# Patient Record
Sex: Female | Born: 1950 | Race: White | Hispanic: No | Smoking: Never smoker
Health system: Southern US, Community
[De-identification: ages and names within clinical notes are randomized; demographics above are authoritative.]

## PROBLEM LIST (undated history)

## (undated) DIAGNOSIS — F419 Anxiety disorder, unspecified: Secondary | ICD-10-CM

## (undated) DIAGNOSIS — D649 Anemia, unspecified: Secondary | ICD-10-CM

## (undated) DIAGNOSIS — I340 Nonrheumatic mitral (valve) insufficiency: Secondary | ICD-10-CM

## (undated) DIAGNOSIS — E119 Type 2 diabetes mellitus without complications: Secondary | ICD-10-CM

## (undated) DIAGNOSIS — K219 Gastro-esophageal reflux disease without esophagitis: Secondary | ICD-10-CM

## (undated) DIAGNOSIS — N183 Chronic kidney disease, stage 3 unspecified: Secondary | ICD-10-CM

## (undated) DIAGNOSIS — F32A Depression, unspecified: Secondary | ICD-10-CM

## (undated) DIAGNOSIS — IMO0001 Reserved for inherently not codable concepts without codable children: Secondary | ICD-10-CM

## (undated) DIAGNOSIS — I10 Essential (primary) hypertension: Secondary | ICD-10-CM

## (undated) DIAGNOSIS — F329 Major depressive disorder, single episode, unspecified: Secondary | ICD-10-CM

## (undated) DIAGNOSIS — E785 Hyperlipidemia, unspecified: Secondary | ICD-10-CM

## (undated) DIAGNOSIS — R319 Hematuria, unspecified: Secondary | ICD-10-CM

## (undated) DIAGNOSIS — Z0389 Encounter for observation for other suspected diseases and conditions ruled out: Secondary | ICD-10-CM

## (undated) HISTORY — DX: Gastro-esophageal reflux disease without esophagitis: K21.9

## (undated) HISTORY — DX: Anemia, unspecified: D64.9

## (undated) HISTORY — DX: Type 2 diabetes mellitus without complications: E11.9

## (undated) HISTORY — PX: HAMMER TOE SURGERY: SHX385

## (undated) HISTORY — DX: Essential (primary) hypertension: I10

## (undated) HISTORY — PX: FRACTURE SURGERY: SHX138

## (undated) HISTORY — DX: Depression, unspecified: F32.A

## (undated) HISTORY — DX: Encounter for observation for other suspected diseases and conditions ruled out: Z03.89

## (undated) HISTORY — DX: Nonrheumatic mitral (valve) insufficiency: I34.0

## (undated) HISTORY — PX: OVARIAN CYST REMOVAL: SHX89

## (undated) HISTORY — DX: Chronic kidney disease, stage 3 (moderate): N18.3

## (undated) HISTORY — PX: COLONOSCOPY: SHX174

## (undated) HISTORY — DX: Major depressive disorder, single episode, unspecified: F32.9

## (undated) HISTORY — DX: Anxiety disorder, unspecified: F41.9

## (undated) HISTORY — DX: Hyperlipidemia, unspecified: E78.5

## (undated) HISTORY — DX: Reserved for inherently not codable concepts without codable children: IMO0001

## (undated) HISTORY — DX: Chronic kidney disease, stage 3 unspecified: N18.30

---

## 1997-12-25 ENCOUNTER — Ambulatory Visit (HOSPITAL_COMMUNITY): Admission: RE | Admit: 1997-12-25 | Discharge: 1997-12-25 | Payer: Self-pay | Admitting: *Deleted

## 1998-10-06 ENCOUNTER — Ambulatory Visit (HOSPITAL_COMMUNITY): Admission: RE | Admit: 1998-10-06 | Discharge: 1998-10-06 | Payer: Self-pay | Admitting: *Deleted

## 2000-03-16 ENCOUNTER — Other Ambulatory Visit: Admission: RE | Admit: 2000-03-16 | Discharge: 2000-03-16 | Payer: Self-pay | Admitting: *Deleted

## 2000-04-11 ENCOUNTER — Encounter: Admission: RE | Admit: 2000-04-11 | Discharge: 2000-04-11 | Payer: Self-pay | Admitting: *Deleted

## 2000-04-11 ENCOUNTER — Encounter: Payer: Self-pay | Admitting: *Deleted

## 2001-04-16 ENCOUNTER — Encounter: Payer: Self-pay | Admitting: *Deleted

## 2001-04-16 ENCOUNTER — Encounter: Admission: RE | Admit: 2001-04-16 | Discharge: 2001-04-16 | Payer: Self-pay | Admitting: *Deleted

## 2001-04-25 ENCOUNTER — Observation Stay (HOSPITAL_COMMUNITY): Admission: AD | Admit: 2001-04-25 | Discharge: 2001-04-26 | Payer: Self-pay | Admitting: Cardiology

## 2001-04-25 ENCOUNTER — Encounter: Payer: Self-pay | Admitting: Cardiology

## 2001-04-26 ENCOUNTER — Encounter: Payer: Self-pay | Admitting: Cardiology

## 2001-11-27 ENCOUNTER — Encounter: Payer: Self-pay | Admitting: Internal Medicine

## 2001-11-27 ENCOUNTER — Encounter: Admission: RE | Admit: 2001-11-27 | Discharge: 2001-11-27 | Payer: Self-pay | Admitting: Internal Medicine

## 2002-06-12 ENCOUNTER — Other Ambulatory Visit: Admission: RE | Admit: 2002-06-12 | Discharge: 2002-06-12 | Payer: Self-pay | Admitting: Obstetrics and Gynecology

## 2003-03-27 ENCOUNTER — Encounter: Payer: Self-pay | Admitting: Obstetrics and Gynecology

## 2003-03-27 ENCOUNTER — Encounter: Admission: RE | Admit: 2003-03-27 | Discharge: 2003-03-27 | Payer: Self-pay | Admitting: Obstetrics and Gynecology

## 2003-07-01 ENCOUNTER — Encounter: Admission: RE | Admit: 2003-07-01 | Discharge: 2003-07-01 | Payer: Self-pay | Admitting: General Practice

## 2003-07-01 ENCOUNTER — Encounter: Payer: Self-pay | Admitting: General Practice

## 2003-07-10 ENCOUNTER — Encounter: Payer: Self-pay | Admitting: Gastroenterology

## 2003-07-10 ENCOUNTER — Encounter: Admission: RE | Admit: 2003-07-10 | Discharge: 2003-07-10 | Payer: Self-pay | Admitting: Gastroenterology

## 2003-07-15 ENCOUNTER — Encounter: Admission: RE | Admit: 2003-07-15 | Discharge: 2003-10-13 | Payer: Self-pay | Admitting: Internal Medicine

## 2003-08-05 ENCOUNTER — Other Ambulatory Visit: Admission: RE | Admit: 2003-08-05 | Discharge: 2003-08-05 | Payer: Self-pay | Admitting: Obstetrics and Gynecology

## 2003-08-11 ENCOUNTER — Ambulatory Visit (HOSPITAL_COMMUNITY): Admission: RE | Admit: 2003-08-11 | Discharge: 2003-08-11 | Payer: Self-pay | Admitting: Gastroenterology

## 2003-08-11 ENCOUNTER — Encounter (INDEPENDENT_AMBULATORY_CARE_PROVIDER_SITE_OTHER): Payer: Self-pay | Admitting: *Deleted

## 2004-04-21 ENCOUNTER — Encounter: Admission: RE | Admit: 2004-04-21 | Discharge: 2004-04-21 | Payer: Self-pay | Admitting: Internal Medicine

## 2004-08-02 ENCOUNTER — Encounter (INDEPENDENT_AMBULATORY_CARE_PROVIDER_SITE_OTHER): Payer: Self-pay | Admitting: *Deleted

## 2004-08-02 ENCOUNTER — Ambulatory Visit (HOSPITAL_COMMUNITY): Admission: RE | Admit: 2004-08-02 | Discharge: 2004-08-02 | Payer: Self-pay | Admitting: Gastroenterology

## 2004-08-10 ENCOUNTER — Observation Stay (HOSPITAL_COMMUNITY): Admission: EM | Admit: 2004-08-10 | Discharge: 2004-08-11 | Payer: Self-pay | Admitting: Emergency Medicine

## 2005-02-03 ENCOUNTER — Other Ambulatory Visit: Admission: RE | Admit: 2005-02-03 | Discharge: 2005-02-03 | Payer: Self-pay | Admitting: Obstetrics and Gynecology

## 2006-05-22 ENCOUNTER — Encounter: Admission: RE | Admit: 2006-05-22 | Discharge: 2006-05-22 | Payer: Self-pay | Admitting: Obstetrics and Gynecology

## 2007-07-04 ENCOUNTER — Encounter: Admission: RE | Admit: 2007-07-04 | Discharge: 2007-07-04 | Payer: Self-pay | Admitting: Obstetrics and Gynecology

## 2007-12-05 ENCOUNTER — Ambulatory Visit (HOSPITAL_COMMUNITY): Admission: RE | Admit: 2007-12-05 | Discharge: 2007-12-05 | Payer: Self-pay | Admitting: Internal Medicine

## 2007-12-18 ENCOUNTER — Encounter: Admission: RE | Admit: 2007-12-18 | Discharge: 2007-12-18 | Payer: Self-pay | Admitting: Gastroenterology

## 2007-12-28 ENCOUNTER — Encounter: Admission: RE | Admit: 2007-12-28 | Discharge: 2007-12-28 | Payer: Self-pay | Admitting: Gastroenterology

## 2008-09-15 ENCOUNTER — Encounter: Admission: RE | Admit: 2008-09-15 | Discharge: 2008-09-15 | Payer: Self-pay | Admitting: Obstetrics and Gynecology

## 2008-12-11 ENCOUNTER — Encounter: Admission: RE | Admit: 2008-12-11 | Discharge: 2008-12-11 | Payer: Self-pay | Admitting: Gastroenterology

## 2009-11-04 ENCOUNTER — Encounter: Admission: RE | Admit: 2009-11-04 | Discharge: 2009-11-04 | Payer: Self-pay | Admitting: Obstetrics and Gynecology

## 2010-05-19 ENCOUNTER — Encounter: Admission: RE | Admit: 2010-05-19 | Discharge: 2010-05-19 | Payer: Self-pay | Admitting: Family Medicine

## 2010-05-21 ENCOUNTER — Encounter: Admission: RE | Admit: 2010-05-21 | Discharge: 2010-05-21 | Payer: Self-pay | Admitting: Family Medicine

## 2010-07-02 ENCOUNTER — Ambulatory Visit (HOSPITAL_COMMUNITY): Admission: RE | Admit: 2010-07-02 | Discharge: 2010-07-02 | Payer: Self-pay | Admitting: Obstetrics and Gynecology

## 2010-11-28 ENCOUNTER — Encounter: Payer: Self-pay | Admitting: Gastroenterology

## 2011-01-21 LAB — GLUCOSE, CAPILLARY: Glucose-Capillary: 162 mg/dL — ABNORMAL HIGH (ref 70–99)

## 2011-01-21 LAB — COMPREHENSIVE METABOLIC PANEL
ALT: 21 U/L (ref 0–35)
AST: 21 U/L (ref 0–37)
Albumin: 3.8 g/dL (ref 3.5–5.2)
Alkaline Phosphatase: 81 U/L (ref 39–117)
BUN: 14 mg/dL (ref 6–23)
CO2: 25 mEq/L (ref 19–32)
Calcium: 9.2 mg/dL (ref 8.4–10.5)
Chloride: 99 mEq/L (ref 96–112)
Creatinine, Ser: 1.21 mg/dL — ABNORMAL HIGH (ref 0.4–1.2)
GFR calc Af Amer: 55 mL/min — ABNORMAL LOW (ref >60)
GFR calc non Af Amer: 45 mL/min — ABNORMAL LOW (ref >60)
Glucose, Bld: 191 mg/dL — ABNORMAL HIGH (ref 70–99)
Potassium: 4.1 mEq/L (ref 3.5–5.1)
Sodium: 134 mEq/L — ABNORMAL LOW (ref 135–145)
Total Bilirubin: 0.2 mg/dL — ABNORMAL LOW (ref 0.3–1.2)
Total Protein: 6.7 g/dL (ref 6.0–8.3)

## 2011-01-21 LAB — CBC
HCT: 38.2 % (ref 36.0–46.0)
Hemoglobin: 12.8 g/dL (ref 12.0–15.0)
MCH: 28 pg (ref 26.0–34.0)
MCHC: 33.6 g/dL (ref 30.0–36.0)
MCV: 83.3 fL (ref 78.0–100.0)
Platelets: 211 10*3/uL (ref 150–400)
RBC: 4.58 MIL/uL (ref 3.87–5.11)
RDW: 13.2 % (ref 11.5–15.5)
WBC: 7.8 10*3/uL (ref 4.0–10.5)

## 2011-03-09 ENCOUNTER — Other Ambulatory Visit: Payer: Self-pay | Admitting: Obstetrics and Gynecology

## 2011-03-09 DIAGNOSIS — Z1231 Encounter for screening mammogram for malignant neoplasm of breast: Secondary | ICD-10-CM

## 2011-03-15 ENCOUNTER — Ambulatory Visit
Admission: RE | Admit: 2011-03-15 | Discharge: 2011-03-15 | Disposition: A | Discharge status: Home / Self Care | Payer: Federal, State, Local not specified - PPO | Source: Ambulatory Visit | Attending: Obstetrics and Gynecology | Admitting: Obstetrics and Gynecology

## 2011-03-15 DIAGNOSIS — Z1231 Encounter for screening mammogram for malignant neoplasm of breast: Secondary | ICD-10-CM

## 2011-03-16 ENCOUNTER — Ambulatory Visit: Payer: Self-pay

## 2011-03-25 NOTE — Op Note (Signed)
NAMEHERBERTA, Abigail [name]                       ACCOUNT NO.:  [id number]   MEDICAL RECORD NO.:  [id number]                   PATIENT TYPE:  AMB   LOCATION:  ENDO                                 FACILITY:  [facility]   PHYSICIAN:  Petra Kuba, M.D.                 DATE OF BIRTH:  04/29/1951   DATE OF PROCEDURE:  08/11/2003  DATE OF DISCHARGE:                                 OPERATIVE REPORT   PROCEDURE PERFORMED:  Colonoscopy.   ENDOSCOPIST:  Petra Kuba, M.D.   INDICATIONS FOR PROCEDURE:  Patient with abdominal pain, nondiagnostic work-  up to date.  Due for colonic screening.  Consent was signed after the risks,  benefits, methods and options were thoroughly discussed in the office.   MEDICINES USED:  Demerol 60 mg, Versed 6 mg.   DESCRIPTION OF PROCEDURE:  Rectal inspection was pertinent for external  hemorrhoids, small.  Digital exam was negative.  A video pediatric  adjustable colonoscope was inserted and with abdominal pressure, able to be  advanced to the cecum.  No position change was needed.  The cecum was  identified by the appendiceal orifice and the ileocecal valve.  In fact, the  scope was inserted a short ways in the terminal ileum which was normal.  Photodocumentation  was obtained.  The scope was slowly withdrawn.  The prep  was adequate.  There was some liquid stool that required washing and  suctioning in the cecal pole.  A small polyp was seen, carefully snared and  on a setting of 15/15 electrocautery was applied and the polyp was removed,  suctioned through the scope and collected in the trap.  In the more proximal  ascending, a moderate-sized polyp was seen, snared, electrocautery applied  and again on a lower setting, polyp was removed, suctioned through the  scope, collected in the trap.  There seemed to be some residual polypoid  tissue at the edges and a few other snares were done, sometimes we cut  through the polyp and these pieces were suctioned through  the scope and  collected in the trap.  Sometimes, we did use a little bit of  electrocautery.  Two small ascending hot and cecal snare polyps.   __________  1. Medium ascending polyps with few snares and hot biopsy of the edge.  2. Otherwise within normal limits to the terminal ileum.   Scope was straightened, air was suctioned, scope removed.  The patient  tolerated the procedure well.  There was no immediate obvious complication.   PLAN:  Await pathology to determine future colonic screening.  Probably  proceed a little sooner than usual based on the ascending polyp, possibly  not being completely removed.  In the meantime, see her back in a month or  two to make sure no further work-up and plans needs to be done for right-  sided pain, possibly even an empiric cholecystectomy, possibly other tests  like CCK __________.                                               Petra Kuba, M.D.    MEM/MEDQ  D:  08/11/2003  T:  08/11/2003  Job:  [id number]

## 2011-03-25 NOTE — H&P (Signed)
Abigail, [name]           ACCOUNT NO.:  [id number]   MEDICAL RECORD NO.:  [id number]          PATIENT TYPE:  OBV   LOCATION:  0382                         FACILITY:  [facility]   PHYSICIAN:  Petra Kuba, M.D.    DATE OF BIRTH:  [birth year]   DATE OF ADMISSION:  08/10/2004  DATE OF DISCHARGE:                                HISTORY & PHYSICAL   HISTORY:  The patient well known to me from a colonoscopy last week where  some right sided polyps were removed with the hot biopsy forceps. She had  done well for a week but yesterday had 8 bloody bowel movements. Today has  only had two. It seemed to be worse with increased either eating, drinking,  or activity. Yesterday, she had some abdominal pain and back pain but  actually does not have that today. She had not been on her usual aspirin a  day or any other nonsteroidals and really has not eaten anything at risk.  She has no history of anemia. Called in with the above complaints, and we  asked her to be evaluated in the emergency room, and after our discussion,  we elected to admit her for observation. She has no history of anemia, and  her hemoglobin here was 10. She was slightly tachycardic. Her blood pressure  was fine.   PAST MEDICAL HISTORY:  Pertinent for increased cholesterol, some reflux,  high blood pressure, all followed by Dr. Elisabeth [name].   FAMILY HISTORY:  Negative except for mother with ulcers and diverticula.   SOCIAL HISTORY:  Pertinent for drug use or alcohol or tobacco and no over-  the-counter medicines currently.   ALLERGIES:  SULFA and KEFLEX.   CURRENT MEDICATIONS:  1.  Lipitor.  2.  Protonix.  3.  Trazodone.  4.  Prozac.  5.  Maxzide.   REVIEW OF SYSTEMS:  Pertinent for no significant weakness, dizziness,  shortness of breath, or other complaints.   PHYSICAL EXAMINATION:  GENERAL:  No acute distress.  VITAL SIGNS:  See chart. Slightly tachycardic.  LUNGS:  Clear.  HEART:  Regular rate and rhythm.  ABDOMEN:  Soft, nontender, good bowel sounds. She had some ____________ red  blood. She has not really seen any clots, initially it was darker.   LABORATORY DATA:  Pertinent for a slight decreased potassium, slight  increased BUN, hemoglobin 10. Normal white count, platelet count, and coags.   ASSESSMENT:  1.  Normal GI bleeding, probably post polypectomy.  2.  High blood pressure.  3.  Diabetes.  4.  Reflux.   PLAN:  Clear liquids only. Follow H and H and her stools. Would transfuse  p.r.n. We rediscussed repeating endoscopy and the transfusion, and she is in  favor of either if needed, with further workup and plans pending clinical  course.      MEM/MEDQ  D:  08/10/2004  T:  08/10/2004  Job:  [id number]   cc:   Lovenia Kim, D.O.  [address]  [place]  [place] [address]  Fax: [phone]

## 2011-03-25 NOTE — Op Note (Signed)
Abigail [name], Abigail [name]           ACCOUNT NO.:  [id number]   MEDICAL RECORD NO.:  [id number]          PATIENT TYPE:  AMB   LOCATION:  ENDO                         FACILITY:  [facility]   PHYSICIAN:  Petra Kuba, M.D.    DATE OF BIRTH:  [birth year]   DATE OF PROCEDURE:  08/02/2004  DATE OF DISCHARGE:                                 OPERATIVE REPORT   PROCEDURE:  Colonoscopy with polypectomy.   INDICATIONS:  Patient with history of multiple ascending and cecal polyps,  due for colonic screening.   Consent was signed after risks, benefits, methods, options thoroughly  discussed multiple times in the past.   MEDICINES USED:  Demerol 50, Versed 5.   PROCEDURE:  Rectal inspection is pertinent for external hemorrhoids.  Digital exam was negative.  The video pediatric adjustable colonoscope was  inserted and with abdominal pressure, easily able to be advanced around the  colon to the cecum.  No abnormalities were seen on insertion.  The cecum was  identified by the appendiceal orifice and the ileocecal valve.  The prep was  adequate.  There was some liquid stool that required washing and suction.  The scope was slowly withdrawn.  In the cecum, two tiny polyps were seen  which were hot biopsied x1, and in the ascending colon, another tiny-to-  small polyp was seen and was hot biopsied x2.  No other abnormalities were  seen.  The scope was then slowly withdrawn throughout the rest of the colon,  and no abnormalities were seen.  On inspecting the rectum, anorectal pull-  through and retroflexion confirmed some small hemorrhoids.  The scope was  straightened and readvanced a short ways up the left side of the colon.  Air  was suctioned, and the scope removed.  The patient tolerated the procedure  well.  There were no obvious immediate complication.   ENDOSCOPIC DIAGNOSES:  1.  Internal and external small hemorrhoids.  2.  Two tiny cecal polyps, hot-biopsied, one tiny small ascending colon   polyp, hot biopsied.  3.  Otherwise within normal limits to the cecum.   PLAN:  Await pathology to determine future colonic screening.  Return to the  care of Dr. Lovenia Kim for the customary health-care maintenance to  include yearly rectals and guaiacs.  Happy to see back sooner or p.r.n.     MEM/MEDQ  D:  08/02/2004  T:  08/02/2004  Job:  [id number]   cc:   Lovenia Kim, D.O.  [address]  [place]  [place] [address]  Fax: [phone]

## 2011-08-29 ENCOUNTER — Emergency Department (HOSPITAL_COMMUNITY)
Admission: EM | Admit: 2011-08-29 | Discharge: 2011-08-30 | Disposition: A | Discharge status: Home / Self Care | Payer: Federal, State, Local not specified - PPO | Attending: Emergency Medicine | Admitting: Emergency Medicine

## 2011-08-29 ENCOUNTER — Emergency Department (HOSPITAL_COMMUNITY): Payer: Federal, State, Local not specified - PPO

## 2011-08-29 DIAGNOSIS — E119 Type 2 diabetes mellitus without complications: Secondary | ICD-10-CM | POA: Insufficient documentation

## 2011-08-29 DIAGNOSIS — R42 Dizziness and giddiness: Secondary | ICD-10-CM | POA: Insufficient documentation

## 2011-08-29 DIAGNOSIS — Z79899 Other long term (current) drug therapy: Secondary | ICD-10-CM | POA: Insufficient documentation

## 2011-08-29 DIAGNOSIS — R0989 Other specified symptoms and signs involving the circulatory and respiratory systems: Secondary | ICD-10-CM | POA: Insufficient documentation

## 2011-08-29 DIAGNOSIS — R059 Cough, unspecified: Secondary | ICD-10-CM | POA: Insufficient documentation

## 2011-08-29 DIAGNOSIS — R0609 Other forms of dyspnea: Secondary | ICD-10-CM | POA: Insufficient documentation

## 2011-08-29 DIAGNOSIS — F341 Dysthymic disorder: Secondary | ICD-10-CM | POA: Insufficient documentation

## 2011-08-29 DIAGNOSIS — I10 Essential (primary) hypertension: Secondary | ICD-10-CM | POA: Insufficient documentation

## 2011-08-29 DIAGNOSIS — R05 Cough: Secondary | ICD-10-CM | POA: Insufficient documentation

## 2011-08-29 DIAGNOSIS — R079 Chest pain, unspecified: Secondary | ICD-10-CM | POA: Insufficient documentation

## 2011-08-29 LAB — CBC
HCT: 33 % — ABNORMAL LOW (ref 36.0–46.0)
Hemoglobin: 10.9 g/dL — ABNORMAL LOW (ref 12.0–15.0)
MCH: 26.5 pg (ref 26.0–34.0)
MCHC: 33 g/dL (ref 30.0–36.0)
MCV: 80.1 fL (ref 78.0–100.0)
Platelets: 195 10*3/uL (ref 150–400)
RBC: 4.12 MIL/uL (ref 3.87–5.11)
RDW: 13 % (ref 11.5–15.5)
WBC: 8.4 10*3/uL (ref 4.0–10.5)

## 2011-08-29 LAB — POCT I-STAT, CHEM 8
BUN: 16 mg/dL (ref 6–23)
Calcium, Ion: 1.13 mmol/L (ref 1.12–1.32)
Chloride: 99 mEq/L (ref 96–112)
Creatinine, Ser: 1.1 mg/dL (ref 0.50–1.10)
Glucose, Bld: 130 mg/dL — ABNORMAL HIGH (ref 70–99)
HCT: 34 % — ABNORMAL LOW (ref 36.0–46.0)
Hemoglobin: 11.6 g/dL — ABNORMAL LOW (ref 12.0–15.0)
Potassium: 3.7 mEq/L (ref 3.5–5.1)
Sodium: 137 mEq/L (ref 135–145)
TCO2: 25 mmol/L (ref 0–100)

## 2011-08-29 LAB — DIFFERENTIAL
Basophils Absolute: 0 10*3/uL (ref 0.0–0.1)
Basophils Relative: 0 % (ref 0–1)
Eosinophils Absolute: 0.2 10*3/uL (ref 0.0–0.7)
Eosinophils Relative: 2 % (ref 0–5)
Lymphocytes Relative: 30 % (ref 12–46)
Lymphs Abs: 2.5 10*3/uL (ref 0.7–4.0)
Monocytes Absolute: 0.7 10*3/uL (ref 0.1–1.0)
Monocytes Relative: 8 % (ref 3–12)
Neutro Abs: 5 10*3/uL (ref 1.7–7.7)
Neutrophils Relative %: 60 % (ref 43–77)

## 2011-08-29 LAB — POCT I-STAT TROPONIN I: Troponin i, poc: 0 ng/mL (ref 0.00–0.08)

## 2011-08-30 LAB — POCT I-STAT TROPONIN I: Troponin i, poc: 0 ng/mL (ref 0.00–0.08)

## 2011-12-26 ENCOUNTER — Other Ambulatory Visit: Payer: Self-pay | Admitting: Family Medicine

## 2011-12-26 MED ORDER — OMEPRAZOLE 20 MG PO CPDR: 20.0000 mg | DELAYED_RELEASE_CAPSULE | Freq: Every day | ORAL | Status: DC

## 2011-12-29 ENCOUNTER — Ambulatory Visit (INDEPENDENT_AMBULATORY_CARE_PROVIDER_SITE_OTHER): Payer: Federal, State, Local not specified - PPO | Admitting: Physician Assistant

## 2011-12-29 DIAGNOSIS — J029 Acute pharyngitis, unspecified: Secondary | ICD-10-CM

## 2011-12-29 DIAGNOSIS — R05 Cough: Secondary | ICD-10-CM

## 2011-12-29 DIAGNOSIS — B009 Herpesviral infection, unspecified: Secondary | ICD-10-CM

## 2011-12-29 MED ORDER — HYDROCOD POLST-CHLORPHEN POLST 10-8 MG/5ML PO LQCR: 5.0000 mL | Freq: Two times a day (BID) | ORAL | Status: DC

## 2011-12-29 MED ORDER — VALACYCLOVIR HCL 1 G PO TABS: 1000.0000 mg | ORAL_TABLET | Freq: Three times a day (TID) | ORAL | Status: DC

## 2011-12-29 NOTE — Progress Notes (Signed)
  Subjective:    Patient ID: Abigail [name], female    DOB: [birth year], 61 y.o.   MRN: [id number]  HPI 2 days ago, sinus congestion, ears, throat, achey.  No f/c. Lesion came up on skin underneath L side of mouth when she started suddenly feeling bad.  Clear runny nose, deep cough. Granddaughter with illness over the weekend with similar symptoms.  Review of Systems  All other systems reviewed and are negative.       Objective:   Physical Exam  Constitutional: She appears well-developed and well-nourished.  HENT:  Head: Normocephalic and atraumatic.  Right Ear: External ear normal.  Left Ear: External ear normal.  Mouth/Throat: Oropharynx is clear and moist. No oropharyngeal exudate (PND, mild erythema).       Nose congested, some clear rhinorrhea  Neck: Normal range of motion. Neck supple. No thyromegaly present.  Cardiovascular: Normal rate, regular rhythm and normal heart sounds.  Exam reveals no gallop and no friction rub.   No murmur heard. Pulmonary/Chest: Effort normal and breath sounds normal. She has no wheezes.  Lymphadenopathy:    She has no cervical adenopathy.  Skin: Skin is warm and dry.       Herpetic type lesion between lower left lip and chin.          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  Advised OTC mucinex D.  Salt water gargles, fluids, rest. Patient requested Zpack.  Do not think appropriate at this time but if not improving in 48-72 hrs, it is ok to call one in.

## 2012-01-01 ENCOUNTER — Telehealth: Payer: Self-pay

## 2012-01-01 MED ORDER — AZITHROMYCIN 250 MG PO TABS: ORAL_TABLET | ORAL | Status: AC

## 2012-01-01 NOTE — Telephone Encounter (Signed)
LMOM that Rx was sent to pharm and to RTC if not any better in a few days

## 2012-01-01 NOTE — Telephone Encounter (Signed)
Zpack has been prescribed to [facility].  RTC if not improving in a few days.

## 2012-01-01 NOTE — Telephone Encounter (Signed)
Pt not feeling any better. Per Abigail [name]'s note ok to call in Zpak if not any better 48-72. Can we do this?

## 2012-01-01 NOTE — Telephone Encounter (Signed)
Pt was seen last Thursday for illness.  She was given one prescription and PA said she would leave script for z pack in patients chart.  Told her if she is not better to call back and we will call in z pack.  Pt states she is not better.  Please call in z pack to [facility] on [address] today.

## 2012-01-21 ENCOUNTER — Other Ambulatory Visit: Payer: Self-pay | Admitting: Family Medicine

## 2012-01-21 MED ORDER — LOSARTAN POTASSIUM 25 MG PO TABS: 25.0000 mg | ORAL_TABLET | Freq: Every day | ORAL | Status: DC

## 2012-02-10 ENCOUNTER — Telehealth: Payer: Self-pay

## 2012-02-10 NOTE — Telephone Encounter (Signed)
.  [facility] The patient called to state that the pharmacy had sent two requests for refill of her Trazadone Rx.  Please call in Rx and call patient at [phone] when Rx called in.

## 2012-02-13 MED ORDER — TRAZODONE HCL 150 MG PO TABS: 150.0000 mg | ORAL_TABLET | Freq: Every day | ORAL | Status: DC

## 2012-02-13 NOTE — Telephone Encounter (Signed)
Patient notified

## 2012-02-13 NOTE — Telephone Encounter (Signed)
None received.  Done.  Abigail [name]

## 2012-02-13 NOTE — Telephone Encounter (Signed)
Chart pulled to PA 

## 2012-03-05 ENCOUNTER — Telehealth: Payer: Self-pay | Admitting: *Deleted

## 2012-03-05 MED ORDER — FLUOXETINE HCL 20 MG PO CAPS: 20.0000 mg | ORAL_CAPSULE | Freq: Every day | ORAL | Status: DC

## 2012-03-05 NOTE — Telephone Encounter (Signed)
Pt pharmacy called pt needs a refill on fluoxetine 20mg .

## 2012-03-05 NOTE — Telephone Encounter (Signed)
Done

## 2012-03-06 ENCOUNTER — Telehealth: Payer: Self-pay

## 2012-03-06 MED ORDER — LOSARTAN POTASSIUM 25 MG PO TABS: 25.0000 mg | ORAL_TABLET | Freq: Every day | ORAL | Status: DC

## 2012-03-06 NOTE — Telephone Encounter (Signed)
Abigail [name] drug calling for refill for losartin 25 mg, cannot get surescripts  To function   Best phone [phone] xnt [phone]

## 2012-03-06 NOTE — Telephone Encounter (Signed)
Done

## 2012-03-11 ENCOUNTER — Ambulatory Visit (INDEPENDENT_AMBULATORY_CARE_PROVIDER_SITE_OTHER): Payer: Federal, State, Local not specified - PPO | Admitting: Emergency Medicine

## 2012-03-11 DIAGNOSIS — I10 Essential (primary) hypertension: Secondary | ICD-10-CM

## 2012-03-11 DIAGNOSIS — E119 Type 2 diabetes mellitus without complications: Secondary | ICD-10-CM

## 2012-03-11 DIAGNOSIS — E7800 Pure hypercholesterolemia, unspecified: Secondary | ICD-10-CM

## 2012-03-11 LAB — POCT CBC
HCT, POC: 40.5 % (ref 37.7–47.9)
Hemoglobin: 13 g/dL (ref 12.2–16.2)
Lymph, poc: 2.9 (ref 0.6–3.4)
MCHC: 32.1 g/dL (ref 31.8–35.4)
MPV: 7.2 fL (ref 0–99.8)
POC Granulocyte: 5.7 (ref 2–6.9)
POC MID %: 4.6 %M (ref 0–12)
RBC: 5.02 M/uL (ref 4.04–5.48)
WBC: 9 10*3/uL (ref 4.6–10.2)

## 2012-03-11 LAB — GLUCOSE, POCT (MANUAL RESULT ENTRY): POC Glucose: 122

## 2012-03-11 MED ORDER — METFORMIN HCL 500 MG PO TABS: ORAL_TABLET | ORAL | Status: DC

## 2012-03-11 MED ORDER — FLUOXETINE HCL 20 MG PO CAPS: 20.0000 mg | ORAL_CAPSULE | Freq: Every day | ORAL | Status: DC

## 2012-03-11 MED ORDER — LOSARTAN POTASSIUM 25 MG PO TABS: 25.0000 mg | ORAL_TABLET | Freq: Every day | ORAL | Status: DC

## 2012-03-11 MED ORDER — TRIAMTERENE-HCTZ 75-50 MG PO TABS: 1.0000 | ORAL_TABLET | ORAL | Status: DC | PRN

## 2012-03-11 MED ORDER — OMEPRAZOLE 20 MG PO CPDR: 20.0000 mg | DELAYED_RELEASE_CAPSULE | Freq: Every day | ORAL | Status: DC

## 2012-03-11 MED ORDER — TRAZODONE HCL 150 MG PO TABS: 150.0000 mg | ORAL_TABLET | Freq: Every day | ORAL | Status: DC

## 2012-03-11 NOTE — Progress Notes (Signed)
  Subjective:    Patient ID: Abigail [name], female    DOB: [birth year], 61 y.o.   MRN: [id number]  HPI patient had a followup of her diabetes. She does not check her sugars regularly home she is under a lot of stress at home to her husband's chronic back pain and requiring large doses of pain medications. She is at times skip weeks of taking her medications and has not look after herself. Sugars in the 300s. She is scheduled to see the eye doctor next week    Review of Systems he denies chest pain shortness of breath or bowel problems.     Objective:   Physical Exam  Constitutional: She appears well-developed and well-nourished.  HENT:  Head: Normocephalic.  Right Ear: External ear normal.  Left Ear: External ear normal.  Eyes: Pupils are equal, round, and reactive to light.  Neck: No JVD present. No tracheal deviation present. No thyromegaly present.  Cardiovascular: Normal rate, regular rhythm and normal heart sounds.  Exam reveals no gallop and no friction rub.   No murmur heard. Pulmonary/Chest: No respiratory distress. She has no wheezes. She has no rales. She exhibits no tenderness.  Abdominal: She exhibits no distension. There is no tenderness. There is no rebound and no guarding.  Lymphadenopathy:    She has no cervical adenopathy.       Assessment & Plan:  Patient in for routine check of her diabetes hypertension and high cholesterol.

## 2012-03-12 LAB — LIPID PANEL
LDL Cholesterol: 74 mg/dL (ref 0–99)
VLDL: 39 mg/dL (ref 0–40)

## 2012-03-12 LAB — COMPREHENSIVE METABOLIC PANEL
ALT: 17 U/L (ref 0–35)
AST: 17 U/L (ref 0–37)
Albumin: 4.5 g/dL (ref 3.5–5.2)
Alkaline Phosphatase: 91 U/L (ref 39–117)
BUN: 20 mg/dL (ref 6–23)
Calcium: 9.3 mg/dL (ref 8.4–10.5)
Chloride: 98 mEq/L (ref 96–112)
Potassium: 4.1 mEq/L (ref 3.5–5.3)
Sodium: 136 mEq/L (ref 135–145)
Total Protein: 7 g/dL (ref 6.0–8.3)

## 2012-03-13 LAB — MICROALBUMIN, URINE: Microalb, Ur: 0.85 mg/dL (ref 0.00–1.89)

## 2012-03-16 NOTE — Telephone Encounter (Signed)
Rx called in for glucometer and testing strips/lancets for 1 year, and mailed pt another copy of her labs. LMOM that this was done.

## 2012-03-16 NOTE — Telephone Encounter (Signed)
Pt would like to have a prescription for a one touch mini sent over to the [facility] on [address]. Pt also states that she was supposed to receive a mailed copy of her labs but she has not received them yet.

## 2012-05-01 ENCOUNTER — Other Ambulatory Visit: Payer: Self-pay | Admitting: Family Medicine

## 2012-05-01 MED ORDER — ATORVASTATIN CALCIUM 10 MG PO TABS: 10.0000 mg | ORAL_TABLET | Freq: Every day | ORAL | Status: DC

## 2012-07-25 ENCOUNTER — Other Ambulatory Visit: Payer: Self-pay | Admitting: Obstetrics and Gynecology

## 2012-10-08 ENCOUNTER — Ambulatory Visit: Payer: Federal, State, Local not specified - PPO

## 2012-10-08 ENCOUNTER — Ambulatory Visit (INDEPENDENT_AMBULATORY_CARE_PROVIDER_SITE_OTHER): Payer: Federal, State, Local not specified - PPO | Admitting: Family Medicine

## 2012-10-08 VITALS — BP 140/90 | HR 88 | Temp 98.1°F | Resp 18 | Wt 167.0 lb

## 2012-10-08 DIAGNOSIS — M542 Cervicalgia: Secondary | ICD-10-CM

## 2012-10-08 DIAGNOSIS — Z23 Encounter for immunization: Secondary | ICD-10-CM

## 2012-10-08 DIAGNOSIS — R5383 Other fatigue: Secondary | ICD-10-CM

## 2012-10-08 DIAGNOSIS — F411 Generalized anxiety disorder: Secondary | ICD-10-CM

## 2012-10-08 DIAGNOSIS — F419 Anxiety disorder, unspecified: Secondary | ICD-10-CM | POA: Insufficient documentation

## 2012-10-08 DIAGNOSIS — E119 Type 2 diabetes mellitus without complications: Secondary | ICD-10-CM | POA: Insufficient documentation

## 2012-10-08 DIAGNOSIS — E785 Hyperlipidemia, unspecified: Secondary | ICD-10-CM

## 2012-10-08 DIAGNOSIS — R079 Chest pain, unspecified: Secondary | ICD-10-CM

## 2012-10-08 LAB — COMPREHENSIVE METABOLIC PANEL
ALT: 16 U/L (ref 0–35)
AST: 15 U/L (ref 0–37)
Albumin: 4.3 g/dL (ref 3.5–5.2)
CO2: 29 mEq/L (ref 19–32)
Calcium: 9 mg/dL (ref 8.4–10.5)
Chloride: 100 mEq/L (ref 96–112)
Creat: 1.12 mg/dL — ABNORMAL HIGH (ref 0.50–1.10)
Potassium: 4.4 mEq/L (ref 3.5–5.3)

## 2012-10-08 LAB — POCT CBC
HCT, POC: 41.5 % (ref 37.7–47.9)
Hemoglobin: 13.2 g/dL (ref 12.2–16.2)
Lymph, poc: 2.1 (ref 0.6–3.4)
MCH, POC: 26.2 pg — AB (ref 27–31.2)
MCHC: 31.8 g/dL (ref 31.8–35.4)
MPV: 6.9 fL (ref 0–99.8)
RBC: 5.04 M/uL (ref 4.04–5.48)
WBC: 7.1 10*3/uL (ref 4.6–10.2)

## 2012-10-08 LAB — POCT GLYCOSYLATED HEMOGLOBIN (HGB A1C): Hemoglobin A1C: 7.4

## 2012-10-08 LAB — TSH: TSH: 2.033 u[IU]/mL (ref 0.350–4.500)

## 2012-10-08 LAB — LIPID PANEL
Cholesterol: 181 mg/dL (ref 0–200)
Total CHOL/HDL Ratio: 3.2 Ratio

## 2012-10-08 MED ORDER — ATORVASTATIN CALCIUM 10 MG PO TABS: 10.0000 mg | ORAL_TABLET | Freq: Every day | ORAL | Status: DC

## 2012-10-08 NOTE — Progress Notes (Signed)
[facility] [address] [place] [address] [phone]  Date:  10/08/2012   Name:  Abigail [name]   DOB:  02/03/1951   MRN:  [id number]  PCP:  No primary provider on file.    Chief Complaint: labwork, Dizziness, Fatigue and Neck Pain   History of Present Illness:  Abigail [name] is a 61 y.o. very pleasant female patient who presents with the following:  Here today for a follow- up visit; she suffers from DM and needs her A1c and lipids checked today. Last A1c done in May-- was 9%.  We also checked her CHL then, which looked ok except her triglycerides were 196.  For the lasts couple of months she has felt "tired and sluggish," and has noted that her neck has hurt.  She feels tired "no mater how much rest I get."  She also can feel dizzy if she moves her neck from extension to flexion.  This dizziness is nor severe (no syncope or falls) but she does notice it.    She feels tired, but not somnolent during the day. No problems with nodding off during the day.    Her sugars "have not been good." Tend to be over 200 nearly always.    She also saw her OB a couple of months ago and was told that her blood count was a little bit low. She is due for her mammogram.  She is post- menopausal and does not have any bleeding.   [place] thinks that her colonoscopy is UTD but she is not sure of the exact date.    She also has noted some CP from her sub-sternal area through to her back intermittently for about 6 weeks.  It does not occur with exercise, but can occur with stress.  She is not sure if it could be heartburn.  It can last from a few minutes to 30 minutes.    There is no problem list on file for this patient.   Past Medical History  Diagnosis Date  . Diabetes mellitus   . Hypertension   . Hyperlipidemia   . Anxiety     Past Surgical History  Procedure Date  . Ovarian cyst removal   . Hammer toe surgery     History  Substance Use Topics  .  Smoking status: Never Smoker   . Smokeless tobacco: Not on file  . Alcohol Use: 0.6 oz/week    1 Glasses of wine per week    Family History  Problem Relation Age of Onset  . Diabetes Mother   . Diabetes Father   . Heart disease Father   . Heart disease Brother     Allergies  Allergen Reactions  . Cephalexin Hives  . Sulfa Antibiotics Other (See Comments)    Unknown by pt    Medication list has been reviewed and updated.  Current Outpatient Prescriptions on File Prior to Visit  Medication Sig Dispense Refill  . aspirin 81 MG tablet Take 81 mg by mouth as needed.      [name] atorvastatin (LIPITOR) 10 MG tablet Take 1 tablet (10 mg total) by mouth daily.  90 tablet  1  . FLUoxetine (PROZAC) 20 MG capsule Take 1 capsule (20 mg total) by mouth daily.  90 capsule  3  . losartan (COZAAR) 25 MG tablet Take 1 tablet (25 mg total) by mouth daily.  90 tablet  3  . metFORMIN (GLUCOPHAGE) 500 MG tablet Take 2 tablets  twice a day  360 tablet  3  . OMEGA-3 KRILL OIL PO Take 1 capsule by mouth 2 (two) times daily.      [name] omeprazole (PRILOSEC) 20 MG capsule Take 1 capsule (20 mg total) by mouth daily.  90 capsule  3  . traZODone (DESYREL) 150 MG tablet Take 1 tablet (150 mg total) by mouth at bedtime.  90 tablet  3  . triamterene-hydrochlorothiazide (MAXZIDE) 75-50 MG per tablet Take 1 tablet by mouth as needed.  90 tablet  3  . vitamin B-12 (CYANOCOBALAMIN) 100 MCG tablet Take 100 mcg by mouth daily.        Review of Systems:  As per HPI- otherwise negative.   Physical Examination: There were no vitals filed for this visit. Filed Vitals:   10/08/12 0808  Weight: 167 lb (75.751 kg)   There is no height on file to calculate BMI. Ideal Body Weight:    GEN: WDWN, NAD, Non-toxic, A & O x 3 HEENT: Atraumatic, Normocephalic. Neck supple. No masses, No LAD. Bilateral TM wnl, oropharynx normal.  PEERL,EOMI.   She is tender and tight through her bilateral trapezius muscles.  Otherwise her neck  exam is normal, normal ROM, no carotic bruit Ears and Nose: No external deformity. CV: RRR, No M/G/R. No JVD. No thrill. No extra heart sounds. PULM: CTA B, no wheezes, crackles, rhonchi. No retractions. No resp. distress. No accessory muscle use. ABD: S, NT, ND, +BS. No rebound. No HSM. EXTR: No c/c/e NEURO Normal gait.  PSYCH: Normally interactive. Conversant. Not depressed or anxious appearing.  Calm demeanor.   [facility] reading (PRIMARY) by  Dr. Patsy Lager.  C spine:  Mild to moderate degenerative change especially C4- C5  CERVICAL SPINE - 2-3 VIEW  Comparison: None.  Findings: Cervical spine as well visualized to the cervicothoracic  junction. No evidence of acute fracture, or malalignment.  Vertebral body heights and intervertebral disc spaces are  maintained. There is no prevertebral soft tissue swelling. There  is focal spondylosis at C5-C6 with uncovertebral joint hypertrophy  and posterior osteophyte formation.  IMPRESSION:  Focal cervical spondylosis at C5-C6 with left greater than right  uncovertebral joint hypertrophy and posterior osteophyte formation.    Results for orders placed in visit on 10/08/12  POCT CBC      Component Value Range   WBC 7.1  4.6 - 10.2 K/uL   Lymph, poc 2.1  0.6 - 3.4   POC LYMPH PERCENT 30.2  10 - 50 %L   MID (cbc) 0.5  0 - 0.9   POC MID % 6.4  0 - 12 %M   POC Granulocyte 4.5  2 - 6.9   Granulocyte percent 63.4  37 - 80 %G   RBC 5.04  4.04 - 5.48 M/uL   Hemoglobin 13.2  12.2 - 16.2 g/dL   HCT, POC 96.0  45.4 - 47.9 %   MCV 82.3  80 - 97 fL   MCH, POC 26.2 (*) 27 - 31.2 pg   MCHC 31.8  31.8 - 35.4 g/dL   RDW, POC 09.8     Platelet Count, POC 291  142 - 424 K/uL   MPV 6.9  0 - 99.8 fL  POCT GLYCOSYLATED HEMOGLOBIN (HGB A1C)      Component Value Range   Hemoglobin A1C 7.4     A1c in May was 9%- improvement  EKG:  NSR, compared to old EKG and no change  Assessment and Plan: 1. Diabetes  POCT glycosylated hemoglobin (  Hb A1C), EKG 12-Lead    2. Anxiety    3. Chest pain  EKG 12-Lead, Ambulatory referral to Cardiology  4. Fatigue  POCT CBC, Comprehensive metabolic panel, TSH, EKG 12-Lead  5. Hyperlipidemia  Lipid panel, atorvastatin (LIPITOR) 10 MG tablet  6. Neck pain  DG Cervical Spine 2-3 Views  7. Need for prophylactic vaccination and inoculation against influenza  Flu vaccine greater than or equal to 3yo preservative free IM   DM control is good today.  Await FLP.  Gave flu shot.  Refilled lipitor.   Vrinda is having atypical CP and some dizziness with neck flexion.  Will refer her to cardiology as she does have risk factors and may need a stress test.  A lot of her neck pain seems muscular.  She would like to try chiropractic care which is a reasonable idea. If this is not helpful she will let me know.  Await her TSH to further evaluate her fatigue.  This may be another reason to consider a stress test.  Continue daily aspirin  Will plan further follow- up pending labs.   Abbe Amsterdam, MD

## 2012-10-08 NOTE — Patient Instructions (Addendum)
We will set you up to see cardiology and also will follow- up with your labs

## 2012-10-09 ENCOUNTER — Encounter: Payer: Self-pay | Admitting: Family Medicine

## 2012-10-09 NOTE — Addendum Note (Signed)
Addended by: Abbe Amsterdam C on: 10/09/2012 04:08 AM   Modules accepted: Orders

## 2012-10-11 ENCOUNTER — Ambulatory Visit: Payer: Federal, State, Local not specified - PPO | Admitting: Cardiovascular Disease

## 2012-10-23 ENCOUNTER — Ambulatory Visit (INDEPENDENT_AMBULATORY_CARE_PROVIDER_SITE_OTHER): Payer: Federal, State, Local not specified - PPO | Admitting: Cardiovascular Disease

## 2012-10-23 ENCOUNTER — Encounter: Payer: Self-pay | Admitting: Cardiovascular Disease

## 2012-10-23 VITALS — BP 134/90 | HR 100 | Ht 65.0 in | Wt 168.0 lb

## 2012-10-23 DIAGNOSIS — M549 Dorsalgia, unspecified: Secondary | ICD-10-CM

## 2012-10-23 DIAGNOSIS — R079 Chest pain, unspecified: Secondary | ICD-10-CM | POA: Insufficient documentation

## 2012-10-23 NOTE — Progress Notes (Signed)
Patient ID: Abigail [name], female   DOB: 06/19/1951, 61 y.o.   MRN: [id number] 61 yo diabetic referred by Dr Dallas Schimke for chest pain from her sub-sternal area through to her back intermittently for about 6 weeks. It does not occur with exercise, but can occur with stress. She is not sure if it could be heartburn. It can last from a few minutes to 30 minutes. Pain not related to exercise.  Walks 20 minutes a day with no issue.  Sometimes pain starts in epigastric area and goes to back Not related to food.  Has not had a CXR in over a year.  A1c better but has been as high as 9.  Pain in back can be positional and she sits at a desk a lot  ROS: Denies fever, malais, weight loss, blurry vision, decreased visual acuity, cough, sputum, SOB, hemoptysis, pleuritic pain, palpitaitons, heartburn, abdominal pain, melena, lower extremity edema, claudication, or rash.  All other systems reviewed and negative   General: Affect appropriate Healthy:  appears stated age HEENT: normal Neck supple with no adenopathy JVP normal no bruits no thyromegaly Lungs clear with no wheezing and good diaphragmatic motion Heart:  S1/S2 no murmur,rub, gallop or click PMI normal Abdomen: benighn, BS positve, no tenderness, no AAA no bruit.  No HSM or HJR Distal pulses intact with no bruits No edema Neuro non-focal Skin warm and dry No muscular weakness  Medications Current Outpatient Prescriptions  Medication Sig Dispense Refill  . aspirin 81 MG tablet Take 81 mg by mouth as needed.      [name] atorvastatin (LIPITOR) 10 MG tablet Take 1 tablet (10 mg total) by mouth daily.  90 tablet  3  . FLUoxetine (PROZAC) 20 MG capsule Take 1 capsule (20 mg total) by mouth daily.  90 capsule  3  . losartan (COZAAR) 25 MG tablet Take 1 tablet (25 mg total) by mouth daily.  90 tablet  3  . metFORMIN (GLUCOPHAGE) 500 MG tablet Take 2 tablets twice a day  360 tablet  3  . OMEGA-3 KRILL OIL PO Take 1 capsule by mouth 2 (two) times  daily.      [name] omeprazole (PRILOSEC) 20 MG capsule Take 1 capsule (20 mg total) by mouth daily.  90 capsule  3  . traZODone (DESYREL) 150 MG tablet Take 1 tablet (150 mg total) by mouth at bedtime.  90 tablet  3  . triamterene-hydrochlorothiazide (MAXZIDE) 75-50 MG per tablet Take 1 tablet by mouth as needed.  90 tablet  3  . vitamin B-12 (CYANOCOBALAMIN) 100 MCG tablet Take 100 mcg by mouth daily.        Allergies Cephalexin and Sulfa antibiotics  Family History: Family History  Problem Relation Age of Onset  . Diabetes Mother   . Diabetes Father   . Heart disease Father   . Heart disease Brother     Social History: History   Social History  . Marital Status: Married    Spouse Name: N/A    Number of Children: N/A  . Years of Education: N/A   Occupational History  . Not on file.   Social History Main Topics  . Smoking status: Never Smoker   . Smokeless tobacco: Not on file  . Alcohol Use: 0.6 oz/week    1 Glasses of wine per week  . Drug Use: No  . Sexually Active: Yes    Birth Control/ Protection: None   Other Topics Concern  . Not on file  Social History Narrative  . No narrative on file    Electrocardiogram:  NSR rate 86 12/2 normal ECG  Assessment and Plan

## 2012-10-23 NOTE — Patient Instructions (Addendum)
Your physician recommends that you schedule a follow-up appointment in: AS NEEDED  Your physician recommends that you continue on your current medications as directed. Please refer to the Current Medication list given to you today. A chest x-ray takes a picture of the organs and structures inside the chest, including the heart, lungs, and blood vessels. This test can show several things, including, whether the heart is enlarges; whether fluid is building up in the lungs; and whether pacemaker / defibrillator leads are still in place. DX  CHEST AND BACK PAIN  Your physician has requested that you have an exercise tolerance test. For further information please visit [email or web address] Please also follow instruction sheet, as given. DX CHEST PAIN

## 2012-10-23 NOTE — Assessment & Plan Note (Signed)
Discussed low carb diet.  Target hemoglobin A1c is 6.5 or less.  Continue current medications.  

## 2012-10-23 NOTE — Assessment & Plan Note (Signed)
Atypical but has DM.  Normal ECG  F/U exercise treadmill  Also check CXR to make sure mediastinum and aorta not enlarged

## 2012-10-30 ENCOUNTER — Telehealth: Payer: Self-pay

## 2012-10-30 MED ORDER — TRAZODONE HCL 150 MG PO TABS: 150.0000 mg | ORAL_TABLET | Freq: Every day | ORAL | Status: DC

## 2012-10-30 NOTE — Telephone Encounter (Signed)
Sent med to pharmacy  

## 2012-10-30 NOTE — Telephone Encounter (Signed)
Patient would like to get a refill on her trazodone    Pharmacy: [facility] on [address]   Best#: [phone]

## 2012-10-30 NOTE — Telephone Encounter (Signed)
Pt notified that rx has been sent in. 

## 2012-11-08 ENCOUNTER — Ambulatory Visit (INDEPENDENT_AMBULATORY_CARE_PROVIDER_SITE_OTHER): Payer: Federal, State, Local not specified - PPO | Admitting: Cardiovascular Disease

## 2012-11-08 ENCOUNTER — Encounter: Payer: Self-pay | Admitting: Cardiovascular Disease

## 2012-11-08 VITALS — BP 122/82 | HR 109 | Ht 65.0 in | Wt 168.2 lb

## 2012-11-08 DIAGNOSIS — R079 Chest pain, unspecified: Secondary | ICD-10-CM

## 2012-11-08 NOTE — Procedures (Signed)
    Treadmill Stress test  Indication: Atypical chest pain.  Baseline Data:  Resting EKG shows NSR with rate of 106 bpm, no significant ST changes. Resting blood pressure of 122/82 mm Hg Stand bruce protocal was used.  Exercise Data:  Patient exercised for 6 min 0 sec,  Peak heart rate of 154 bpm.  This was 99 % of the maximum predicted heart rate. No symptoms of chest pain or lightheadedness were reported at peak stress or in recovery.  Peak Blood pressure recorded was 160/92 Maximal work level: 7 METs.  BP response: Normal HR response: Normal  EKG with Exercise: Sinus tachycardia with no significant ST changes.  FINAL IMPRESSION: Normal exercise stress test. No significant EKG changes concerning for ischemia. Average exercise tolerance.

## 2012-11-08 NOTE — Patient Instructions (Addendum)
Your stress test is normal.

## 2012-11-16 ENCOUNTER — Ambulatory Visit (INDEPENDENT_AMBULATORY_CARE_PROVIDER_SITE_OTHER)
Admission: RE | Admit: 2012-11-16 | Discharge: 2012-11-16 | Disposition: A | Discharge status: Home / Self Care | Payer: Federal, State, Local not specified - PPO | Source: Ambulatory Visit | Attending: Cardiovascular Disease | Admitting: Cardiovascular Disease

## 2012-11-16 DIAGNOSIS — M549 Dorsalgia, unspecified: Secondary | ICD-10-CM

## 2012-11-16 DIAGNOSIS — R079 Chest pain, unspecified: Secondary | ICD-10-CM

## 2013-01-26 ENCOUNTER — Telehealth: Payer: Self-pay

## 2013-01-26 NOTE — Telephone Encounter (Signed)
Pt would like a refill on trazedone, pt states the pharmacy informed her that they have sent over a request twice. Please advise pt is currently at pharmacy.  [facility] [phone] on [address] [facility] to [facility]

## 2013-01-27 ENCOUNTER — Telehealth: Payer: Self-pay

## 2013-01-27 MED ORDER — TRAZODONE HCL 150 MG PO TABS: 150.0000 mg | ORAL_TABLET | Freq: Every day | ORAL | Status: DC

## 2013-01-27 NOTE — Telephone Encounter (Signed)
Sent rx to pharmacy.  Pt due for follow up on DM, HTN.  Last seen Dec 2013

## 2013-01-28 ENCOUNTER — Other Ambulatory Visit: Payer: Self-pay | Admitting: Gastroenterology

## 2013-01-28 NOTE — Telephone Encounter (Signed)
ERROR

## 2013-01-28 NOTE — Telephone Encounter (Signed)
Patient advised.

## 2013-02-25 ENCOUNTER — Ambulatory Visit (INDEPENDENT_AMBULATORY_CARE_PROVIDER_SITE_OTHER): Payer: Federal, State, Local not specified - PPO | Admitting: Family Medicine

## 2013-02-25 ENCOUNTER — Encounter: Payer: Self-pay | Admitting: Family Medicine

## 2013-02-25 VITALS — BP 130/70 | HR 96 | Temp 98.0°F | Resp 16 | Ht 65.25 in | Wt 168.0 lb

## 2013-02-25 DIAGNOSIS — E7800 Pure hypercholesterolemia, unspecified: Secondary | ICD-10-CM

## 2013-02-25 DIAGNOSIS — E119 Type 2 diabetes mellitus without complications: Secondary | ICD-10-CM

## 2013-02-25 DIAGNOSIS — J3489 Other specified disorders of nose and nasal sinuses: Secondary | ICD-10-CM

## 2013-02-25 DIAGNOSIS — K219 Gastro-esophageal reflux disease without esophagitis: Secondary | ICD-10-CM

## 2013-02-25 DIAGNOSIS — F329 Major depressive disorder, single episode, unspecified: Secondary | ICD-10-CM

## 2013-02-25 DIAGNOSIS — I10 Essential (primary) hypertension: Secondary | ICD-10-CM

## 2013-02-25 DIAGNOSIS — R0981 Nasal congestion: Secondary | ICD-10-CM

## 2013-02-25 DIAGNOSIS — E785 Hyperlipidemia, unspecified: Secondary | ICD-10-CM

## 2013-02-25 LAB — POCT CBC
Granulocyte percent: 64.5 %G (ref 37–80)
Hemoglobin: 12.3 g/dL (ref 12.2–16.2)
MCH, POC: 25.7 pg — AB (ref 27–31.2)
MCV: 82 fL (ref 80–97)
MID (cbc): 0.5 (ref 0–0.9)
Platelet Count, POC: 269 10*3/uL (ref 142–424)
RBC: 4.79 M/uL (ref 4.04–5.48)
WBC: 7 10*3/uL (ref 4.6–10.2)

## 2013-02-25 LAB — LIPID PANEL
HDL: 55 mg/dL (ref >39)
LDL Cholesterol: 100 mg/dL — ABNORMAL HIGH (ref 0–99)
Total CHOL/HDL Ratio: 3.5 Ratio
Triglycerides: 185 mg/dL — ABNORMAL HIGH (ref <150)
VLDL: 37 mg/dL (ref 0–40)

## 2013-02-25 MED ORDER — FLUTICASONE PROPIONATE 50 MCG/ACT NA SUSP: 2.0000 | Freq: Every day | NASAL | Status: DC

## 2013-02-25 MED ORDER — LOSARTAN POTASSIUM 25 MG PO TABS: 25.0000 mg | ORAL_TABLET | Freq: Every day | ORAL | Status: DC

## 2013-02-25 MED ORDER — OMEPRAZOLE 20 MG PO CPDR: 20.0000 mg | DELAYED_RELEASE_CAPSULE | Freq: Every day | ORAL | Status: DC

## 2013-02-25 MED ORDER — FLUOXETINE HCL 20 MG PO CAPS: 20.0000 mg | ORAL_CAPSULE | Freq: Every day | ORAL | Status: DC

## 2013-02-25 MED ORDER — DOXYCYCLINE HYCLATE 100 MG PO TABS: 100.0000 mg | ORAL_TABLET | Freq: Two times a day (BID) | ORAL | Status: DC

## 2013-02-25 MED ORDER — TRAZODONE HCL 150 MG PO TABS: 150.0000 mg | ORAL_TABLET | Freq: Every day | ORAL | Status: DC

## 2013-02-25 NOTE — Progress Notes (Addendum)
[facility] [address] [place] [address] [phone]  Date:  02/25/2013   Name:  Abigail [name]   DOB:  15-Mar-1951   MRN:  [id number]  PCP:  No primary provider on file.    Chief Complaint: Nasal Congestion and Medication Refill   History of Present Illness:  Abigail [name] is a 62 y.o. very pleasant female patient who presents with the following:  Last A1c in December 2013 was 7.4%.  Admits she has dropped off her walking program over the last 2 months due to bad weather. She continues to try and lose weight, and does not think she has gained any weight.  Her home glucose is always around 170 -180 recently.    She is here today with possible illness.   She started on zyrtec with the onset of pollen this season.  Then about 11 days ago she developed a scratchy throat, stuffy ears, cough and some pain in her back from coughing, runny nose.  The cough is productive of yellow mucus. She has not measured a fever.  She seemed to be getting better, but them noted a worsening of her symptoms yesterday and felt tired.  She does not have any sinus pain but does have a lot of congestion  She continues to get some nausea with metformin, although she has been taking it for a long time.  She has also been on lipitor for a long time and wonders if she should switch to crestor- "maybe my body is getting used to it."   Patient Active Problem List  Diagnosis  . Diabetes  . Anxiety  . Chest pain    Past Medical History  Diagnosis Date  . Diabetes mellitus   . Hypertension   . Hyperlipidemia   . Anxiety     Past Surgical History  Procedure Laterality Date  . Ovarian cyst removal    . Hammer toe surgery      History  Substance Use Topics  . Smoking status: Never Smoker   . Smokeless tobacco: Not on file  . Alcohol Use: 0.6 oz/week    1 Glasses of wine per week    Family History  Problem Relation Age of Onset  . Diabetes Mother   . Diabetes  Father   . Heart disease Father   . Heart disease Brother     Allergies  Allergen Reactions  . Cephalexin Hives  . Sulfa Antibiotics Other (See Comments)    Unknown by pt    Medication list has been reviewed and updated.  Current Outpatient Prescriptions on File Prior to Visit  Medication Sig Dispense Refill  . aspirin 81 MG tablet Take 81 mg by mouth as needed.      [name] atorvastatin (LIPITOR) 10 MG tablet Take 1 tablet (10 mg total) by mouth daily.  90 tablet  3  . FLUoxetine (PROZAC) 20 MG capsule Take 1 capsule (20 mg total) by mouth daily.  90 capsule  3  . losartan (COZAAR) 25 MG tablet Take 1 tablet (25 mg total) by mouth daily.  90 tablet  3  . metFORMIN (GLUCOPHAGE) 500 MG tablet Take 2 tablets twice a day  360 tablet  3  . OMEGA-3 KRILL OIL PO Take 1 capsule by mouth 2 (two) times daily.      [name] omeprazole (PRILOSEC) 20 MG capsule Take 1 capsule (20 mg total) by mouth daily.  90 capsule  3  . traZODone (DESYREL) 150 MG  tablet Take 1 tablet (150 mg total) by mouth at bedtime.  30 tablet  0  . triamterene-hydrochlorothiazide (MAXZIDE) 75-50 MG per tablet Take 1 tablet by mouth as needed.  90 tablet  3  . vitamin B-12 (CYANOCOBALAMIN) 100 MCG tablet Take 100 mcg by mouth daily.       No current facility-administered medications on file prior to visit.    Review of Systems:  As per HPI- otherwise negative.   Physical Examination: Filed Vitals:   02/25/13 0942  BP: 130/70  Pulse: 110  Temp: 98 F (36.7 C)  Resp: 16   Filed Vitals:   02/25/13 0942  Height: 5' 5.25" (1.657 m)  Weight: 168 lb (76.204 kg)   Body mass index is 27.75 kg/(m^2). Ideal Body Weight: Weight in (lb) to have BMI = 25: 151.1  GEN: WDWN, NAD, Non-toxic, A & O x 3 HEENT: Atraumatic, Normocephalic. Neck supple. No masses, No LAD.  Bilateral TM wnl, oropharynx normal.  PEERL,EOMI.   Nasal congestion Ears and Nose: No external deformity. CV: RRR, No M/G/R. No JVD. No thrill. No extra heart  sounds. PULM: CTA B, no wheezes, crackles, rhonchi. No retractions. No resp. distress. No accessory muscle use. EXTR: No c/c/e NEURO Normal gait.  PSYCH: Normally interactive. Conversant. Not depressed or anxious appearing.  Calm demeanor.   Results for orders placed in visit on 02/25/13  POCT GLYCOSYLATED HEMOGLOBIN (HGB A1C)      Result Value Range   Hemoglobin A1C 8.8    POCT CBC      Result Value Range   WBC 7.0  4.6 - 10.2 K/uL   Lymph, poc 2.0  0.6 - 3.4   POC LYMPH PERCENT 28.1  10 - 50 %L   MID (cbc) 0.5  0 - 0.9   POC MID % 7.4  0 - 12 %M   POC Granulocyte 4.5  2 - 6.9   Granulocyte percent 64.5  37 - 80 %G   RBC 4.79  4.04 - 5.48 M/uL   Hemoglobin 12.3  12.2 - 16.2 g/dL   HCT, POC 16.1  09.6 - 47.9 %   MCV 82.0  80 - 97 fL   MCH, POC 25.7 (*) 27 - 31.2 pg   MCHC 31.3 (*) 31.8 - 35.4 g/dL   RDW, POC 04.5     Platelet Count, POC 269  142 - 424 K/uL   MPV 6.7  0 - 99.8 fL     Assessment and Plan: Type II or unspecified type diabetes mellitus without mention of complication, not stated as uncontrolled - Plan: POCT glycosylated hemoglobin (Hb A1C), Lipid panel, POCT glycosylated hemoglobin (Hb A1C), Comprehensive metabolic panel  Unspecified essential hypertension - Plan: losartan (COZAAR) 25 MG tablet  Depression - Plan: FLUoxetine (PROZAC) 20 MG capsule, traZODone (DESYREL) 150 MG tablet  GERD (gastroesophageal reflux disease) - Plan: omeprazole (PRILOSEC) 20 MG capsule  Nasal congestion - Plan: POCT CBC, fluticasone (FLONASE) 50 MCG/ACT nasal spray, doxycycline (VIBRA-TABS) 100 MG tablet  DM control is not as good as it has been- likely due to decreased exercise.  She plans to get back on her program and recheck in 2 or 3 months  Add flonase to her regimen, continue zyrtec. She may have a sinus infection- 10 days of illness with a "double sickening." will treat with doxycycline.  She is to let me know if not better in the next few days.  No SOB and her pulse is  typically slightly elevated.  Refilled her medications as requested  Signed Abbe Amsterdam, MD   4/22  Called her and went over her cholesterol numbers.  Her cholesterol is certainly not bad, but for diabetics we do want to push her LDL lower.  She will try doubling up on her lipitor for a total of 20 mg a day, and we will plan to recheck her FLP when we check her A1c in about 2 months

## 2013-02-25 NOTE — Patient Instructions (Addendum)
Use the flonase daily for your allergies.  The doxycycline is for any sinus infection and/ or bronchitis.  Let me know if you are not feeling better in the next few days.    Please work on getting back to your walking/ exercise program.  I will put an order on your chart for a recheck A1c- please come and have this done in about 2 months.

## 2013-02-26 ENCOUNTER — Telehealth: Payer: Self-pay

## 2013-02-26 MED ORDER — ATORVASTATIN CALCIUM 20 MG PO TABS: 20.0000 mg | ORAL_TABLET | Freq: Every day | ORAL | Status: DC

## 2013-02-26 NOTE — Telephone Encounter (Signed)
faxed

## 2013-02-26 NOTE — Addendum Note (Signed)
Addended by: Abbe Amsterdam C on: 02/26/2013 08:50 PM   Modules accepted: Orders

## 2013-02-26 NOTE — Telephone Encounter (Signed)
Pt states she needs oow note faxed to her office at [phone] Monday only)   Best [phone]

## 2013-04-04 ENCOUNTER — Encounter: Payer: Self-pay | Admitting: Family Medicine

## 2013-07-15 ENCOUNTER — Other Ambulatory Visit: Payer: Self-pay | Admitting: Family Medicine

## 2013-07-17 NOTE — Telephone Encounter (Signed)
Needs ov, labs 

## 2013-07-24 ENCOUNTER — Ambulatory Visit (INDEPENDENT_AMBULATORY_CARE_PROVIDER_SITE_OTHER): Payer: Federal, State, Local not specified - PPO | Admitting: Endocrinology

## 2013-07-24 ENCOUNTER — Encounter: Payer: Self-pay | Admitting: Endocrinology

## 2013-07-24 ENCOUNTER — Other Ambulatory Visit: Payer: Self-pay | Admitting: *Deleted

## 2013-07-24 VITALS — BP 116/68 | HR 90 | Temp 98.5°F | Resp 12 | Ht 65.0 in | Wt 173.5 lb

## 2013-07-24 DIAGNOSIS — E785 Hyperlipidemia, unspecified: Secondary | ICD-10-CM

## 2013-07-24 DIAGNOSIS — IMO0001 Reserved for inherently not codable concepts without codable children: Secondary | ICD-10-CM

## 2013-07-24 DIAGNOSIS — E1169 Type 2 diabetes mellitus with other specified complication: Secondary | ICD-10-CM | POA: Insufficient documentation

## 2013-07-24 LAB — COMPREHENSIVE METABOLIC PANEL
ALT: 22 U/L (ref 0–35)
AST: 22 U/L (ref 0–37)
Albumin: 3.9 g/dL (ref 3.5–5.2)
Alkaline Phosphatase: 76 U/L (ref 39–117)
BUN: 14 mg/dL (ref 6–23)
Chloride: 98 mEq/L (ref 96–112)
Creatinine, Ser: 1 mg/dL (ref 0.4–1.2)
Potassium: 4.2 mEq/L (ref 3.5–5.1)

## 2013-07-24 LAB — MICROALBUMIN / CREATININE URINE RATIO
Creatinine,U: 102.9 mg/dL
Microalb Creat Ratio: 2.9 mg/g (ref 0.0–30.0)
Microalb, Ur: 3 mg/dL — ABNORMAL HIGH (ref 0.0–1.9)

## 2013-07-24 MED ORDER — LIRAGLUTIDE 18 MG/3ML ~~LOC~~ SOPN: 1.2000 mg | PEN_INJECTOR | Freq: Every day | SUBCUTANEOUS | Status: DC

## 2013-07-24 MED ORDER — INSULIN PEN NEEDLE 32G X 5 MM MISC: 1.0000 | Freq: Every day | Status: DC

## 2013-07-24 MED ORDER — GLIMEPIRIDE 2 MG PO TABS: 2.0000 mg | ORAL_TABLET | Freq: Every day | ORAL | Status: DC

## 2013-07-24 NOTE — Progress Notes (Signed)
Patient ID: Abigail [name], female   DOB: [birth year], 62 y.o.   MRN: [id number]    Reason for Appointment : Consultation for Type 2 Diabetes  History of Present Illness          Diagnosis: Type 2 diabetes mellitus, date of diagnosis: 2004 ?        Past history: She initially had nonspecific symptoms of shakiness at diagnosis and had only mild hyperglycemia. Subsequently was started on metformin low doses for treatment The dose of metformin was gradually increased and she thinks she has been taking 1000 mg twice a day for at least a year. She did have nausea with increasing the dose; however this is better now although not resolved. She probably tried Byetta several years ago but she believes it did not help her control  Recent history: She has been managed with metformin alone for the last year and a half but blood sugars have been progressively higher. She does not have any excessive thirst or urination. She is now referred by her PCP for  further management.    Glucose monitoring:  done one time a day         Glucometer: off brand  Blood Glucose readings from recall: 170-225, highest 315       up for 1 1/2 yrs  Hypoglycemia frequency: Never.           Self-care: The diet that the patient has been following NW:GNFAO to limit fat intake, portions and sweets. Will have fruit for snacks. Eating out 2-3 times a week, rarely fast food     Meals: 3 meals per day.          She has had a weight range of 168-185 in the last few years Physical activity: exercise: Walking in the morning 30-45 minutes  4-5/7 days for several months.          Dietician visit: Most recent: Years ago.          Retinal exam: Most recent: 06/2012.    Lab Results  Component Value Date   HGBA1C 8.8 02/25/2013   Lab Results  Component Value Date   MICROALBUR 0.85 03/11/2012    Wt Readings from Last 3 Encounters:  07/24/13 173 lb 8 oz (78.699 kg)  02/25/13 168 lb (76.204 kg)  11/08/12 168 lb 4 oz (76.318 kg)       Medication List       This list is accurate as of: 07/24/13  9:57 AM.  Always use your most recent med list.               aspirin 81 MG tablet  Take 81 mg by mouth as needed.     atorvastatin 20 MG tablet  Commonly known as:  LIPITOR  Take 1 tablet (20 mg total) by mouth daily.     doxycycline 100 MG tablet  Commonly known as:  VIBRA-TABS  Take 1 tablet (100 mg total) by mouth 2 (two) times daily.     FLUoxetine 20 MG capsule  Commonly known as:  PROZAC  Take 1 capsule (20 mg total) by mouth daily.     fluticasone 50 MCG/ACT nasal spray  Commonly known as:  FLONASE  Place 2 sprays into the nose daily.     losartan 25 MG tablet  Commonly known as:  COZAAR  Take 1 tablet (25 mg total) by mouth daily.     metFORMIN 500 MG tablet  Commonly known as:  GLUCOPHAGE  Take  2 tablets (1,000 mg total) by mouth 2 (two) times daily with a meal. NEED VISIT, LABS!!     OMEGA-3 KRILL OIL PO  Take 1 capsule by mouth 2 (two) times daily.     omeprazole 20 MG capsule  Commonly known as:  PRILOSEC  Take 1 capsule (20 mg total) by mouth daily.     traZODone 150 MG tablet  Commonly known as:  DESYREL  Take 1 tablet (150 mg total) by mouth at bedtime.     triamterene-hydrochlorothiazide 75-50 MG per tablet  Commonly known as:  MAXZIDE  Take 1 tablet by mouth as needed.     vitamin B-12 100 MCG tablet  Commonly known as:  CYANOCOBALAMIN  Take 100 mcg by mouth daily.        Allergies:  Allergies  Allergen Reactions  . Cephalexin Hives  . Sulfa Antibiotics Other (See Comments)    Unknown by pt    Past Medical History  Diagnosis Date  . Diabetes mellitus   . Hypertension   . Hyperlipidemia   . Anxiety     Past Surgical History  Procedure Laterality Date  . Ovarian cyst removal    . Hammer toe surgery      Family History  Problem Relation Age of Onset  . Diabetes Mother   . Diabetes Father   . Heart disease Father   . Heart disease Brother      Social History:  reports that she has never smoked. She does not have any smokeless tobacco history on file. She reports that she drinks about 0.6 ounces of alcohol per week. She reports that she does not use illicit drugs.    Review of Systems       Lipids:  on treatment with Lipitor for years, last LDL 100            Skin: No rash or infections     Thyroid:  No  unusual fatigue.     The blood pressure has been normal, taking 25 mg Cozaar for "kidney protection"         has had swelling of feet.  will take a diuretic about 2 days a week      No shortness of breath on exertion.     Bowel habits: Normal.       No frequency of urination or nocturia       No joint  pains.         No history of Numbness, tingling or burning in her feet. May have a little numb feeling after sitting for long   LABS:  No visits with results within 1 Week(s) from this visit. Latest known visit with results is:  Office Visit on 02/25/2013  Component Date Value Range Status  . Hemoglobin A1C 02/25/2013 8.8   Final  . Cholesterol 02/25/2013 192  0 - 200 mg/dL Final   Comment: ATP III Classification:                                < 200        mg/dL        Desirable                               200 - 239     mg/dL        Borderline High                               >=  240        mg/dL        High                             . Triglycerides 02/25/2013 185* <150 mg/dL Final  . HDL 30/86/5784 55  >39 mg/dL Final  . Total CHOL/HDL Ratio 02/25/2013 3.5   Final  . VLDL 02/25/2013 37  0 - 40 mg/dL Final  . LDL Cholesterol 02/25/2013 100* 0 - 99 mg/dL Final   Comment:                            Total Cholesterol/HDL Ratio:CHD Risk                                                 Coronary Heart Disease Risk Table                                                                 Men       Women                                   1/2 Average Risk              3.4        3.3                                        Average Risk              5.0        4.4                                    2X Average Risk              9.6        7.1                                    3X Average Risk             23.4       11.0                          Use the calculated Patient Ratio above and the CHD Risk table                           to determine the patient's CHD Risk.                          ATP III Classification (LDL):                                <  100        mg/dL         Optimal                               100 - 129     mg/dL         Near or Above Optimal                               130 - 159     mg/dL         Borderline High                               160 - 189     mg/dL         High                                > 190        mg/dL         Very High                             . WBC 02/25/2013 7.0  4.6 - 10.2 K/uL Final  . Lymph, poc 02/25/2013 2.0  0.6 - 3.4 Final  . POC LYMPH PERCENT 02/25/2013 28.1  10 - 50 %L Final  . MID (cbc) 02/25/2013 0.5  0 - 0.9 Final  . POC MID % 02/25/2013 7.4  0 - 12 %M Final  . POC Granulocyte 02/25/2013 4.5  2 - 6.9 Final  . Granulocyte percent 02/25/2013 64.5  37 - 80 %G Final  . RBC 02/25/2013 4.79  4.04 - 5.48 M/uL Final  . Hemoglobin 02/25/2013 12.3  12.2 - 16.2 g/dL Final  . HCT, POC 16/08/9603 39.3  37.7 - 47.9 % Final  . MCV 02/25/2013 82.0  80 - 97 fL Final  . MCH, POC 02/25/2013 25.7* 27 - 31.2 pg Final  . MCHC 02/25/2013 31.3* 31.8 - 35.4 g/dL Final  . RDW, POC 54/07/8118 13.3   Final  . Platelet Count, POC 02/25/2013 269  142 - 424 K/uL Final  . MPV 02/25/2013 6.7  0 - 99.8 fL Final    Physical Examination:  BP 116/68  Pulse 90  Temp(Src) 98.5 F (36.9 C)  Resp 12  Ht 5\' 5"  (1.651 m)  Wt 173 lb 8 oz (78.699 kg)  BMI 28.87 kg/m2  SpO2 97%  GENERAL:         Patient has generalized obesity.   HEENT:         Eye exam shows normal external appearance. Fundus exam shows no retinopathy. Oral exam shows normal mucosa .  NECK:          General:  Neck exam shows no lymphadenopathy. Carotids are normal to palpation and no bruit heard. Thyroid is not enlarged and no nodules felt.   LUNGS:         Chest is symmetrical. Lungs are clear to auscultation.[name]   HEART:         Heart sounds:  S1 and S2 are normal. No murmurs or clicks heard., no S3 or S4.   ABDOMEN:         General:  There is no distention present. Liver and spleen are not palpable. No other mass or tenderness present.  EXTREMITIES:     There is no edema. No skin lesions present.[name]  NEUROLOGICAL:        Vibration sense is mildly reduced in toes. Ankle jerks are normal bilaterally.          Diabetic foot exam:  as in the foot exam section MUSCULOSKELETAL:       There is no enlargement or deformity of the joints. Spine is normal to inspection.[name]   PEDAL pulses:Normal SKIN:       No rash or lesions of concern.        ASSESSMENT:  Diabetes type 2, uncontrolled - 250.02    Her blood sugars are markedly increased especially for the last 1-1/2 years with failure of metformin that she has been taking for several years She does not have symptoms of hyperglycemia currently Overall she does have fairly good lifestyle with low fat and low calorie diet as well as a regular walking program Most likely has progression of her diabetes with relative insulin deficiency. Discussed possibly using insulin in the future  Complications: None, need to assess microalbumin today, no significant signs of neuropathy  Hyperlipidemia: Currently being treated with moderate dose Lipitor and has fairly good control of lipids  No history of hypertension   PLAN:   Start Victoza for multiple benefits and to get at least a 1-1.5% reduction and A1c. Discussed mechanism of action, effects on glucose and insulin as well as benefits with increased satiety and weight loss. Discussed possible side effects, safety, titration, use of the flex pen. Demonstrated how to use the Victoza pen in the office  Today. She will start with  usual dose of 0.6 mg and increased it to 1.2 mg in 1 week if tolerated   She will also start Amaryl 2 mg at suppertime to help with glucose toxicity and this may be able to be tapered off subsequently  Since she has some nausea with  2000 mg metformin can reduce this to one tablet in the morning and continue 1000 mg in the evening  Lenya Sterne 07/24/2013, 9:57 AM

## 2013-07-24 NOTE — Patient Instructions (Addendum)
Start VICTOZA injection with the sample pen once daily at the same time of the day.  Dial the dose to 0.6 mg for the first week.  You may  experience nausea in the first few days which usually gets better the After 1 week increase the dose to 1.2mg  daily if no nausea.  You may inject in the stomach, thigh or arm.   You will feel fullness of the stomach with starting the medication and should try to keep portions of food small.    Reduce am Metformin to one, call when needing refill  Amaryl 2mg  at supper daily  Please check blood sugars at least half the time about 2 hours after any meal and as directed on waking up. Please bring blood sugar monitor to each visit

## 2013-07-30 ENCOUNTER — Telehealth: Payer: Self-pay | Admitting: *Deleted

## 2013-07-30 NOTE — Telephone Encounter (Signed)
Patient wanted to know if it is okay to take Amaryl in the morning.  Okay per Dr Lucianne Muss.  Unable to leave message on cell "voicemail box not set up yet.  Left message on machine for patient at work number.

## 2013-07-31 ENCOUNTER — Telehealth: Payer: Self-pay | Admitting: Endocrinology

## 2013-07-31 MED ORDER — GLUCOSE BLOOD VI STRP: ORAL_STRIP | Status: DC

## 2013-07-31 NOTE — Telephone Encounter (Signed)
rx sent

## 2013-08-08 ENCOUNTER — Other Ambulatory Visit: Payer: Self-pay | Admitting: Obstetrics and Gynecology

## 2013-08-20 ENCOUNTER — Encounter: Payer: Self-pay | Admitting: Endocrinology

## 2013-08-20 ENCOUNTER — Other Ambulatory Visit: Payer: Self-pay | Admitting: *Deleted

## 2013-08-20 ENCOUNTER — Ambulatory Visit (INDEPENDENT_AMBULATORY_CARE_PROVIDER_SITE_OTHER): Payer: Federal, State, Local not specified - PPO | Admitting: Endocrinology

## 2013-08-20 VITALS — BP 116/66 | HR 98 | Temp 98.5°F | Resp 12 | Ht 65.0 in | Wt 168.6 lb

## 2013-08-20 DIAGNOSIS — E785 Hyperlipidemia, unspecified: Secondary | ICD-10-CM

## 2013-08-20 NOTE — Progress Notes (Signed)
Patient ID: Abigail [name], female   DOB: May 31, 1951, 62 y.o.   MRN: [id number]    Reason for Appointment : Consultation for Type 2 Diabetes  History of Present Illness          Diagnosis: Type 2 diabetes mellitus, date of diagnosis: 2004 ?        Past history: She initially had nonspecific symptoms of shakiness at diagnosis and had only mild hyperglycemia. Subsequently was started on metformin low doses for treatment The dose of metformin was gradually increased and she thinks she has been taking 1000 mg twice a day for at least a year. She did have nausea with increasing the dose; however this is better now although not resolved. She probably tried Byetta several years ago but she believes it did not help her control. She has been managed with metformin alone for the last year and a half but blood sugars have been progressively higher.  Blood sugars prior to her initial consultation were mostly over 200, up to 315 and her A1c was 10.3%  Recent history: Her blood sugars are significantly better with adding Victoza in 9/14. She has titrated the dose to 1.2 mg and has had no difficulty doing her injection.  She has tolerated this now except for initial nausea and also has had weight loss probably from better portion control She could not tolerate glimepiride because of nausea or right after taking this and has not used it She continues to exercise and is motivated to get better control Her post prandial readings at night are fairly normal Her  fasting readings are not consistently good and recent range 121-165    Glucose monitoring:  done one time a day         Glucometer:  One Touch Verio Blood Glucose readings from download: Fasting reading 163 with recent readings 130-160 range. Nonfasting recently 123-152 after supper. Lowest reading 103 at 10 AM and highest reading 232 about a month ago at night Hypoglycemia frequency: Never.           Self-care: The diet that the patient has been  following AO:ZHYQM to limit fat intake, portions and sweets. Will have fruit for snacks.  Eating out 2-3 times a week, rarely fast food     Meals: 3 meals per day.          She has had a weight range of 168-185 in the last few years Physical activity: exercise: Walking in the morning 30-45 minutes  4-5/7 days for several months.          Dietician visit: Most recent: Years ago.          Retinal exam: Most recent: 06/2012. Due to see on 10/15 Previously A1c: 8.8% in 4/14  Lab Results  Component Value Date   HGBA1C 10.3* 07/24/2013   Lab Results  Component Value Date   MICROALBUR 3.0* 07/24/2013    Wt Readings from Last 3 Encounters:  08/20/13 168 lb 9.6 oz (76.476 kg)  07/24/13 173 lb 8 oz (78.699 kg)  02/25/13 168 lb (76.204 kg)      Medication List       This list is accurate as of: 08/20/13  8:20 AM.  Always use your most recent med list.               aspirin 81 MG tablet  Take 81 mg by mouth as needed.     atorvastatin 20 MG tablet  Commonly known as:  LIPITOR  Take  1 tablet (20 mg total) by mouth daily.     FLUoxetine 20 MG capsule  Commonly known as:  PROZAC  Take 1 capsule (20 mg total) by mouth daily.     fluticasone 50 MCG/ACT nasal spray  Commonly known as:  FLONASE  Place 2 sprays into the nose daily.     glimepiride 2 MG tablet  Commonly known as:  AMARYL  Take 1 tablet (2 mg total) by mouth daily before breakfast.     glucose blood test strip  Commonly known as:  ONETOUCH VERIO  Use as instructed to check blood sugars two times a day     Insulin Pen Needle 32G X 5 MM Misc  Commonly known as:  NOVOTWIST  Inject 1 each into the skin daily.     Liraglutide 18 MG/3ML Sopn  Commonly known as:  VICTOZA  Inject 1.2 mg into the skin daily.     losartan 25 MG tablet  Commonly known as:  COZAAR  Take 1 tablet (25 mg total) by mouth daily.     metFORMIN 500 MG tablet  Commonly known as:  GLUCOPHAGE  Take 2 tablets (1,000 mg total) by mouth 2  (two) times daily with a meal. NEED VISIT, LABS!!     OMEGA-3 KRILL OIL PO  Take 1 capsule by mouth 2 (two) times daily.     omeprazole 20 MG capsule  Commonly known as:  PRILOSEC  Take 1 capsule (20 mg total) by mouth daily.     traZODone 150 MG tablet  Commonly known as:  DESYREL  Take 1 tablet (150 mg total) by mouth at bedtime.     triamterene-hydrochlorothiazide 75-50 MG per tablet  Commonly known as:  MAXZIDE  Take 1 tablet by mouth as needed.     vitamin B-12 100 MCG tablet  Commonly known as:  CYANOCOBALAMIN  Take 100 mcg by mouth daily.        Allergies:  Allergies  Allergen Reactions  . Cephalexin Hives  . Sulfa Antibiotics Other (See Comments)    Unknown by pt    Past Medical History  Diagnosis Date  . Diabetes mellitus   . Hypertension   . Hyperlipidemia   . Anxiety     Past Surgical History  Procedure Laterality Date  . Ovarian cyst removal    . Hammer toe surgery      Family History  Problem Relation Age of Onset  . Diabetes Mother   . Diabetes Father   . Heart disease Father   . Heart disease Brother     Social History:  reports that she has never smoked. She does not have any smokeless tobacco history on file. She reports that she drinks about 0.6 ounces of alcohol per week. She reports that she does not use illicit drugs.    Review of Systems       Lipids:  on treatment with Lipitor for years, last LDL 100            Skin: No rash or infections     Thyroid:  No  unusual fatigue.     The blood pressure has been normal, taking 25 mg Cozaar for "kidney protection"         has had swelling of feet.  will take a diuretic about 2 days a week      No shortness of breath on exertion.     Bowel habits: Normal.       No frequency of urination or  nocturia       No joint  pains.         No history of Numbness, tingling or burning in her feet. May have a little numb feeling after sitting for long   LABS:  No visits with results  within 1 Week(s) from this visit. Latest known visit with results is:  Office Visit on 07/24/2013  Component Date Value Range Status  . Hemoglobin A1C 07/24/2013 10.3* 4.6 - 6.5 % Final   Glycemic Control Guidelines for People with Diabetes:Non Diabetic:  <6%Goal of Therapy: <7%Additional Action Suggested:  >8%   . Microalb, Ur 07/24/2013 3.0* 0.0 - 1.9 mg/dL Final  . Creatinine,U 21/30/8657 102.9   Final  . Microalb Creat Ratio 07/24/2013 2.9  0.0 - 30.0 mg/g Final  . Sodium 07/24/2013 134* 135 - 145 mEq/L Final  . Potassium 07/24/2013 4.2  3.5 - 5.1 mEq/L Final  . Chloride 07/24/2013 98  96 - 112 mEq/L Final  . CO2 07/24/2013 27  19 - 32 mEq/L Final  . Glucose, Bld 07/24/2013 195* 70 - 99 mg/dL Final  . BUN 84/69/6295 14  6 - 23 mg/dL Final  . Creatinine, Ser 07/24/2013 1.0  0.4 - 1.2 mg/dL Final  . Total Bilirubin 07/24/2013 0.3  0.3 - 1.2 mg/dL Final  . Alkaline Phosphatase 07/24/2013 76  39 - 117 U/L Final  . AST 07/24/2013 22  0 - 37 U/L Final  . ALT 07/24/2013 22  0 - 35 U/L Final  . Total Protein 07/24/2013 7.0  6.0 - 8.3 g/dL Final  . Albumin 28/41/3244 3.9  3.5 - 5.2 g/dL Final  . Calcium 11/09/7251 8.9  8.4 - 10.5 mg/dL Final  . GFR 66/44/0347 60.04  >60.00 mL/min Final    Physical Examination:  BP 116/66  Pulse 98  Temp(Src) 98.5 F (36.9 C)  Resp 12  Ht 5\' 5"  (1.651 m)  Wt 168 lb 9.6 oz (76.476 kg)  BMI 28.06 kg/m2  SpO2 97%  GENERAL:         Patient has generalized obesity.   HEENT:         Eye exam shows normal external appearance. Fundus exam shows no retinopathy. Oral exam shows normal mucosa .  NECK:         General:  Neck exam shows no lymphadenopathy. Carotids are normal to palpation and no bruit heard. Thyroid is not enlarged and no nodules felt.   LUNGS:         Chest is symmetrical. Lungs are clear to auscultation.[name]   HEART:         Heart sounds:  S1 and S2 are normal. No murmurs or clicks heard., no S3 or S4.   ABDOMEN:         General:  There is no  distention present. Liver and spleen are not palpable. No other mass or tenderness present.  EXTREMITIES:     There is no edema. No skin lesions present.[name]  NEUROLOGICAL:        Vibration sense is mildly reduced in toes. Ankle jerks are normal bilaterally.          Diabetic foot exam:  as in the foot exam section MUSCULOSKELETAL:       There is no enlargement or deformity of the joints. Spine is normal to inspection.[name]   PEDAL pulses:Normal SKIN:       No rash or lesions of concern.        ASSESSMENT:  Diabetes type 2, uncontrolled -  250.02   Her blood sugars are significantly better with adding Victoza although her morning readings are still relatively high. She has tolerated this now except for initial nausea and also has had weight loss probably from better portion control She continues to exercise and is motivated to get better control Her post prandial readings at night are fairly normal Although her fasting readings are not consistently good there may improve further over time and will not add another drug as yet  She also needs more comprehensive diabetes education; offered a nutritionist consultation but she would first like to see nurse educator  markedly increased especially for the last 1-1/2 years with failure of metformin that she has been taking for several years She does not have symptoms of hyperglycemia currently Overall she does have fairly good lifestyle with low fat and low calorie diet as well as a regular walking program Most likely has progression of her diabetes with relative insulin deficiency. Discussed possibly using insulin in the future  Hyperlipidemia: Currently being treated with moderate dose Lipitor and  will need followup levels on her next visit  PLAN:   Continue Victoza 1.2 mg More readings after breakfast or lunch Consider adding a third drug if fasting readings are consistently around 150 and also his A1c is still over 7% on the next  visit   Imaya Duffy 08/20/2013, 8:20 AM

## 2013-08-20 NOTE — Patient Instructions (Addendum)
Please check blood sugars at least half the time about 2 hours after any meal and as directed on waking up.   Please bring blood sugar monitor to each visit  

## 2013-09-04 ENCOUNTER — Encounter: Payer: Federal, State, Local not specified - PPO | Admitting: Nutrition

## 2013-09-10 ENCOUNTER — Encounter: Payer: Federal, State, Local not specified - PPO | Attending: Endocrinology | Admitting: Nutrition

## 2013-09-10 VITALS — Wt 168.9 lb

## 2013-09-10 DIAGNOSIS — Z713 Dietary counseling and surveillance: Secondary | ICD-10-CM | POA: Insufficient documentation

## 2013-09-10 DIAGNOSIS — IMO0001 Reserved for inherently not codable concepts without codable children: Secondary | ICD-10-CM | POA: Insufficient documentation

## 2013-09-11 NOTE — Progress Notes (Signed)
SBGM: FBSs: 136-186             2hr. PcL: 130-165             2hr. PcS: 130-180  Meals are balanced most of the time, and not high in carbs. Her protein at each meal appears to be adequate.  Her fat content varies at lunch and supper.  She was reminded that these are the calories that will prevent weight loss, and was given tips for reducing the fat at each meal.  She reported good understanding of this, and had not dietary questions for me.  She reports that the Victoza is continuing to decrease her appetite.  She is taking 1.2mg .once daily.   She is going to the gym and doing aerobics for 40 min., 3-4 days/wk.  Suggested that if her weight is not continuing to drop, she might try to increase the intensity of her work out.  She agreed to do this.    Because her FBSs are still above normal range, encouraged her to try to do 10 min.  Of walking after supper, to see if this will help to bring down her FBSs.  She agreed to try this.

## 2013-09-12 ENCOUNTER — Other Ambulatory Visit: Payer: Self-pay

## 2013-09-26 ENCOUNTER — Other Ambulatory Visit (INDEPENDENT_AMBULATORY_CARE_PROVIDER_SITE_OTHER): Payer: Federal, State, Local not specified - PPO

## 2013-09-26 LAB — LIPID PANEL
HDL: 46.3 mg/dL (ref >39.00)
LDL Cholesterol: 61 mg/dL (ref 0–99)
Total CHOL/HDL Ratio: 3
Triglycerides: 66 mg/dL (ref 0.0–149.0)

## 2013-09-26 LAB — COMPREHENSIVE METABOLIC PANEL
ALT: 20 U/L (ref 0–35)
AST: 12 U/L (ref 0–37)
Creatinine, Ser: 1.1 mg/dL (ref 0.4–1.2)
Sodium: 135 mEq/L (ref 135–145)
Total Bilirubin: 0.5 mg/dL (ref 0.3–1.2)
Total Protein: 6.8 g/dL (ref 6.0–8.3)

## 2013-10-01 ENCOUNTER — Encounter: Payer: Self-pay | Admitting: Endocrinology

## 2013-10-01 ENCOUNTER — Ambulatory Visit (INDEPENDENT_AMBULATORY_CARE_PROVIDER_SITE_OTHER): Payer: Federal, State, Local not specified - PPO | Admitting: Endocrinology

## 2013-10-01 ENCOUNTER — Other Ambulatory Visit: Payer: Self-pay | Admitting: *Deleted

## 2013-10-01 ENCOUNTER — Other Ambulatory Visit: Payer: Federal, State, Local not specified - PPO

## 2013-10-01 VITALS — BP 118/78 | HR 100 | Temp 97.9°F | Resp 10 | Ht 65.0 in | Wt 164.7 lb

## 2013-10-01 DIAGNOSIS — E785 Hyperlipidemia, unspecified: Secondary | ICD-10-CM

## 2013-10-01 MED ORDER — METFORMIN HCL 1000 MG PO TABS: 1000.0000 mg | ORAL_TABLET | Freq: Two times a day (BID) | ORAL | Status: DC

## 2013-10-01 MED ORDER — LIRAGLUTIDE 18 MG/3ML ~~LOC~~ SOPN: 1.2000 mg | PEN_INJECTOR | Freq: Every day | SUBCUTANEOUS | Status: DC

## 2013-10-01 NOTE — Patient Instructions (Addendum)
Metformin 1g am and bedtime  Please check blood sugars  about 2 hours after any meal and as directed on waking up.  Please bring blood sugar monitor to each visit

## 2013-10-01 NOTE — Progress Notes (Signed)
Patient ID: Abigail [name], female   DOB: 11/23/1950, 62 y.o.   MRN: [id number]   Reason for Appointment : F/u for Type 2 Diabetes  History of Present Illness          Diagnosis: Type 2 diabetes mellitus, date of diagnosis: 2004 ?        Past history: She initially had nonspecific symptoms of shakiness at diagnosis and had only mild hyperglycemia. Subsequently was started on metformin low doses for treatment The dose of metformin was gradually increased and she thinks she has been taking 1000 mg twice a day for at least a year. She did have nausea with increasing the dose; however this is better now although not resolved. She probably tried Byetta several years ago but she believes it did not help her control. She has been managed with metformin alone for the last year and a half but blood sugars have been progressively higher.  Blood sugars prior to her initial consultation were mostly over 200, up to 315 and her A1c was 10.3%  Recent history:  She ran out of metformin about 2 weeks ago and did not call for a refill As a result her fasting blood sugars are significantly higher although still overall not well controlled Her blood sugars have been overall better with adding Victoza in 9/14.  Still using  1.2 mg and has had no nausea   She did have nausea with Amaryl  She had lost another 4 pounds recently She continues to exercise  Her post prandial readings at night are fairly normal but is checking them infrequently.    Glucose monitoring:  done one time a day         Glucometer:  One Touch Verio Blood Glucose readings from download: Fasting range 140-232 with median 171 and nonfasting 105-157  Hypoglycemia frequency:  none .           Self-care: The diet that the patient has been following FA:OZHYQ to limit fat intake, portions and sweets. Will have fruit for snacks.  Eating out 2-3 times a week, rarely fast food     Meals: 3 meals per day.          She has had a weight range of  168-185 in the last few years Physical activity: exercise: Walking in the morning 30-45 minutes  4-5/7 days for several months.          Dietician visit: Most recent: Years ago. Has seen nurse educator recently .          Retinal exam: Most recent: 06/2012. Due to see on 10/15 Previously A1c: 8.8% in 4/14  Lab Results  Component Value Date   HGBA1C 7.6* 09/26/2013   Lab Results  Component Value Date   MICROALBUR 3.0* 07/24/2013    Wt Readings from Last 3 Encounters:  10/01/13 164 lb 11.2 oz (74.707 kg)  09/11/13 168 lb 14.4 oz (76.613 kg)  08/20/13 168 lb 9.6 oz (76.476 kg)      Medication List       This list is accurate as of: 10/01/13  8:11 AM.  Always use your most recent med list.               aspirin 81 MG tablet  Take 81 mg by mouth as needed.     atorvastatin 20 MG tablet  Commonly known as:  LIPITOR  Take 1 tablet (20 mg total) by mouth daily.     FLUoxetine 20 MG capsule  Commonly known as:  PROZAC  Take 1 capsule (20 mg total) by mouth daily.     fluticasone 50 MCG/ACT nasal spray  Commonly known as:  FLONASE  Place 2 sprays into the nose daily.     glimepiride 2 MG tablet  Commonly known as:  AMARYL  Take 1 tablet (2 mg total) by mouth daily before breakfast.     glucose blood test strip  Commonly known as:  ONETOUCH VERIO  Use as instructed to check blood sugars two times a day     Insulin Pen Needle 32G X 5 MM Misc  Commonly known as:  NOVOTWIST  Inject 1 each into the skin daily.     Liraglutide 18 MG/3ML Sopn  Commonly known as:  VICTOZA  Inject 1.2 mg into the skin daily.     losartan 25 MG tablet  Commonly known as:  COZAAR  Take 1 tablet (25 mg total) by mouth daily.     metFORMIN 500 MG tablet  Commonly known as:  GLUCOPHAGE  Take 2 tablets (1,000 mg total) by mouth 2 (two) times daily with a meal. NEED VISIT, LABS!!     OMEGA-3 KRILL OIL PO  Take 1 capsule by mouth 2 (two) times daily.     omeprazole 20 MG capsule   Commonly known as:  PRILOSEC  Take 1 capsule (20 mg total) by mouth daily.     traZODone 150 MG tablet  Commonly known as:  DESYREL  Take 1 tablet (150 mg total) by mouth at bedtime.     triamterene-hydrochlorothiazide 75-50 MG per tablet  Commonly known as:  MAXZIDE  Take 1 tablet by mouth as needed.     vitamin B-12 100 MCG tablet  Commonly known as:  CYANOCOBALAMIN  Take 100 mcg by mouth daily.        Allergies:  Allergies  Allergen Reactions  . Amaryl [Glimepiride] Nausea Only  . Cephalexin Hives  . Sulfa Antibiotics Other (See Comments)    Unknown by pt    Past Medical History  Diagnosis Date  . Diabetes mellitus   . Hypertension   . Hyperlipidemia   . Anxiety     Past Surgical History  Procedure Laterality Date  . Ovarian cyst removal    . Hammer toe surgery      Family History  Problem Relation Age of Onset  . Diabetes Mother   . Diabetes Father   . Heart disease Father   . Heart disease Brother     Social History:  reports that she has never smoked. She does not have any smokeless tobacco history on file. She reports that she drinks about 0.6 ounces of alcohol per week. She reports that she does not use illicit drugs.    Review of Systems       Lipids:  on treatment with Lipitor for years, LDL is  excellent       The blood pressure has been normal, taking 25 mg Cozaar for "kidney protection"         No history of Numbness, tingling or burning in her feet. May have a little numb feeling after sitting for long   LABS:  Appointment on 09/26/2013  Component Date Value Range Status  . Cholesterol 09/26/2013 120  0 - 200 mg/dL Final   ATP III Classification       Desirable:  < 200 mg/dL               Borderline High:  200 -  239 mg/dL          High:  > = 010 mg/dL  . Triglycerides 09/26/2013 66.0  0.0 - 149.0 mg/dL Final   Normal:  <272 mg/dLBorderline High:  150 - 199 mg/dL  . HDL 09/26/2013 46.30  >39.00 mg/dL Final  . VLDL 53/66/4403 13.2   0.0 - 40.0 mg/dL Final  . LDL Cholesterol 09/26/2013 61  0 - 99 mg/dL Final  . Total CHOL/HDL Ratio 09/26/2013 3   Final                  Men          Women1/2 Average Risk     3.4          3.3Average Risk          5.0          4.42X Average Risk          9.6          7.13X Average Risk          15.0          11.0                      . Sodium 09/26/2013 135  135 - 145 mEq/L Final  . Potassium 09/26/2013 3.7  3.5 - 5.1 mEq/L Final  . Chloride 09/26/2013 101  96 - 112 mEq/L Final  . CO2 09/26/2013 29  19 - 32 mEq/L Final  . Glucose, Bld 09/26/2013 153* 70 - 99 mg/dL Final  . BUN 47/42/5956 15  6 - 23 mg/dL Final  . Creatinine, Ser 09/26/2013 1.1  0.4 - 1.2 mg/dL Final  . Total Bilirubin 09/26/2013 0.5  0.3 - 1.2 mg/dL Final  . Alkaline Phosphatase 09/26/2013 69  39 - 117 U/L Final  . AST 09/26/2013 12  0 - 37 U/L Final  . ALT 09/26/2013 20  0 - 35 U/L Final  . Total Protein 09/26/2013 6.8  6.0 - 8.3 g/dL Final  . Albumin 38/75/6433 3.9  3.5 - 5.2 g/dL Final  . Calcium 29/51/8841 8.9  8.4 - 10.5 mg/dL Final  . GFR 66/04/3015 56.07* >60.00 mL/min Final  . Hemoglobin A1C 09/26/2013 7.6* 4.6 - 6.5 % Final   Glycemic Control Guidelines for People with Diabetes:Non Diabetic:  <6%Goal of Therapy: <7%Additional Action Suggested:  >8%     Physical Examination:  Wt 164 lb 11.2 oz (74.707 kg)          ASSESSMENT/PLAN:  Diabetes type 2, uncontrolled - 250.02   Her blood sugars are significantly better with  regimen of  Victoza, metformin and she has lost weight However she did not refill her metformin since she was expecting a different formulation and blood sugars have gone up to as high as 230 2 in the morning Since she has mostly high readings overnight will try her on 1000 mg of immediate release metformin at bedtime and continue 1000 mg with a single tablet in the morning, new prescription sent She will call if blood sugars are not improved May also consider low-dose glipizide at bedtime  if the fasting readings continue to be high  Hyperlipidemia: Currently being treated with moderate dose Lipitor and LDL is excellent    Abigail [name] 10/01/2013, 8:11 AM

## 2013-11-29 ENCOUNTER — Other Ambulatory Visit (INDEPENDENT_AMBULATORY_CARE_PROVIDER_SITE_OTHER): Payer: Federal, State, Local not specified - PPO

## 2013-11-29 DIAGNOSIS — IMO0001 Reserved for inherently not codable concepts without codable children: Secondary | ICD-10-CM

## 2013-11-29 DIAGNOSIS — E1165 Type 2 diabetes mellitus with hyperglycemia: Principal | ICD-10-CM

## 2013-11-29 LAB — BASIC METABOLIC PANEL
BUN: 13 mg/dL (ref 6–23)
CALCIUM: 8.8 mg/dL (ref 8.4–10.5)
CO2: 29 mEq/L (ref 19–32)
CREATININE: 1.1 mg/dL (ref 0.4–1.2)
Chloride: 102 mEq/L (ref 96–112)
GFR: 51.49 mL/min — AB (ref >60.00)
Glucose, Bld: 132 mg/dL — ABNORMAL HIGH (ref 70–99)
Potassium: 4.1 mEq/L (ref 3.5–5.1)
SODIUM: 138 meq/L (ref 135–145)

## 2013-11-30 LAB — FRUCTOSAMINE: Fructosamine: 223 umol/L (ref <285)

## 2013-12-03 ENCOUNTER — Encounter: Payer: Self-pay | Admitting: Endocrinology

## 2013-12-03 ENCOUNTER — Ambulatory Visit (INDEPENDENT_AMBULATORY_CARE_PROVIDER_SITE_OTHER): Payer: Federal, State, Local not specified - PPO | Admitting: Endocrinology

## 2013-12-03 VITALS — BP 126/70 | HR 110 | Temp 97.9°F | Resp 14 | Ht 65.0 in | Wt 159.7 lb

## 2013-12-03 DIAGNOSIS — IMO0001 Reserved for inherently not codable concepts without codable children: Secondary | ICD-10-CM

## 2013-12-03 DIAGNOSIS — E1165 Type 2 diabetes mellitus with hyperglycemia: Principal | ICD-10-CM

## 2013-12-03 MED ORDER — GLIPIZIDE 5 MG PO TABS: 5.0000 mg | ORAL_TABLET | Freq: Every day | ORAL | Status: DC

## 2013-12-03 NOTE — Patient Instructions (Signed)
Glipizide 5mg , take 1/2 at bedtime  More sugars at bedtime

## 2013-12-03 NOTE — Progress Notes (Signed)
Patient ID: Abigail [name], female   DOB: Dec 13, 1950, 63 y.o.   MRN: [id number]   Reason for Appointment : F/u for Type 2 Diabetes  History of Present Illness          Diagnosis: Type 2 diabetes mellitus, date of diagnosis: 2004 ?        Past history: She initially had nonspecific symptoms of shakiness at diagnosis and had only mild hyperglycemia. Subsequently was started on metformin low doses for treatment The dose of metformin was gradually increased and she thinks she has been taking 1000 mg twice a day for at least a year. She did have nausea with increasing the dose; however this is better now although not resolved. She probably tried Byetta several years ago but she believes it did not help her control. She has been managed with metformin alone for the last year and a half but blood sugars have been progressively higher.  Blood sugars prior to her initial consultation were mostly over 200, up to 315 and her A1c was 10.3%  Recent history:   Her blood sugars have been overall better with adding Victoza in 9/14.  Currently using 1.2 mg and has had no GI side effects.  She had lost another 5 pounds recently However her fasting readings continue to be significantly high. On her last visit she had been out of her metformin. She is back on this on 1000 mg in the morning and 1000 mg at bedtime She continues to exercise regularly Her post prandial readings are fairly good but is checking them infrequently especially after supper when she forgets to monitor.  Fructosamine does indicate overall fairly good control but she is still quite concerned about her high morning readings  Side effects from medications: She did have nausea with Amaryl  Glucose monitoring:  done one time a day         Glucometer:  One Touch Verio Blood Glucose readings from download:   PREMEAL Breakfast Lunch Dinner Bedtime Overall  Glucose range:  152-222   114-177   109     Mean/median:      157    POST-MEAL  PC Breakfast PC Lunch PC Dinner  Glucose range:  106-231   122-180   157   Mean/median:      Fasting range 140-232 with median 171 and nonfasting 105-157  Hypoglycemia frequency:  none .           Self-care: The diet that the patient has been following GU:[id number] to limit fat intake, portions and sweets. Will have fruit for snacks.  Eating out 2-3 times a week, rarely fast food     Meals: 3 meals per day.          She has had a weight range of 168-185 in the last few years Exercise: Walking in the morning 30 minutes  4-5/7 days for several months.          Dietician visit: Most recent: Years ago. Has seen nurse educator.          Retinal exam: Most recent: 06/2012. Due to see on 10/15  Labs: Previously A1c: 8.8% in 4/14  Lab Results  Component Value Date   HGBA1C 7.6* 09/26/2013   Lab Results  Component Value Date   MICROALBUR 3.0* 07/24/2013    Wt Readings from Last 3 Encounters:  12/03/13 159 lb 11.2 oz (72.439 kg)  10/01/13 164 lb 11.2 oz (74.707 kg)  09/11/13 168 lb 14.4 oz (76.613 kg)  Medication List       This list is accurate as of: 12/03/13  8:23 AM.  Always use your most recent med list.               aspirin 81 MG tablet  Take 81 mg by mouth as needed.     atorvastatin 20 MG tablet  Commonly known as:  LIPITOR  Take 1 tablet (20 mg total) by mouth daily.     FLUoxetine 20 MG capsule  Commonly known as:  PROZAC  Take 1 capsule (20 mg total) by mouth daily.     glucose blood test strip  Commonly known as:  ONETOUCH VERIO  Use as instructed to check blood sugars two times a day     Insulin Pen Needle 32G X 5 MM Misc  Commonly known as:  NOVOTWIST  Inject 1 each into the skin daily.     Liraglutide 18 MG/3ML Sopn  Commonly known as:  VICTOZA  Inject 1.2 mg into the skin daily.     losartan 25 MG tablet  Commonly known as:  COZAAR  Take 1 tablet (25 mg total) by mouth daily.     metFORMIN 1000 MG tablet  Commonly known as:  GLUCOPHAGE  Take  1 tablet (1,000 mg total) by mouth 2 (two) times daily with a meal.     OMEGA-3 KRILL OIL PO  Take 1 capsule by mouth 2 (two) times daily.     omeprazole 20 MG capsule  Commonly known as:  PRILOSEC  Take 1 capsule (20 mg total) by mouth daily.     traZODone 150 MG tablet  Commonly known as:  DESYREL  Take 1 tablet (150 mg total) by mouth at bedtime.     triamterene-hydrochlorothiazide 75-50 MG per tablet  Commonly known as:  MAXZIDE  Take 1 tablet by mouth as needed.     vitamin B-12 100 MCG tablet  Commonly known as:  CYANOCOBALAMIN  Take 100 mcg by mouth daily.        Allergies:  Allergies  Allergen Reactions  . Amaryl [Glimepiride] Nausea Only  . Cephalexin Hives  . Sulfa Antibiotics Other (See Comments)    Unknown by pt    Past Medical History  Diagnosis Date  . Diabetes mellitus   . Hypertension   . Hyperlipidemia   . Anxiety     Past Surgical History  Procedure Laterality Date  . Ovarian cyst removal    . Hammer toe surgery      Family History  Problem Relation Age of Onset  . Diabetes Mother   . Diabetes Father   . Heart disease Father   . Heart disease Brother     Social History:  reports that she has never smoked. She does not have any smokeless tobacco history on file. She reports that she drinks about 0.6 ounces of alcohol per week. She reports that she does not use illicit drugs.    Review of Systems       Lipids:  on treatment with Lipitor for years, LDL is  excellent       The blood pressure has been normal, taking 25 mg Cozaar for "kidney protection"         No history of Numbness, tingling or burning in her feet. May have a little numb feeling after sitting for long   LABS:  Appointment on 11/29/2013  Component Date Value Range Status  . Sodium 11/29/2013 138  135 - 145 mEq/L Final  .  Potassium 11/29/2013 4.1  3.5 - 5.1 mEq/L Final  . Chloride 11/29/2013 102  96 - 112 mEq/L Final  . CO2 11/29/2013 29  19 - 32 mEq/L Final  .  Glucose, Bld 11/29/2013 132* 70 - 99 mg/dL Final  . BUN 11/29/2013 13  6 - 23 mg/dL Final  . Creatinine, Ser 11/29/2013 1.1  0.4 - 1.2 mg/dL Final  . Calcium 11/29/2013 8.8  8.4 - 10.5 mg/dL Final  . GFR 11/29/2013 51.49* >60.00 mL/min Final  . Fructosamine 11/29/2013 223  <285 umol/L Final   Comment:                            Variations in levels of serum proteins (albumin and immunoglobulins)                          may affect fructosamine results.                               Physical Examination:  BP 126/70  Pulse 110  Temp(Src) 97.9 F (36.6 C)  Resp 14  Ht 5\' 5"  (1.651 m)  Wt 159 lb 11.2 oz (72.439 kg)  BMI 26.58 kg/m2  SpO2 95%          ASSESSMENT/PLAN:  Diabetes type 2, uncontrolled   Her blood sugars are overall fairly good with  regimen of  Victoza and maximum dose metformin and she continues to lose weight She does have fairly high readings in the mornings but her other readings appear to be fairly good Also fructosamine is excellent at 223 Currently she is very compliant with diet and exercise  Will add low-dose glipizide at bedtime to control her fasting hyperglycemia. She will start with 2.5 mg and if needed go up to 5 mg Also discussed with her that if her A1c is close to 6.5 we may not need to aggressively target her fasting readings   Cris Talavera 12/03/2013, 8:23 AM

## 2014-01-14 ENCOUNTER — Other Ambulatory Visit: Payer: Federal, State, Local not specified - PPO

## 2014-01-14 ENCOUNTER — Other Ambulatory Visit (INDEPENDENT_AMBULATORY_CARE_PROVIDER_SITE_OTHER): Payer: Federal, State, Local not specified - PPO

## 2014-01-14 DIAGNOSIS — IMO0001 Reserved for inherently not codable concepts without codable children: Secondary | ICD-10-CM

## 2014-01-14 DIAGNOSIS — E1165 Type 2 diabetes mellitus with hyperglycemia: Principal | ICD-10-CM

## 2014-01-14 LAB — COMPREHENSIVE METABOLIC PANEL
ALBUMIN: 4.1 g/dL (ref 3.5–5.2)
ALK PHOS: 67 U/L (ref 39–117)
ALT: 16 U/L (ref 0–35)
AST: 16 U/L (ref 0–37)
BILIRUBIN TOTAL: 0.3 mg/dL (ref 0.3–1.2)
BUN: 16 mg/dL (ref 6–23)
CO2: 29 mEq/L (ref 19–32)
Calcium: 9.2 mg/dL (ref 8.4–10.5)
Chloride: 101 mEq/L (ref 96–112)
Creatinine, Ser: 1.1 mg/dL (ref 0.4–1.2)
GFR: 56.02 mL/min — ABNORMAL LOW (ref >60.00)
Glucose, Bld: 91 mg/dL (ref 70–99)
POTASSIUM: 3.9 meq/L (ref 3.5–5.1)
SODIUM: 138 meq/L (ref 135–145)
TOTAL PROTEIN: 7.3 g/dL (ref 6.0–8.3)

## 2014-01-14 LAB — HEMOGLOBIN A1C: Hgb A1c MFr Bld: 6.6 % — ABNORMAL HIGH (ref 4.6–6.5)

## 2014-01-15 ENCOUNTER — Other Ambulatory Visit: Payer: Federal, State, Local not specified - PPO

## 2014-01-21 ENCOUNTER — Encounter: Payer: Self-pay | Admitting: Endocrinology

## 2014-01-21 ENCOUNTER — Ambulatory Visit (INDEPENDENT_AMBULATORY_CARE_PROVIDER_SITE_OTHER): Payer: Federal, State, Local not specified - PPO | Admitting: Endocrinology

## 2014-01-21 VITALS — BP 124/76 | HR 126 | Temp 97.8°F | Resp 16 | Ht 65.0 in | Wt 159.8 lb

## 2014-01-21 DIAGNOSIS — R5381 Other malaise: Secondary | ICD-10-CM

## 2014-01-21 DIAGNOSIS — E1165 Type 2 diabetes mellitus with hyperglycemia: Principal | ICD-10-CM

## 2014-01-21 DIAGNOSIS — E785 Hyperlipidemia, unspecified: Secondary | ICD-10-CM

## 2014-01-21 DIAGNOSIS — IMO0001 Reserved for inherently not codable concepts without codable children: Secondary | ICD-10-CM

## 2014-01-21 DIAGNOSIS — R5383 Other fatigue: Secondary | ICD-10-CM

## 2014-01-21 NOTE — Patient Instructions (Addendum)
1/2 Glipizide at night  Please check blood sugars at least half the time about 2 hours after any meal and as directed on waking up. Please bring blood sugar monitor to each visit

## 2014-01-21 NOTE — Progress Notes (Signed)
Patient ID: Abigail [name], female   DOB: 11-29-1950, 63 y.o.   MRN: [id number]   Reason for Appointment : Followup for Type 2 Diabetes  History of Present Illness          Diagnosis: Type 2 diabetes mellitus, date of diagnosis: 2004 ?        Past history: She initially had nonspecific symptoms of shakiness at diagnosis and had only mild hyperglycemia. Subsequently was started on metformin low doses for treatment The dose of metformin was gradually increased and she thinks she has been taking 1000 mg twice a day for at least a year. She did have nausea with increasing the dose; however this is better now although not resolved. She probably tried Byetta several years ago but she believes it did not help her control. She has been managed with metformin alone for the last year and a half but blood sugars have been progressively higher.  Blood sugars prior to her initial consultation were mostly over 200, up to 315 and her A1c was 10.3%  Recent history:   Her blood sugars had improved with adding Victoza in 9/14.  Currently using 1.2 mg and has no GI side effects. Since her fasting readings continued to be significantly high she was given a trial of glipizide to take at bedtime. She was asked to start with 2.5 mg and go up to 5 mg if morning sugars are not better but she increased the dose on her own and is continuing to take 5 mg at night With this her blood sugars are much better in the morning, was previously averaging about 175 and now about 110 A1c is excellent now Side effects from medications: She did have nausea with Amaryl  Glucose monitoring:  1.7 times a day        Glucometer:  One Touch Verio Blood Glucose readings from download:   PREMEAL Breakfast Lunch Dinner Bedtime Overall  Glucose range:  69-152       Mean/median:  110      130    POST-MEAL PC Breakfast PC Lunch PC Dinner  Glucose range:  218    118-195   Mean/median:    157    Hypoglycemia: None, did feel shaky  this morning before breakfast but glucose normal. No symptoms with glucose of 69          Self-care: The diet that the patient has been following EL:[id number] to limit fat intake, portions and sweets. Will have fruit for snacks.  Eating out 2-3 times a week, rarely fast food    Exercise: Walking in the morning 30 minutes  4-5/7 days for several months.          Dietician visit: Most recent: Years ago. Has seen nurse educator.          Retinal exam:  annual She has had a weight range of 168-185 in the last few years  Wt Readings from Last 3 Encounters:  01/21/14 159 lb 12.8 oz (72.485 kg)  12/03/13 159 lb 11.2 oz (72.439 kg)  10/01/13 164 lb 11.2 oz (74.707 kg)  . LABS:  Lab Results  Component Value Date   HGBA1C 6.6* 01/14/2014   HGBA1C 7.6* 09/26/2013   HGBA1C 10.3* 07/24/2013   Lab Results  Component Value Date   MICROALBUR 3.0* 07/24/2013   LDLCALC 61 09/26/2013   CREATININE 1.1 01/14/2014       Medication List       This list is accurate as of: 01/21/14  8:40 AM.  Always use your most recent med list.               aspirin 81 MG tablet  Take 81 mg by mouth as needed.     atorvastatin 20 MG tablet  Commonly known as:  LIPITOR  Take 1 tablet (20 mg total) by mouth daily.     FLUoxetine 20 MG capsule  Commonly known as:  PROZAC  Take 1 capsule (20 mg total) by mouth daily.     glipiZIDE 5 MG tablet  Commonly known as:  GLUCOTROL  Take 1 tablet (5 mg total) by mouth at bedtime.     glucose blood test strip  Commonly known as:  ONETOUCH VERIO  Use as instructed to check blood sugars two times a day     Insulin Pen Needle 32G X 5 MM Misc  Commonly known as:  NOVOTWIST  Inject 1 each into the skin daily.     Liraglutide 18 MG/3ML Sopn  Commonly known as:  VICTOZA  Inject 1.2 mg into the skin daily.     losartan 25 MG tablet  Commonly known as:  COZAAR  Take 1 tablet (25 mg total) by mouth daily.     metFORMIN 1000 MG tablet  Commonly known as:  GLUCOPHAGE   Take 1 tablet (1,000 mg total) by mouth 2 (two) times daily with a meal.     omeprazole 20 MG capsule  Commonly known as:  PRILOSEC  Take 1 capsule (20 mg total) by mouth daily.     traZODone 150 MG tablet  Commonly known as:  DESYREL  Take 1 tablet (150 mg total) by mouth at bedtime.        Allergies:  Allergies  Allergen Reactions  . Amaryl [Glimepiride] Nausea Only  . Cephalexin Hives  . Sulfa Antibiotics Other (See Comments)    Unknown by pt    Past Medical History  Diagnosis Date  . Diabetes mellitus   . Hypertension   . Hyperlipidemia   . Anxiety     Past Surgical History  Procedure Laterality Date  . Ovarian cyst removal    . Hammer toe surgery      Family History  Problem Relation Age of Onset  . Diabetes Mother   . Diabetes Father   . Heart disease Father   . Heart disease Brother     Social History:  reports that she has never smoked. She does not have any smokeless tobacco history on file. She reports that she drinks about 0.6 ounces of alcohol per week. She reports that she does not use illicit drugs.    Review of Systems       Lipids:  on treatment with Lipitor for years, LDL is  excellent    Lab Results  Component Value Date   CHOL 120 09/26/2013   HDL 46.30 09/26/2013   LDLCALC 61 09/26/2013   TRIG 66.0 09/26/2013   CHOLHDL 3 09/26/2013       The blood pressure has been normal, taking 25 mg Cozaar for "kidney protection"         No history of Numbness, tingling or burning in her feet. May have a little numb feeling after sitting for long   LABS:  No visits with results within 1 Week(s) from this visit. Latest known visit with results is:  Appointment on 01/14/2014  Component Date Value Ref Range Status  . Sodium 01/14/2014 138  135 - 145 mEq/L Final  . Potassium  01/14/2014 3.9  3.5 - 5.1 mEq/L Final  . Chloride 01/14/2014 101  96 - 112 mEq/L Final  . CO2 01/14/2014 29  19 - 32 mEq/L Final  . Glucose, Bld 01/14/2014 91  70 -  99 mg/dL Final  . BUN 01/14/2014 16  6 - 23 mg/dL Final  . Creatinine, Ser 01/14/2014 1.1  0.4 - 1.2 mg/dL Final  . Total Bilirubin 01/14/2014 0.3  0.3 - 1.2 mg/dL Final  . Alkaline Phosphatase 01/14/2014 67  39 - 117 U/L Final  . AST 01/14/2014 16  0 - 37 U/L Final  . ALT 01/14/2014 16  0 - 35 U/L Final  . Total Protein 01/14/2014 7.3  6.0 - 8.3 g/dL Final  . Albumin 01/14/2014 4.1  3.5 - 5.2 g/dL Final  . Calcium 01/14/2014 9.2  8.4 - 10.5 mg/dL Final  . GFR 01/14/2014 56.02* >60.00 mL/min Final  . Hemoglobin A1C 01/14/2014 6.6* 4.6 - 6.5 % Final   Glycemic Control Guidelines for People with Diabetes:Non Diabetic:  <6%Goal of Therapy: <7%Additional Action Suggested:  >8%     Physical Examination:  BP 124/76  Pulse 126  Temp(Src) 97.8 F (36.6 C)  Resp 16  Ht 5\' 5"  (1.651 m)  Wt 159 lb 12.8 oz (72.485 kg)  BMI 26.59 kg/m2  SpO2 96%    Repeat pulse 108, regular   Thyroid not palpable. Biceps reflexes normal, no tremor  ASSESSMENT/PLAN:  Diabetes type 2, uncontrolled   Her blood sugars are significantly better now with  regimen of  low-dose glipizide at night, Victoza and maximum dose metformin  Fasting blood sugars have improved with glipizide Since readings are low normal in the morning will reduce the dose to 2.5 mg Overall has fairly good readings after supper although has some fluctuation based on her diet Discussed needing to check some readings after breakfast and lunch also A1c is excellent at 6.6 Currently she is very compliant with diet and exercise  Fatigue: She is asking about B12 injections but not clear if she has been anemic She appears to have relatively fast pulse rate without any typical symptoms of hyperthyroidism, will check TSH on the next visit  We'll have her followup in 3 months  Elnita Surprenant 01/21/2014, 8:40 AM

## 2014-01-31 ENCOUNTER — Other Ambulatory Visit: Payer: Self-pay | Admitting: Endocrinology

## 2014-02-06 ENCOUNTER — Ambulatory Visit (INDEPENDENT_AMBULATORY_CARE_PROVIDER_SITE_OTHER): Payer: Federal, State, Local not specified - PPO | Admitting: Family Medicine

## 2014-02-06 VITALS — BP 120/78 | HR 87 | Temp 97.8°F | Resp 16 | Ht 65.0 in | Wt 160.4 lb

## 2014-02-06 DIAGNOSIS — F329 Major depressive disorder, single episode, unspecified: Secondary | ICD-10-CM

## 2014-02-06 DIAGNOSIS — E119 Type 2 diabetes mellitus without complications: Secondary | ICD-10-CM

## 2014-02-06 DIAGNOSIS — G4700 Insomnia, unspecified: Secondary | ICD-10-CM

## 2014-02-06 DIAGNOSIS — F419 Anxiety disorder, unspecified: Secondary | ICD-10-CM

## 2014-02-06 DIAGNOSIS — F411 Generalized anxiety disorder: Secondary | ICD-10-CM

## 2014-02-06 DIAGNOSIS — E785 Hyperlipidemia, unspecified: Secondary | ICD-10-CM

## 2014-02-06 DIAGNOSIS — K219 Gastro-esophageal reflux disease without esophagitis: Secondary | ICD-10-CM

## 2014-02-06 DIAGNOSIS — I10 Essential (primary) hypertension: Secondary | ICD-10-CM

## 2014-02-06 DIAGNOSIS — F32A Depression, unspecified: Secondary | ICD-10-CM

## 2014-02-06 MED ORDER — ATORVASTATIN CALCIUM 20 MG PO TABS: 20.0000 mg | ORAL_TABLET | Freq: Every day | ORAL | Status: DC

## 2014-02-06 MED ORDER — FLUOXETINE HCL 20 MG PO CAPS: 20.0000 mg | ORAL_CAPSULE | Freq: Every day | ORAL | Status: DC

## 2014-02-06 MED ORDER — OMEPRAZOLE 20 MG PO CPDR: 20.0000 mg | DELAYED_RELEASE_CAPSULE | Freq: Every day | ORAL | Status: DC

## 2014-02-06 MED ORDER — TRAZODONE HCL 150 MG PO TABS: 150.0000 mg | ORAL_TABLET | Freq: Every day | ORAL | Status: DC

## 2014-02-06 MED ORDER — LOSARTAN POTASSIUM 25 MG PO TABS: 25.0000 mg | ORAL_TABLET | Freq: Every day | ORAL | Status: DC

## 2014-02-06 NOTE — Progress Notes (Signed)
Patient ID: TATIYANNA LASHLEY, female   DOB: Apr 29, 1951, 63 y.o.   MRN: [id number]   Subjective:  This chart was scribed for Wardell Honour, MD by Marcha Dutton, ED Scribe. This patient was seen in room Room/bed info not found and the patient's care was started at 8:00 PM.   Patient ID: [name], female    DOB: September 27, 1951, 63 y.o.   MRN: [id number]  02/06/2014  Medication Refill   HPI HPI Comments: AMNA WELKER is a 63 y.o. female who presents to the [facility] for a follow up to get her prescriptions refilled. Pt states she lives in [place] but works in [place] at [employer]. Pt state she takes trazodone for her insomnia and believes it's working very well. She sleeps soundly at night and has no difficulties falling asleep. She states her depression is much improved and her Prozac at it's current dosage is perfect. and she has urged her husband to see someone for his depression. She reports he recently saw Dr. Everlene Farrier.   Dr Lorelei Pont started her on losartan to protect her kidneys from her DM. She states she's taking Lipitor for her cholesterol and it will be checked in June. She states her Prilosec is working well for her as well. She reports she got her flu shot and hasn't had the flu.   She states her Mother is 68 and had a minor TIA. She reports that her Father passed at 31 and that he had DM with associated vision loss and comas and 2 heart attacks. She states he was 58 at his first MI. Pt reports he had his second in his 36s and found out he had DM. Pt reports sister died at 75 in a MVC coming [place] from the beach. She reports her brother has a defibrillator implanted and a history of CHF.   She reports she has been married for 45 years and has 2 children and 7 grandbabies. She states she is a nonsmoker and occasionally has a glass of wine. She reports she walks about three days a week for 30 minutes in the morning before she  goes to work.   She states she goes to [facility] for her pap smear. She reports losing 14 lbs since September by watching her food portions and credits also her victoza. Pt denies CP, numbness, or tingling in her legs. She denies h/o high blood pressure. She reports mild right ear pain perhaps related to her seasonal allergies.  Review of Systems  Constitutional: Negative for fever, chills, diaphoresis and fatigue.  HENT: Positive for congestion and ear pain.   Eyes: Negative for visual disturbance.  Respiratory: Negative for cough and shortness of breath.   Cardiovascular: Negative for chest pain, palpitations and leg swelling.  Gastrointestinal: Negative for nausea, vomiting, abdominal pain, diarrhea and constipation.  Endocrine: Negative for cold intolerance, heat intolerance, polydipsia, polyphagia and polyuria.  Skin: Negative for color change, pallor, rash and wound.  Neurological: Negative for dizziness, tremors, seizures, syncope, facial asymmetry, speech difficulty, weakness, light-headedness, numbness and headaches.  Psychiatric/Behavioral: Negative for sleep disturbance and dysphoric mood. The patient is not nervous/anxious.     Past Medical History  Diagnosis Date  . Diabetes mellitus   . Hypertension   . Hyperlipidemia   . Anxiety   . GERD (gastroesophageal reflux disease)    Allergies  Allergen Reactions  . Amaryl [Glimepiride] Nausea Only  . Cephalexin Hives  . Sulfa Antibiotics Other (See  Comments)    Unknown by pt   Current Outpatient Prescriptions  Medication Sig Dispense Refill  . aspirin 81 MG tablet Take 81 mg by mouth as needed.    [name] atorvastatin (LIPITOR) 20 MG tablet Take 1 tablet (20 mg total) by mouth daily. 90 tablet 3  . FLUoxetine (PROZAC) 20 MG capsule Take 1 capsule (20 mg total) by mouth daily. 90 capsule 3  . losartan (COZAAR) 25 MG tablet Take 1 tablet (25 mg total) by mouth daily. 90 tablet 3  . omeprazole (PRILOSEC) 20 MG capsule  Take 1 capsule (20 mg total) by mouth daily. 90 capsule 3  . traZODone (DESYREL) 150 MG tablet Take 1 tablet (150 mg total) by mouth at bedtime. 90 tablet 3  . glipiZIDE (GLUCOTROL) 5 MG tablet TAKE 1 TABLET BY MOUTH EVERY NIGHT AT BEDTIME 30 tablet 3  . metFORMIN (GLUCOPHAGE) 1000 MG tablet TAKE 1 TABLET BY MOUTH TWICE DAILY WITH A MEAL 60 tablet 3  . ONETOUCH VERIO test strip TEST BLOOD SUGARS TWICE DAILY 100 each 3  . VICTOZA 18 MG/3ML SOPN INJECT 1.2 MG INTO THE SKIN DAILY 6 mL 3   No current facility-administered medications for this visit.       Objective:    Triage Vitals: BP 120/78  Pulse 87  Temp(Src) 97.8 F (36.6 C) (Oral)  Resp 16  Ht 5\' 5"  (1.651 m)  Wt 160 lb 6.4 oz (72.757 kg)  BMI 26.69 kg/m2  SpO2 96%  Physical Exam  Constitutional: She is oriented to person, place, and time. She appears well-developed and well-nourished. No distress.  HENT:  Head: Normocephalic and atraumatic.  Right Ear: External ear normal.  Left Ear: External ear normal.  Nose: Nose normal.  Mouth/Throat: Oropharynx is clear and moist.  Eyes: Conjunctivae and EOM are normal. Pupils are equal, round, and reactive to light.  Neck: Normal range of motion. Neck supple. Carotid bruit is not present. No tracheal deviation present. No thyromegaly present.  Cardiovascular: Normal rate, regular rhythm, normal heart sounds and intact distal pulses.  Exam reveals no gallop and no friction rub.   No murmur heard. Pulmonary/Chest: Effort normal and breath sounds normal. No respiratory distress. She has no wheezes. She has no rales.  Abdominal: Soft. Bowel sounds are normal. She exhibits no distension and no mass. There is no tenderness. There is no rebound and no guarding.  Musculoskeletal: Normal range of motion. She exhibits no edema or tenderness.  Lymphadenopathy:    She has no cervical adenopathy.  Neurological: She is alert and oriented to person, place, and time. She has normal reflexes. No  cranial nerve deficit. Coordination normal.  Skin: Skin is warm and dry. No rash noted. She is not diaphoretic. No erythema. No pallor.  Psychiatric: She has a normal mood and affect. Her behavior is normal.  Nursing note and vitals reviewed.  Results for orders placed or performed in visit on 01/14/14  Comprehensive metabolic panel  Result Value Ref Range   Sodium 138 135 - 145 mEq/L   Potassium 3.9 3.5 - 5.1 mEq/L   Chloride 101 96 - 112 mEq/L   CO2 29 19 - 32 mEq/L   Glucose, Bld 91 70 - 99 mg/dL   BUN 16 6 - 23 mg/dL   Creatinine, Ser 1.1 0.4 - 1.2 mg/dL   Total Bilirubin 0.3 0.3 - 1.2 mg/dL   Alkaline Phosphatase 67 39 - 117 U/L   AST 16 0 - 37 U/L   ALT 16  0 - 35 U/L   Total Protein 7.3 6.0 - 8.3 g/dL   Albumin 4.1 3.5 - 5.2 g/dL   Calcium 9.2 8.4 - 10.5 mg/dL   GFR 56.02 (L) >60.00 mL/min  Hemoglobin A1c  Result Value Ref Range   Hgb A1c MFr Bld 6.6 (H) 4.6 - 6.5 %       Assessment & Plan:  Anxiety  Insomnia  Other and unspecified hyperlipidemia  Hyperlipidemia - Plan: atorvastatin (LIPITOR) 20 MG tablet  Depression - Plan: FLUoxetine (PROZAC) 20 MG capsule, traZODone (DESYREL) 150 MG tablet  Unspecified essential hypertension - Plan: losartan (COZAAR) 25 MG tablet  GERD (gastroesophageal reflux disease) - Plan: omeprazole (PRILOSEC) 20 MG capsule  Type 2 diabetes mellitus without complication   1. Anxiety: controlled; refill of Prozac provided. 2. Insomnia: controlled; refill of Trazodone provided. 3. Hyperlipidemia: controlled; RTC in June for fasting labs; refill provided. 4.  HTN: controlled; refill of Losartan provided. 5. GERD: controlled.  Refill provided. 6. DMII: controlled; continue current medications.  Managed by Dwyane Dee.   Meds ordered this encounter  Medications  . atorvastatin (LIPITOR) 20 MG tablet    Sig: Take 1 tablet (20 mg total) by mouth daily.    Dispense:  90 tablet    Refill:  3  . FLUoxetine (PROZAC) 20 MG capsule    Sig:  Take 1 capsule (20 mg total) by mouth daily.    Dispense:  90 capsule    Refill:  3  . losartan (COZAAR) 25 MG tablet    Sig: Take 1 tablet (25 mg total) by mouth daily.    Dispense:  90 tablet    Refill:  3  . traZODone (DESYREL) 150 MG tablet    Sig: Take 1 tablet (150 mg total) by mouth at bedtime.    Dispense:  90 tablet    Refill:  3    Order Specific Question:  Supervising Provider    Answer:  DOOLITTLE, ROBERT P [[id number]]  . omeprazole (PRILOSEC) 20 MG capsule    Sig: Take 1 capsule (20 mg total) by mouth daily.    Dispense:  90 capsule    Refill:  3    Return in about 6 months (around 08/08/2014) for complete physical examiniation.   I personally performed the services described in this documentation, which was scribed in my presence.  The recorded information has been reviewed and is accurate.  Reginia Forts, M.D.  [facility] [address] [place], [place]  [address] [phone] phone [phone] fax

## 2014-02-13 NOTE — Progress Notes (Signed)
Left a message for patient to return call to schedule appointment in six months for cpe

## 2014-03-15 ENCOUNTER — Other Ambulatory Visit: Payer: Self-pay | Admitting: Family Medicine

## 2014-03-26 ENCOUNTER — Ambulatory Visit (INDEPENDENT_AMBULATORY_CARE_PROVIDER_SITE_OTHER): Payer: Federal, State, Local not specified - PPO | Admitting: Family Medicine

## 2014-03-26 ENCOUNTER — Ambulatory Visit
Admission: RE | Admit: 2014-03-26 | Discharge: 2014-03-26 | Disposition: A | Discharge status: Home / Self Care | Payer: Federal, State, Local not specified - PPO | Source: Ambulatory Visit | Attending: Family Medicine | Admitting: Family Medicine

## 2014-03-26 ENCOUNTER — Ambulatory Visit: Payer: Federal, State, Local not specified - PPO

## 2014-03-26 ENCOUNTER — Encounter: Payer: Self-pay | Admitting: Family Medicine

## 2014-03-26 VITALS — BP 132/80 | HR 89 | Temp 98.0°F | Resp 17 | Ht 65.0 in | Wt 157.0 lb

## 2014-03-26 DIAGNOSIS — R109 Unspecified abdominal pain: Secondary | ICD-10-CM

## 2014-03-26 LAB — POCT UA - MICROSCOPIC ONLY
Bacteria, U Microscopic: NEGATIVE
Casts, Ur, LPF, POC: NEGATIVE
Crystals, Ur, HPF, POC: NEGATIVE
Mucus, UA: NEGATIVE
Yeast, UA: NEGATIVE

## 2014-03-26 LAB — POCT CBC
Granulocyte percent: 66.5 %G (ref 37–80)
HCT, POC: 41.5 % (ref 37.7–47.9)
Hemoglobin: 13.4 g/dL (ref 12.2–16.2)
Lymph, poc: 2.3 (ref 0.6–3.4)
MCH, POC: 27.4 pg (ref 27–31.2)
MCHC: 32.3 g/dL (ref 31.8–35.4)
MCV: 84.9 fL (ref 80–97)
MID (cbc): 0.4 (ref 0–0.9)
MPV: 6.9 fL (ref 0–99.8)
POC Granulocyte: 5.3 (ref 2–6.9)
POC LYMPH PERCENT: 28.7 %L (ref 10–50)
POC MID %: 4.8 %M (ref 0–12)
Platelet Count, POC: 284 10*3/uL (ref 142–424)
RBC: 4.89 M/uL (ref 4.04–5.48)
RDW, POC: 14 %
WBC: 7.9 10*3/uL (ref 4.6–10.2)

## 2014-03-26 LAB — POCT URINALYSIS DIPSTICK
Bilirubin, UA: NEGATIVE
Glucose, UA: NEGATIVE
Leukocytes, UA: NEGATIVE
Nitrite, UA: NEGATIVE
Spec Grav, UA: >=1.03
Urobilinogen, UA: 0.2
pH, UA: 5.5

## 2014-03-26 NOTE — Progress Notes (Signed)
Subjective:    Patient ID: Abigail [name], female    DOB: 1951/05/14, 63 y.o.   MRN: [id number]  HPI 63 year old pleasant female presents for evaluation of abdominal pain, nausea, and diarrhea x 10 days. States it has been intermittent in nature having "good days" with 1-2 episodes of loose stool/diarrhea and "bad day" having 4-5 episodes of diarrhea.  Pain was constant last night but not worse. Has pain in RLQ and RUQ.  She has not had a normal, formed BM since onset.    Denies hematochezia or melena.  No dysuria, urinary frequency, flank pian, vomiting, dizziness, or constipation.   Hx of similar illness "years" ago - worked up for gallbladder disease that came back negative. Sx's resolved spontaneously.   No new meds or recent travel  No abdominal surgeries.  Wt Readings from Last 3 Encounters:  03/26/14 157 lb (71.215 kg)  02/06/14 160 lb 6.4 oz (72.757 kg)  01/21/14 159 lb 12.8 oz (72.485 kg)   She did eat toast at 9:30.  NPO since then except for water  Review of Systems  Constitutional: Positive for chills. Negative for fever.  Gastrointestinal: Positive for nausea, abdominal pain and diarrhea. Negative for vomiting, constipation and blood in stool.  Genitourinary: Negative for dysuria, frequency, hematuria and flank pain.       Objective:   Physical Exam  Constitutional: She is oriented to person, place, and time. She appears well-developed and well-nourished.  HENT:  Head: Normocephalic and atraumatic.  Right Ear: External ear normal.  Left Ear: External ear normal.  Eyes: Conjunctivae are normal.  Neck: Normal range of motion.  Cardiovascular: Normal rate, regular rhythm and normal heart sounds.   Pulmonary/Chest: Effort normal and breath sounds normal.  Abdominal: Soft. Normal appearance and bowel sounds are normal. There is tenderness in the right upper quadrant and right lower quadrant. There is no rigidity, no rebound, no guarding, no CVA tenderness and  negative Murphy's sign.  Neurological: She is alert and oriented to person, place, and time.  Psychiatric: She has a normal mood and affect. Her behavior is normal. Judgment and thought content normal.   Re- examined abdomen later during visit.  RUQ tenderness only, no RLQ tenderness  [facility] reading (PRIMARY) by  Dr. Lorelei [name]. abd series: negative  ABDOMEN - 1 VIEW  COMPARISON: CT 04/18/2011.  FINDINGS:  Soft tissue structures are unremarkable. Gas pattern nonspecific.  Tiny punctate calcification left pelvis, most likely phlebolith.  Degenerative changes lumbar spine and both hips.  IMPRESSION:  No significant abnormality.    Results for orders placed in visit on 03/26/14  POCT CBC      Result Value Ref Range   WBC 7.9  4.6 - 10.2 K/uL   Lymph, poc 2.3  0.6 - 3.4   POC LYMPH PERCENT 28.7  10 - 50 %L   MID (cbc) 0.4  0 - 0.9   POC MID % 4.8  0 - 12 %M   POC Granulocyte 5.3  2 - 6.9   Granulocyte percent 66.5  37 - 80 %G   RBC 4.89  4.04 - 5.48 M/uL   Hemoglobin 13.4  12.2 - 16.2 g/dL   HCT, POC 41.5  37.7 - 47.9 %   MCV 84.9  80 - 97 fL   MCH, POC 27.4  27 - 31.2 pg   MCHC 32.3  31.8 - 35.4 g/dL   RDW, POC 14.0     Platelet Count, POC 284  142 -  424 K/uL   MPV 6.9  0 - 99.8 fL  POCT UA - MICROSCOPIC ONLY      Result Value Ref Range   WBC, Ur, HPF, POC 1-2     RBC, urine, microscopic 1-2     Bacteria, U Microscopic neg     Mucus, UA neg     Epithelial cells, urine per micros 1-3     Crystals, Ur, HPF, POC neg     Casts, Ur, LPF, POC neg     Yeast, UA neg    POCT URINALYSIS DIPSTICK      Result Value Ref Range   Color, UA yellow     Clarity, UA cloudy     Glucose, UA neg     Bilirubin, UA neg     Ketones, UA trace     Spec Grav, UA >=1.030     Blood, UA mod     pH, UA 5.5     Protein, UA trace     Urobilinogen, UA 0.2     Nitrite, UA neg     Leukocytes, UA Negative      Assessment & Plan:  Abdominal pain, unspecified site - Plan: POCT CBC, POCT UA -  Microscopic Only, POCT urinalysis dipstick, Comprehensive metabolic panel, DG Abd 1 View, US Abdomen Limited RUQ  Abigail [name] is here today with about 10 days of non- specific abdominal discomfort and bowel habit changes.  Colonoscopy is UTD.  Discussed further imaging vs observation.  We can do a CT or ultrasound.  She is most concerned about her gallbladder and would prefer to start with [place], which is reasonable.  Doubt acute appendicitis as her wbc count is normal, she is non- toxic and still eating normally  Scheduled ultrasound at 4:15 at [facility] [address]; called and LMOM for her with these details.    Ultrasound looks fine.  Called to let her know LMOM, will send to her mychart and look out for her CMP.  If any worsening let me know!     The gallbladder is visualized and no gallstones are noted. There is no pain over the gallbladder with compression.  Common bile duct:  Diameter: The common bile duct is normal measuring 4.4 mm in diameter distally.  Liver:  The liver is slightly inhomogeneous. A cyst is present in the right lobe of 1.2 cm.  IMPRESSION: 1. No gallstones. No ductal dilatation. 2. Slightly inhomogeneous echogenicity of the liver.

## 2014-03-26 NOTE — Patient Instructions (Signed)
We will try to get your ultrasound done today, nothing to eat or drink until we talk!  I will also be in touch with your other labs asap.

## 2014-03-27 LAB — COMPREHENSIVE METABOLIC PANEL
ALT: 13 U/L (ref 0–35)
AST: 13 U/L (ref 0–37)
Albumin: 4.1 g/dL (ref 3.5–5.2)
Alkaline Phosphatase: 68 U/L (ref 39–117)
BUN: 16 mg/dL (ref 6–23)
CO2: 27 mEq/L (ref 19–32)
Calcium: 9.4 mg/dL (ref 8.4–10.5)
Chloride: 102 mEq/L (ref 96–112)
Creat: 0.97 mg/dL (ref 0.50–1.10)
Glucose, Bld: 93 mg/dL (ref 70–99)
Potassium: 4.6 mEq/L (ref 3.5–5.3)
Sodium: 139 mEq/L (ref 135–145)
Total Bilirubin: 0.3 mg/dL (ref 0.2–1.2)
Total Protein: 6.9 g/dL (ref 6.0–8.3)

## 2014-04-01 ENCOUNTER — Other Ambulatory Visit: Payer: Self-pay | Admitting: Endocrinology

## 2014-04-02 ENCOUNTER — Other Ambulatory Visit: Payer: Self-pay | Admitting: Family Medicine

## 2014-04-11 ENCOUNTER — Other Ambulatory Visit: Payer: Self-pay | Admitting: Endocrinology

## 2014-04-15 ENCOUNTER — Other Ambulatory Visit (INDEPENDENT_AMBULATORY_CARE_PROVIDER_SITE_OTHER): Payer: Federal, State, Local not specified - PPO

## 2014-04-15 DIAGNOSIS — E785 Hyperlipidemia, unspecified: Secondary | ICD-10-CM

## 2014-04-15 DIAGNOSIS — R5381 Other malaise: Secondary | ICD-10-CM

## 2014-04-15 DIAGNOSIS — E1165 Type 2 diabetes mellitus with hyperglycemia: Principal | ICD-10-CM

## 2014-04-15 DIAGNOSIS — R5383 Other fatigue: Secondary | ICD-10-CM

## 2014-04-15 DIAGNOSIS — IMO0001 Reserved for inherently not codable concepts without codable children: Secondary | ICD-10-CM

## 2014-04-15 LAB — COMPREHENSIVE METABOLIC PANEL
ALT: 20 U/L (ref 0–35)
AST: 21 U/L (ref 0–37)
Albumin: 3.7 g/dL (ref 3.5–5.2)
Alkaline Phosphatase: 63 U/L (ref 39–117)
BUN: 18 mg/dL (ref 6–23)
CO2: 27 meq/L (ref 19–32)
CREATININE: 1 mg/dL (ref 0.4–1.2)
Calcium: 8.9 mg/dL (ref 8.4–10.5)
Chloride: 103 mEq/L (ref 96–112)
GFR: 62.07 mL/min (ref >60.00)
Glucose, Bld: 109 mg/dL — ABNORMAL HIGH (ref 70–99)
Potassium: 4.3 mEq/L (ref 3.5–5.1)
Sodium: 140 mEq/L (ref 135–145)
Total Bilirubin: 0.5 mg/dL (ref 0.2–1.2)
Total Protein: 6.2 g/dL (ref 6.0–8.3)

## 2014-04-15 LAB — CBC
HCT: 36.9 % (ref 36.0–46.0)
Hemoglobin: 12 g/dL (ref 12.0–15.0)
MCHC: 32.5 g/dL (ref 30.0–36.0)
MCV: 82.8 fl (ref 78.0–100.0)
Platelets: 233 10*3/uL (ref 150.0–400.0)
RBC: 4.46 Mil/uL (ref 3.87–5.11)
RDW: 13.8 % (ref 11.5–15.5)
WBC: 6.1 10*3/uL (ref 4.0–10.5)

## 2014-04-15 LAB — TSH: TSH: 1.5 u[IU]/mL (ref 0.35–4.50)

## 2014-04-15 LAB — LIPID PANEL
CHOL/HDL RATIO: 3
Cholesterol: 140 mg/dL (ref 0–200)
HDL: 50.5 mg/dL (ref >39.00)
LDL CALC: 76 mg/dL (ref 0–99)
NonHDL: 89.5
Triglycerides: 70 mg/dL (ref 0.0–149.0)
VLDL: 14 mg/dL (ref 0.0–40.0)

## 2014-04-15 LAB — HEMOGLOBIN A1C: HEMOGLOBIN A1C: 6.2 % (ref 4.6–6.5)

## 2014-04-15 LAB — MICROALBUMIN / CREATININE URINE RATIO
CREATININE, U: 267.8 mg/dL
MICROALB UR: 3.4 mg/dL — AB (ref 0.0–1.9)
Microalb Creat Ratio: 1.3 mg/g (ref 0.0–30.0)

## 2014-04-22 ENCOUNTER — Ambulatory Visit (INDEPENDENT_AMBULATORY_CARE_PROVIDER_SITE_OTHER): Payer: Federal, State, Local not specified - PPO | Admitting: Endocrinology

## 2014-04-22 ENCOUNTER — Encounter: Payer: Self-pay | Admitting: Endocrinology

## 2014-04-22 VITALS — BP 132/78 | HR 97 | Temp 98.1°F | Resp 14 | Ht 65.0 in | Wt 157.4 lb

## 2014-04-22 DIAGNOSIS — IMO0001 Reserved for inherently not codable concepts without codable children: Secondary | ICD-10-CM

## 2014-04-22 DIAGNOSIS — E785 Hyperlipidemia, unspecified: Secondary | ICD-10-CM

## 2014-04-22 DIAGNOSIS — E1165 Type 2 diabetes mellitus with hyperglycemia: Principal | ICD-10-CM

## 2014-04-22 NOTE — Patient Instructions (Signed)
Please check blood sugars at least half the time about 2 hours after any meal and times per week on waking up. Please bring blood sugar monitor to each visit  Increase walking

## 2014-04-22 NOTE — Progress Notes (Signed)
Patient ID: Abigail [name], female   DOB: 1951-01-31, 63 y.o.   MRN: [id number]   Reason for Appointment : Followup for Type 2 Diabetes  History of Present Illness          Diagnosis: Type 2 diabetes mellitus, date of diagnosis: 2004 ?        Past history: She initially had nonspecific symptoms of shakiness at diagnosis and had only mild hyperglycemia. Subsequently was started on metformin low doses for treatment The dose of metformin was gradually increased and she thinks she has been taking 1000 mg twice a day for at least a year. She did have nausea with increasing the dose; however this is better now although not resolved. She probably tried Byetta several years ago but she believes it did not help her control. She has been managed with metformin alone for the last year and a half but blood sugars have been progressively higher.  Blood sugars prior to her initial consultation were mostly over 200, up to 315 and her A1c was 10.3%  Recent history:   Her blood sugars had improved with adding Victoza in 9/14.  Currently using 1.2 mg and has no GI side effects. Since her fasting readings continued to be significantly high she was given a trial of glipizide to take at bedtime. She had started with 5 mg and with blood sugars being low normal on her last visit this was reduced to 2.5 mg She has fairly stable blood sugars although somewhat higher in the last 10 days in the mornings Overall she thinks that her sugars tend to cope with stress and this has interfered with her ability to exercise regularly also Has only a few readings after meals and these appear to be fairly good Her A1c is the best in quite some time  Side effects from medications: She did have nausea with Amaryl  Glucose monitoring:  1.7 times a day        Glucometer:  One Touch Verio Blood Glucose readings from download:   PREMEAL Breakfast Lunch Dinner Bedtime Overall  Glucose range:  88-160     128-160    Mean/median:   124     148   126    Hypoglycemia: None      Self-care: The diet that the patient has been following [id number] to limit fat intake, portions and sweets. Will have fruit for snacks.  Eating out 2-3 times a week, rarely fast food    Exercise: Walking in the morning 30 minutes  3-4/7 days for several months.          Dietician visit: Most recent: Years ago. Has seen nurse educator.          Retinal exam:  annual She has had a weight range of 168-185 in the last few years  Wt Readings from Last 3 Encounters:  04/22/14 157 lb 6.4 oz (71.396 kg)  03/26/14 157 lb (71.215 kg)  02/06/14 160 lb 6.4 oz (72.757 kg)  . LABS:  Lab Results  Component Value Date   HGBA1C 6.2 04/15/2014   HGBA1C 6.6* 01/14/2014   HGBA1C 7.6* 09/26/2013   Lab Results  Component Value Date   MICROALBUR 3.4* 04/15/2014   LDLCALC 76 04/15/2014   CREATININE 1.0 04/15/2014       Medication List       This list is accurate as of: 04/22/14  8:38 AM.  Always use your most recent med list.  aspirin 81 MG tablet  Take 81 mg by mouth as needed.     atorvastatin 20 MG tablet  Commonly known as:  LIPITOR  Take 1 tablet (20 mg total) by mouth daily.     FLUoxetine 20 MG capsule  Commonly known as:  PROZAC  Take 1 capsule (20 mg total) by mouth daily.     glipiZIDE 5 MG tablet  Commonly known as:  GLUCOTROL  TAKE 1 TABLET BY MOUTH EVERY NIGHT AT BEDTIME     glucose blood test strip  Commonly known as:  ONETOUCH VERIO  Use as instructed to check blood sugars two times a day     losartan 25 MG tablet  Commonly known as:  COZAAR  Take 1 tablet (25 mg total) by mouth daily.     metFORMIN 1000 MG tablet  Commonly known as:  GLUCOPHAGE  TAKE 1 TABLET BY MOUTH TWO TIMES DAILY WITH A MEAL     omeprazole 20 MG capsule  Commonly known as:  PRILOSEC  Take 1 capsule (20 mg total) by mouth daily.     traZODone 150 MG tablet  Commonly known as:  DESYREL  Take 1 tablet (150 mg total) by mouth at  bedtime.     VICTOZA 18 MG/3ML Sopn  Generic drug:  Liraglutide  INJECT 1.2 MG INTO THE SKIN DAILY        Allergies:  Allergies  Allergen Reactions  . Amaryl [Glimepiride] Nausea Only  . Cephalexin Hives  . Sulfa Antibiotics Other (See Comments)    Unknown by pt    Past Medical History  Diagnosis Date  . Diabetes mellitus   . Hypertension   . Hyperlipidemia   . Anxiety   . GERD (gastroesophageal reflux disease)     Past Surgical History  Procedure Laterality Date  . Ovarian cyst removal    . Hammer toe surgery      Family History  Problem Relation Age of Onset  . Diabetes Mother   . Diabetes Father   . Heart disease Father 46    AMI x 2; first AMI age 48  . Heart disease Brother     defibrillator; CHF    Social History:  reports that she has never smoked. She does not have any smokeless tobacco history on file. She reports that she drinks about .6 ounces of alcohol per week. She reports that she does not use illicit drugs.    Review of Systems       Lipids:  on treatment with Lipitor for years, LDL is  excellent    Lab Results  Component Value Date   CHOL 140 04/15/2014   HDL 50.50 04/15/2014   LDLCALC 76 04/15/2014   TRIG 70.0 04/15/2014   CHOLHDL 3 04/15/2014       The blood pressure has been normal, taking 25 mg Cozaar for "kidney protection"         No history of Numbness, tingling or burning in her feet. May have a little numb feeling after sitting for long   LABS:  No visits with results within 1 Week(s) from this visit. Latest known visit with results is:  Appointment on 04/15/2014  Component Date Value Ref Range Status  . Cholesterol 04/15/2014 140  0 - 200 mg/dL Final   ATP III Classification       Desirable:  < 200 mg/dL               Borderline High:  200 - 239 mg/dL  High:  > = 240 mg/dL  . Triglycerides 04/15/2014 70.0  0.0 - 149.0 mg/dL Final   Normal:  <150 mg/dLBorderline High:  150 - 199 mg/dL  . HDL 04/15/2014 50.50  >39.00  mg/dL Final  . VLDL 04/15/2014 14.0  0.0 - 40.0 mg/dL Final  . LDL Cholesterol 04/15/2014 76  0 - 99 mg/dL Final  . Total CHOL/HDL Ratio 04/15/2014 3   Final                  Men          Women1/2 Average Risk     3.4          3.3Average Risk          5.0          4.42X Average Risk          9.6          7.13X Average Risk          15.0          11.0                      . NonHDL 04/15/2014 89.50   Final  . Microalb, Ur 04/15/2014 3.4* 0.0 - 1.9 mg/dL Final  . Creatinine,U 04/15/2014 267.8   Final  . Microalb Creat Ratio 04/15/2014 1.3  0.0 - 30.0 mg/g Final  . WBC 04/15/2014 6.1  4.0 - 10.5 K/uL Final  . RBC 04/15/2014 4.46  3.87 - 5.11 Mil/uL Final  . Platelets 04/15/2014 233.0  150.0 - 400.0 K/uL Final  . Hemoglobin 04/15/2014 12.0  12.0 - 15.0 g/dL Final  . HCT 04/15/2014 36.9  36.0 - 46.0 % Final  . MCV 04/15/2014 82.8  78.0 - 100.0 fl Final  . MCHC 04/15/2014 32.5  30.0 - 36.0 g/dL Final  . RDW 04/15/2014 13.8  11.5 - 15.5 % Final  . Hemoglobin A1C 04/15/2014 6.2  4.6 - 6.5 % Final   Glycemic Control Guidelines for People with Diabetes:Non Diabetic:  <6%Goal of Therapy: <7%Additional Action Suggested:  >8%   . TSH 04/15/2014 1.50  0.35 - 4.50 uIU/mL Final  . Sodium 04/15/2014 140  135 - 145 mEq/L Final  . Potassium 04/15/2014 4.3  3.5 - 5.1 mEq/L Final  . Chloride 04/15/2014 103  96 - 112 mEq/L Final  . CO2 04/15/2014 27  19 - 32 mEq/L Final  . Glucose, Bld 04/15/2014 109* 70 - 99 mg/dL Final  . BUN 04/15/2014 18  6 - 23 mg/dL Final  . Creatinine, Ser 04/15/2014 1.0  0.4 - 1.2 mg/dL Final  . Total Bilirubin 04/15/2014 0.5  0.2 - 1.2 mg/dL Final  . Alkaline Phosphatase 04/15/2014 63  39 - 117 U/L Final  . AST 04/15/2014 21  0 - 37 U/L Final  . ALT 04/15/2014 20  0 - 35 U/L Final  . Total Protein 04/15/2014 6.2  6.0 - 8.3 g/dL Final  . Albumin 04/15/2014 3.7  3.5 - 5.2 g/dL Final  . Calcium 04/15/2014 8.9  8.4 - 10.5 mg/dL Final  . GFR 04/15/2014 62.07  >60.00 mL/min Final     Physical Examination:  BP 148/82  Pulse 97  Temp(Src) 98.1 F (36.7 C)  Resp 14  Ht 5\' 5"  (1.651 m)  Wt 157 lb 6.4 oz (71.396 kg)  BMI 26.19 kg/m2  SpO2 96%    Repeat blood pressure is normal  ASSESSMENT/PLAN:  Diabetes type 2, uncontrolled   Her  blood sugars are continuing to be well controlled with her regimen of metformin, Victoza and low dose glipizide She is only requiring 2.5 mg glipizide at night Although her fasting blood sugars are trending higher essentially they are mostly in the 120- 130 range Since her A1c is back to normal and she is not having any postprandial hyperglycemia will not change her regimen as yet She does need to start increasing her exercise level and walk more regularly She does feel that her sugars may also improve with reduction in her stress level  History of fatigue: She had requested a thyroid check and this is normal  ? Hypertension: She is only taking 25 mg losartan and blood pressure is fairly good Consider increasing the dose if blood pressure tends to be higher   Dejion Grillo 04/22/2014, 8:38 AM

## 2014-05-15 ENCOUNTER — Telehealth: Payer: Self-pay | Admitting: Endocrinology

## 2014-05-15 NOTE — Telephone Encounter (Signed)
New meter up front and ready for patient to pick up, she is aware

## 2014-05-15 NOTE — Telephone Encounter (Signed)
Patient stated that she lost her diabetic meter and needs another one. Contact her on her work phone # [phone]

## 2014-05-30 ENCOUNTER — Ambulatory Visit (INDEPENDENT_AMBULATORY_CARE_PROVIDER_SITE_OTHER): Payer: Federal, State, Local not specified - PPO | Admitting: Family Medicine

## 2014-05-30 ENCOUNTER — Ambulatory Visit (HOSPITAL_COMMUNITY)
Admission: RE | Admit: 2014-05-30 | Discharge: 2014-05-30 | Disposition: A | Discharge status: Home / Self Care | Payer: Federal, State, Local not specified - PPO | Source: Ambulatory Visit | Attending: Family Medicine | Admitting: Family Medicine

## 2014-05-30 VITALS — BP 122/74 | HR 84 | Temp 98.0°F | Resp 18 | Ht 65.0 in | Wt 156.4 lb

## 2014-05-30 DIAGNOSIS — R1030 Lower abdominal pain, unspecified: Secondary | ICD-10-CM

## 2014-05-30 DIAGNOSIS — N949 Unspecified condition associated with female genital organs and menstrual cycle: Secondary | ICD-10-CM | POA: Insufficient documentation

## 2014-05-30 DIAGNOSIS — R109 Unspecified abdominal pain: Secondary | ICD-10-CM | POA: Insufficient documentation

## 2014-05-30 LAB — POCT CBC
GRANULOCYTE PERCENT: 64 % (ref 37–80)
HCT, POC: 40.1 % (ref 37.7–47.9)
Hemoglobin: 13 g/dL (ref 12.2–16.2)
Lymph, poc: 1.8 (ref 0.6–3.4)
MCH: 26.8 pg — AB (ref 27–31.2)
MCHC: 32.5 g/dL (ref 31.8–35.4)
MCV: 82.5 fL (ref 80–97)
MID (CBC): 0.5 (ref 0–0.9)
MPV: 5.8 fL (ref 0–99.8)
PLATELET COUNT, POC: 248 10*3/uL (ref 142–424)
POC Granulocyte: 4 (ref 2–6.9)
POC LYMPH %: 28.5 % (ref 10–50)
POC MID %: 7.5 %M (ref 0–12)
RBC: 4.85 M/uL (ref 4.04–5.48)
RDW, POC: 14.1 %
WBC: 6.3 10*3/uL (ref 4.6–10.2)

## 2014-05-30 LAB — POCT WET PREP WITH KOH
Bacteria Wet Prep HPF POC: NEGATIVE
Clue Cells Wet Prep HPF POC: NEGATIVE
KOH PREP POC: NEGATIVE
Trichomonas, UA: NEGATIVE
YEAST WET PREP PER HPF POC: NEGATIVE

## 2014-05-30 LAB — POCT UA - MICROSCOPIC ONLY
Bacteria, U Microscopic: NEGATIVE
CASTS, UR, LPF, POC: NEGATIVE
Crystals, Ur, HPF, POC: NEGATIVE
Mucus, UA: NEGATIVE
YEAST UA: NEGATIVE

## 2014-05-30 LAB — POCT URINALYSIS DIPSTICK
BILIRUBIN UA: NEGATIVE
Blood, UA: NEGATIVE
GLUCOSE UA: NEGATIVE
Ketones, UA: NEGATIVE
LEUKOCYTES UA: NEGATIVE
NITRITE UA: NEGATIVE
Protein, UA: NEGATIVE
Spec Grav, UA: 1.015
UROBILINOGEN UA: 0.2
pH, UA: 7

## 2014-05-30 NOTE — Progress Notes (Addendum)
[facility] [address], [place] [address] [phone]  Date:  05/30/2014   Name:  Abigail [name]   DOB:  1951/07/20   MRN:  [id number]  PCP:  Lamar Blinks, MD    Chief Complaint: Abdominal Pain   History of Present Illness:  Abigail [name] is a 63 y.o. very pleasant female patient who presents with the following:  History of high cholesterol, DM, anxiety, obesity.  Here today with lower abdominal pain for a couple of weeks.  It was worse the last few days, and especially since last night No hematuria, she has not noted any particular dysuria or frequency.  She has had some back ache.   She went through menopause in her 4s and has not had any vaginal bleeding.   No nausea or vomiting. No bowel habit changes or constipation  She has felt "sluggish."   She is eating ok.   She has not been aware of any fever.    Patient Active Problem List   Diagnosis Date Noted  . Other and unspecified hyperlipidemia 07/24/2013  . Chest pain 10/23/2012  . Type II or unspecified type diabetes mellitus without mention of complication, uncontrolled 10/08/2012  . Anxiety 10/08/2012    Past Medical History  Diagnosis Date  . Diabetes mellitus   . Hypertension   . Hyperlipidemia   . Anxiety   . GERD (gastroesophageal reflux disease)     Past Surgical History  Procedure Laterality Date  . Ovarian cyst removal    . Hammer toe surgery      History  Substance Use Topics  . Smoking status: Never Smoker   . Smokeless tobacco: Not on file  . Alcohol Use: 0.6 oz/week    1 Glasses of wine per week    Family History  Problem Relation Age of Onset  . Diabetes Mother   . Diabetes Father   . Heart disease Father 98    AMI x 2; first AMI age 96  . Heart disease Brother     defibrillator; CHF    Allergies  Allergen Reactions  . Amaryl [Glimepiride] Nausea Only  . Cephalexin Hives  . Sulfa Antibiotics Other (See Comments)    Unknown by pt     Medication list has been reviewed and updated.  Current Outpatient Prescriptions on File Prior to Visit  Medication Sig Dispense Refill  . aspirin 81 MG tablet Take 81 mg by mouth as needed.      [name] atorvastatin (LIPITOR) 20 MG tablet Take 1 tablet (20 mg total) by mouth daily.  90 tablet  3  . FLUoxetine (PROZAC) 20 MG capsule Take 1 capsule (20 mg total) by mouth daily.  90 capsule  3  . glipiZIDE (GLUCOTROL) 5 MG tablet TAKE 1 TABLET BY MOUTH EVERY NIGHT AT BEDTIME  30 tablet  3  . glucose blood (ONETOUCH VERIO) test strip Use as instructed to check blood sugars two times a day  100 each  5  . losartan (COZAAR) 25 MG tablet Take 1 tablet (25 mg total) by mouth daily.  90 tablet  3  . metFORMIN (GLUCOPHAGE) 1000 MG tablet TAKE 1 TABLET BY MOUTH TWO TIMES DAILY WITH A MEAL  60 tablet  3  . omeprazole (PRILOSEC) 20 MG capsule Take 1 capsule (20 mg total) by mouth daily.  90 capsule  3  . traZODone (DESYREL) 150 MG tablet Take 1 tablet (150 mg total) by mouth at bedtime.  90 tablet  3  . VICTOZA 18 MG/3ML SOPN INJECT 1.2 MG INTO THE SKIN DAILY  6 mL  3   No current facility-administered medications on file prior to visit.    Review of Systems:  As per HPI- otherwise negative.   Physical Examination: Filed Vitals:   05/30/14 0959  BP: 122/74  Pulse: 114  Temp: 98 F (36.7 C)  Resp: 18   Filed Vitals:   05/30/14 0959  Height: 5\' 5"  (1.651 m)  Weight: 156 lb 6.4 oz (70.943 kg)   Body mass index is 26.03 kg/(m^2). Ideal Body Weight: Weight in (lb) to have BMI = 25: 149.9  GEN: WDWN, NAD, Non-toxic, A & O x 3, looks well HEENT: Atraumatic, Normocephalic. Neck supple. No masses, No LAD. Ears and Nose: No external deformity. CV: RRR, No M/G/R. No JVD. No thrill. No extra heart sounds. PULM: CTA B, no wheezes, crackles, rhonchi. No retractions. No resp. distress. No accessory muscle use. ABD: S, NT, ND, +BS. No rebound. No HSM. She has minimal tenderness over the bilateral  lower abdomen.   EXTR: No c/c/e NEURO Normal gait.  PSYCH: Normally interactive. Conversant. Not depressed or anxious appearing.  Calm demeanor.  Pelvic: normal, no vaginal lesions or discharge. Uterus normal, no CMT, no adnexal tendereness or masses  Results for orders placed in visit on 05/30/14  POCT CBC      Result Value Ref Range   WBC 6.3  4.6 - 10.2 K/uL   Lymph, poc 1.8  0.6 - 3.4   POC LYMPH PERCENT 28.5  10 - 50 %L   MID (cbc) 0.5  0 - 0.9   POC MID % 7.5  0 - 12 %M   POC Granulocyte 4.0  2 - 6.9   Granulocyte percent 64.0  37 - 80 %G   RBC 4.85  4.04 - 5.48 M/uL   Hemoglobin 13.0  12.2 - 16.2 g/dL   HCT, POC 40.1  37.7 - 47.9 %   MCV 82.5  80 - 97 fL   MCH, POC 26.8 (*) 27 - 31.2 pg   MCHC 32.5  31.8 - 35.4 g/dL   RDW, POC 14.1     Platelet Count, POC 248  142 - 424 K/uL   MPV 5.8  0 - 99.8 fL  POCT UA - MICROSCOPIC ONLY      Result Value Ref Range   WBC, Ur, HPF, POC 0-1     RBC, urine, microscopic 0-3     Bacteria, U Microscopic neg     Mucus, UA neg     Epithelial cells, urine per micros 0-2     Crystals, Ur, HPF, POC neg     Casts, Ur, LPF, POC neg     Yeast, UA neg    POCT URINALYSIS DIPSTICK      Result Value Ref Range   Color, UA yellow     Clarity, UA cloudy     Glucose, UA neg     Bilirubin, UA neg     Ketones, UA neg     Spec Grav, UA 1.015     Blood, UA neg     pH, UA 7.0     Protein, UA neg     Urobilinogen, UA 0.2     Nitrite, UA neg     Leukocytes, UA Negative    POCT WET PREP WITH KOH      Result Value Ref Range   Trichomonas, UA Negative     Clue Cells Wet  Prep HPF POC neg     Epithelial Wet Prep HPF POC 0-4     Yeast Wet Prep HPF POC neg     Bacteria Wet Prep HPF POC neg     RBC Wet Prep HPF POC 0-4     WBC Wet Prep HPF POC 0-2     KOH Prep POC Negative      Assessment and Plan: Lower abdominal pain - Plan: POCT CBC, POCT UA - Microscopic Only, POCT urinalysis dipstick, Comprehensive metabolic panel, POCT Wet Prep with KOH,  Urine culture, US Pelvis Complete, US Transvaginal Non-OB, CANCELED: US OB Transvaginal  Discussed in detail with pt.   Her sx do not seem highly suspicious for appendicitis, especially with her normal wbc count.  Would consider an ultrasound vs CT for further evaluation.  She would prefer to have an ultrasound, which is a reasonable place to start.   Signed Lamar Blinks, MD  Called with ultrasound results:   TRANSABDOMINAL AND TRANSVAGINAL ULTRASOUND OF PELVIS  TECHNIQUE: Both transabdominal and transvaginal ultrasound examinations of the pelvis were performed. Transabdominal technique was performed for global imaging of the pelvis including uterus, ovaries, adnexal regions, and pelvic cul-de-sac. It was necessary to proceed with endovaginal exam following the transabdominal exam to visualize the uterus, ovaries, and adnexa.  COMPARISON: 05/21/2010 CT and 05/19/2010 pelvic ultrasound.  FINDINGS: Uterus: 6.7 x 2.5 x 4.0 cm. Normal in morphology.  Endometrium: Normal, 3 mm.  Right Ovary: 2.8 x 0.9 x 0.8 cm. Normal in morphology.  Left Ovary: Not visualized. No adnexal mass.  Other Findings: No significant free fluid.  IMPRESSION: 1. Lack of visualization of the left ovary. 2. Otherwise, normal pelvic ultrasound for age.  Called and discussed results with her.  Normal [place].  Offered to do a CT; she would prefer to see how she does over the next few days.   Will plan further follow- up pending labs. She will seek care right away if her sx worsen.   8/4: called and discussed with her.  She did not get her mychart messages as she does not actually have this set up/ cannot get into her account.  In any case, her labs look god except for 0-3 rbcs on her urine micro.  Explained that more than 2 cells per field is considered to be abnormal and requires evaluation.  However we do not know what her average number of cells was.  Also dip is negative for blood. Explained that evaluation  of unexplained microhematuria is recommended due to risk of cancer.  At this time she feels fine and does not wish to pursue further evaluation.  She is a never smoker.  Plan to repeat her ua and micro in October at her next visit.

## 2014-05-30 NOTE — Patient Instructions (Signed)
You have an appt for ultrasound at [facility] at 1:30; arrive at 1pm.  I will call with results

## 2014-05-31 LAB — COMPREHENSIVE METABOLIC PANEL
ALBUMIN: 4.3 g/dL (ref 3.5–5.2)
ALK PHOS: 73 U/L (ref 39–117)
ALT: 13 U/L (ref 0–35)
AST: 13 U/L (ref 0–37)
BUN: 16 mg/dL (ref 6–23)
CALCIUM: 9.1 mg/dL (ref 8.4–10.5)
CO2: 25 mEq/L (ref 19–32)
Chloride: 100 mEq/L (ref 96–112)
Creat: 1.03 mg/dL (ref 0.50–1.10)
Glucose, Bld: 111 mg/dL — ABNORMAL HIGH (ref 70–99)
POTASSIUM: 4.5 meq/L (ref 3.5–5.3)
Sodium: 140 mEq/L (ref 135–145)
Total Bilirubin: 0.3 mg/dL (ref 0.2–1.2)
Total Protein: 6.7 g/dL (ref 6.0–8.3)

## 2014-05-31 LAB — URINE CULTURE
Colony Count: NO GROWTH
Organism ID, Bacteria: NO GROWTH

## 2014-06-06 ENCOUNTER — Telehealth: Payer: Self-pay

## 2014-06-06 NOTE — Telephone Encounter (Signed)
Pt would like the lab results read to here from her visit on the 24th please! Pt is at work today: [phone]

## 2014-06-09 NOTE — Telephone Encounter (Signed)
Spoke to Dr. Lorelei Pont who would like to call pt back.

## 2014-06-09 NOTE — Telephone Encounter (Signed)
Called her but had to [facility].  Sorry she has not gotten her labs! I send them to her on mychart, but did want to discuss her mild microhematuria anyway.  Will try her again later on

## 2014-06-10 ENCOUNTER — Encounter: Payer: Self-pay | Admitting: Family Medicine

## 2014-06-23 ENCOUNTER — Other Ambulatory Visit: Payer: Self-pay | Admitting: Family Medicine

## 2014-07-21 ENCOUNTER — Other Ambulatory Visit (INDEPENDENT_AMBULATORY_CARE_PROVIDER_SITE_OTHER): Payer: Federal, State, Local not specified - PPO

## 2014-07-21 DIAGNOSIS — E1165 Type 2 diabetes mellitus with hyperglycemia: Principal | ICD-10-CM

## 2014-07-21 DIAGNOSIS — IMO0001 Reserved for inherently not codable concepts without codable children: Secondary | ICD-10-CM

## 2014-07-21 LAB — BASIC METABOLIC PANEL
BUN: 13 mg/dL (ref 6–23)
CO2: 30 meq/L (ref 19–32)
Calcium: 8.7 mg/dL (ref 8.4–10.5)
Chloride: 102 mEq/L (ref 96–112)
Creatinine, Ser: 1.1 mg/dL (ref 0.4–1.2)
GFR: 55.93 mL/min — AB (ref >60.00)
GLUCOSE: 121 mg/dL — AB (ref 70–99)
POTASSIUM: 3.8 meq/L (ref 3.5–5.1)
Sodium: 140 mEq/L (ref 135–145)

## 2014-07-21 LAB — HEMOGLOBIN A1C: Hgb A1c MFr Bld: 6.2 % (ref 4.6–6.5)

## 2014-07-24 ENCOUNTER — Encounter: Payer: Self-pay | Admitting: Endocrinology

## 2014-07-24 ENCOUNTER — Ambulatory Visit (INDEPENDENT_AMBULATORY_CARE_PROVIDER_SITE_OTHER): Payer: Federal, State, Local not specified - PPO | Admitting: Endocrinology

## 2014-07-24 VITALS — BP 138/80 | HR 91 | Temp 98.0°F | Resp 16 | Ht 65.0 in | Wt 156.8 lb

## 2014-07-24 DIAGNOSIS — E119 Type 2 diabetes mellitus without complications: Secondary | ICD-10-CM

## 2014-07-24 DIAGNOSIS — E785 Hyperlipidemia, unspecified: Secondary | ICD-10-CM

## 2014-07-24 NOTE — Progress Notes (Signed)
Patient ID: Abigail [name], female   DOB: October 15, 1951, 63 y.o.   MRN: [id number]   Reason for Appointment : Followup for Type 2 Diabetes  History of Present Illness          Diagnosis: Type 2 diabetes mellitus, date of diagnosis: 2004 ?        Past history: She initially had nonspecific symptoms of shakiness at diagnosis and had only mild hyperglycemia. Subsequently was started on metformin low doses for treatment The dose of metformin was gradually increased and she thinks she has been taking 1000 mg twice a day for at least a year. She did have nausea with increasing the dose; however this is better now although not resolved. She probably tried Byetta several years ago but she believes it did not help her control. She has been managed with metformin alone for the last year and a half but blood sugars have been progressively higher.  Blood sugars prior to her initial consultation were mostly over 200, up to 315 and her A1c was 10.3%  Recent history:   Her blood sugars had improved with adding Victoza in 9/14.  She is using 1.2 mg and has no GI side effects. However has not lost any more weight recently Her fasting readings did improve with adding glipizide at night and is only taking 2.5 mg now She has fairly stable blood sugars although somewhat higher at times after her evening meal She did have a couple of high readings last month when she forgot her medications on her trip Her A1c continues to be upper normal  Side effects from medications: She did have nausea with Amaryl  Glucose monitoring:  1.7 times a day        Glucometer:  One Touch Verio Blood Glucose readings from download recently:   PREMEAL Breakfast Lunch Dinner Bedtime Overall  Glucose range:  84-156   125    104-187    Mean/median: 114   151 128   Hypoglycemia: None      Self-care: The diet that the patient has been following GU:[id number] to limit fat intake, portions and sweets. Will have fruit for snacks.  Eating  out 2-3 times a week, rarely fast food    Exercise: Walking in the morning 30 minutes  3-4/7 days for several months.          Dietician visit: Most recent: Years ago. Has seen nurse educator.          Retinal exam:  annual She has had a weight range of 168-185 in the last few years  Wt Readings from Last 3 Encounters:  07/24/14 156 lb 12.8 oz (71.124 kg)  05/30/14 156 lb 6.4 oz (70.943 kg)  04/22/14 157 lb 6.4 oz (71.396 kg)  . LABS:  Lab Results  Component Value Date   HGBA1C 6.2 07/21/2014   HGBA1C 6.2 04/15/2014   HGBA1C 6.6* 01/14/2014   Lab Results  Component Value Date   MICROALBUR 3.4* 04/15/2014   LDLCALC 76 04/15/2014   CREATININE 1.1 07/21/2014       Medication List       This list is accurate as of: 07/24/14  8:04 AM.  Always use your most recent med list.               aspirin 81 MG tablet  Take 81 mg by mouth as needed.     atorvastatin 20 MG tablet  Commonly known as:  LIPITOR  Take 1 tablet (20 mg total) by  mouth daily.     FLUoxetine 20 MG capsule  Commonly known as:  PROZAC  Take 1 capsule (20 mg total) by mouth daily.     glipiZIDE 5 MG tablet  Commonly known as:  GLUCOTROL  TAKE 1 TABLET BY MOUTH EVERY NIGHT AT BEDTIME     glucose blood test strip  Commonly known as:  ONETOUCH VERIO  Use as instructed to check blood sugars two times a day     losartan 25 MG tablet  Commonly known as:  COZAAR  Take 1 tablet (25 mg total) by mouth daily.     metFORMIN 1000 MG tablet  Commonly known as:  GLUCOPHAGE  TAKE 1 TABLET BY MOUTH TWO TIMES DAILY WITH A MEAL     omeprazole 20 MG capsule  Commonly known as:  PRILOSEC  Take 1 capsule (20 mg total) by mouth daily.     traZODone 150 MG tablet  Commonly known as:  DESYREL  Take 1 tablet (150 mg total) by mouth at bedtime.     VICTOZA 18 MG/3ML Sopn  Generic drug:  Liraglutide  INJECT 1.2 MG INTO THE SKIN DAILY        Allergies:  Allergies  Allergen Reactions  . Amaryl [Glimepiride] Nausea  Only  . Cephalexin Hives  . Sulfa Antibiotics Other (See Comments)    Unknown by pt    Past Medical History  Diagnosis Date  . Diabetes mellitus   . Hypertension   . Hyperlipidemia   . Anxiety   . GERD (gastroesophageal reflux disease)     Past Surgical History  Procedure Laterality Date  . Ovarian cyst removal    . Hammer toe surgery      Family History  Problem Relation Age of Onset  . Diabetes Mother   . Diabetes Father   . Heart disease Father 36    AMI x 2; first AMI age 48  . Heart disease Brother     defibrillator; CHF    Social History:  reports that she has never smoked. She does not have any smokeless tobacco history on file. She reports that she drinks about .6 ounces of alcohol per week. She reports that she does not use illicit drugs.    Review of Systems       Lipids:  on treatment with Lipitor for years, LDL is  excellent    Lab Results  Component Value Date   CHOL 140 04/15/2014   HDL 50.50 04/15/2014   LDLCALC 76 04/15/2014   TRIG 70.0 04/15/2014   CHOLHDL 3 04/15/2014       The blood pressure has been treated with 25 mg Cozaar for "kidney protection". She appears to have significant white coat hypertension although blood pressure may be still relatively high         No history of Numbness, tingling or burning in her feet. May have a little numb feeling after sitting for long   LABS:  Appointment on 07/21/2014  Component Date Value Ref Range Status  . Hemoglobin A1C 07/21/2014 6.2  4.6 - 6.5 % Final   Glycemic Control Guidelines for People with Diabetes:Non Diabetic:  <6%Goal of Therapy: <7%Additional Action Suggested:  >8%   . Sodium 07/21/2014 140  135 - 145 mEq/L Final  . Potassium 07/21/2014 3.8  3.5 - 5.1 mEq/L Final  . Chloride 07/21/2014 102  96 - 112 mEq/L Final  . CO2 07/21/2014 30  19 - 32 mEq/L Final  . Glucose, Bld 07/21/2014 121* 70 -  99 mg/dL Final  . BUN 07/21/2014 13  6 - 23 mg/dL Final  . Creatinine, Ser 07/21/2014 1.1  0.4 - 1.2  mg/dL Final  . Calcium 07/21/2014 8.7  8.4 - 10.5 mg/dL Final  . GFR 07/21/2014 55.93* >60.00 mL/min Final    Physical Examination:  BP 164/93  Pulse 91  Temp(Src) 98 F (36.7 C)  Resp 16  Ht 5\' 5"  (1.651 m)  Wt 156 lb 12.8 oz (71.124 kg)  BMI 26.09 kg/m2  SpO2 94%    Repeat blood pressure 138/80  ASSESSMENT/PLAN:  Diabetes type 2, uncontrolled   Her blood sugars are continuing to be well controlled with her regimen of metformin, Victoza and 2.5 mg glipizide at bedtime Overall has been fairly compliant with diet and exercise also Has only sporadic high readings after supper based on her meals However A1c is continuing to be upper normal and she can continue the same regimen unchanged  ? Hypertension: She is only taking 25 mg losartan and blood pressure is relatively high and would consider using 50 mg This was deferred to PCP  [facility] 07/24/2014, 8:04 AM

## 2014-08-13 ENCOUNTER — Other Ambulatory Visit: Payer: Self-pay | Admitting: Endocrinology

## 2014-08-13 ENCOUNTER — Other Ambulatory Visit: Payer: Self-pay | Admitting: *Deleted

## 2014-08-25 ENCOUNTER — Encounter: Payer: Self-pay | Admitting: Family Medicine

## 2014-09-06 ENCOUNTER — Other Ambulatory Visit: Payer: Self-pay | Admitting: Endocrinology

## 2014-09-08 DIAGNOSIS — Z23 Encounter for immunization: Secondary | ICD-10-CM | POA: Diagnosis not present

## 2014-09-16 DIAGNOSIS — Z1231 Encounter for screening mammogram for malignant neoplasm of breast: Secondary | ICD-10-CM | POA: Diagnosis not present

## 2014-09-16 DIAGNOSIS — Z124 Encounter for screening for malignant neoplasm of cervix: Secondary | ICD-10-CM | POA: Diagnosis not present

## 2014-09-16 LAB — HM MAMMOGRAPHY

## 2014-10-03 ENCOUNTER — Other Ambulatory Visit: Payer: Self-pay | Admitting: Endocrinology

## 2014-10-06 ENCOUNTER — Encounter: Payer: Federal, State, Local not specified - PPO | Admitting: Family Medicine

## 2014-10-26 ENCOUNTER — Encounter: Payer: Self-pay | Admitting: Family Medicine

## 2014-10-27 ENCOUNTER — Encounter: Payer: Federal, State, Local not specified - PPO | Admitting: Family Medicine

## 2014-11-03 ENCOUNTER — Ambulatory Visit (INDEPENDENT_AMBULATORY_CARE_PROVIDER_SITE_OTHER): Payer: Medicare Other | Admitting: Family Medicine

## 2014-11-03 ENCOUNTER — Encounter: Payer: Self-pay | Admitting: Family Medicine

## 2014-11-03 VITALS — BP 143/81 | HR 93 | Temp 98.0°F | Resp 16 | Ht 65.5 in | Wt 152.0 lb

## 2014-11-03 DIAGNOSIS — F329 Major depressive disorder, single episode, unspecified: Secondary | ICD-10-CM

## 2014-11-03 DIAGNOSIS — Z23 Encounter for immunization: Secondary | ICD-10-CM | POA: Diagnosis not present

## 2014-11-03 DIAGNOSIS — F32A Depression, unspecified: Secondary | ICD-10-CM

## 2014-11-03 DIAGNOSIS — I10 Essential (primary) hypertension: Secondary | ICD-10-CM

## 2014-11-03 DIAGNOSIS — E785 Hyperlipidemia, unspecified: Secondary | ICD-10-CM | POA: Diagnosis not present

## 2014-11-03 DIAGNOSIS — E119 Type 2 diabetes mellitus without complications: Secondary | ICD-10-CM | POA: Diagnosis not present

## 2014-11-03 DIAGNOSIS — Z130 Encounter for screening for diseases of the blood and blood-forming organs and certain disorders involving the immune mechanism: Secondary | ICD-10-CM | POA: Diagnosis not present

## 2014-11-03 DIAGNOSIS — Z0000 Encounter for general adult medical examination without abnormal findings: Secondary | ICD-10-CM

## 2014-11-03 LAB — COMPREHENSIVE METABOLIC PANEL
ALT: 19 U/L (ref 0–35)
AST: 18 U/L (ref 0–37)
Albumin: 4.2 g/dL (ref 3.5–5.2)
Alkaline Phosphatase: 65 U/L (ref 39–117)
BUN: 16 mg/dL (ref 6–23)
CO2: 29 mEq/L (ref 19–32)
Calcium: 9.2 mg/dL (ref 8.4–10.5)
Chloride: 101 mEq/L (ref 96–112)
Creat: 1.03 mg/dL (ref 0.50–1.10)
Glucose, Bld: 85 mg/dL (ref 70–99)
Potassium: 4.7 mEq/L (ref 3.5–5.3)
Sodium: 138 mEq/L (ref 135–145)
Total Bilirubin: 0.3 mg/dL (ref 0.2–1.2)
Total Protein: 6.5 g/dL (ref 6.0–8.3)

## 2014-11-03 LAB — LIPID PANEL
Cholesterol: 157 mg/dL (ref 0–200)
HDL: 52 mg/dL (ref >39)
LDL Cholesterol: 72 mg/dL (ref 0–99)
Total CHOL/HDL Ratio: 3 Ratio
Triglycerides: 165 mg/dL — ABNORMAL HIGH (ref <150)
VLDL: 33 mg/dL (ref 0–40)

## 2014-11-03 MED ORDER — METFORMIN HCL 1000 MG PO TABS: ORAL_TABLET | ORAL | Status: DC

## 2014-11-03 MED ORDER — LIRAGLUTIDE 18 MG/3ML ~~LOC~~ SOPN: PEN_INJECTOR | SUBCUTANEOUS | Status: DC

## 2014-11-03 NOTE — Progress Notes (Signed)
[facility] [address] [place] [address] [phone]  Date:  11/03/2014   Name:  Abigail [name]   DOB:  1951/04/30   MRN:  [id number]  PCP:  Lamar Blinks, MD    Chief Complaint: Annual Exam   History of Present Illness:  Abigail [name] is a 63 y.o. very pleasant female patient who presents with the following:  Here today for a CPE.  She is fully retired- she would like to get a part time job but is enjoying retirement for the time being.  She enjoyed christmas with her 7 grandchildren.   Her pap and mammogram are UTD per Dr. Helane Rima.   DM looked good at our last visit.   She is on victoza and metformin, glipizide.  She is pleased with her weight loss on victoza.    She is fasting today for labs.   She is exercising some but plans to do more.    BP Readings from Last 3 Encounters:  11/03/14 143/81  07/24/14 138/80  05/30/14 122/74    Wt Readings from Last 3 Encounters:  11/03/14 152 lb (68.947 kg)  07/24/14 156 lb 12.8 oz (71.124 kg)  05/30/14 156 lb 6.4 oz (70.943 kg)    Appointment on 07/21/2014  Component Date Value Ref Range Status  . Hgb A1c MFr Bld 07/21/2014 6.2  4.6 - 6.5 % Final   Glycemic Control Guidelines for People with Diabetes:Non Diabetic:  <6%Goal of Therapy: <7%Additional Action Suggested:  >8%   . Sodium 07/21/2014 140  135 - 145 mEq/L Final  . Potassium 07/21/2014 3.8  3.5 - 5.1 mEq/L Final  . Chloride 07/21/2014 102  96 - 112 mEq/L Final  . CO2 07/21/2014 30  19 - 32 mEq/L Final  . Glucose, Bld 07/21/2014 121* 70 - 99 mg/dL Final  . BUN 07/21/2014 13  6 - 23 mg/dL Final  . Creatinine, Ser 07/21/2014 1.1  0.4 - 1.2 mg/dL Final  . Calcium 07/21/2014 8.7  8.4 - 10.5 mg/dL Final  . GFR 07/21/2014 55.93* >60.00 mL/min Final     Patient Active Problem List   Diagnosis Date Noted  . Other and unspecified hyperlipidemia 07/24/2013  . Chest pain 10/23/2012  . Type II or unspecified type diabetes mellitus  without mention of complication, uncontrolled 10/08/2012  . Anxiety 10/08/2012    Past Medical History  Diagnosis Date  . Diabetes mellitus   . Hypertension   . Hyperlipidemia   . Anxiety   . GERD (gastroesophageal reflux disease)     Past Surgical History  Procedure Laterality Date  . Ovarian cyst removal    . Hammer toe surgery      History  Substance Use Topics  . Smoking status: Never Smoker   . Smokeless tobacco: Not on file  . Alcohol Use: 0.6 oz/week    1 Glasses of wine per week    Family History  Problem Relation Age of Onset  . Diabetes Mother   . Diabetes Father   . Heart disease Father 73    AMI x 2; first AMI age 6  . Heart disease Brother     defibrillator; CHF    Allergies  Allergen Reactions  . Amaryl [Glimepiride] Nausea Only  . Cephalexin Hives  . Sulfa Antibiotics Other (See Comments)    Unknown by pt    Medication list has been reviewed and updated.  Current Outpatient Prescriptions on File Prior to Visit  Medication Sig Dispense Refill  .  aspirin 81 MG tablet Take 81 mg by mouth as needed.    [name] atorvastatin (LIPITOR) 20 MG tablet Take 1 tablet (20 mg total) by mouth daily. 90 tablet 3  . FLUoxetine (PROZAC) 20 MG capsule Take 1 capsule (20 mg total) by mouth daily. 90 capsule 3  . glipiZIDE (GLUCOTROL) 5 MG tablet TAKE 1 TABLET BY MOUTH EVERY NIGHT AT BEDTIME 30 tablet 3  . losartan (COZAAR) 25 MG tablet Take 1 tablet (25 mg total) by mouth daily. 90 tablet 3  . metFORMIN (GLUCOPHAGE) 1000 MG tablet TAKE 1 TABLET BY MOUTH TWICE DAILY WITH A MEAL 60 tablet 3  . omeprazole (PRILOSEC) 20 MG capsule Take 1 capsule (20 mg total) by mouth daily. 90 capsule 3  . ONETOUCH VERIO test strip TEST BLOOD SUGARS TWICE DAILY 100 each 3  . traZODone (DESYREL) 150 MG tablet Take 1 tablet (150 mg total) by mouth at bedtime. 90 tablet 3  . VICTOZA 18 MG/3ML SOPN INJECT 1.2 MG INTO THE SKIN DAILY 6 mL 3   No current facility-administered medications on  file prior to visit.    Review of Systems:  As per HPI- otherwise negative.   Physical Examination: Filed Vitals:   11/03/14 1028  BP: 143/81  Pulse: 93  Temp: 98 F (36.7 C)  Resp: 16   Filed Vitals:   11/03/14 1028  Height: 5' 5.5" (1.664 m)  Weight: 152 lb (68.947 kg)   Body mass index is 24.9 kg/(m^2). Ideal Body Weight: Weight in (lb) to have BMI = 25: 152.2  GEN: WDWN, NAD, Non-toxic, A & O x 3, looks well, normal weight HEENT: Atraumatic, Normocephalic. Neck supple. No masses, No LAD.  Bilateral TM wnl, oropharynx normal.  PEERL,EOMI.   Ears and Nose: No external deformity. CV: RRR, No M/G/R. No JVD. No thrill. No extra heart sounds. PULM: CTA B, no wheezes, crackles, rhonchi. No retractions. No resp. distress. No accessory muscle use. ABD: S, NT, ND. No rebound. No HSM. EXTR: No c/c/e NEURO Normal gait.  PSYCH: Normally interactive. Conversant. Not depressed or anxious appearing.  Calm demeanor.   She has noted some discomfort in her left mid- lower back for about one month off an on.  declines x-rays today but will let me know if it continues to bother her   Assessment and Plan: Physical exam  Depression  Hyperlipidemia - Plan: Comprehensive metabolic panel, Lipid panel  Diabetes mellitus type 2 in nonobese - Plan: Comprehensive metabolic panel, Hemoglobin A1c, Pneumococcal conjugate vaccine 13-valent IM, metFORMIN (GLUCOPHAGE) 1000 MG tablet, Liraglutide (VICTOZA) 18 MG/3ML SOPN  Essential hypertension  Screening for deficiency anemia  Labs pending as above.  She is doing well with her weight and BP, depression is well controlled.  Prevnar today, flu and tdap are UTD Will plan further follow- up pending labs.  She will let me know if her back continues to bother her- in that case will do x-rays See patient instructions for more details.     Signed Lamar Blinks, MD

## 2014-11-03 NOTE — Patient Instructions (Signed)
Good to see you today!  Let me know if your back pain is not better.   I refilled the metfomin and victoza- you have another 90 day fill on the other meds,  just have your pharmacy send me a request the next time you fill them  I will be in touch with your labs Assuming your A1c continues to look good let's plan to recheck in 4-6 months.  Keep exercising!  Your weight looks good and your BMI is NORMAL!!  As a diabetic, there are several things you can do to monitor your condition and maintain your health.  1. Check your feet daily for any skin breakdown 2. Exercise and keep track of your diet 3. Let us know before you run out of your medications 4. Get your annual flu shot, and ask if you need a pneumonia shot 5. Ask if you are up to date on your labs; you should have an A1c every 6 months, a urine protein test annually, and a cholesterol test annually.  Your doctor may decide to do labs more often if indicated 6. Take off your shoes and socks at each visit.  Be sure your doctor examines your feet.   7. Ask about your blood pressure.  Your goal is 130/ 80 or less 8. Get an annual eye exam.  Please ask your ophthalmologist to send [place] your report 9. Keep up with your dental cleanings and exams.

## 2014-11-04 LAB — HEMOGLOBIN A1C
Hgb A1c MFr Bld: 6.1 % — ABNORMAL HIGH (ref <5.7)
Mean Plasma Glucose: 128 mg/dL — ABNORMAL HIGH (ref <117)

## 2014-11-10 ENCOUNTER — Emergency Department (HOSPITAL_COMMUNITY): Payer: Medicare Other

## 2014-11-10 ENCOUNTER — Encounter: Payer: Self-pay | Admitting: Family Medicine

## 2014-11-10 ENCOUNTER — Encounter (HOSPITAL_COMMUNITY): Payer: Self-pay | Admitting: Emergency Medicine

## 2014-11-10 ENCOUNTER — Emergency Department (HOSPITAL_COMMUNITY)
Admission: EM | Admit: 2014-11-10 | Discharge: 2014-11-10 | Disposition: A | Discharge status: Home / Self Care | Payer: Medicare Other | Attending: Emergency Medicine | Admitting: Emergency Medicine

## 2014-11-10 DIAGNOSIS — T148 Other injury of unspecified body region: Secondary | ICD-10-CM | POA: Diagnosis not present

## 2014-11-10 DIAGNOSIS — Y9289 Other specified places as the place of occurrence of the external cause: Secondary | ICD-10-CM | POA: Diagnosis not present

## 2014-11-10 DIAGNOSIS — S82842A Displaced bimalleolar fracture of left lower leg, initial encounter for closed fracture: Secondary | ICD-10-CM | POA: Insufficient documentation

## 2014-11-10 DIAGNOSIS — Y9301 Activity, walking, marching and hiking: Secondary | ICD-10-CM | POA: Diagnosis not present

## 2014-11-10 DIAGNOSIS — E785 Hyperlipidemia, unspecified: Secondary | ICD-10-CM | POA: Insufficient documentation

## 2014-11-10 DIAGNOSIS — W108XXA Fall (on) (from) other stairs and steps, initial encounter: Secondary | ICD-10-CM | POA: Insufficient documentation

## 2014-11-10 DIAGNOSIS — I10 Essential (primary) hypertension: Secondary | ICD-10-CM | POA: Diagnosis not present

## 2014-11-10 DIAGNOSIS — Z7982 Long term (current) use of aspirin: Secondary | ICD-10-CM | POA: Insufficient documentation

## 2014-11-10 DIAGNOSIS — Z79899 Other long term (current) drug therapy: Secondary | ICD-10-CM | POA: Diagnosis not present

## 2014-11-10 DIAGNOSIS — M25579 Pain in unspecified ankle and joints of unspecified foot: Secondary | ICD-10-CM | POA: Diagnosis not present

## 2014-11-10 DIAGNOSIS — S82302A Unspecified fracture of lower end of left tibia, initial encounter for closed fracture: Secondary | ICD-10-CM | POA: Diagnosis not present

## 2014-11-10 DIAGNOSIS — E119 Type 2 diabetes mellitus without complications: Secondary | ICD-10-CM | POA: Insufficient documentation

## 2014-11-10 DIAGNOSIS — K219 Gastro-esophageal reflux disease without esophagitis: Secondary | ICD-10-CM | POA: Insufficient documentation

## 2014-11-10 DIAGNOSIS — S99912A Unspecified injury of left ankle, initial encounter: Secondary | ICD-10-CM | POA: Diagnosis present

## 2014-11-10 DIAGNOSIS — Y998 Other external cause status: Secondary | ICD-10-CM | POA: Diagnosis not present

## 2014-11-10 DIAGNOSIS — S82892A Other fracture of left lower leg, initial encounter for closed fracture: Secondary | ICD-10-CM | POA: Diagnosis not present

## 2014-11-10 DIAGNOSIS — S82832A Other fracture of upper and lower end of left fibula, initial encounter for closed fracture: Secondary | ICD-10-CM | POA: Diagnosis not present

## 2014-11-10 DIAGNOSIS — T1490XA Injury, unspecified, initial encounter: Secondary | ICD-10-CM

## 2014-11-10 DIAGNOSIS — F419 Anxiety disorder, unspecified: Secondary | ICD-10-CM | POA: Diagnosis not present

## 2014-11-10 DIAGNOSIS — W19XXXA Unspecified fall, initial encounter: Secondary | ICD-10-CM

## 2014-11-10 MED ORDER — PROPOFOL 10 MG/ML IV BOLUS
0.5000 mg/kg | Freq: Once | INTRAVENOUS | Status: AC
  Administered 2014-11-10: 30 mg via INTRAVENOUS
  Filled 2014-11-10: qty 1

## 2014-11-10 MED ORDER — FENTANYL CITRATE 0.05 MG/ML IJ SOLN
100.0000 ug | Freq: Once | INTRAMUSCULAR | Status: AC
Start: 2014-11-10 — End: 2014-11-10
  Administered 2014-11-10: 100 ug via INTRAVENOUS
  Filled 2014-11-10: qty 2

## 2014-11-10 MED ORDER — KETAMINE HCL 10 MG/ML IJ SOLN
0.5000 mg/kg | Freq: Once | INTRAMUSCULAR | Status: AC
  Administered 2014-11-10: 30 mg via INTRAVENOUS
  Filled 2014-11-10: qty 3.4

## 2014-11-10 MED ORDER — OXYCODONE-ACETAMINOPHEN 5-325 MG PO TABS: 2.0000 | ORAL_TABLET | ORAL | Status: DC | PRN

## 2014-11-10 MED ORDER — HYDROMORPHONE HCL 1 MG/ML IJ SOLN
0.5000 mg | Freq: Once | INTRAMUSCULAR | Status: AC
  Administered 2014-11-10: 0.5 mg via INTRAVENOUS
  Filled 2014-11-10: qty 1

## 2014-11-10 MED ORDER — ONDANSETRON HCL 4 MG/2ML IJ SOLN
4.0000 mg | Freq: Once | INTRAMUSCULAR | Status: AC
  Administered 2014-11-10: 4 mg via INTRAVENOUS
  Filled 2014-11-10: qty 2

## 2014-11-10 NOTE — ED Provider Notes (Signed)
CSN: [id number]     Arrival date & time 11/10/14  0800 History   First MD Initiated Contact with Patient 11/10/14 0801     Chief Complaint  Patient presents with  . Fall  . Ankle Deformity    HPI  Patient is a 64 year old female with past medical history diabetes, hypertension, hyperlipidemia who presents emergency room for evaluation of left ankle injury. Patient states that she was walking down steps when she missed a step and fell. She states that she did not hit her head and did not lose consciousness. She was unable to bear weight on her left leg after falling. She called EMS who noted an obvious deformity. Patient was given 200 g of fentanyl in route. Patient states that her pain has subsided after pain medication. Patient denies any prior injuries to this ankle.  Past Medical History  Diagnosis Date  . Diabetes mellitus   . Hypertension   . Hyperlipidemia   . Anxiety   . GERD (gastroesophageal reflux disease)    Past Surgical History  Procedure Laterality Date  . Ovarian cyst removal    . Hammer toe surgery     Family History  Problem Relation Age of Onset  . Diabetes Mother   . Diabetes Father   . Heart disease Father 55    AMI x 2; first AMI age 35  . Heart disease Brother     defibrillator; CHF   History  Substance Use Topics  . Smoking status: Never Smoker   . Smokeless tobacco: Not on file  . Alcohol Use: 0.6 oz/week    1 Glasses of wine per week   OB History    No data available     Review of Systems  Constitutional: Negative for fever, chills and fatigue.  Eyes: Negative for visual disturbance.  Respiratory: Negative for chest tightness and shortness of breath.   Cardiovascular: Negative for chest pain and palpitations.  Gastrointestinal: Negative for nausea and vomiting.  Musculoskeletal: Positive for joint swelling, arthralgias and gait problem.  Skin: Negative for color change and rash.  Neurological: Negative for dizziness, numbness and headaches.       Allergies  Amaryl; Cephalexin; and Sulfa antibiotics  Home Medications   Prior to Admission medications   Medication Sig Start Date End Date Taking? Authorizing Provider  aspirin 81 MG tablet Take 81 mg by mouth as needed (when patient remembers to take.).    Yes Historical Provider, MD  atorvastatin (LIPITOR) 20 MG tablet Take 1 tablet (20 mg total) by mouth daily. 02/06/14 02/06/15 Yes Wardell Honour, MD  FLUoxetine (PROZAC) 20 MG capsule Take 1 capsule (20 mg total) by mouth daily. 02/06/14  Yes Wardell Honour, MD  glipiZIDE (GLUCOTROL) 5 MG tablet TAKE 1 TABLET BY MOUTH EVERY NIGHT AT BEDTIME 10/06/14  Yes Elayne Snare, MD  Liraglutide (VICTOZA) 18 MG/3ML SOPN INJECT 1.2 MG INTO THE SKIN. Convert to 3 month supply with 4RF if allowed by insurance 11/03/14  Yes Gay Filler Copland, MD  losartan (COZAAR) 25 MG tablet Take 1 tablet (25 mg total) by mouth daily. 02/06/14  Yes Wardell Honour, MD  metFORMIN (GLUCOPHAGE) 1000 MG tablet TAKE 1 TABLET BY MOUTH TWICE DAILY WITH A MEAL 11/03/14  Yes Gay Filler Copland, MD  omeprazole (PRILOSEC) 20 MG capsule Take 1 capsule (20 mg total) by mouth daily. 02/06/14 02/06/15 Yes Wardell Honour, MD  ONETOUCH VERIO test strip TEST BLOOD SUGARS TWICE DAILY 09/08/14  Yes Elayne Snare, MD  traZODone (DESYREL) 150 MG tablet Take 1 tablet (150 mg total) by mouth at bedtime. 02/06/14  Yes Wardell Honour, MD  oxyCODONE-acetaminophen (PERCOCET) 5-325 MG per tablet Take 2 tablets by mouth every 4 (four) hours as needed. 11/10/14   Tarrence Enck A Forcucci, PA-C   BP 130/60 mmHg  Pulse 99  Temp(Src) 98.2 F (36.8 C) (Oral)  Resp 16  Ht 5\' 5"  (1.651 m)  Wt 152 lb (68.947 kg)  BMI 25.29 kg/m2  SpO2 96% Physical Exam  Constitutional: She is oriented to person, place, and time. She appears well-developed and well-nourished. No distress.  HENT:  Head: Normocephalic and atraumatic.  Mouth/Throat: Oropharynx is clear and moist. No oropharyngeal exudate.  Eyes: Conjunctivae and EOM  are normal. Pupils are equal, round, and reactive to light. No scleral icterus.  Neck: Normal range of motion. Neck supple. No JVD present. No thyromegaly present.  Cardiovascular: Normal rate, regular rhythm, normal heart sounds and intact distal pulses.  Exam reveals no gallop and no friction rub.   No murmur heard. Pulses:      Dorsalis pedis pulses are 2+ on the left side.       Posterior tibial pulses are 2+ on the left side.  Pulmonary/Chest: Effort normal and breath sounds normal. No respiratory distress. She has no wheezes. She has no rales. She exhibits no tenderness.  Musculoskeletal:       Left ankle: She exhibits decreased range of motion, swelling and deformity. She exhibits no ecchymosis, no laceration and normal pulse. Tenderness. Lateral malleolus, medial malleolus and AITFL tenderness found. No CF ligament, no posterior TFL, no head of 5th metatarsal and no proximal fibula tenderness found.  Normal sensation to light touch over the left foot and ankle.  Lymphadenopathy:    She has no cervical adenopathy.  Neurological: She is alert and oriented to person, place, and time. She has normal strength. No cranial nerve deficit or sensory deficit.  Skin: Skin is warm and dry. She is not diaphoretic.  Psychiatric: She has a normal mood and affect. Her behavior is normal. Judgment and thought content normal.  Nursing note and vitals reviewed.   ED Course  Reduction of dislocation Date/Time: 11/10/2014 11:21 AM Performed by: Starlyn Skeans A Authorized by: Starlyn Skeans A Consent: Verbal consent obtained. Written consent obtained. Risks and benefits: risks, benefits and alternatives were discussed Consent given by: patient Patient understanding: patient states understanding of the procedure being performed Patient consent: the patient's understanding of the procedure matches consent given Procedure consent: procedure consent matches procedure scheduled Relevant documents:  relevant documents present and verified Test results: test results available and properly labeled Site marked: the operative site was marked Imaging studies: imaging studies available Patient identity confirmed: verbally with patient Time out: Immediately prior to procedure a "time out" was called to verify the correct patient, procedure, equipment, support staff and site/side marked as required. Patient sedated: yes Sedation type: moderate (conscious) sedation Sedatives: propofol and ketamine Analgesia: fentanyl Sedation start date/time: 11/10/2014 9:42 AM Sedation end date/time: 11/10/2014 10:05 AM Patient tolerance: Patient tolerated the procedure well with no immediate complications Comments: Ankle reduced. Postreduction films ordered.   (including critical care time) Labs Review Labs Reviewed - No data to display  Imaging Review Dg Tibia/fibula Left  11/10/2014   CLINICAL DATA:  Left lower extremity deformity after fall down steps today.  EXAM: LEFT TIBIA AND FIBULA - 2 VIEW  COMPARISON:  None.  FINDINGS: Severely angulated and comminuted fracture is seen involving the  distal fibula. This appears to be closed and posttraumatic. There is noted posterior dislocation of the talus relative to the distal tibia. No soft tissue abnormality or radiopaque foreign body is noted.  IMPRESSION: Severely angulated and comminuted distal fibular fracture. This is the initial encounter.   Electronically Signed   By: Sabino Dick M.D.   On: 11/10/2014 08:37   Dg Ankle Complete Left  11/10/2014   CLINICAL DATA:  Ankle fractures.  Postreduction images.  EXAM:  LEFT ANKLE COMPLETE - 3+ VIEW  COMPARISON:  Plain films of the left ankle earlier this same day.  FINDINGS: The patient has now in a fiberglass splint. The tibiotalar joint has been reduced. Distal fibular and posterior malleolar fractures are again seen and demonstrate improved position and alignment.  IMPRESSION: Reduction of the tibiotalar joint with  improved position alignment patient's fractures as above.   Electronically Signed   By: Inge Rise M.D.   On: 11/10/2014 10:38   Dg Ankle Complete Left  11/10/2014   CLINICAL DATA:  Status post fall down steps earlier this morning with obvious deformity  EXAM: LEFT ANKLE COMPLETE - 3+ VIEW  COMPARISON:  . Left tibia and fibula of today's date and left foot series of Mar 29, 2012  FINDINGS: The patient has sustained an acute fracture of the metadiaphysis of the distal fibula. There is angulation and comminution. There is a angulated and displaced posterior malleolar fracture. There is disruption of the ankle joint mortise. A small avulsion from the tip of the medial malleolus is suspected. The talar dome is intact. The calcaneus and other tarsal bones are unremarkable. The metatarsal bases appear intact.  IMPRESSION: The patient has sustained a bimalleolar/trimalleolar fracture. There is comminution and angulation of the distal fibular fracture. There is angulation and displacement of the posterior malleolar fracture. The medial malleolar fracture consists of a small avulsion.   Electronically Signed   By: David  Martinique   On: 11/10/2014 08:37   Ct Ankle Left Wo Contrast  11/10/2014   CLINICAL DATA:  Fracture dislocation at the left ankle.  EXAM: CT OF THE LEFT ANKLE WITHOUT CONTRAST  TECHNIQUE: Multidetector CT imaging of the left ankle was performed according to the standard protocol. Multiplanar CT image reconstructions were also generated.  COMPARISON:  Radiographs dated 11/10/2014  FINDINGS: There is a comminuted fracture of the distal tibia involving the posterior and medial malleolar I with coronal and oblique coronal fracture planes. There is posterior subluxation of the foot with respect to the anterior major component of the distal tibia best seen in the sagittal plane. The posterior malleolar fracture involves approximately 1/3 of the articular surface of the distal tibia. Maximum distraction of  the fracture is 7 mm at the lateral aspect of the fracture.  There is disruption of the anterior tibiofibular ligament with small avulsions of bone at that site.  There is a comminuted fracture of the distal shaft of the fibula centered 5.5 cm above the tip of the lateral malleolus with minimal displacement and slight angulation. There is a tiny avulsion from the anterior lateral aspect of the tip of the fibula. I cannot fully identified the anterior talofibular ligament.  Coronal reconstructed images demonstrate widening of the lateral aspect of the tibiotalar joint. The talus is intact. Benign bone island of the medial aspect of the dome of the talus.  There is no tendon entrapment.  IMPRESSION: Comminuted fractures of the distal tibia and fibula as described. Posterior subluxation of the foot with  respect to the major distal tibia component.   Electronically Signed   By: Rozetta Nunnery M.D.   On: 11/10/2014 10:55     EKG Interpretation None      MDM   Final diagnoses:  Injury  Bimalleolar fracture, left, closed, initial encounter  Fall, initial encounter   Patient is a 64 year old female who presents emergency room for evaluation of left ankle injury after a fall. Physical exam reveals no focal neurological deficits. The left leg is neurovascularly intact. There is obvious deformities on exam. Plain film x-ray reveals a severely angulated fibular fracture with translation of the tibia and ankle dislocation. Patient consciously sedated and the ankle was reduced as seen above. Postreduction films reveal adequate reduction. I spoken with Dr.Xu who feels that the patient may be seen in office. He suggests an appointment tomorrow. Patient to be nonweightbearing on crutches. We'll discharge home with Percocet. Patient to return for symptoms of compartment syndrome which we discussed. Patient is stable for discharge at this time. Patient was seen by and discussed with Dr. Wilson Singer who agrees with the above  workup and plan.   Cherylann Parr, PA-C 11/10/14 1128  Virgel Manifold, MD 11/11/14 [phone]

## 2014-11-10 NOTE — ED Notes (Addendum)
2 lpm [facility] placed prior to medication administration for moderate sedation.

## 2014-11-10 NOTE — ED Notes (Signed)
Per EMS fall down three stairs resulting in left ankle deformity. No LOC. Pt given 200 mcg fentanyl en route with EMS.

## 2014-11-10 NOTE — ED Notes (Addendum)
Pt transported to xray. Pt 2 lpm  removed.

## 2014-11-10 NOTE — Discharge Instructions (Signed)
Ankle Fracture A fracture is a break in a bone. A cast or splint may be used to protect the ankle and heal the break. Sometimes, surgery is needed. HOME CARE  Use crutches as told by your doctor. It is very important that you use your crutches correctly.  Do not put weight or pressure on the injured ankle until told by your doctor.  Keep your ankle raised (elevated) when sitting or lying down.  Apply ice to the ankle:  Put ice in a plastic bag.  Place a towel between your cast and the bag.  Leave the ice on for 20 minutes, 2-3 times a day.  If you have a plaster or fiberglass cast:  Do not try to scratch under the cast with any objects.  Check the skin around the cast every day. You may put lotion on red or sore areas.  Keep your cast dry and clean.  If you have a plaster splint:  Wear the splint as told by your doctor.  You can loosen the elastic around the splint if your toes get numb, tingle, or turn cold or blue.  Do not put pressure on any part of your cast or splint. It may break. Rest your plaster splint or cast only on a pillow the first 24 hours until it is fully hardened.  Cover your cast or splint with a plastic bag during showers.  Do not lower your cast or splint into water.  Take medicine as told by your doctor.  Do not drive until your doctor says it is safe.  Follow-up with your doctor as told. It is very important that you go to your follow-up visits. GET HELP IF: The swelling and discomfort gets worse.  GET HELP RIGHT AWAY IF:   Your splint or cast breaks.  You continue to have very bad pain.  You have new pain or swelling after your splint or cast was put on.  Your skin or toes below the injured ankle:  Turn blue or gray.  Feel cold, numb, or you cannot feel them.  There is a bad smell or yellowish white fluid (pus) coming from under the splint or cast. MAKE SURE YOU:   Understand these instructions.  Will watch your  condition.  Will get help right away if you are not doing well or get worse. Document Released: 08/21/2009 Document Revised: 08/14/2013 Document Reviewed: 05/23/2013 [facility] Patient Information 2015 [place], [place]. This information is not intended to replace advice given to you by your health care provider. Make sure you discuss any questions you have with your health care provider.   Emergency Department Resource Guide 1) Find a Doctor and Pay Out of Pocket Although you won't have to find out who is covered by your insurance plan, it is a good idea to ask around and get recommendations. You will then need to call the office and see if the doctor you have chosen will accept you as a new patient and what types of options they offer for patients who are self-pay. Some doctors offer discounts or will set up payment plans for their patients who do not have insurance, but you will need to ask so you aren't surprised when you get to your appointment.  2) Contact Your Local Health Department Not all health departments have doctors that can see patients for sick visits, but many do, so it is worth a call to see if yours does. If you don't know where your local health department is, you  can check in your phone book. The CDC also has a tool to help you locate your state's health department, and many state websites also have listings of all of their local health departments.  3) Find a [facility] If your illness is not likely to be very severe or complicated, you may want to try a walk in clinic. These are popping up all over the country in pharmacies, drugstores, and shopping centers. They're usually staffed by nurse practitioners or physician assistants that have been trained to treat common illnesses and complaints. They're usually fairly quick and inexpensive. However, if you have serious medical issues or chronic medical problems, these are probably not your best option.  No Primary Care  Doctor: - Call Health Connect at  [phone] - they can help you locate a primary care doctor that  accepts your insurance, provides certain services, etc. - Physician Referral Service- [phone]  Chronic Pain Problems: Organization         Address  Phone   Notes  [facility]  [phone] Patients need to be referred by their primary care doctor.   Medication Assistance: Organization         Address  Phone   Notes  [facility] Medication [facility] [address] [place], [place] [address] [phone] --Must be a resident of [facility] -- Must have NO insurance coverage whatsoever (no Medicaid/ Medicare, etc.) -- The pt. MUST have a primary care doctor that directs their care regularly and follows them in the community   MedAssist  [phone]   [employer]  [phone]    Agencies that provide inexpensive medical care: Organization         Address  Phone   Notes  [place]  [phone]   [facility]    [phone]   [facility] [place], [place] [address] [phone]   [place] [address], [place] [phone]   Planned Parenthood    [phone]   [facility]    [phone]   [place] and [place] [address], [place] Phone:  [phone], Fax:  [phone] Hours of Operation:  9 am - 6 pm, M-F.  Also accepts Medicaid/Medicare and self-pay.  [facility] [place], [address], [place] Phone: [phone], Fax: [phone]. Hours of Operation:  8:30 am - 5:30 pm, M-F.  Also accepts Medicaid and self-pay.  [facility] [address] [place] Phone: [phone]   [place], [place], [place] [phone], Ext. [phone] Mondays & Thursdays: 7-9 AM.  First 15 patients are seen on a first  come, first serve basis.    [place] Providers:  Organization         Address  Phone   Notes  [facility] [address], [place] [phone] Also accepts self-pay patients.  [facility] [address], [place]  [phone]   [place], [address], [place] [phone]   [facility] [address], [place] [phone]   Lucianne Lei [address], [place]   [phone] Only accepts [place] Access [place] patients after they have their name applied  to their card.   Self-Pay (no insurance) in [facility]:  Organization         Address  Phone   Notes  Sickle Cell Patients, [facility] [place] [phone]   [facility] [place] [phone]   [facility] [place]  [place], [place], [place] [phone]   [facility]/Dr. Osei-Bonsu  [address] [place] [address], [place] [phone] Phone number for both [place] and [place] locations is the same.  [facility] [address] [place] [phone]   [facility] [address] [place] or [address] [phone]   [facility] [address] [place] [phone], phone; [phone], fax Sees patients 1st and 3rd Saturday of every month.  Must not qualify for public or private insurance (i.e. Medicaid, Medicare, [place] Health Choice, Veterans' Benefits)  Household income should be no more than 200% of the poverty level The clinic cannot treat you if you are pregnant or think you are pregnant  Sexually transmitted diseases are not treated at the clinic.    Dental Care: Organization          Address  Phone  Notes  [facility] Department of [facility] [place] [phone] Accepts children up to age 7 who are enrolled in [place] or [place]; pregnant women with a Medicaid card; and children who have applied for Medicaid or [place] Health Choice, but were declined, whose parents can pay a reduced fee at time of service.  [facility] Department of [facility]  [address], [place] [phone] Accepts children up to age 19 who are enrolled in [place] or [place]; pregnant women with a Medicaid card; and children who have applied for Medicaid or [place] Health Choice, but were declined, whose parents can pay a reduced fee at time of service.  [place] Adult Dental Access PROGRAM  [place] [phone] Patients are seen by appointment only. Walk-ins are not accepted. [place] will see patients 19 years of age and older. Monday - Tuesday (8am-5pm) Most Wednesdays (8:30-5pm) $30 per visit, cash only  [facility] Adult Dental Access PROGRAM  [address], [facility] [phone] Patients are seen by appointment only. Walk-ins are not accepted. [place] will see patients 36 years of age and older. One Wednesday Evening (Monthly: Volunteer Based).  $30 per visit, cash only  [place]  [phone] for adults; Children under age 32, call Graduate Pediatric Dentistry at [phone]. Children aged 17-14, please call [phone] to request a pediatric application.  Dental services are provided in all areas of dental care including fillings, crowns and bridges, complete and partial dentures, implants, gum treatment, root canals, and extractions. Preventive care is also provided. Treatment is provided to both adults and children. Patients are selected via a lottery and there is often a waiting list.   [facility] [address], [place]  [phone] [email or web address]   [facility] [address] [place], [place] [phone], Ext. [phone] Second and Fourth Thursday of each month, opens at 6:30 AM; Clinic ends at 9 AM.  Patients are seen on a first-come first-served  basis, and a limited number are seen during each clinic.   [facility]  [address] Hillard Danker [place], [place] [phone]   Eligibility Requirements You must have lived in [place], [place], or [place] counties for at least the last three months.   You cannot be eligible for state or federal sponsored [employer], including [employer], [place], or [employer].   You generally cannot be eligible for healthcare insurance through your employer.    How to apply: Eligibility screenings are held every Tuesday and Wednesday afternoon from 1:00 pm until 4:00 pm. You do not need an appointment for the interview!  [facility] [address], [place], [place]   [place]  [place] Department  [place]  [phone]    Behavioral Health Resources in the Community: Intensive Outpatient Programs Organization         Address  Phone  Notes  [place]. [address] [place], [place] [phone]   [facility]patient [address] [place], [place]   ADS: Alcohol & Drug Svcs [address] [place], [place]   [place] [address]  [place], [place] or [phone]   Substance Abuse Resources Organization         Address  Phone  Notes  Alcohol and Drug Services  [phone]   [place]  [phone]   The [place]   Chinita Pester  [phone]   Residential & Outpatient Substance Abuse Program  [phone]   Psychological  Services Organization         Address  Phone  Notes  [facility] [place]  [place]  [phone]   [place] [address] [place] or [phone]    Mobile Crisis Teams Organization         Address  Phone  Notes  [facility], Mobile Crisis Care Unit  [phone]   [facility]  [address] [place], [place]   [place] [address] [place] [phone]    Self-Help/Support Groups Organization         Address  Phone             Notes  [place]. of [place] - variety of support groups  [place] Call for more information  Narcotics Anonymous (NA), [facility] [address], [employer] [place]  2 meetings at this location   Special educational needs teacher         Address  Phone  Notes  [facility]idential Treatment [place],    [place]  [phone]   [facility]  [address] [place] [address], [place], [place]   [place], [place] [phone] Admissions: 8am-3pm M-F  Incentives Substance [place] [address],    [place], [place] [phone]   [facility] [address] Jadene Pierini [place], [place]   [facility] [address]  [place], [place]   [facility] - Intensive Outpatient [address], [place], [place]   [facility] [place].,  [place], [place] or [phone]   [facility] Services (RTS) [address] [place], [place] Accepts Medicaid  [facility]ship [address] [place] [address],  [place] [phone] Substance Abuse/Addiction Treatment   [facility] Organization         Address  Phone  Notes  [facility]  [phone]   Domenic Schwab, PhD [address] [place], [place]   [phone] or [phone]   [place], [place] [phone]   [address], [place], [place] [phone] Insurance/Medicaid/sponsorship through [facility] and Families [address]                                    [place], [place] [phone] [place] [address], [place] [phone]    Dr. Adele Schilder  [phone]   [facility] Dept. 1) [address], [place] 2) [place] 3)  [address], [place] [phone]  [phone]   [place] [phone] or [phone] (After Hours)

## 2014-11-10 NOTE — ED Notes (Signed)
Bed: TX52 Expected date:  Expected time:  Means of arrival:  Comments: EMS- 64yo F, fall, ankle deformity

## 2014-11-10 NOTE — ED Provider Notes (Addendum)
Medical screening examination/treatment/procedure(s) were conducted as a shared visit with non-physician practitioner(s) and myself.  I personally evaluated the patient during the encounter.   EKG Interpretation None     63yF with closed fx/dislocation L ankle. NVI intact. Plan closed reduction. Splint. Ortho FU.   Procedural Sedation:  Preprocedure  Pre-anesthesia/induction confirmation of laterality/correct procedure site including "time-out."  Provider confirms review of the nurses' note, allergies, medications, pertinent labs, PMH, pre-induction vital signs, pulse oximetry, pain level, and ECG (as applicable), and patient condition satisfactory for commencing with order for sedation and procedure.  Medications:  Ketamine IV: 30 mg Propofol IV: 30 mg  Patient tolerated procedure and procedural sedation component as expected without apparent immediate complications.  Physician confirms procedural medication orders as administered, patient was assessed by physician post-procedure, and confirms post-sedation plan of care and disposition.  Total time of sedation/monitoring: 30 minutes   Reduction of Fracture/Dislocation Date/Time: 11/10/2014 9:40 AM Performed by: Virgel Manifold Authorized by: Virgel Manifold Consent: Verbal consent obtained. Written Cconsent obtained.  Risks and benefits: risks, benefits and alternatives were discussed Consent given by: patient Required items: required blood products, implants, devices, and special equipment available Patient identity confirmed: verbally with patient and provided demographic data Local anesthesia used: no Patient sedated: yes Sedation type: moderate (conscious) sedation Sedatives: see MAR for details Vitals: Vital signs were monitored during sedation. Patient tolerance: Patient tolerated the procedure well with no immediate complications    Virgel Manifold, MD 11/12/14 [phone]

## 2014-11-10 NOTE — ED Notes (Signed)
Guaze and tape applied to IV site removed.

## 2014-11-10 NOTE — ED Notes (Addendum)
Remaining 17 ml of propofol wasted with A Nease CN. Remaining ketamine walked down to pharmacy by A Nease CN.

## 2014-11-11 DIAGNOSIS — S82852A Displaced trimalleolar fracture of left lower leg, initial encounter for closed fracture: Secondary | ICD-10-CM | POA: Diagnosis not present

## 2014-11-12 ENCOUNTER — Other Ambulatory Visit (HOSPITAL_COMMUNITY): Payer: Self-pay | Admitting: Orthopaedic Surgery

## 2014-11-14 ENCOUNTER — Other Ambulatory Visit: Payer: Self-pay | Admitting: Orthopaedic Surgery

## 2014-11-14 ENCOUNTER — Encounter (HOSPITAL_COMMUNITY): Payer: Self-pay | Admitting: *Deleted

## 2014-11-14 NOTE — Progress Notes (Signed)
Pt denies cardiac history. Denies chest pain or sob.  

## 2014-11-16 MED ORDER — CLINDAMYCIN PHOSPHATE 900 MG/50ML IV SOLN
900.0000 mg | INTRAVENOUS | Status: AC
  Administered 2014-11-17: 900 mg via INTRAVENOUS
  Filled 2014-11-16: qty 50

## 2014-11-16 NOTE — H&P (Signed)
PREOPERATIVE H&P  Chief Complaint: Left trimalleolar ankle fracture  HPI: Abigail [name] is a 64 y.o. female who presents for surgical treatment of Left trimalleolar ankle fracture.  She denies any changes in medical history.  Past Medical History  Diagnosis Date  . Hypertension   . Hyperlipidemia   . Anxiety   . GERD (gastroesophageal reflux disease)   . Diabetes mellitus     Type 2  . Arthritis    Past Surgical History  Procedure Laterality Date  . Ovarian cyst removal    . Hammer toe surgery    . Colonoscopy     History   Social History  . Marital Status: Married    Spouse Name: N/A    Number of Children: N/A  . Years of Education: N/A   Social History Main Topics  . Smoking status: Never Smoker   . Smokeless tobacco: Never Used  . Alcohol Use: 0.6 oz/week    1 Glasses of wine per week  . Drug Use: No  . Sexual Activity: Yes    Birth Control/ Protection: None   Other Topics Concern  . None   Social History Narrative   Marital status: married x 1970.      Children:  2 children; 7 grandchildren.      Employment:  [employer].      Tobacco: none       Alcohol: sporadic       Exercises: walking 3 days per week.   Family History  Problem Relation Age of Onset  . Diabetes Mother   . Diabetes Father   . Heart disease Father 24    AMI x 2; first AMI age 49  . Heart disease Brother     defibrillator; CHF   Allergies  Allergen Reactions  . Amaryl [Glimepiride] Nausea Only  . Cephalexin Hives  . Sulfa Antibiotics Other (See Comments)    Unknown by pt   Prior to Admission medications   Medication Sig Start Date End Date Taking? Authorizing Provider  aspirin 81 MG tablet Take 81 mg by mouth as needed (when patient remembers to take.).     Historical Provider, MD  atorvastatin (LIPITOR) 20 MG tablet Take 1 tablet (20 mg total) by mouth daily. 02/06/14 02/06/15  Wardell Honour, MD  FLUoxetine (PROZAC) 20 MG capsule Take 1 capsule (20 mg  total) by mouth daily. 02/06/14   Wardell Honour, MD  glipiZIDE (GLUCOTROL) 5 MG tablet TAKE 1 TABLET BY MOUTH EVERY NIGHT AT BEDTIME 10/06/14   Elayne Snare, MD  Liraglutide (VICTOZA) 18 MG/3ML SOPN INJECT 1.2 MG INTO THE SKIN. Convert to 3 month supply with 4RF if allowed by insurance 11/03/14   Darreld Mclean, MD  losartan (COZAAR) 25 MG tablet Take 1 tablet (25 mg total) by mouth daily. 02/06/14   Wardell Honour, MD  metFORMIN (GLUCOPHAGE) 1000 MG tablet TAKE 1 TABLET BY MOUTH TWICE DAILY WITH A MEAL 11/03/14   Gay Filler Copland, MD  omeprazole (PRILOSEC) 20 MG capsule Take 1 capsule (20 mg total) by mouth daily. 02/06/14 02/06/15  Wardell Honour, MD  ONETOUCH VERIO test strip TEST BLOOD SUGARS TWICE DAILY 09/08/14   Elayne Snare, MD  oxyCODONE-acetaminophen (PERCOCET) 5-325 MG per tablet Take 2 tablets by mouth every 4 (four) hours as needed. 11/10/14   Courtney A Forcucci, PA-C  traZODone (DESYREL) 150 MG tablet Take 1 tablet (150 mg total) by mouth at bedtime. 02/06/14   Wardell Honour, MD  Positive ROS: All other systems have been reviewed and were otherwise negative with the exception of those mentioned in the HPI and as above.  Physical Exam: General: Alert, no acute distress Cardiovascular: No pedal edema Respiratory: No cyanosis, no use of accessory musculature GI: abdomen soft Skin: No lesions in the area of chief complaint Neurologic: Sensation intact distally Psychiatric: Patient is competent for consent with normal mood and affect Lymphatic: no lymphedema  MUSCULOSKELETAL:  - exam stable  Assessment: Left trimalleolar ankle fracture  Plan: Plan for Procedure(s): OPEN REDUCTION INTERNAL FIXATION (ORIF) LEFT TRIMALLEOLAR ANKLE FRACTURE  The risks benefits and alternatives were discussed with the patient including but not limited to the risks of nonoperative treatment, versus surgical intervention including infection, bleeding, nerve injury,  blood clots, cardiopulmonary  complications, morbidity, mortality, among others, and they were willing to proceed.   Marianna Payment, MD   11/16/2014 2:59 PM

## 2014-11-17 ENCOUNTER — Ambulatory Visit (HOSPITAL_COMMUNITY): Payer: Medicare Other | Admitting: Certified Registered Nurse Anesthetist

## 2014-11-17 ENCOUNTER — Encounter (HOSPITAL_COMMUNITY): Payer: Self-pay | Admitting: *Deleted

## 2014-11-17 ENCOUNTER — Ambulatory Visit (HOSPITAL_COMMUNITY): Payer: Medicare Other

## 2014-11-17 ENCOUNTER — Encounter (HOSPITAL_COMMUNITY)
Admission: RE | Disposition: A | Discharge status: Home / Self Care | Payer: Self-pay | Source: Ambulatory Visit | Attending: Orthopaedic Surgery

## 2014-11-17 ENCOUNTER — Observation Stay (HOSPITAL_COMMUNITY)
Admission: RE | Admit: 2014-11-17 | Discharge: 2014-11-18 | Disposition: A | Discharge status: Home / Self Care | Payer: Medicare Other | Source: Ambulatory Visit | Attending: Orthopaedic Surgery | Admitting: Orthopaedic Surgery

## 2014-11-17 DIAGNOSIS — S82852A Displaced trimalleolar fracture of left lower leg, initial encounter for closed fracture: Principal | ICD-10-CM | POA: Insufficient documentation

## 2014-11-17 DIAGNOSIS — S82852B Displaced trimalleolar fracture of left lower leg, initial encounter for open fracture type I or II: Secondary | ICD-10-CM | POA: Diagnosis not present

## 2014-11-17 DIAGNOSIS — Y929 Unspecified place or not applicable: Secondary | ICD-10-CM | POA: Insufficient documentation

## 2014-11-17 DIAGNOSIS — X58XXXA Exposure to other specified factors, initial encounter: Secondary | ICD-10-CM | POA: Diagnosis not present

## 2014-11-17 DIAGNOSIS — M25572 Pain in left ankle and joints of left foot: Secondary | ICD-10-CM | POA: Diagnosis not present

## 2014-11-17 DIAGNOSIS — W19XXXA Unspecified fall, initial encounter: Secondary | ICD-10-CM | POA: Diagnosis not present

## 2014-11-17 DIAGNOSIS — S82852D Displaced trimalleolar fracture of left lower leg, subsequent encounter for closed fracture with routine healing: Secondary | ICD-10-CM | POA: Diagnosis not present

## 2014-11-17 DIAGNOSIS — F419 Anxiety disorder, unspecified: Secondary | ICD-10-CM | POA: Diagnosis not present

## 2014-11-17 DIAGNOSIS — I10 Essential (primary) hypertension: Secondary | ICD-10-CM | POA: Insufficient documentation

## 2014-11-17 DIAGNOSIS — S82853A Displaced trimalleolar fracture of unspecified lower leg, initial encounter for closed fracture: Secondary | ICD-10-CM | POA: Diagnosis present

## 2014-11-17 DIAGNOSIS — G8918 Other acute postprocedural pain: Secondary | ICD-10-CM | POA: Diagnosis not present

## 2014-11-17 DIAGNOSIS — Z419 Encounter for procedure for purposes other than remedying health state, unspecified: Secondary | ICD-10-CM

## 2014-11-17 DIAGNOSIS — E118 Type 2 diabetes mellitus with unspecified complications: Secondary | ICD-10-CM | POA: Diagnosis not present

## 2014-11-17 DIAGNOSIS — S82892A Other fracture of left lower leg, initial encounter for closed fracture: Secondary | ICD-10-CM | POA: Diagnosis not present

## 2014-11-17 HISTORY — PX: ORIF ANKLE FRACTURE: SHX5408

## 2014-11-17 LAB — BASIC METABOLIC PANEL
Anion gap: 4 — ABNORMAL LOW (ref 5–15)
BUN: 16 mg/dL (ref 6–23)
CO2: 31 mmol/L (ref 19–32)
Calcium: 8.7 mg/dL (ref 8.4–10.5)
Chloride: 102 mEq/L (ref 96–112)
Creatinine, Ser: 0.95 mg/dL (ref 0.50–1.10)
GFR calc Af Amer: 71 mL/min — ABNORMAL LOW (ref >90)
GFR calc non Af Amer: 62 mL/min — ABNORMAL LOW (ref >90)
Glucose, Bld: 124 mg/dL — ABNORMAL HIGH (ref 70–99)
Potassium: 4.6 mmol/L (ref 3.5–5.1)
Sodium: 137 mmol/L (ref 135–145)

## 2014-11-17 LAB — GLUCOSE, CAPILLARY
Glucose-Capillary: 130 mg/dL — ABNORMAL HIGH (ref 70–99)
Glucose-Capillary: 65 mg/dL — ABNORMAL LOW (ref 70–99)
Glucose-Capillary: 199 mg/dL — ABNORMAL HIGH (ref 70–99)
Glucose-Capillary: 155 mg/dL — ABNORMAL HIGH (ref 70–99)

## 2014-11-17 LAB — CBC
HCT: 32.7 % — ABNORMAL LOW (ref 36.0–46.0)
Hemoglobin: 10.6 g/dL — ABNORMAL LOW (ref 12.0–15.0)
MCH: 27.4 pg (ref 26.0–34.0)
MCHC: 32.4 g/dL (ref 30.0–36.0)
MCV: 84.5 fL (ref 78.0–100.0)
Platelets: 245 10*3/uL (ref 150–400)
RBC: 3.87 MIL/uL (ref 3.87–5.11)
RDW: 13.1 % (ref 11.5–15.5)
WBC: 6.2 10*3/uL (ref 4.0–10.5)

## 2014-11-17 LAB — SURGICAL PCR SCREEN
MRSA, PCR: NEGATIVE
Staphylococcus aureus: NEGATIVE

## 2014-11-17 SURGERY — OPEN REDUCTION INTERNAL FIXATION (ORIF) ANKLE FRACTURE
Anesthesia: General | Site: Ankle | Laterality: Left

## 2014-11-17 MED ORDER — KETOROLAC TROMETHAMINE 30 MG/ML IJ SOLN: 30.0000 mg | Freq: Four times a day (QID) | INTRAMUSCULAR | Status: DC | PRN

## 2014-11-17 MED ORDER — LACTATED RINGERS IV SOLN: INTRAVENOUS | Status: DC | PRN

## 2014-11-17 MED ORDER — TRAZODONE HCL 150 MG PO TABS
150.0000 mg | ORAL_TABLET | Freq: Every day | ORAL | Status: DC
  Administered 2014-11-17: 150 mg via ORAL
  Filled 2014-11-17 (×2): qty 1

## 2014-11-17 MED ORDER — DIPHENHYDRAMINE HCL 12.5 MG/5ML PO ELIX
25.0000 mg | ORAL_SOLUTION | ORAL | Status: DC | PRN
  Filled 2014-11-17: qty 10

## 2014-11-17 MED ORDER — PHENYLEPHRINE 40 MCG/ML (10ML) SYRINGE FOR IV PUSH (FOR BLOOD PRESSURE SUPPORT)
PREFILLED_SYRINGE | INTRAVENOUS | Status: AC
  Filled 2014-11-17: qty 10

## 2014-11-17 MED ORDER — METHOCARBAMOL 500 MG PO TABS
500.0000 mg | ORAL_TABLET | Freq: Four times a day (QID) | ORAL | Status: DC | PRN
  Filled 2014-11-17: qty 1

## 2014-11-17 MED ORDER — ARTIFICIAL TEARS OP OINT
TOPICAL_OINTMENT | OPHTHALMIC | Status: DC | PRN
  Administered 2014-11-17: 1 via OPHTHALMIC

## 2014-11-17 MED ORDER — LABETALOL HCL 5 MG/ML IV SOLN
INTRAVENOUS | Status: AC
  Filled 2014-11-17: qty 4

## 2014-11-17 MED ORDER — GLYCOPYRROLATE 0.2 MG/ML IJ SOLN
INTRAMUSCULAR | Status: DC | PRN
  Administered 2014-11-17: 0.6 mg via INTRAVENOUS

## 2014-11-17 MED ORDER — CLINDAMYCIN PHOSPHATE 600 MG/50ML IV SOLN
600.0000 mg | Freq: Four times a day (QID) | INTRAVENOUS | Status: AC
  Administered 2014-11-17 – 2014-11-18 (×3): 600 mg via INTRAVENOUS
  Filled 2014-11-17 (×3): qty 50

## 2014-11-17 MED ORDER — DEXTROSE 50 % IV SOLN
INTRAVENOUS | Status: AC
Start: 2014-11-17 — End: 2014-11-18
  Filled 2014-11-17: qty 50

## 2014-11-17 MED ORDER — INSULIN ASPART 100 UNIT/ML ~~LOC~~ SOLN
0.0000 [IU] | Freq: Three times a day (TID) | SUBCUTANEOUS | Status: DC
  Administered 2014-11-18: 2 [IU] via SUBCUTANEOUS

## 2014-11-17 MED ORDER — METHOCARBAMOL 750 MG PO TABS: 750.0000 mg | ORAL_TABLET | Freq: Two times a day (BID) | ORAL | Status: DC | PRN

## 2014-11-17 MED ORDER — LACTATED RINGERS IV SOLN
INTRAVENOUS | Status: DC
  Administered 2014-11-17: 13:00:00 via INTRAVENOUS

## 2014-11-17 MED ORDER — DEXTROSE 50 % IV SOLN
25.0000 mL | Freq: Once | INTRAVENOUS | Status: AC
  Administered 2014-11-17: 25 mL via INTRAVENOUS

## 2014-11-17 MED ORDER — OXYCODONE HCL 5 MG PO TABS
5.0000 mg | ORAL_TABLET | ORAL | Status: DC | PRN
  Filled 2014-11-17: qty 3

## 2014-11-17 MED ORDER — LIDOCAINE HCL (CARDIAC) 20 MG/ML IV SOLN
INTRAVENOUS | Status: AC
  Filled 2014-11-17: qty 10

## 2014-11-17 MED ORDER — ONDANSETRON HCL 4 MG PO TABS: 4.0000 mg | ORAL_TABLET | Freq: Four times a day (QID) | ORAL | Status: DC | PRN

## 2014-11-17 MED ORDER — PROPOFOL 10 MG/ML IV BOLUS
INTRAVENOUS | Status: DC | PRN
  Administered 2014-11-17: 200 mg via INTRAVENOUS

## 2014-11-17 MED ORDER — METOCLOPRAMIDE HCL 5 MG/ML IJ SOLN
5.0000 mg | Freq: Three times a day (TID) | INTRAMUSCULAR | Status: DC | PRN
  Filled 2014-11-17: qty 2

## 2014-11-17 MED ORDER — ATORVASTATIN CALCIUM 20 MG PO TABS
20.0000 mg | ORAL_TABLET | Freq: Every day | ORAL | Status: DC
  Administered 2014-11-17: 20 mg via ORAL
  Filled 2014-11-17 (×2): qty 1

## 2014-11-17 MED ORDER — FENTANYL CITRATE 0.05 MG/ML IJ SOLN
INTRAMUSCULAR | Status: DC | PRN
  Administered 2014-11-17 (×2): 50 ug via INTRAVENOUS
  Administered 2014-11-17: 100 ug via INTRAVENOUS

## 2014-11-17 MED ORDER — NEOSTIGMINE METHYLSULFATE 10 MG/10ML IV SOLN
INTRAVENOUS | Status: AC
  Filled 2014-11-17: qty 1

## 2014-11-17 MED ORDER — MUPIROCIN 2 % EX OINT
1.0000 "application " | TOPICAL_OINTMENT | Freq: Once | CUTANEOUS | Status: AC
  Administered 2014-11-17: 1 via TOPICAL

## 2014-11-17 MED ORDER — LOSARTAN POTASSIUM 25 MG PO TABS
25.0000 mg | ORAL_TABLET | Freq: Every day | ORAL | Status: DC
  Administered 2014-11-17 – 2014-11-18 (×2): 25 mg via ORAL
  Filled 2014-11-17 (×2): qty 1

## 2014-11-17 MED ORDER — DEXTROSE 5 % IV SOLN
500.0000 mg | Freq: Four times a day (QID) | INTRAVENOUS | Status: DC | PRN
  Filled 2014-11-17: qty 5

## 2014-11-17 MED ORDER — LABETALOL HCL 5 MG/ML IV SOLN
INTRAVENOUS | Status: DC | PRN
  Administered 2014-11-17 (×7): 5 mg via INTRAVENOUS

## 2014-11-17 MED ORDER — FENTANYL CITRATE 0.05 MG/ML IJ SOLN
INTRAMUSCULAR | Status: AC
  Filled 2014-11-17: qty 5

## 2014-11-17 MED ORDER — 0.9 % SODIUM CHLORIDE (POUR BTL) OPTIME
TOPICAL | Status: DC | PRN
  Administered 2014-11-17: 1000 mL

## 2014-11-17 MED ORDER — MIDAZOLAM HCL 2 MG/2ML IJ SOLN
1.0000 mg | INTRAMUSCULAR | Status: DC | PRN
  Administered 2014-11-17: 1 mg via INTRAVENOUS

## 2014-11-17 MED ORDER — HYDROCODONE-ACETAMINOPHEN 5-325 MG PO TABS
1.0000 | ORAL_TABLET | ORAL | Status: DC | PRN
  Administered 2014-11-18 (×3): 2 via ORAL
  Filled 2014-11-17 (×3): qty 2

## 2014-11-17 MED ORDER — MIDAZOLAM HCL 2 MG/2ML IJ SOLN
INTRAMUSCULAR | Status: AC
  Administered 2014-11-17: 1 mg via INTRAVENOUS
  Filled 2014-11-17: qty 2

## 2014-11-17 MED ORDER — ESMOLOL HCL 10 MG/ML IV SOLN
INTRAVENOUS | Status: AC
  Filled 2014-11-17: qty 10

## 2014-11-17 MED ORDER — MAGNESIUM CITRATE PO SOLN
1.0000 | Freq: Once | ORAL | Status: AC | PRN
Start: 2014-11-17 — End: 2014-11-17
  Filled 2014-11-17: qty 296

## 2014-11-17 MED ORDER — NEOSTIGMINE METHYLSULFATE 10 MG/10ML IV SOLN
INTRAVENOUS | Status: DC | PRN
  Administered 2014-11-17: 4 mg via INTRAVENOUS

## 2014-11-17 MED ORDER — ONDANSETRON HCL 4 MG/2ML IJ SOLN: 4.0000 mg | Freq: Four times a day (QID) | INTRAMUSCULAR | Status: DC | PRN

## 2014-11-17 MED ORDER — ESMOLOL HCL 10 MG/ML IV SOLN
INTRAVENOUS | Status: DC | PRN
  Administered 2014-11-17: 10 mg via INTRAVENOUS
  Administered 2014-11-17 (×2): 20 mg via INTRAVENOUS

## 2014-11-17 MED ORDER — ASPIRIN EC 325 MG PO TBEC
325.0000 mg | DELAYED_RELEASE_TABLET | Freq: Two times a day (BID) | ORAL | Status: DC
  Administered 2014-11-17 – 2014-11-18 (×2): 325 mg via ORAL
  Filled 2014-11-17 (×5): qty 1

## 2014-11-17 MED ORDER — MORPHINE SULFATE 2 MG/ML IJ SOLN: 1.0000 mg | INTRAMUSCULAR | Status: DC | PRN

## 2014-11-17 MED ORDER — SODIUM CHLORIDE 0.9 % IV SOLN
INTRAVENOUS | Status: DC
  Administered 2014-11-17: 23:00:00 via INTRAVENOUS

## 2014-11-17 MED ORDER — ONDANSETRON HCL 4 MG/2ML IJ SOLN
INTRAMUSCULAR | Status: DC | PRN
  Administered 2014-11-17: 4 mg via INTRAVENOUS

## 2014-11-17 MED ORDER — GLYCOPYRROLATE 0.2 MG/ML IJ SOLN
INTRAMUSCULAR | Status: AC
  Filled 2014-11-17: qty 3

## 2014-11-17 MED ORDER — FENTANYL CITRATE 0.05 MG/ML IJ SOLN
INTRAMUSCULAR | Status: AC
Start: 2014-11-17 — End: 2014-11-17
  Administered 2014-11-17: 50 ug via INTRAVENOUS
  Filled 2014-11-17: qty 2

## 2014-11-17 MED ORDER — MIDAZOLAM HCL 5 MG/5ML IJ SOLN
INTRAMUSCULAR | Status: DC | PRN
  Administered 2014-11-17: 2 mg via INTRAVENOUS

## 2014-11-17 MED ORDER — SODIUM CHLORIDE 0.9 % IV SOLN
INTRAVENOUS | Status: DC | PRN
  Administered 2014-11-17 (×2): via INTRAVENOUS

## 2014-11-17 MED ORDER — ROCURONIUM BROMIDE 100 MG/10ML IV SOLN
INTRAVENOUS | Status: DC | PRN
  Administered 2014-11-17: 10 mg via INTRAVENOUS
  Administered 2014-11-17: 50 mg via INTRAVENOUS
  Administered 2014-11-17: 20 mg via INTRAVENOUS

## 2014-11-17 MED ORDER — METOCLOPRAMIDE HCL 10 MG PO TABS: 5.0000 mg | ORAL_TABLET | Freq: Three times a day (TID) | ORAL | Status: DC | PRN

## 2014-11-17 MED ORDER — SENNA 8.6 MG PO TABS
1.0000 | ORAL_TABLET | Freq: Two times a day (BID) | ORAL | Status: DC
  Administered 2014-11-17 – 2014-11-18 (×2): 8.6 mg via ORAL
  Filled 2014-11-17 (×3): qty 1

## 2014-11-17 MED ORDER — OXYCODONE HCL 5 MG PO TABS: 5.0000 mg | ORAL_TABLET | ORAL | Status: DC | PRN

## 2014-11-17 MED ORDER — FENTANYL CITRATE 0.05 MG/ML IJ SOLN
50.0000 ug | INTRAMUSCULAR | Status: DC | PRN
  Administered 2014-11-17: 50 ug via INTRAVENOUS

## 2014-11-17 MED ORDER — MUPIROCIN 2 % EX OINT
1.0000 "application " | TOPICAL_OINTMENT | Freq: Once | CUTANEOUS | Status: DC
  Administered 2014-11-17: 1 via TOPICAL

## 2014-11-17 MED ORDER — SUCCINYLCHOLINE CHLORIDE 20 MG/ML IJ SOLN
INTRAMUSCULAR | Status: AC
  Filled 2014-11-17: qty 1

## 2014-11-17 MED ORDER — INSULIN ASPART 100 UNIT/ML ~~LOC~~ SOLN: 0.0000 [IU] | Freq: Every day | SUBCUTANEOUS | Status: DC

## 2014-11-17 MED ORDER — EPHEDRINE SULFATE 50 MG/ML IJ SOLN
INTRAMUSCULAR | Status: AC
  Filled 2014-11-17: qty 1

## 2014-11-17 MED ORDER — MIDAZOLAM HCL 2 MG/2ML IJ SOLN
INTRAMUSCULAR | Status: AC
  Filled 2014-11-17: qty 2

## 2014-11-17 MED ORDER — ASPIRIN EC 325 MG PO TBEC: 325.0000 mg | DELAYED_RELEASE_TABLET | Freq: Two times a day (BID) | ORAL | Status: DC

## 2014-11-17 MED ORDER — HYDROMORPHONE HCL 1 MG/ML IJ SOLN: 0.2500 mg | INTRAMUSCULAR | Status: DC | PRN

## 2014-11-17 MED ORDER — GLIPIZIDE 5 MG PO TABS
5.0000 mg | ORAL_TABLET | Freq: Every day | ORAL | Status: DC
  Administered 2014-11-17: 5 mg via ORAL
  Filled 2014-11-17 (×2): qty 1

## 2014-11-17 MED ORDER — POLYETHYLENE GLYCOL 3350 17 G PO PACK
17.0000 g | PACK | Freq: Every day | ORAL | Status: DC | PRN
  Filled 2014-11-17: qty 1

## 2014-11-17 MED ORDER — FLUOXETINE HCL 20 MG PO CAPS
20.0000 mg | ORAL_CAPSULE | Freq: Every day | ORAL | Status: DC
  Administered 2014-11-18: 20 mg via ORAL
  Filled 2014-11-17: qty 1

## 2014-11-17 MED ORDER — DEXTROSE 5 % IV SOLN
INTRAVENOUS | Status: DC | PRN
  Administered 2014-11-17: 16:00:00 via INTRAVENOUS

## 2014-11-17 MED ORDER — PANTOPRAZOLE SODIUM 40 MG PO TBEC
40.0000 mg | DELAYED_RELEASE_TABLET | Freq: Every day | ORAL | Status: DC
  Administered 2014-11-18: 40 mg via ORAL
  Filled 2014-11-17: qty 1

## 2014-11-17 MED ORDER — SORBITOL 70 % SOLN: 30.0000 mL | Freq: Every day | Status: DC | PRN

## 2014-11-17 MED ORDER — MUPIROCIN 2 % EX OINT
TOPICAL_OINTMENT | CUTANEOUS | Status: AC
  Administered 2014-11-17: 1 via TOPICAL
  Filled 2014-11-17: qty 22

## 2014-11-17 MED ORDER — SODIUM CHLORIDE 0.9 % IJ SOLN
INTRAMUSCULAR | Status: AC
  Filled 2014-11-17: qty 10

## 2014-11-17 SURGICAL SUPPLY — 80 items
BANDAGE ELASTIC 3 VELCRO ST LF (GAUZE/BANDAGES/DRESSINGS) ×3 IMPLANT
BANDAGE ELASTIC 4 VELCRO ST LF (GAUZE/BANDAGES/DRESSINGS) IMPLANT
BANDAGE ELASTIC 6 VELCRO ST LF (GAUZE/BANDAGES/DRESSINGS) ×3 IMPLANT
BANDAGE ESMARK 6X9 LF (GAUZE/BANDAGES/DRESSINGS) ×1 IMPLANT
BIT DRILL CANN 2.7 (BIT) ×1
BIT DRILL CANN 2.7MM (BIT) ×1
BIT DRILL SRG 2.7XCANN AO CPLG (BIT) ×1 IMPLANT
BIT DRL SRG 2.7XCANN AO CPLNG (BIT) ×1
BNDG COHESIVE 4X5 TAN STRL (GAUZE/BANDAGES/DRESSINGS) ×3 IMPLANT
BNDG COHESIVE 6X5 TAN STRL LF (GAUZE/BANDAGES/DRESSINGS) ×6 IMPLANT
BNDG ESMARK 6X9 LF (GAUZE/BANDAGES/DRESSINGS) ×3
CANISTER SUCTION WELLS/JOHNSON (MISCELLANEOUS) ×3 IMPLANT
COVER SURGICAL LIGHT HANDLE (MISCELLANEOUS) ×3 IMPLANT
CUFF TOURNIQUET SINGLE 34IN LL (TOURNIQUET CUFF) ×3 IMPLANT
CUFF TOURNIQUET SINGLE 44IN (TOURNIQUET CUFF) IMPLANT
DRAPE C-ARM 42X72 X-RAY (DRAPES) ×6 IMPLANT
DRAPE C-ARMOR (DRAPES) ×3 IMPLANT
DRAPE IMP U-DRAPE 54X76 (DRAPES) ×3 IMPLANT
DRAPE INCISE IOBAN 66X45 STRL (DRAPES) ×3 IMPLANT
DRAPE U-SHAPE 47X51 STRL (DRAPES) ×3 IMPLANT
DRILL 2.6X122MM WL AO SHAFT (BIT) ×3 IMPLANT
DURAPREP 26ML APPLICATOR (WOUND CARE) ×3 IMPLANT
ELECT CAUTERY BLADE 6.4 (BLADE) ×3 IMPLANT
ELECT REM PT RETURN 9FT ADLT (ELECTROSURGICAL) ×3
ELECTRODE REM PT RTRN 9FT ADLT (ELECTROSURGICAL) ×1 IMPLANT
FACESHIELD WRAPAROUND (MASK) ×3 IMPLANT
GAUZE SPONGE 4X4 12PLY STRL (GAUZE/BANDAGES/DRESSINGS) ×3 IMPLANT
GAUZE XEROFORM 5X9 LF (GAUZE/BANDAGES/DRESSINGS) ×3 IMPLANT
GLOVE BIOGEL PI IND STRL 6.5 (GLOVE) ×1 IMPLANT
GLOVE BIOGEL PI INDICATOR 6.5 (GLOVE) ×2
GLOVE BIOGEL PI ORTHO PRO 7.5 (GLOVE) ×2
GLOVE NEODERM STRL 7.5 LF PF (GLOVE) ×1 IMPLANT
GLOVE PI ORTHO PRO STRL 7.5 (GLOVE) ×1 IMPLANT
GLOVE SURG NEODERM 7.5  LF PF (GLOVE) ×2
GLOVE SURG SS PI 6.5 STRL IVOR (GLOVE) ×3 IMPLANT
GLOVE SURG SS PI 7.0 STRL IVOR (GLOVE) ×3 IMPLANT
GLOVE SURG SYN 7.5  E (GLOVE) ×4
GLOVE SURG SYN 7.5 E (GLOVE) ×2 IMPLANT
GOWN STRL REIN XL XLG (GOWN DISPOSABLE) ×3 IMPLANT
GOWN STRL REUS W/ TWL XL LVL3 (GOWN DISPOSABLE) ×2 IMPLANT
GOWN STRL REUS W/TWL XL LVL3 (GOWN DISPOSABLE) ×4
K-WIRE 1.4X100 (WIRE) ×6
K-WIRE ORTHOPEDIC 1.4X150L (WIRE) ×3
KIT BASIN OR (CUSTOM PROCEDURE TRAY) ×3 IMPLANT
KIT ROOM TURNOVER OR (KITS) ×3 IMPLANT
KWIRE 1.4X100 (WIRE) ×2 IMPLANT
KWIRE ORTHOPEDIC 1.4X150L (WIRE) ×1 IMPLANT
NEEDLE HYPO 25GX1X1/2 BEV (NEEDLE) ×3 IMPLANT
NS IRRIG 1000ML POUR BTL (IV SOLUTION) ×3 IMPLANT
PACK ORTHO EXTREMITY (CUSTOM PROCEDURE TRAY) ×3 IMPLANT
PAD ARMBOARD 7.5X6 YLW CONV (MISCELLANEOUS) ×6 IMPLANT
PAD CAST 3X4 CTTN HI CHSV (CAST SUPPLIES) ×2 IMPLANT
PADDING CAST COTTON 3X4 STRL (CAST SUPPLIES) ×4
PADDING CAST COTTON 6X4 STRL (CAST SUPPLIES) ×3 IMPLANT
PLATE 8H 108MM (Plate) ×3 IMPLANT
PUTTY BONE DBX 2.5 MIS (Bone Implant) ×3 IMPLANT
SCREW BONE 14MMX3.5MM (Screw) ×6 IMPLANT
SCREW BONE 18 (Screw) ×3 IMPLANT
SCREW BONE NON-LCKING 3.5X12MM (Screw) ×3 IMPLANT
SCREW CANNULATED 4.0 (Screw) ×3 IMPLANT
SCREW CANNULATED 4.0X38MM (Screw) ×3 IMPLANT
SCREW NONLOCK 24MM (Screw) ×3 IMPLANT
SPLINT FIBERGLASS 4X30 (CAST SUPPLIES) ×3 IMPLANT
SPONGE GAUZE 4X4 12PLY STER LF (GAUZE/BANDAGES/DRESSINGS) ×3 IMPLANT
SPONGE LAP 18X18 X RAY DECT (DISPOSABLE) IMPLANT
SUCTION FRAZIER TIP 10 FR DISP (SUCTIONS) ×3 IMPLANT
SUT ETHIBOND NAB CT1 #1 30IN (SUTURE) ×3 IMPLANT
SUT ETHILON 2 0 FS 18 (SUTURE) IMPLANT
SUT ETHILON 3 0 PS 1 (SUTURE) ×3 IMPLANT
SUT VIC AB 0 CT1 27 (SUTURE)
SUT VIC AB 0 CT1 27XBRD ANBCTR (SUTURE) IMPLANT
SUT VIC AB 2-0 CT1 27 (SUTURE) ×4
SUT VIC AB 2-0 CT1 TAPERPNT 27 (SUTURE) ×2 IMPLANT
SYR CONTROL 10ML LL (SYRINGE) IMPLANT
TOWEL OR 17X24 6PK STRL BLUE (TOWEL DISPOSABLE) ×6 IMPLANT
TOWEL OR 17X26 10 PK STRL BLUE (TOWEL DISPOSABLE) ×9 IMPLANT
TRAY FOLEY CATH 14FRSI W/METER (CATHETERS) ×3 IMPLANT
TUBE CONNECTING 12'X1/4 (SUCTIONS) ×1
TUBE CONNECTING 12X1/4 (SUCTIONS) ×2 IMPLANT
WASHER ASNIS III 4.0 SCR (Washer) ×6 IMPLANT

## 2014-11-17 NOTE — H&P (Signed)

## 2014-11-17 NOTE — Transfer of Care (Signed)
Immediate Anesthesia Transfer of Care Note  Patient: Abigail [name] [facility]  Procedure(s) Performed: Procedure(s): OPEN REDUCTION INTERNAL FIXATION (ORIF) LEFT TRIMALLEOLAR ANKLE FRACTURE (Left)  Patient Location: PACU  Anesthesia Type:General  Level of Consciousness: lethargic and responds to stimulation  Airway & Oxygen Therapy: Patient Spontanous Breathing and Patient connected to face mask oxygen  Post-op Assessment: Report given to PACU RN  Post vital signs: Reviewed and stable  Complications: No apparent anesthesia complications

## 2014-11-17 NOTE — Anesthesia Preprocedure Evaluation (Addendum)
Anesthesia Evaluation  Patient identified by MRN, date of birth, ID band Patient awake    Reviewed: Allergy & Precautions, NPO status , Patient's Chart, lab work & pertinent test results, reviewed documented beta blocker date and time   Airway Mallampati: II       Dental  (+) Teeth Intact   Pulmonary neg pulmonary ROS,  breath sounds clear to auscultation        Cardiovascular hypertension, Pt. on medications Rhythm:Regular     Neuro/Psych Anxiety    GI/Hepatic GERD-  Medicated,  Endo/Other  diabetes, Type 2  Renal/GU negative Renal ROS  negative genitourinary   Musculoskeletal   Abdominal (+)  Abdomen: soft.    Peds  Hematology negative hematology ROS (+) 10/32 H/H   Anesthesia Other Findings   Reproductive/Obstetrics                            Anesthesia Physical Anesthesia Plan  ASA: II  Anesthesia Plan: General   Post-op Pain Management: MAC Combined w/ Regional for Post-op pain   Induction: Intravenous  Airway Management Planned: LMA and Oral ETT  Additional Equipment:   Intra-op Plan:   Post-operative Plan:   Informed Consent: I have reviewed the patients History and Physical, chart, labs and discussed the procedure including the risks, benefits and alternatives for the proposed anesthesia with the patient or authorized representative who has indicated his/her understanding and acceptance.     Plan Discussed with:   Anesthesia Plan Comments:         Anesthesia Quick Evaluation

## 2014-11-17 NOTE — Anesthesia Procedure Notes (Addendum)
Anesthesia Regional Block:  Popliteal block  Pre-Anesthetic Checklist: ,, timeout performed, Correct Patient, Correct Site, Correct Laterality, Correct Procedure, Correct Position, site marked, Risks and benefits discussed, Surgical consent,  At surgeon's request and post-op pain management  Laterality: Left and Lower  Prep: Maximum Sterile Barrier Precautions used and chloraprep       Needles:   Needle Type: Echogenic Stimulator Needle     Needle Length: 9cm 9 cm Needle Gauge: 21 and 21 G    Additional Needles:  Procedures: ultrasound guided (picture in chart) and nerve stimulator Popliteal block  Nerve Stimulator or Paresthesia:  Response: 0.4 mA,   Additional Responses:   Narrative:  Start time: 11/17/2014 2:41 PM End time: 11/17/2014 2:52 PM  Performed by: Personally  Anesthesiologist: Alexis Frock  Additional Notes: L adductor canal  Bock with [place] and stimulator, 66ml of .5% marcaine with epi, talked to patient throughout, no complications, multiple aspirations   Procedure Name: Intubation Date/Time: 11/17/2014 3:38 PM Performed by: Williemae Area B Pre-anesthesia Checklist: Patient identified, Emergency Drugs available, Suction available and Patient being monitored Patient Re-evaluated:Patient Re-evaluated prior to inductionOxygen Delivery Method: Circle system utilized Preoxygenation: Pre-oxygenation with 100% oxygen Intubation Type: IV induction Ventilation: Mask ventilation without difficulty and Oral airway inserted - appropriate to patient size Laryngoscope Size: Mac and 3 Grade View: Grade II Tube type: Oral Tube size: 7.0 mm Number of attempts: 1 Airway Equipment and Method: Stylet Placement Confirmation: ETT inserted through vocal cords under direct vision,  breath sounds checked- equal and bilateral and positive ETCO2 Secured at: 21 (cm at teeth) cm Tube secured with: Tape Dental Injury: Teeth and Oropharynx as per pre-operative assessment

## 2014-11-17 NOTE — Op Note (Signed)
Date of Surgery: 11/17/2014  INDICATIONS: Abigail [name] is a 64 y.o.-year-old female who sustained a left ankle fracture; she was indicated for open reduction and internal fixation due to the displaced nature of the articular fracture and came to the operating room today for this procedure. The patient did consent to the procedure after discussion of the risks and benefits.  PREOPERATIVE DIAGNOSIS: left trimalleolar ankle fracture  POSTOPERATIVE DIAGNOSIS: Same.  PROCEDURE: Open treatment of left ankle fracture with internal fixation. Trimalleolar w/ fixation of posterior malleolus CPT 27823.  SURGEON: N. Eduard Roux, M.D.  ASSIST: none.  ANESTHESIA:  general, regional  IV FLUIDS AND URINE: See anesthesia.  ESTIMATED BLOOD LOSS: minimal mL.  IMPLANTS: Stryker Variax 8 hole straight fibula plate  COMPLICATIONS: None.  DESCRIPTION OF PROCEDURE: The patient was brought to the operating room and placed prone on the operating table.  The patient had been signed prior to the procedure and this was documented. The patient had the anesthesia placed by the anesthesiologist.  A nonsterile tourniquet was placed on the upper thigh.  The prep verification and incision time-outs were performed to confirm that this was the correct patient, site, side and location. The patient had an SCD on the opposite lower extremity. The patient did receive antibiotics prior to the incision and was re-dosed during the procedure as needed at indicated intervals.  The patient had the lower extremity prepped and draped in the standard surgical fashion.  The extremity was exsanguinated using an esmarch bandage and the tourniquet was inflated to 300 mm Hg.  A standard posterior lateral approach to the ankle was used. Careful blunt dissection through the subcutaneous tissue was carried out down to the level of the fascia. The sural nerve and its branches were identified and protected throughout the procedure. The fascia was  then sharply incised. The peroneal tendons and muscle belly were reflected off of the posterior and lateral aspects of the fibula. The FHL was also identified and raised off of the posterior aspect of the tibia. We first addressed the fibula fracture. The fibula fracture had an extensive amount of comminution in multiple planes. This made reduction difficult to assess. Any organized hematoma and entrapped periosteum was taken out of the fracture sites. There were 2 large butterfly fragments. Once we got the fibula out to length and adequately reduced under fluoroscopy a Stryker 8 hole semitubular plate was placed on the posterior aspect of the fibula. Care was taken not to have the plate impinging the peroneal tendons. Given the amount of comminution of the fracture the plate was placed in a bridge fashion. 3 nonlocking screws were placed proximal to the fracture and 2 nonlocking screws were placed distal to the fracture. Of note she did have good bone quality. Once the fibula was fixed we then addressed the posterior malleolus fracture. We exposed the fracture line. We will treat any organized hematoma and entrapped periosteum. By using ankle dorsiflexion and a ball spike pusher we are able to adequately reduce the posterior malleolus piece. This was provisionally fixed with K wires. We then inserted 2 parallel cannulated 4.0 mm partially threaded screws from a posterior to anterior direction. This was confirmed under fluoroscopy. The screws allowed [place] to achieve compression across the fracture on serial x-rays. Once this was done I then turned my attention to the void in the fibula. I felt that given the amount of comminution I needed to place demineralized bone matrix in the fracture site. I then used a #1  Ethibond to lasso the butterfly fragments to the main fibula.  The wound was then thoroughly irrigated and closed in layer fashion using 2-0 Vicryl for the deep skin layer and 3-0 nylon for the skin. Sterile  dressings were applied foot was immobilized in a short-leg splint. Patient was extubated and transferred to the PACU in stable condition.  POSTOPERATIVE PLAN: Abigail [name] will remain nonweightbearing on this leg for approximately 6 weeks; Abigail [name] will return for suture removal in 2 weeks.  She will be immobilized in a short leg splint and then transitioned to a short leg cast at his first follow up appointment.  Abigail [name] will receive DVT prophylaxis based on other medications, activity level, and risk ratio of bleeding to thrombosis.  Azucena Cecil, MD [place] 6:34 PM

## 2014-11-17 NOTE — Progress Notes (Signed)
Notified Dr. Tresa Moore about patient blood glucose 65, new orders received. Will continue to monitor.

## 2014-11-17 NOTE — Discharge Instructions (Signed)
1. Do not put any pressure on left foot 2. Keep splint clean and dry and intact 3. Must take aspirin to prevent blood clots

## 2014-11-18 ENCOUNTER — Encounter (HOSPITAL_COMMUNITY): Payer: Self-pay | Admitting: Orthopaedic Surgery

## 2014-11-18 DIAGNOSIS — E118 Type 2 diabetes mellitus with unspecified complications: Secondary | ICD-10-CM | POA: Diagnosis not present

## 2014-11-18 DIAGNOSIS — M25572 Pain in left ankle and joints of left foot: Secondary | ICD-10-CM | POA: Diagnosis not present

## 2014-11-18 DIAGNOSIS — S82852A Displaced trimalleolar fracture of left lower leg, initial encounter for closed fracture: Secondary | ICD-10-CM | POA: Diagnosis not present

## 2014-11-18 DIAGNOSIS — F419 Anxiety disorder, unspecified: Secondary | ICD-10-CM | POA: Diagnosis not present

## 2014-11-18 DIAGNOSIS — I10 Essential (primary) hypertension: Secondary | ICD-10-CM | POA: Diagnosis not present

## 2014-11-18 LAB — GLUCOSE, CAPILLARY
Glucose-Capillary: 146 mg/dL — ABNORMAL HIGH (ref 70–99)
Glucose-Capillary: 174 mg/dL — ABNORMAL HIGH (ref 70–99)

## 2014-11-18 NOTE — Evaluation (Signed)
Physical Therapy Evaluation Patient Details Name: Abigail [name] MRN: [id number] DOB: 1951/02/18 Today's Date: 11/18/2014   History of Present Illness  Pt is a 64 yo female admitted after a fall down front steps resulting in a L trimalleolar ankle fx.  Pt underwent an ORIF on 11/17/14 and is now NWB on LLE.  Clinical Impression  Pt and husband have shown ability to mobilize pt safely in a home-like environment.  Emphasized negotiation of their entry stairs and came up with a suitable/safe option.    Follow Up Recommendations Home health PT    Equipment Recommendations  3in1 (PT)    Recommendations for Other Services       Precautions / Restrictions Precautions Precautions: Fall Precaution Comments: Pt had fall that brought her into hospital.  Pt tends to move too quickly with walker. Required Braces or Orthoses: Other Brace/Splint Other Brace/Splint: cast on LLE. Restrictions Weight Bearing Restrictions: Yes LLE Weight Bearing: Non weight bearing      Mobility  Bed Mobility Overal bed mobility: Modified Independent             General bed mobility comments: used bed rails.  Transfers Overall transfer level: Needs assistance Equipment used: Rolling walker (2 wheeled) Transfers: Sit to/from [facility] Sit to Stand: Min guard;Min assist         General transfer comment: Cues for hand placment.  Spoke at length about how to use rollator walker at home and the difference between the two and that it might be preferable to get the 2 wheeled RW  Ambulation/Gait Ambulation/Gait assistance: Min guard Ambulation Distance (Feet): 20 Feet (x2 and 15 feet with RW) Assistive device: Rolling walker (2 wheeled) Gait Pattern/deviations: Step-to pattern     General Gait Details: mildly unsteady "swing to" gait at times when fatigues otherwise generally steady.  Stairs Stairs: Yes Stairs assistance: Mod assist Stair Management: One rail  Left;Seated/boosting Number of Stairs: 4 General stair comments: Discussed multiple appropriate ways to negotiate her stairs and decided on boosting up seated and graduated stand up via stool and chair onto stoop.  Wheelchair Mobility    Modified Rankin (Stroke Patients Only)       Balance Overall balance assessment: Needs assistance Sitting-balance support: No upper extremity supported Sitting balance-Leahy Scale: Good     Standing balance support: Bilateral upper extremity supported Standing balance-Leahy Scale: Poor Standing balance comment: Pt is NWB therefore it is difficult to let go of walker with both hands.                             Pertinent Vitals/Pain Pain Assessment: 0-10 Pain Score: 3  Pain Descriptors / Indicators: Aching Pain Intervention(s): Monitored during session    Home Living Family/patient expects to be discharged to:: Private residence Living Arrangements: Spouse/significant other Available Help at Discharge: Family Type of Home: House Home Access: Stairs to enter Entrance Stairs-Rails: Right;Left;Can reach both Technical brewer of Steps: 4 Home Layout: One level Home Equipment: Walker - 4 wheels Additional Comments: Pt lives in one story home with 5 steps and rail to enter.  Home otherwise all on one level.  Pt has regular commode seats and a tub shower combonation.  Pt's walker does not fit into bathroom and therefore will need BSC.  Pt can hop into bathroom with husbands assist bc it is so small but will need tub bench b/c she is NWB LLE    Prior Function Level of  Independence: Independent               Hand Dominance   Dominant Hand: Right    Extremity/Trunk Assessment   Upper Extremity Assessment: Defer to OT evaluation           Lower Extremity Assessment: Overall WFL for tasks assessed      Cervical / Trunk Assessment: Normal  Communication   Communication: No difficulties  Cognition  Arousal/Alertness: Awake/alert Behavior During Therapy: WFL for tasks assessed/performed Overall Cognitive Status: Within Functional Limits for tasks assessed                      General Comments General comments (skin integrity, edema, etc.): pt and husband should do well with safety at home especially with new equipment.  Pt and husband feel comfortable with the stair method we discussed.    Exercises        Assessment/Plan    PT Assessment All further PT needs can be met in the next venue of care  PT Diagnosis Difficulty walking;Acute pain   PT Problem List Decreased strength;Decreased activity tolerance;Decreased balance;Decreased mobility;Decreased knowledge of use of DME;Pain  PT Treatment Interventions     PT Goals (Current goals can be found in the Care Plan section) Acute Rehab PT Goals Patient Stated Goal: to go home. PT Goal Formulation: All assessment and education complete, DC therapy    Frequency     Barriers to discharge        Co-evaluation               End of Session   Activity Tolerance: Patient tolerated treatment well Patient left: in bed;with call bell/phone within reach;with family/visitor present (sitting EO) Nurse Communication: Mobility status    Functional Assessment Tool Used: clinical judgement Functional Limitation: Mobility: Walking and moving around Mobility: Walking and Moving Around Current Status (H3125): At least 1 percent but less than 20 percent impaired, limited or restricted Mobility: Walking and Moving Around Goal Status [phone]): At least 1 percent but less than 20 percent impaired, limited or restricted Mobility: Walking and Moving Around Discharge Status [phone]): At least 1 percent but less than 20 percent impaired, limited or restricted    Time: 1120-1156 PT Time Calculation (min) (ACUTE ONLY): 36 min   Charges:   PT Evaluation $Initial PT Evaluation Tier I: 1 Procedure PT Treatments $Gait Training: 8-22  mins $Therapeutic Activity: 8-22 mins   PT G Codes:   PT G-Codes **NOT FOR INPATIENT CLASS** Functional Assessment Tool Used: clinical judgement Functional Limitation: Mobility: Walking and moving around Mobility: Walking and Moving Around Current Status (K4753): At least 1 percent but less than 20 percent impaired, limited or restricted Mobility: Walking and Moving Around Goal Status [phone]): At least 1 percent but less than 20 percent impaired, limited or restricted Mobility: Walking and Moving Around Discharge Status [phone]): At least 1 percent but less than 20 percent impaired, limited or restricted    Duru Reiger, Tessie Fass 11/18/2014, 1:21 PM 11/18/2014  Donnella Sham, [place] [phone]  (pager)

## 2014-11-18 NOTE — Progress Notes (Signed)
   Subjective:  Patient reports pain as moderate.  No events.  Objective:   VITALS:   Filed Vitals:   11/17/14 1959 11/17/14 2002 11/17/14 2009 11/18/14 0523  BP:  149/70  131/57  Pulse:  91  116  Temp:  97.8 F (36.6 C)  98.9 F (37.2 C)  TempSrc:  Oral  Oral  Resp:  18  18  Height: 5\' 6"  (1.676 m)     Weight: 69.9 kg (154 lb 1.6 oz)     SpO2:  95% 95% 92%    Neurologically intact Neurovascular intact Sensation intact distally Intact pulses distally Dorsiflexion/Plantar flexion intact Incision: dressing C/D/I and no drainage No cellulitis present Compartment soft   Lab Results  Component Value Date   WBC 6.2 11/17/2014   HGB 10.6* 11/17/2014   HCT 32.7* 11/17/2014   MCV 84.5 11/17/2014   PLT 245 11/17/2014     Assessment/Plan:  1 Day Post-Op   - Up with PT/OT - DVT ppx - SCDs, ambulation, asa - NWB left lower extremity - Pain control - home after PT  Marianna Payment 11/18/2014, 7:51 AM [phone]

## 2014-11-18 NOTE — Evaluation (Signed)
Occupational Therapy Evaluation Patient Details Name: Abigail [name] MRN: [id number] DOB: May 09, 1951 Today's Date: 11/18/2014    History of Present Illness Pt is a 64 yo female admitted after a fall down front steps resulting in a L trimalleolar ankle fx.  Pt underwent an ORIF on 11/17/14 and is now NWB on LLE.   Clinical Impression   Pt admitted with the above diagnosis and has the deficits listed below. Pt would benefit from cont OT to increase I with basic adls and safety with adl transfers so she can be safe at home with her husband.  Pt would benefit from [facility] to address adls at home since her set up is different and because pt did take a few "extra" falls while living at home for a week NWB waiting for this surgery.  Pt's husband uses a cane and will not be able to catch this pt if she starts to fall.  He could not catch her the few times she fell in the last week.  Because of these falls, therapist strongly recommends a tub bench and 3:1 BSC.    Follow Up Recommendations  Home health OT;Supervision/Assistance - 24 hour    Equipment Recommendations  3 in 1 bedside comode;Tub/shower bench    Recommendations for Other Services       Precautions / Restrictions Precautions Precautions: Fall Precaution Comments: Pt had fall that brought her into hospital.  Pt tends to move too quickly with walker. Required Braces or Orthoses: Other Brace/Splint Other Brace/Splint: cast on LLE. Restrictions Weight Bearing Restrictions: Yes LLE Weight Bearing: Non weight bearing      Mobility Bed Mobility Overal bed mobility: Modified Independent             General bed mobility comments: used bed rails.  Transfers Overall transfer level: Needs assistance Equipment used: Rolling walker (2 wheeled) Transfers: Sit to/from [facility] Sit to Stand: Min guard Stand pivot transfers: Min assist       General transfer comment: Cues for hand placment.  Spoke at length  about how to use rollator walker at home and the difference between the two.    Balance Overall balance assessment: Needs assistance Sitting-balance support: No upper extremity supported;Feet supported Sitting balance-Leahy Scale: Good     Standing balance support: Bilateral upper extremity supported;During functional activity Standing balance-Leahy Scale: Poor Standing balance comment: Pt is NWB therefore it is difficult to let go of walker with both hands.                            ADL Overall ADL's : Needs assistance/impaired Eating/Feeding: Independent   Grooming: Wash/dry hands;Wash/dry face;Oral care;Brushing hair;Min guard;Standing Grooming Details (indicate cue type and reason): Pt gets fatigued standing on one foot and may need to sit to do grooming tasks etc. Upper Body Bathing: Set up;Sitting   Lower Body Bathing: Minimal assistance;Sit to/from stand Lower Body Bathing Details (indicate cue type and reason): min assist in standing when washing bottom.  Pt needs cues to hold to walker with one hand while washing with other hand.  Pt does seem to lose balance easily. Upper Body Dressing : Set up;Sitting   Lower Body Dressing: Minimal assistance;Sit to/from stand Lower Body Dressing Details (indicate cue type and reason): min assist for balance when standing and pulling pants up. Toilet Transfer: Minimal assistance;Comfort height toilet Toilet Transfer Details (indicate cue type and reason): pt needed cues to reach back for surface going  to and push up from the surface she is leaving. Toileting- Clothing Manipulation and Hygiene: Minimal assistance;Sit to/from stand Toileting - Clothing Manipulation Details (indicate cue type and reason): Cues required to always hold onto walker with one hand since she is NWB on the LLE. Tub/ Shower Transfer: Tub transfer;Minimal assistance;Tub bench Tub/Shower Transfer Details (indicate cue type and reason): instructed pt on use  of tub bench.  Simulated but will need to review. Functional mobility during ADLs: Minimal assistance;Rolling walker General ADL Comments: Pt seems to always be in a hurry which makes her an increased fall risk.  Instructed pt to slow down and take her time.  Cues given for adl techniques.  Pt has been living at home NWB since her fall a week ago BUT has taken a few falls while at home in this last week.     Vision                     Perception     Praxis      Pertinent Vitals/Pain Pain Assessment: 0-10 Pain Score: 3  Pain Location: L ankle Pain Descriptors / Indicators: Aching Pain Intervention(s): Premedicated before session;Repositioned     Hand Dominance Right   Extremity/Trunk Assessment Upper Extremity Assessment Upper Extremity Assessment: Overall WFL for tasks assessed   Lower Extremity Assessment Lower Extremity Assessment: Defer to PT evaluation   Cervical / Trunk Assessment Cervical / Trunk Assessment: Normal   Communication Communication Communication: No difficulties   Cognition Arousal/Alertness: Awake/alert Behavior During Therapy: WFL for tasks assessed/performed Overall Cognitive Status: Within Functional Limits for tasks assessed                     General Comments       Exercises       Shoulder Instructions      Home Living Family/patient expects to be discharged to:: Private residence Living Arrangements: Spouse/significant other                               Additional Comments: Pt lives in one story home with 5 steps and rail to enter.  Home otherwise all on one level.  Pt has regular commode seats and a tub shower combonation.  Pt's walker does not fit into bathroom and therefore will need BSC.  Pt can hop into bathroom with husbands assist bc it is so small but will need tub bench b/c she is NWB LLE      Prior Functioning/Environment Level of Independence: Independent             OT Diagnosis:  Generalized weakness;Acute pain   OT Problem List: Decreased strength;Impaired balance (sitting and/or standing);Decreased knowledge of use of DME or AE;Decreased safety awareness;Pain   OT Treatment/Interventions: Self-care/ADL training;Therapeutic activities;DME and/or AE instruction    OT Goals(Current goals can be found in the care plan section) Acute Rehab OT Goals Patient Stated Goal: to go home. OT Goal Formulation: With patient Time For Goal Achievement: 11/25/14 Potential to Achieve Goals: Good ADL Goals Pt Will Perform Lower Body Bathing: with supervision;sit to/from stand Pt Will Perform Lower Body Dressing: with supervision;sit to/from stand Pt Will Perform Tub/Shower Transfer: Tub transfer;with supervision;tub bench;rolling walker;ambulating Additional ADL Goal #1: Pt will complete all aspects of toileting on raised commode with S.  OT Frequency: Min 2X/week   Barriers to D/C:    husband there 24/7  Co-evaluation              End of Session Equipment Utilized During Treatment: Rolling walker Nurse Communication: Mobility status  Activity Tolerance: Patient tolerated treatment well Patient left: in chair;with call bell/phone within reach;with family/visitor present   Time: 1610-9604 OT Time Calculation (min): 32 min Charges:  OT General Charges $OT Visit: 1 Procedure OT Evaluation $Initial OT Evaluation Tier I: 1 Procedure OT Treatments $Self Care/Home Management : 23-37 mins G-Codes: OT G-codes **NOT FOR INPATIENT CLASS** Functional Assessment Tool Used: clinical judgement Functional Limitation: Self care Self Care Current Status (V4098): At least 1 percent but less than 20 percent impaired, limited or restricted Self Care Goal Status (J1914): At least 1 percent but less than 20 percent impaired, limited or restricted  Glenford Peers 11/18/2014, 9:32 AM  [phone]

## 2014-11-18 NOTE — Progress Notes (Signed)
Patient discharged per orders. All d/c instructions/orders/home care/medications/follow up appointments discussed. Time allowed for questions and concerns. Pt's husband at bedside. Pt and her husband state they have no questions or concerns at this time. Pt discharged via wheelchair by volunteers. Husband to transport home. Roselyn Reef Jarvin Ogren,RN

## 2014-11-18 NOTE — Anesthesia Postprocedure Evaluation (Signed)
  Anesthesia Post-op Note  Patient: Abigail [name] [facility]  Procedure(s) Performed: Procedure(s): OPEN REDUCTION INTERNAL FIXATION (ORIF) LEFT TRIMALLEOLAR ANKLE FRACTURE (Left)  Patient Location: PACU  Anesthesia Type:General and block  Level of Consciousness: awake and alert   Airway and Oxygen Therapy: Patient Spontanous Breathing  Post-op Pain: moderate  Post-op Assessment: Post-op Vital signs reviewed, Patient's Cardiovascular Status Stable and Respiratory Function Stable  Post-op Vital Signs: Reviewed  Filed Vitals:   11/18/14 0523  BP: 131/57  Pulse: 116  Temp: 37.2 C  Resp: 18    Complications: No apparent anesthesia complications

## 2014-11-18 NOTE — Progress Notes (Signed)
Orders to pull Foley out postop day 1. Per charge nurse Abran Richard, Day shift pulls the foley out on this unit. Patient also request to allow 0615 pain meds to take effect before pulling out. Will report off to am nurse.

## 2014-11-18 NOTE — Progress Notes (Signed)
CARE MANAGEMENT NOTE 11/18/2014  Patient:  Abigail [name], Abigail [name]   Account Number:  [id number]  Date Initiated:  11/18/2014  Documentation initiated by:  [facility]  Subjective/Objective Assessment:   OPEN REDUCTION INTERNAL FIXATION (ORIF) LEFT TRIMALLEOLAR ANKLE FRACTURE on 11/17/2014     Action/Plan:   lives at home with husband, Stormey Wilborn   Anticipated DC Date:  11/18/2014   Anticipated DC Plan:  [place]  CM consult      Choice offered to / List presented to:             Status of service:  Completed, signed off Medicare Important Message given?   (If response is "NO", the following Medicare IM given date fields will be blank) Date Medicare IM given:   Medicare IM given by:   Date Additional Medicare IM given:   Additional Medicare IM given by:    Discharge Disposition:  HOME/SELF CARE  Per UR Regulation:    If discussed at Long Length of Stay Meetings, dates discussed:    Comments:  11/18/2014 1200 NCM spoke to pt and states she does not want HH at this time. Will wait until she sees the orthopedic at her appt for instructions on HH/Outpt PT/OT. States she has RW with seat and crutches at home. Waiting PT recommendations for home.  Jonnie Finner RN CCM Case Mgmt phone [phone]

## 2014-11-18 NOTE — Discharge Summary (Signed)
Physician Discharge Summary      Patient ID: Abigail [name] MRN: [id number] DOB/AGE: [birth year] 66 y.o.  Admit date: 11/17/2014 Discharge date: 11/18/2014  Admission Diagnoses:  Left trimalleolar ankle fracture  Discharge Diagnoses:  Active Problems:   Trimalleolar fracture of ankle, closed   Past Medical History  Diagnosis Date  . Hypertension   . Hyperlipidemia   . Anxiety   . GERD (gastroesophageal reflux disease)   . Diabetes mellitus     Type 2  . Arthritis     Surgeries: Procedure(s): OPEN REDUCTION INTERNAL FIXATION (ORIF) LEFT TRIMALLEOLAR ANKLE FRACTURE on 11/17/2014   Consultants (if any):    Discharged Condition: Improved  Hospital Course: Abigail [name] is an 64 y.o. female who was admitted 11/17/2014 with a diagnosis of left ankle fracture and went to the operating room on 11/17/2014 and underwent the above named procedures.    She was given perioperative antibiotics:      Anti-infectives    Start     Dose/Rate Route Frequency Ordered Stop   11/17/14 2130  clindamycin (CLEOCIN) IVPB 600 mg     600 mg100 mL/hr over 30 Minutes Intravenous Every 6 hours 11/17/14 2008 11/18/14 1529   11/17/14 0600  clindamycin (CLEOCIN) IVPB 900 mg     900 mg100 mL/hr over 30 Minutes Intravenous On call to O.R. 11/16/14 1343 11/17/14 1610    .  She was given sequential compression devices, early ambulation, and aspirin for DVT prophylaxis.  She benefited maximally from the hospital stay and there were no complications.    Recent vital signs:  Filed Vitals:   11/18/14 0523  BP: 131/57  Pulse: 116  Temp: 98.9 F (37.2 C)  Resp: 18    Recent laboratory studies:  Lab Results  Component Value Date   HGB 10.6* 11/17/2014   HGB 13.0 05/30/2014   HGB 12.0 04/15/2014   Lab Results  Component Value Date   WBC 6.2 11/17/2014   PLT 245 11/17/2014   No results found for: INR Lab Results  Component Value Date   NA 137 11/17/2014   K 4.6 11/17/2014   CL 102 11/17/2014   CO2 31 11/17/2014   BUN 16 11/17/2014   CREATININE 0.95 11/17/2014   GLUCOSE 124* 11/17/2014    Discharge Medications:     Medication List    STOP taking these medications        aspirin 81 MG tablet  Replaced by:  aspirin EC 325 MG tablet      TAKE these medications        aspirin EC 325 MG tablet  Take 1 tablet (325 mg total) by mouth 2 (two) times daily.     atorvastatin 20 MG tablet  Commonly known as:  LIPITOR  Take 1 tablet (20 mg total) by mouth daily.     FLUoxetine 20 MG capsule  Commonly known as:  PROZAC  Take 1 capsule (20 mg total) by mouth daily.     glipiZIDE 5 MG tablet  Commonly known as:  GLUCOTROL  TAKE 1 TABLET BY MOUTH EVERY NIGHT AT BEDTIME     Liraglutide 18 MG/3ML Sopn  Commonly known as:  VICTOZA  INJECT 1.2 MG INTO THE SKIN. Convert to 3 month supply with 4RF if allowed by insurance     losartan 25 MG tablet  Commonly known as:  COZAAR  Take 1 tablet (25 mg total) by mouth daily.     metFORMIN 1000 MG tablet  Commonly known as:  GLUCOPHAGE  TAKE 1 TABLET BY MOUTH TWICE DAILY WITH A MEAL     methocarbamol 750 MG tablet  Commonly known as:  ROBAXIN  Take 1 tablet (750 mg total) by mouth 2 (two) times daily as needed for muscle spasms.     omeprazole 20 MG capsule  Commonly known as:  PRILOSEC  Take 1 capsule (20 mg total) by mouth daily.     ONETOUCH VERIO test strip  Generic drug:  glucose blood  TEST BLOOD SUGARS TWICE DAILY     oxyCODONE 5 MG immediate release tablet  Commonly known as:  Oxy IR/ROXICODONE  Take 1-3 tablets (5-15 mg total) by mouth every 4 (four) hours as needed.     oxyCODONE-acetaminophen 5-325 MG per tablet  Commonly known as:  PERCOCET  Take 2 tablets by mouth every 4 (four) hours as needed.     traZODone 150 MG tablet  Commonly known as:  DESYREL  Take 1 tablet (150 mg total) by mouth at bedtime.        Diagnostic Studies: Dg Tibia/fibula Left  11/10/2014   CLINICAL DATA:  Left  lower extremity deformity after fall down steps today.  EXAM: LEFT TIBIA AND FIBULA - 2 VIEW  COMPARISON:  None.  FINDINGS: Severely angulated and comminuted fracture is seen involving the distal fibula. This appears to be closed and posttraumatic. There is noted posterior dislocation of the talus relative to the distal tibia. No soft tissue abnormality or radiopaque foreign body is noted.  IMPRESSION: Severely angulated and comminuted distal fibular fracture. This is the initial encounter.   Electronically Signed   By: Sabino Dick M.D.   On: 11/10/2014 08:37   Dg Ankle 2 Views Left  11/17/2014   CLINICAL DATA:  64 year old female with left ankle fracture treated with open reduction. Subsequent encounter.  EXAM: DG C-ARM 61-120 MIN; LEFT ANKLE - 2 VIEW  FLUOROSCOPY TIME:  45 seconds  COMPARISON:  11/10/2014.  FINDINGS: Three C-arm views submitted for review after surgery.  Sideplate and screws transfix comminuted left fibular fracture.  Two screws transfix posterior tibial fracture with better alignment of fracture fragments. This can be assessed on follow up.  IMPRESSION: Open reduction and internal fixation of left ankle fracture   Electronically Signed   By: Chauncey Cruel M.D.   On: 11/17/2014 18:36   Dg Ankle Complete Left  11/10/2014   CLINICAL DATA:  Ankle fractures.  Postreduction images.  EXAM:  LEFT ANKLE COMPLETE - 3+ VIEW  COMPARISON:  Plain films of the left ankle earlier this same day.  FINDINGS: The patient has now in a fiberglass splint. The tibiotalar joint has been reduced. Distal fibular and posterior malleolar fractures are again seen and demonstrate improved position and alignment.  IMPRESSION: Reduction of the tibiotalar joint with improved position alignment patient's fractures as above.   Electronically Signed   By: Inge Rise M.D.   On: 11/10/2014 10:38   Dg Ankle Complete Left  11/10/2014   CLINICAL DATA:  Status post fall down steps earlier this morning with obvious deformity   EXAM: LEFT ANKLE COMPLETE - 3+ VIEW  COMPARISON:  . Left tibia and fibula of today's date and left foot series of Mar 29, 2012  FINDINGS: The patient has sustained an acute fracture of the metadiaphysis of the distal fibula. There is angulation and comminution. There is a angulated and displaced posterior malleolar fracture. There is disruption of the ankle joint mortise. A small avulsion from the tip of the  medial malleolus is suspected. The talar dome is intact. The calcaneus and other tarsal bones are unremarkable. The metatarsal bases appear intact.  IMPRESSION: The patient has sustained a bimalleolar/trimalleolar fracture. There is comminution and angulation of the distal fibular fracture. There is angulation and displacement of the posterior malleolar fracture. The medial malleolar fracture consists of a small avulsion.   Electronically Signed   By: David  Martinique   On: 11/10/2014 08:37   Ct Ankle Left Wo Contrast  11/10/2014   CLINICAL DATA:  Fracture dislocation at the left ankle.  EXAM: CT OF THE LEFT ANKLE WITHOUT CONTRAST  TECHNIQUE: Multidetector CT imaging of the left ankle was performed according to the standard protocol. Multiplanar CT image reconstructions were also generated.  COMPARISON:  Radiographs dated 11/10/2014  FINDINGS: There is a comminuted fracture of the distal tibia involving the posterior and medial malleolar I with coronal and oblique coronal fracture planes. There is posterior subluxation of the foot with respect to the anterior major component of the distal tibia best seen in the sagittal plane. The posterior malleolar fracture involves approximately 1/3 of the articular surface of the distal tibia. Maximum distraction of the fracture is 7 mm at the lateral aspect of the fracture.  There is disruption of the anterior tibiofibular ligament with small avulsions of bone at that site.  There is a comminuted fracture of the distal shaft of the fibula centered 5.5 cm above the tip of the  lateral malleolus with minimal displacement and slight angulation. There is a tiny avulsion from the anterior lateral aspect of the tip of the fibula. I cannot fully identified the anterior talofibular ligament.  Coronal reconstructed images demonstrate widening of the lateral aspect of the tibiotalar joint. The talus is intact. Benign bone island of the medial aspect of the dome of the talus.  There is no tendon entrapment.  IMPRESSION: Comminuted fractures of the distal tibia and fibula as described. Posterior subluxation of the foot with respect to the major distal tibia component.   Electronically Signed   By: Rozetta Nunnery M.D.   On: 11/10/2014 10:55   Dg C-arm 61-120 Min  11/17/2014   CLINICAL DATA:  65 year old female with left ankle fracture treated with open reduction. Subsequent encounter.  EXAM: DG C-ARM 61-120 MIN; LEFT ANKLE - 2 VIEW  FLUOROSCOPY TIME:  45 seconds  COMPARISON:  11/10/2014.  FINDINGS: Three C-arm views submitted for review after surgery.  Sideplate and screws transfix comminuted left fibular fracture.  Two screws transfix posterior tibial fracture with better alignment of fracture fragments. This can be assessed on follow up.  IMPRESSION: Open reduction and internal fixation of left ankle fracture   Electronically Signed   By: Chauncey Cruel M.D.   On: 11/17/2014 18:36    Disposition: 01-Home or Self Care  Discharge Instructions    Call MD / Call 911    Complete by:  As directed   If you experience chest pain or shortness of breath, CALL 911 and be transported to the hospital emergency room.  If you develope a fever above 101.5 F, pus (white drainage) or increased drainage or redness at the wound, or calf pain, call your surgeon's office.     Constipation Prevention    Complete by:  As directed   Drink plenty of fluids.  Prune juice may be helpful.  You may use a stool softener, such as Colace (over the counter) 100 mg twice a day.  Use MiraLax (over the counter) for  constipation as needed.     Diet - low sodium heart healthy    Complete by:  As directed      Diet general    Complete by:  As directed      Driving restrictions    Complete by:  As directed   No driving while taking narcotic pain meds.     Increase activity slowly as tolerated    Complete by:  As directed      Non weight bearing    Complete by:  As directed            Follow-up Information    Follow up with Marianna Payment, MD In 2 weeks.   Specialty:  Orthopedic Surgery   Why:  For suture removal, For wound re-check   Contact information:   [address] [place] [address] [phone]        Signed: Marianna Payment 11/18/2014, 7:52 AM

## 2014-11-19 ENCOUNTER — Other Ambulatory Visit: Payer: Medicare Other

## 2014-11-24 ENCOUNTER — Ambulatory Visit: Payer: Federal, State, Local not specified - PPO | Admitting: Endocrinology

## 2014-11-24 ENCOUNTER — Telehealth: Payer: Self-pay | Admitting: Family Medicine

## 2014-11-24 NOTE — Telephone Encounter (Signed)
I called to check on her.  She is at home recovering from her ankle operation.  She will follow-up with her orthopedist next week.

## 2014-12-01 DIAGNOSIS — S82852D Displaced trimalleolar fracture of left lower leg, subsequent encounter for closed fracture with routine healing: Secondary | ICD-10-CM | POA: Diagnosis not present

## 2014-12-11 ENCOUNTER — Encounter (HOSPITAL_COMMUNITY): Payer: Self-pay | Admitting: Orthopaedic Surgery

## 2014-12-29 DIAGNOSIS — S82852D Displaced trimalleolar fracture of left lower leg, subsequent encounter for closed fracture with routine healing: Secondary | ICD-10-CM | POA: Diagnosis not present

## 2015-01-05 DIAGNOSIS — M25475 Effusion, left foot: Secondary | ICD-10-CM | POA: Diagnosis not present

## 2015-01-05 DIAGNOSIS — M25472 Effusion, left ankle: Secondary | ICD-10-CM | POA: Diagnosis not present

## 2015-01-05 DIAGNOSIS — M25572 Pain in left ankle and joints of left foot: Secondary | ICD-10-CM | POA: Diagnosis not present

## 2015-01-05 DIAGNOSIS — M25672 Stiffness of left ankle, not elsewhere classified: Secondary | ICD-10-CM | POA: Diagnosis not present

## 2015-01-07 DIAGNOSIS — M25472 Effusion, left ankle: Secondary | ICD-10-CM | POA: Diagnosis not present

## 2015-01-07 DIAGNOSIS — M25475 Effusion, left foot: Secondary | ICD-10-CM | POA: Diagnosis not present

## 2015-01-07 DIAGNOSIS — M25672 Stiffness of left ankle, not elsewhere classified: Secondary | ICD-10-CM | POA: Diagnosis not present

## 2015-01-07 DIAGNOSIS — M25572 Pain in left ankle and joints of left foot: Secondary | ICD-10-CM | POA: Diagnosis not present

## 2015-01-08 DIAGNOSIS — M25572 Pain in left ankle and joints of left foot: Secondary | ICD-10-CM | POA: Diagnosis not present

## 2015-01-08 DIAGNOSIS — M25672 Stiffness of left ankle, not elsewhere classified: Secondary | ICD-10-CM | POA: Diagnosis not present

## 2015-01-08 DIAGNOSIS — M25472 Effusion, left ankle: Secondary | ICD-10-CM | POA: Diagnosis not present

## 2015-01-08 DIAGNOSIS — M25475 Effusion, left foot: Secondary | ICD-10-CM | POA: Diagnosis not present

## 2015-01-12 DIAGNOSIS — M25472 Effusion, left ankle: Secondary | ICD-10-CM | POA: Diagnosis not present

## 2015-01-12 DIAGNOSIS — M25572 Pain in left ankle and joints of left foot: Secondary | ICD-10-CM | POA: Diagnosis not present

## 2015-01-12 DIAGNOSIS — M25475 Effusion, left foot: Secondary | ICD-10-CM | POA: Diagnosis not present

## 2015-01-12 DIAGNOSIS — M25672 Stiffness of left ankle, not elsewhere classified: Secondary | ICD-10-CM | POA: Diagnosis not present

## 2015-01-13 DIAGNOSIS — M25472 Effusion, left ankle: Secondary | ICD-10-CM | POA: Diagnosis not present

## 2015-01-13 DIAGNOSIS — M25572 Pain in left ankle and joints of left foot: Secondary | ICD-10-CM | POA: Diagnosis not present

## 2015-01-13 DIAGNOSIS — M25475 Effusion, left foot: Secondary | ICD-10-CM | POA: Diagnosis not present

## 2015-01-13 DIAGNOSIS — M25672 Stiffness of left ankle, not elsewhere classified: Secondary | ICD-10-CM | POA: Diagnosis not present

## 2015-01-15 DIAGNOSIS — M25672 Stiffness of left ankle, not elsewhere classified: Secondary | ICD-10-CM | POA: Diagnosis not present

## 2015-01-15 DIAGNOSIS — M25572 Pain in left ankle and joints of left foot: Secondary | ICD-10-CM | POA: Diagnosis not present

## 2015-01-15 DIAGNOSIS — M25475 Effusion, left foot: Secondary | ICD-10-CM | POA: Diagnosis not present

## 2015-01-15 DIAGNOSIS — M25472 Effusion, left ankle: Secondary | ICD-10-CM | POA: Diagnosis not present

## 2015-01-19 DIAGNOSIS — M25572 Pain in left ankle and joints of left foot: Secondary | ICD-10-CM | POA: Diagnosis not present

## 2015-01-19 DIAGNOSIS — M25672 Stiffness of left ankle, not elsewhere classified: Secondary | ICD-10-CM | POA: Diagnosis not present

## 2015-01-19 DIAGNOSIS — M25472 Effusion, left ankle: Secondary | ICD-10-CM | POA: Diagnosis not present

## 2015-01-19 DIAGNOSIS — M25475 Effusion, left foot: Secondary | ICD-10-CM | POA: Diagnosis not present

## 2015-01-20 DIAGNOSIS — M25672 Stiffness of left ankle, not elsewhere classified: Secondary | ICD-10-CM | POA: Diagnosis not present

## 2015-01-20 DIAGNOSIS — M25472 Effusion, left ankle: Secondary | ICD-10-CM | POA: Diagnosis not present

## 2015-01-20 DIAGNOSIS — M25572 Pain in left ankle and joints of left foot: Secondary | ICD-10-CM | POA: Diagnosis not present

## 2015-01-20 DIAGNOSIS — M25475 Effusion, left foot: Secondary | ICD-10-CM | POA: Diagnosis not present

## 2015-01-22 DIAGNOSIS — M25572 Pain in left ankle and joints of left foot: Secondary | ICD-10-CM | POA: Diagnosis not present

## 2015-01-22 DIAGNOSIS — M25672 Stiffness of left ankle, not elsewhere classified: Secondary | ICD-10-CM | POA: Diagnosis not present

## 2015-01-22 DIAGNOSIS — M25472 Effusion, left ankle: Secondary | ICD-10-CM | POA: Diagnosis not present

## 2015-01-22 DIAGNOSIS — M25475 Effusion, left foot: Secondary | ICD-10-CM | POA: Diagnosis not present

## 2015-01-26 ENCOUNTER — Other Ambulatory Visit: Payer: Self-pay | Admitting: Family Medicine

## 2015-01-26 DIAGNOSIS — M25672 Stiffness of left ankle, not elsewhere classified: Secondary | ICD-10-CM | POA: Diagnosis not present

## 2015-01-26 DIAGNOSIS — M25472 Effusion, left ankle: Secondary | ICD-10-CM | POA: Diagnosis not present

## 2015-01-26 DIAGNOSIS — M25572 Pain in left ankle and joints of left foot: Secondary | ICD-10-CM | POA: Diagnosis not present

## 2015-01-26 DIAGNOSIS — M25475 Effusion, left foot: Secondary | ICD-10-CM | POA: Diagnosis not present

## 2015-01-27 DIAGNOSIS — M25572 Pain in left ankle and joints of left foot: Secondary | ICD-10-CM | POA: Diagnosis not present

## 2015-01-27 DIAGNOSIS — M25472 Effusion, left ankle: Secondary | ICD-10-CM | POA: Diagnosis not present

## 2015-01-27 DIAGNOSIS — M25475 Effusion, left foot: Secondary | ICD-10-CM | POA: Diagnosis not present

## 2015-01-27 DIAGNOSIS — M25672 Stiffness of left ankle, not elsewhere classified: Secondary | ICD-10-CM | POA: Diagnosis not present

## 2015-02-01 ENCOUNTER — Other Ambulatory Visit: Payer: Self-pay | Admitting: Endocrinology

## 2015-02-03 DIAGNOSIS — M25672 Stiffness of left ankle, not elsewhere classified: Secondary | ICD-10-CM | POA: Diagnosis not present

## 2015-02-03 DIAGNOSIS — M25572 Pain in left ankle and joints of left foot: Secondary | ICD-10-CM | POA: Diagnosis not present

## 2015-02-03 DIAGNOSIS — M25475 Effusion, left foot: Secondary | ICD-10-CM | POA: Diagnosis not present

## 2015-02-03 DIAGNOSIS — M25472 Effusion, left ankle: Secondary | ICD-10-CM | POA: Diagnosis not present

## 2015-02-05 DIAGNOSIS — M25672 Stiffness of left ankle, not elsewhere classified: Secondary | ICD-10-CM | POA: Diagnosis not present

## 2015-02-05 DIAGNOSIS — M25472 Effusion, left ankle: Secondary | ICD-10-CM | POA: Diagnosis not present

## 2015-02-05 DIAGNOSIS — M25572 Pain in left ankle and joints of left foot: Secondary | ICD-10-CM | POA: Diagnosis not present

## 2015-02-05 DIAGNOSIS — M25475 Effusion, left foot: Secondary | ICD-10-CM | POA: Diagnosis not present

## 2015-02-09 DIAGNOSIS — S82852D Displaced trimalleolar fracture of left lower leg, subsequent encounter for closed fracture with routine healing: Secondary | ICD-10-CM | POA: Diagnosis not present

## 2015-02-10 DIAGNOSIS — M25672 Stiffness of left ankle, not elsewhere classified: Secondary | ICD-10-CM | POA: Diagnosis not present

## 2015-02-10 DIAGNOSIS — M25475 Effusion, left foot: Secondary | ICD-10-CM | POA: Diagnosis not present

## 2015-02-10 DIAGNOSIS — M25472 Effusion, left ankle: Secondary | ICD-10-CM | POA: Diagnosis not present

## 2015-02-10 DIAGNOSIS — M25572 Pain in left ankle and joints of left foot: Secondary | ICD-10-CM | POA: Diagnosis not present

## 2015-02-12 DIAGNOSIS — M25672 Stiffness of left ankle, not elsewhere classified: Secondary | ICD-10-CM | POA: Diagnosis not present

## 2015-02-12 DIAGNOSIS — M25472 Effusion, left ankle: Secondary | ICD-10-CM | POA: Diagnosis not present

## 2015-02-12 DIAGNOSIS — M25572 Pain in left ankle and joints of left foot: Secondary | ICD-10-CM | POA: Diagnosis not present

## 2015-02-12 DIAGNOSIS — M25475 Effusion, left foot: Secondary | ICD-10-CM | POA: Diagnosis not present

## 2015-02-17 ENCOUNTER — Other Ambulatory Visit (INDEPENDENT_AMBULATORY_CARE_PROVIDER_SITE_OTHER): Payer: Medicare Other

## 2015-02-17 DIAGNOSIS — E119 Type 2 diabetes mellitus without complications: Secondary | ICD-10-CM | POA: Diagnosis not present

## 2015-02-17 LAB — COMPREHENSIVE METABOLIC PANEL
ALBUMIN: 3.9 g/dL (ref 3.5–5.2)
ALK PHOS: 75 U/L (ref 39–117)
ALT: 8 U/L (ref 0–35)
AST: 11 U/L (ref 0–37)
BILIRUBIN TOTAL: 0.3 mg/dL (ref 0.2–1.2)
BUN: 18 mg/dL (ref 6–23)
CO2: 31 mEq/L (ref 19–32)
Calcium: 9.2 mg/dL (ref 8.4–10.5)
Chloride: 102 mEq/L (ref 96–112)
Creatinine, Ser: 1.04 mg/dL (ref 0.40–1.20)
GFR: 56.44 mL/min — ABNORMAL LOW (ref >60.00)
GLUCOSE: 132 mg/dL — AB (ref 70–99)
POTASSIUM: 4.1 meq/L (ref 3.5–5.1)
SODIUM: 137 meq/L (ref 135–145)
Total Protein: 6.6 g/dL (ref 6.0–8.3)

## 2015-02-17 LAB — HEMOGLOBIN A1C: HEMOGLOBIN A1C: 5.9 % (ref 4.6–6.5)

## 2015-02-18 DIAGNOSIS — M25475 Effusion, left foot: Secondary | ICD-10-CM | POA: Diagnosis not present

## 2015-02-18 DIAGNOSIS — M25672 Stiffness of left ankle, not elsewhere classified: Secondary | ICD-10-CM | POA: Diagnosis not present

## 2015-02-18 DIAGNOSIS — M25472 Effusion, left ankle: Secondary | ICD-10-CM | POA: Diagnosis not present

## 2015-02-18 DIAGNOSIS — M25572 Pain in left ankle and joints of left foot: Secondary | ICD-10-CM | POA: Diagnosis not present

## 2015-02-20 ENCOUNTER — Ambulatory Visit (INDEPENDENT_AMBULATORY_CARE_PROVIDER_SITE_OTHER): Payer: Medicare Other | Admitting: Endocrinology

## 2015-02-20 ENCOUNTER — Encounter: Payer: Self-pay | Admitting: Endocrinology

## 2015-02-20 VITALS — BP 135/76 | HR 100 | Temp 97.9°F | Resp 14 | Ht 65.5 in | Wt 144.6 lb

## 2015-02-20 DIAGNOSIS — M25572 Pain in left ankle and joints of left foot: Secondary | ICD-10-CM | POA: Diagnosis not present

## 2015-02-20 DIAGNOSIS — M25475 Effusion, left foot: Secondary | ICD-10-CM | POA: Diagnosis not present

## 2015-02-20 DIAGNOSIS — E785 Hyperlipidemia, unspecified: Secondary | ICD-10-CM

## 2015-02-20 DIAGNOSIS — E119 Type 2 diabetes mellitus without complications: Secondary | ICD-10-CM

## 2015-02-20 DIAGNOSIS — M25672 Stiffness of left ankle, not elsewhere classified: Secondary | ICD-10-CM | POA: Diagnosis not present

## 2015-02-20 DIAGNOSIS — M25472 Effusion, left ankle: Secondary | ICD-10-CM | POA: Diagnosis not present

## 2015-02-20 NOTE — Patient Instructions (Addendum)
Metformin 1/2 twice daily  Glipizide 1/2 daily at bedtime

## 2015-02-20 NOTE — Progress Notes (Signed)
Patient ID: Abigail [name], female   DOB: [birth year], 64 y.o.   MRN: [id number]   Reason for Appointment : Followup for Type 2 Diabetes  History of Present Illness          Diagnosis: Type 2 diabetes mellitus, date of diagnosis: 2004 ?        Past history: She initially had nonspecific symptoms of shakiness at diagnosis and had only mild hyperglycemia. Subsequently was started on metformin low doses for treatment The dose of metformin was gradually increased and she thinks she has been taking 1000 mg twice a day for at least a year. She did have nausea with increasing the dose; however this is better now although not resolved. She probably tried Byetta several years ago but she believes it did not help her control. She has been managed with metformin alone for the last year and a half but blood sugars have been progressively higher.  Blood sugars prior to her initial consultation were mostly over 200, up to 315 and her A1c was 10.3%  Recent history:     She has not been seen in follow-up since 07/2014 ; was seen by PCP in December and subsequently had ankle fracture Her blood sugars had improved with adding Victoza  Since 9/14.  She is using 1.2 mg and is still doing well with this  Her weight had gone down initially and also recently has lost weight However has not lost any more weight recently Her fasting readings  Which were previously high did improve with adding glipizide at night and is taking 5 mg now  She is sometimes forgetting her metformin in the evening and is mostly taking one tablet in the morning now   She has fairly stable blood sugars although somewhat higher at times after her  Breakfast based on what she is eating Her A1c continues to be upper normal  Side effects from medications: She did have nausea with Amaryl  Glucose monitoring:  one or more times a day      Glucometer:  One Touch Verio Blood Glucose readings from download recently:   PRE-MEAL  Breakfast Lunch Dinner Bedtime Overall  Glucose range:  82- 136       median:      116   POST-MEAL PC Breakfast PC Lunch PC Dinner  Glucose range:   107-212  101 , 117  97-211  Mean/median:       Hypoglycemia: None      Self-care: The diet that the patient has been following YB:OFBPZ to limit fat intake, portions and sweets. Will have fruit for snacks.  Eating out 2-3 times a week, rarely fast food    Exercise: Walking  very little recently, still recovering from ankle fracture        Dietician visit: Most recent: Years ago. Has seen nurse educator.          Retinal exam:  annual She has had a weight range of 168-185 in the last few years  Wt Readings from Last 3 Encounters:  02/20/15 144 lb 9.6 oz (65.59 kg)  11/10/14 152 lb (68.947 kg)  11/03/14 152 lb (68.947 kg)  . LABS:  Lab Results  Component Value Date   HGBA1C 5.9 02/17/2015   HGBA1C 6.1* 11/03/2014   HGBA1C 6.2 07/21/2014   Lab Results  Component Value Date   MICROALBUR 3.4* 04/15/2014   LDLCALC 72 11/03/2014   CREATININE 1.04 02/17/2015       Medication List  This list is accurate as of: 02/20/15 11:59 PM.  Always use your most recent med list.               aspirin EC 325 MG tablet  Take 1 tablet (325 mg total) by mouth 2 (two) times daily.     atorvastatin 20 MG tablet  Commonly known as:  LIPITOR  Take 1 tablet (20 mg total) by mouth daily.     FLUoxetine 20 MG capsule  Commonly known as:  PROZAC  Take 1 capsule (20 mg total) by mouth daily.     glipiZIDE 5 MG tablet  Commonly known as:  GLUCOTROL  TAKE 1 TABLET BY MOUTH EVERY NIGHT AT BEDTIME     Liraglutide 18 MG/3ML Sopn  Commonly known as:  VICTOZA  INJECT 1.2 MG INTO THE SKIN. Convert to 3 month supply with 4RF if allowed by insurance     LORazepam 0.5 MG tablet  Commonly known as:  ATIVAN     losartan 25 MG tablet  Commonly known as:  COZAAR  Take 1 tablet (25 mg total) by mouth daily.     metFORMIN 1000 MG tablet    Commonly known as:  GLUCOPHAGE  TAKE 1 TABLET BY MOUTH TWICE DAILY WITH A MEAL     methocarbamol 750 MG tablet  Commonly known as:  ROBAXIN  Take 1 tablet (750 mg total) by mouth 2 (two) times daily as needed for muscle spasms.     omeprazole 20 MG capsule  Commonly known as:  PRILOSEC  Take 1 capsule (20 mg total) by mouth daily.     ONETOUCH VERIO test strip  Generic drug:  glucose blood  TEST BLOOD SUGARS TWICE DAILY     oxyCODONE 5 MG immediate release tablet  Commonly known as:  Oxy IR/ROXICODONE  Take 1-3 tablets (5-15 mg total) by mouth every 4 (four) hours as needed.     oxyCODONE-acetaminophen 5-325 MG per tablet  Commonly known as:  PERCOCET  Take 2 tablets by mouth every 4 (four) hours as needed.     traZODone 150 MG tablet  Commonly known as:  DESYREL  TAKE 1 TABLET AT BEDTIME.        Allergies:  Allergies  Allergen Reactions  . Amaryl [Glimepiride] Nausea Only  . Cephalexin Hives  . Sulfa Antibiotics Other (See Comments)    Unknown by pt    Past Medical History  Diagnosis Date  . Hypertension   . Hyperlipidemia   . Anxiety   . GERD (gastroesophageal reflux disease)   . Diabetes mellitus     Type 2  . Arthritis     Past Surgical History  Procedure Laterality Date  . Ovarian cyst removal    . Hammer toe surgery    . Colonoscopy    . Orif ankle fracture Left 11/17/2014    Procedure: OPEN REDUCTION INTERNAL FIXATION (ORIF) LEFT TRIMALLEOLAR ANKLE FRACTURE;  Surgeon: Marianna Payment, MD;  Location: [facility];  Service: Orthopedics;  Laterality: Left;    Family History  Problem Relation Age of Onset  . Diabetes Mother   . Diabetes Father   . Heart disease Father 61    AMI x 2; first AMI age 55  . Heart disease Brother     defibrillator; CHF    Social History:  reports that she has never smoked. She has never used smokeless tobacco. She reports that she drinks about 0.6 oz of alcohol per week. She reports that she does not  use illicit  drugs.    Review of Systems       Lipids:  on treatment with Lipitor for years, LDL is  excellent    Lab Results  Component Value Date   CHOL 157 11/03/2014   HDL 52 11/03/2014   LDLCALC 72 11/03/2014   TRIG 165* 11/03/2014   CHOLHDL 3.0 11/03/2014       The blood pressure has been treated with 25 mg Cozaar for "kidney protection".  She has periodic high readings, mostly in the office      No history of Numbness, tingling or burning in her feet. May have a little numb feeling after sitting for long   LABS:  Appointment on 02/17/2015  Component Date Value Ref Range Status  . Hgb A1c MFr Bld 02/17/2015 5.9  4.6 - 6.5 % Final   Glycemic Control Guidelines for People with Diabetes:Non Diabetic:  <6%Goal of Therapy: <7%Additional Action Suggested:  >8%   . Sodium 02/17/2015 137  135 - 145 mEq/L Final  . Potassium 02/17/2015 4.1  3.5 - 5.1 mEq/L Final  . Chloride 02/17/2015 102  96 - 112 mEq/L Final  . CO2 02/17/2015 31  19 - 32 mEq/L Final  . Glucose, Bld 02/17/2015 132* 70 - 99 mg/dL Final  . BUN 02/17/2015 18  6 - 23 mg/dL Final  . Creatinine, Ser 02/17/2015 1.04  0.40 - 1.20 mg/dL Final  . Total Bilirubin 02/17/2015 0.3  0.2 - 1.2 mg/dL Final  . Alkaline Phosphatase 02/17/2015 75  39 - 117 U/L Final  . AST 02/17/2015 11  0 - 37 U/L Final  . ALT 02/17/2015 8  0 - 35 U/L Final  . Total Protein 02/17/2015 6.6  6.0 - 8.3 g/dL Final  . Albumin 02/17/2015 3.9  3.5 - 5.2 g/dL Final  . Calcium 02/17/2015 9.2  8.4 - 10.5 mg/dL Final  . GFR 02/17/2015 56.44* >60.00 mL/min Final    Physical Examination:  BP 135/76 mmHg  Pulse 100  Temp(Src) 97.9 F (36.6 C)  Resp 14  Ht 5' 5.5" (1.664 m)  Wt 144 lb 9.6 oz (65.59 kg)  BMI 23.69 kg/m2  SpO2 97%     ASSESSMENT/PLAN:  Diabetes type 2, uncontrolled   Her diabetes is continuing to be well controlled with her regimen of metformin, Victoza and  5 mg glipizide at bedtime Overall has been fairly compliant with diet    Only  recently has started back on her walking because of recent ankle fracture Has only sporadic high readings postprandially depending on her carbohydrate intake However A1c is continuing to be upper normal    Since her fasting readings are low normal she can try reducing her glipizide to half tablet   Also since she has lost more weight she can try reducing her metformin to half tablet twice a day , currently not able to remember taking the evening tablet consistently  She needs to have some protein with breakfast consistently  Encouraged her to restart walking as tolerated  Patient Instructions  Metformin 1/2 twice daily  Glipizide 1/2 daily at bedtime     Desaray Marschner 02/23/2015, 8:59 AM

## 2015-02-23 DIAGNOSIS — M25475 Effusion, left foot: Secondary | ICD-10-CM | POA: Diagnosis not present

## 2015-02-23 DIAGNOSIS — M25672 Stiffness of left ankle, not elsewhere classified: Secondary | ICD-10-CM | POA: Diagnosis not present

## 2015-02-23 DIAGNOSIS — M25472 Effusion, left ankle: Secondary | ICD-10-CM | POA: Diagnosis not present

## 2015-02-23 DIAGNOSIS — M25572 Pain in left ankle and joints of left foot: Secondary | ICD-10-CM | POA: Diagnosis not present

## 2015-02-25 DIAGNOSIS — M25472 Effusion, left ankle: Secondary | ICD-10-CM | POA: Diagnosis not present

## 2015-02-25 DIAGNOSIS — M25475 Effusion, left foot: Secondary | ICD-10-CM | POA: Diagnosis not present

## 2015-02-25 DIAGNOSIS — M25572 Pain in left ankle and joints of left foot: Secondary | ICD-10-CM | POA: Diagnosis not present

## 2015-02-25 DIAGNOSIS — M25672 Stiffness of left ankle, not elsewhere classified: Secondary | ICD-10-CM | POA: Diagnosis not present

## 2015-03-04 DIAGNOSIS — M25672 Stiffness of left ankle, not elsewhere classified: Secondary | ICD-10-CM | POA: Diagnosis not present

## 2015-03-04 DIAGNOSIS — M25572 Pain in left ankle and joints of left foot: Secondary | ICD-10-CM | POA: Diagnosis not present

## 2015-03-04 DIAGNOSIS — M25472 Effusion, left ankle: Secondary | ICD-10-CM | POA: Diagnosis not present

## 2015-03-04 DIAGNOSIS — M25475 Effusion, left foot: Secondary | ICD-10-CM | POA: Diagnosis not present

## 2015-03-06 DIAGNOSIS — M25472 Effusion, left ankle: Secondary | ICD-10-CM | POA: Diagnosis not present

## 2015-03-06 DIAGNOSIS — M25672 Stiffness of left ankle, not elsewhere classified: Secondary | ICD-10-CM | POA: Diagnosis not present

## 2015-03-06 DIAGNOSIS — M25572 Pain in left ankle and joints of left foot: Secondary | ICD-10-CM | POA: Diagnosis not present

## 2015-03-06 DIAGNOSIS — M25475 Effusion, left foot: Secondary | ICD-10-CM | POA: Diagnosis not present

## 2015-03-08 ENCOUNTER — Other Ambulatory Visit: Payer: Self-pay | Admitting: Family Medicine

## 2015-03-23 ENCOUNTER — Other Ambulatory Visit: Payer: Self-pay | Admitting: Family Medicine

## 2015-04-02 ENCOUNTER — Other Ambulatory Visit: Payer: Self-pay | Admitting: Endocrinology

## 2015-04-02 ENCOUNTER — Other Ambulatory Visit: Payer: Self-pay | Admitting: Family Medicine

## 2015-04-11 ENCOUNTER — Other Ambulatory Visit: Payer: Self-pay | Admitting: Family Medicine

## 2015-05-01 ENCOUNTER — Encounter: Payer: Self-pay | Admitting: *Deleted

## 2015-05-01 ENCOUNTER — Other Ambulatory Visit: Payer: Self-pay | Admitting: Family Medicine

## 2015-05-04 ENCOUNTER — Ambulatory Visit (INDEPENDENT_AMBULATORY_CARE_PROVIDER_SITE_OTHER): Payer: Medicare Other | Admitting: Family Medicine

## 2015-05-04 VITALS — BP 126/78 | HR 88 | Temp 97.9°F | Resp 16 | Ht 65.25 in | Wt 142.4 lb

## 2015-05-04 DIAGNOSIS — K219 Gastro-esophageal reflux disease without esophagitis: Secondary | ICD-10-CM

## 2015-05-04 DIAGNOSIS — F329 Major depressive disorder, single episode, unspecified: Secondary | ICD-10-CM | POA: Diagnosis not present

## 2015-05-04 DIAGNOSIS — E785 Hyperlipidemia, unspecified: Secondary | ICD-10-CM

## 2015-05-04 DIAGNOSIS — I10 Essential (primary) hypertension: Secondary | ICD-10-CM

## 2015-05-04 DIAGNOSIS — R1011 Right upper quadrant pain: Secondary | ICD-10-CM

## 2015-05-04 DIAGNOSIS — R197 Diarrhea, unspecified: Secondary | ICD-10-CM

## 2015-05-04 DIAGNOSIS — F32A Depression, unspecified: Secondary | ICD-10-CM

## 2015-05-04 LAB — COMPREHENSIVE METABOLIC PANEL
ALBUMIN: 4.2 g/dL (ref 3.5–5.2)
ALT: 8 U/L (ref 0–35)
AST: 12 U/L (ref 0–37)
Alkaline Phosphatase: 87 U/L (ref 39–117)
BUN: 14 mg/dL (ref 6–23)
CALCIUM: 9.3 mg/dL (ref 8.4–10.5)
CO2: 30 mEq/L (ref 19–32)
Chloride: 100 mEq/L (ref 96–112)
Creat: 1.02 mg/dL (ref 0.50–1.10)
GLUCOSE: 101 mg/dL — AB (ref 70–99)
POTASSIUM: 4.3 meq/L (ref 3.5–5.3)
Sodium: 139 mEq/L (ref 135–145)
TOTAL PROTEIN: 6.8 g/dL (ref 6.0–8.3)
Total Bilirubin: 0.4 mg/dL (ref 0.2–1.2)

## 2015-05-04 LAB — POCT CBC
Granulocyte percent: 62.7 %G (ref 37–80)
HCT, POC: 39.2 % (ref 37.7–47.9)
Hemoglobin: 12.5 g/dL (ref 12.2–16.2)
Lymph, poc: 2 (ref 0.6–3.4)
MCH, POC: 26 pg — AB (ref 27–31.2)
MCHC: 31.9 g/dL (ref 31.8–35.4)
MCV: 81.4 fL (ref 80–97)
MID (CBC): 0.2 (ref 0–0.9)
MPV: 5.6 fL (ref 0–99.8)
POC GRANULOCYTE: 3.8 (ref 2–6.9)
POC LYMPH %: 33.2 % (ref 10–50)
POC MID %: 4.1 %M (ref 0–12)
Platelet Count, POC: 255 10*3/uL (ref 142–424)
RBC: 4.81 M/uL (ref 4.04–5.48)
RDW, POC: 14.4 %
WBC: 6 10*3/uL (ref 4.6–10.2)

## 2015-05-04 MED ORDER — OMEPRAZOLE 20 MG PO CPDR: 20.0000 mg | DELAYED_RELEASE_CAPSULE | Freq: Every day | ORAL | Status: DC

## 2015-05-04 MED ORDER — LOSARTAN POTASSIUM 25 MG PO TABS: 25.0000 mg | ORAL_TABLET | Freq: Every day | ORAL | Status: DC

## 2015-05-04 MED ORDER — FLUOXETINE HCL 20 MG PO CAPS: 20.0000 mg | ORAL_CAPSULE | Freq: Every day | ORAL | Status: DC

## 2015-05-04 MED ORDER — TRAZODONE HCL 150 MG PO TABS: 150.0000 mg | ORAL_TABLET | Freq: Every day | ORAL | Status: DC

## 2015-05-04 MED ORDER — ATORVASTATIN CALCIUM 20 MG PO TABS: 20.0000 mg | ORAL_TABLET | Freq: Every day | ORAL | Status: DC

## 2015-05-04 NOTE — Progress Notes (Signed)
[facility] [address], [place] [address] [phone]  Date:  05/04/2015   Name:  Abigail [name]   DOB:  Mar 20, 1951   MRN:  [id number]  PCP:  Lamar Blinks, MD    Chief Complaint: Medication Refill; Abdominal Pain; Diarrhea; and Chills   History of Present Illness:  Abigail [name] is a 64 y.o. very pleasant female patient who presents with the following:  Here today for follow-up of her medications and a couple of other concerns- needs RF of Atorvastatin, Losartan, Prozac, Omeprazole, trazodone  About 5 days ago she noted onset of diarrhea.  She thought she was getting over it, but she then continued to have stool urgency.  Sx have waxed and waned.  Yesterday she noted some pain in her RUQ and burping. She is a little tender in this area now- inmproved.  No history of gallbladder problems No vomiting, but she does have some nausea.   She has been following a light diet- BRAT foots and ginger ale yesterday and today No fever No blood or mucus in her stools   Patient Active Problem List   Diagnosis Date Noted  . Trimalleolar fracture of ankle, closed 11/17/2014  . Other and unspecified hyperlipidemia 07/24/2013  . Chest pain 10/23/2012  . Type II or unspecified type diabetes mellitus without mention of complication, uncontrolled 10/08/2012  . Anxiety 10/08/2012    Past Medical History  Diagnosis Date  . Hypertension   . Hyperlipidemia   . Anxiety   . GERD (gastroesophageal reflux disease)   . Diabetes mellitus     Type 2  . Arthritis     Past Surgical History  Procedure Laterality Date  . Ovarian cyst removal    . Hammer toe surgery    . Colonoscopy    . Orif ankle fracture Left 11/17/2014    Procedure: OPEN REDUCTION INTERNAL FIXATION (ORIF) LEFT TRIMALLEOLAR ANKLE FRACTURE;  Surgeon: Marianna Payment, MD;  Location: [facility];  Service: Orthopedics;  Laterality: Left;    History  Substance Use Topics  . Smoking status:  Never Smoker   . Smokeless tobacco: Never Used  . Alcohol Use: 0.6 oz/week    1 Glasses of wine per week    Family History  Problem Relation Age of Onset  . Diabetes Mother   . Diabetes Father   . Heart disease Father 75    AMI x 2; first AMI age 68  . Heart disease Brother     defibrillator; CHF    Allergies  Allergen Reactions  . Amaryl [Glimepiride] Nausea Only  . Cephalexin Hives  . Sulfa Antibiotics Other (See Comments)    Unknown by pt    Medication list has been reviewed and updated.  Current Outpatient Prescriptions on File Prior to Visit  Medication Sig Dispense Refill  . aspirin EC 325 MG tablet Take 1 tablet (325 mg total) by mouth 2 (two) times daily. 84 tablet 0  . FLUoxetine (PROZAC) 20 MG capsule Take 1 capsule (20 mg total) by mouth daily. PATIENT NEEDS OFFICE VISIT FOR ADDITIONAL REFILLS 30 capsule 0  . glipiZIDE (GLUCOTROL) 5 MG tablet TAKE 1 TABLET BY MOUTH EVERY NIGHT AT BEDTIME 30 tablet 5  . Liraglutide (VICTOZA) 18 MG/3ML SOPN INJECT 1.2 MG INTO THE SKIN. Convert to 3 month supply with 4RF if allowed by insurance 6 mL 3  . losartan (COZAAR) 25 MG tablet Take 1 tablet (25 mg total) by mouth daily. PATIENT NEEDS OFFICE VISIT  FOR ADDITIONAL REFILLS 90 tablet 0  . metFORMIN (GLUCOPHAGE) 1000 MG tablet TAKE 1 TABLET BY MOUTH TWICE DAILY WITH A MEAL 180 tablet 3  . omeprazole (PRILOSEC) 20 MG capsule TAKE 1 CAPSULE DAILY. 90 capsule 0  . ONETOUCH VERIO test strip TEST BLOOD SUGARS TWICE DAILY 100 each 3  . traZODone (DESYREL) 150 MG tablet TAKE 1 TABLET BY MOUTH AT BEDTIME 90 tablet 0  . atorvastatin (LIPITOR) 20 MG tablet Take 1 tablet (20 mg total) by mouth daily. 90 tablet 3  . LORazepam (ATIVAN) 0.5 MG tablet     . methocarbamol (ROBAXIN) 750 MG tablet Take 1 tablet (750 mg total) by mouth 2 (two) times daily as needed for muscle spasms. (Patient not taking: Reported on 05/04/2015) 60 tablet 0  . oxyCODONE (OXY IR/ROXICODONE) 5 MG immediate release tablet  Take 1-3 tablets (5-15 mg total) by mouth every 4 (four) hours as needed. (Patient not taking: Reported on 05/04/2015) 90 tablet 0  . oxyCODONE-acetaminophen (PERCOCET) 5-325 MG per tablet Take 2 tablets by mouth every 4 (four) hours as needed. (Patient not taking: Reported on 05/04/2015) 20 tablet 0   No current facility-administered medications on file prior to visit.    Review of Systems:  As per HPI- otherwise negative.  Wt Readings from Last 3 Encounters:  05/04/15 142 lb 6.4 oz (64.592 kg)  02/20/15 144 lb 9.6 oz (65.59 kg)  11/17/14 154 lb 1.6 oz (69.9 kg)     Physical Examination: Filed Vitals:   05/04/15 1336  BP: 126/78  Pulse: 88  Temp: 97.9 F (36.6 C)  Resp: 16   Filed Vitals:   05/04/15 1336  Height: 5' 5.25" (1.657 m)  Weight: 142 lb 6.4 oz (64.592 kg)   Body mass index is 23.53 kg/(m^2). Ideal Body Weight: Weight in (lb) to have BMI = 25: 151.1  GEN: WDWN, NAD, Non-toxic, A & O x 3, looks well HEENT: Atraumatic, Normocephalic. Neck supple. No masses, No LAD. Ears and Nose: No external deformity. CV: RRR, No M/G/R. No JVD. No thrill. No extra heart sounds. PULM: CTA B, no wheezes, crackles, rhonchi. No retractions. No resp. distress. No accessory muscle use. ABD: S, ND, +BS. No rebound. No HSM.  She has minimal RUQ tenderness, negative murphy's EXTR: No c/c/e NEURO Normal gait.  PSYCH: Normally interactive. Conversant. Not depressed or anxious appearing.  Calm demeanor.   Results for orders placed or performed in visit on 05/04/15  POCT CBC  Result Value Ref Range   WBC 6.0 4.6 - 10.2 K/uL   Lymph, poc 2.0 0.6 - 3.4   POC LYMPH PERCENT 33.2 10 - 50 %L   MID (cbc) 0.2 0 - 0.9   POC MID % 4.1 0 - 12 %M   POC Granulocyte 3.8 2 - 6.9   Granulocyte percent 62.7 37 - 80 %G   RBC 4.81 4.04 - 5.48 M/uL   Hemoglobin 12.5 12.2 - 16.2 g/dL   HCT, POC 39.2 37.7 - 47.9 %   MCV 81.4 80 - 97 fL   MCH, POC 26.0 (A) 27 - 31.2 pg   MCHC 31.9 31.8 - 35.4 g/dL    RDW, POC 14.4 %   Platelet Count, POC 255 142 - 424 K/uL   MPV 5.6 0 - 99.8 fL   Assessment and Plan: Diarrhea - Plan: POCT CBC, Comprehensive metabolic panel  Essential hypertension - Plan: losartan (COZAAR) 25 MG tablet  Depression - Plan: FLUoxetine (PROZAC) 20 MG capsule, traZODone (DESYREL) 150  MG tablet  Dyslipidemia - Plan: atorvastatin (LIPITOR) 20 MG tablet  Gastroesophageal reflux disease without esophagitis - Plan: omeprazole (PRILOSEC) 20 MG capsule  RUQ pain  Here today for refills- done as above- and also to discuss diarrhea and some abd discomfort.  Her CBC is reassuring.  Await CMP, but as she is getting better will avoid other intervention for now.  If getting worse or not continuing to improve she will let me know.  Continue BRAT diet and advance as tolerated   Signed Lamar Blinks, MD  Call with labs- she does not use mychart

## 2015-05-04 NOTE — Patient Instructions (Signed)
Good to see you today.  I did your refills as you asked  Let me know if you are not continuing to get better- the BRAT diet is a good idea I will be in touch with the rest of your labs   Please recheck with me in about 6 months

## 2015-05-08 ENCOUNTER — Ambulatory Visit (INDEPENDENT_AMBULATORY_CARE_PROVIDER_SITE_OTHER): Payer: Medicare Other | Admitting: Family Medicine

## 2015-05-08 ENCOUNTER — Telehealth: Payer: Self-pay | Admitting: *Deleted

## 2015-05-08 ENCOUNTER — Ambulatory Visit (HOSPITAL_BASED_OUTPATIENT_CLINIC_OR_DEPARTMENT_OTHER)
Admission: RE | Admit: 2015-05-08 | Discharge: 2015-05-08 | Disposition: A | Discharge status: Home / Self Care | Payer: Medicare Other | Source: Ambulatory Visit | Attending: Family Medicine | Admitting: Family Medicine

## 2015-05-08 VITALS — BP 116/70 | HR 107 | Temp 98.7°F | Resp 18 | Ht 65.0 in | Wt 139.0 lb

## 2015-05-08 DIAGNOSIS — N281 Cyst of kidney, acquired: Secondary | ICD-10-CM | POA: Diagnosis not present

## 2015-05-08 DIAGNOSIS — R1011 Right upper quadrant pain: Secondary | ICD-10-CM

## 2015-05-08 DIAGNOSIS — K7689 Other specified diseases of liver: Secondary | ICD-10-CM | POA: Diagnosis not present

## 2015-05-08 DIAGNOSIS — K8020 Calculus of gallbladder without cholecystitis without obstruction: Secondary | ICD-10-CM | POA: Insufficient documentation

## 2015-05-08 DIAGNOSIS — N390 Urinary tract infection, site not specified: Secondary | ICD-10-CM | POA: Diagnosis not present

## 2015-05-08 LAB — POCT CBC
Granulocyte percent: 62.5 %G (ref 37–80)
HCT, POC: 39.5 % (ref 37.7–47.9)
Hemoglobin: 13.2 g/dL (ref 12.2–16.2)
LYMPH, POC: 2.2 (ref 0.6–3.4)
MCH, POC: 27 pg (ref 27–31.2)
MCHC: 33.3 g/dL (ref 31.8–35.4)
MCV: 81 fL (ref 80–97)
MID (cbc): 0.5 (ref 0–0.9)
MPV: 5.6 fL (ref 0–99.8)
POC Granulocyte: 4.6 (ref 2–6.9)
POC LYMPH PERCENT: 30.3 %L (ref 10–50)
POC MID %: 7.2 %M (ref 0–12)
Platelet Count, POC: 277 10*3/uL (ref 142–424)
RBC: 4.88 M/uL (ref 4.04–5.48)
RDW, POC: 13.8 %
WBC: 7.3 10*3/uL (ref 4.6–10.2)

## 2015-05-08 LAB — POCT URINALYSIS DIPSTICK
Glucose, UA: NEGATIVE
Ketones, UA: NEGATIVE
Leukocytes, UA: NEGATIVE
Nitrite, UA: NEGATIVE
PH UA: 5.5
Protein, UA: 100
Spec Grav, UA: >=1.03
UROBILINOGEN UA: 0.2

## 2015-05-08 LAB — POCT UA - MICROSCOPIC ONLY
Casts, Ur, LPF, POC: NEGATIVE
Crystals, Ur, HPF, POC: NEGATIVE
MUCUS UA: POSITIVE
Yeast, UA: NEGATIVE

## 2015-05-08 MED ORDER — CIPROFLOXACIN HCL 500 MG PO TABS: 500.0000 mg | ORAL_TABLET | Freq: Two times a day (BID) | ORAL | Status: DC

## 2015-05-08 NOTE — Telephone Encounter (Signed)
Faxed most recent mammogram report request to [facility] - Dr. Christen Butter office.Marland KitchenMarland Kitchen

## 2015-05-08 NOTE — Progress Notes (Addendum)
[facility] [address] [place] [address] [phone]  Date:  05/08/2015   Name:  Abigail [name]   DOB:  04-04-1951   MRN:  [id number]  PCP:  Lamar Blinks, MD    Chief Complaint: Follow-up   History of Present Illness:  Abigail [name] is a 64 y.o. very pleasant female patient who presents with the following:  Here today to follow-up- seen here 4 days ago with diarrhea and RUQ pain.   At that time she seemed to be improving so we did labs but no other intervention. She is on prilosec at baseline for GERD She is here today because her sx persist and she is getting worried about her gallbladder She had been on a BRAT diet and has eaten little over the last few days.  Tried to eat a little more yesterday but noted that this led to increased pain and that the food seemed to "go right through" her, causing loose stools She notes that eating has increased her pain for the last several days.  She notes the pain in her RUQ  No fever, no vomiting  Nothing to eat today, had a little soda this am around 0900  She is not aware of any family history of gallstones  Patient Active Problem List   Diagnosis Date Noted  . Trimalleolar fracture of ankle, closed 11/17/2014  . Other and unspecified hyperlipidemia 07/24/2013  . Chest pain 10/23/2012  . Type II or unspecified type diabetes mellitus without mention of complication, uncontrolled 10/08/2012  . Anxiety 10/08/2012    Past Medical History  Diagnosis Date  . Hypertension   . Hyperlipidemia   . Anxiety   . GERD (gastroesophageal reflux disease)   . Diabetes mellitus     Type 2  . Arthritis     Past Surgical History  Procedure Laterality Date  . Ovarian cyst removal    . Hammer toe surgery    . Colonoscopy    . Orif ankle fracture Left 11/17/2014    Procedure: OPEN REDUCTION INTERNAL FIXATION (ORIF) LEFT TRIMALLEOLAR ANKLE FRACTURE;  Surgeon: Marianna Payment, MD;  Location: [facility];   Service: Orthopedics;  Laterality: Left;    History  Substance Use Topics  . Smoking status: Never Smoker   . Smokeless tobacco: Never Used  . Alcohol Use: 0.6 oz/week    1 Glasses of wine per week    Family History  Problem Relation Age of Onset  . Diabetes Mother   . Diabetes Father   . Heart disease Father 68    AMI x 2; first AMI age 55  . Heart disease Brother     defibrillator; CHF    Allergies  Allergen Reactions  . Amaryl [Glimepiride] Nausea Only  . Cephalexin Hives  . Sulfa Antibiotics Other (See Comments)    Unknown by pt    Medication list has been reviewed and updated.  Current Outpatient Prescriptions on File Prior to Visit  Medication Sig Dispense Refill  . aspirin EC 325 MG tablet Take 1 tablet (325 mg total) by mouth 2 (two) times daily. 84 tablet 0  . atorvastatin (LIPITOR) 20 MG tablet Take 1 tablet (20 mg total) by mouth daily. 90 tablet 3  . FLUoxetine (PROZAC) 20 MG capsule Take 1 capsule (20 mg total) by mouth daily. 90 capsule 3  . glipiZIDE (GLUCOTROL) 5 MG tablet TAKE 1 TABLET BY MOUTH EVERY NIGHT AT BEDTIME 30 tablet 5  . Liraglutide (VICTOZA) 18  MG/3ML SOPN INJECT 1.2 MG INTO THE SKIN. Convert to 3 month supply with 4RF if allowed by insurance 6 mL 3  . LORazepam (ATIVAN) 0.5 MG tablet     . losartan (COZAAR) 25 MG tablet Take 1 tablet (25 mg total) by mouth daily. 90 tablet 0  . metFORMIN (GLUCOPHAGE) 1000 MG tablet TAKE 1 TABLET BY MOUTH TWICE DAILY WITH A MEAL 180 tablet 3  . methocarbamol (ROBAXIN) 750 MG tablet Take 1 tablet (750 mg total) by mouth 2 (two) times daily as needed for muscle spasms. 60 tablet 0  . ONETOUCH VERIO test strip TEST BLOOD SUGARS TWICE DAILY 100 each 3  . traZODone (DESYREL) 150 MG tablet Take 1 tablet (150 mg total) by mouth at bedtime. 90 tablet 3  . atorvastatin (LIPITOR) 20 MG tablet Take 1 tablet (20 mg total) by mouth daily. 90 tablet 3  . omeprazole (PRILOSEC) 20 MG capsule Take 1 capsule (20 mg total) by  mouth daily. (Patient not taking: Reported on 05/08/2015) 90 capsule 3  . oxyCODONE (OXY IR/ROXICODONE) 5 MG immediate release tablet Take 1-3 tablets (5-15 mg total) by mouth every 4 (four) hours as needed. (Patient not taking: Reported on 05/04/2015) 90 tablet 0  . oxyCODONE-acetaminophen (PERCOCET) 5-325 MG per tablet Take 2 tablets by mouth every 4 (four) hours as needed. (Patient not taking: Reported on 05/04/2015) 20 tablet 0   No current facility-administered medications on file prior to visit.    Review of Systems:  As per HPI- otherwise negative.   Physical Examination: Filed Vitals:   05/08/15 1053  BP: 116/70  Pulse: 107  Temp: 98.7 F (37.1 C)  Resp: 18   Filed Vitals:   05/08/15 1053  Height: 5\' 5"  (1.651 m)  Weight: 139 lb (63.05 kg)   Body mass index is 23.13 kg/(m^2). Ideal Body Weight: Weight in (lb) to have BMI = 25: 149.9  GEN: WDWN, NAD, Non-toxic, A & O x 3, looks well HEENT: Atraumatic, Normocephalic. Neck supple. No masses, No LAD. Ears and Nose: No external deformity. CV: RRR, No M/G/R. No JVD. No thrill. No extra heart sounds. PULM: CTA B, no wheezes, crackles, rhonchi. No retractions. No resp. distress. No accessory muscle use. ABD: S,  ND, +BS. No rebound. No HSM. RUQ tenderness to palpation and murphy's sign, no guarding or rigidity EXTR: No c/c/e NEURO Normal gait.  PSYCH: Normally interactive. Conversant. Not depressed or anxious appearing.  Calm demeanor.   Wt Readings from Last 3 Encounters:  05/08/15 139 lb (63.05 kg)  05/04/15 142 lb 6.4 oz (64.592 kg)  02/20/15 144 lb 9.6 oz (65.59 kg)    Results for orders placed or performed in visit on 05/08/15  POCT CBC  Result Value Ref Range   WBC 7.3 4.6 - 10.2 K/uL   Lymph, poc 2.2 0.6 - 3.4   POC LYMPH PERCENT 30.3 10 - 50 %L   MID (cbc) 0.5 0 - 0.9   POC MID % 7.2 0 - 12 %M   POC Granulocyte 4.6 2 - 6.9   Granulocyte percent 62.5 37 - 80 %G   RBC 4.88 4.04 - 5.48 M/uL   Hemoglobin 13.2  12.2 - 16.2 g/dL   HCT, POC 39.5 37.7 - 47.9 %   MCV 81.0 80 - 97 fL   MCH, POC 27.0 27 - 31.2 pg   MCHC 33.3 31.8 - 35.4 g/dL   RDW, POC 13.8 %   Platelet Count, POC 277 142 - 424 K/uL  MPV 5.6 0 - 99.8 fL  POCT urinalysis dipstick  Result Value Ref Range   Color, UA dark yellow    Clarity, UA clear    Glucose, UA neg    Bilirubin, UA small    Ketones, UA neg    Spec Grav, UA >=1.030    Blood, UA trace-intact    pH, UA 5.5    Protein, UA 100    Urobilinogen, UA 0.2    Nitrite, UA neg    Leukocytes, UA Negative Negative  POCT UA - Microscopic Only  Result Value Ref Range   WBC, Ur, HPF, POC 1-3    RBC, urine, microscopic 1-5    Bacteria, U Microscopic 1+    Mucus, UA positive    Epithelial cells, urine per micros 2-6    Crystals, Ur, HPF, POC neg    Casts, Ur, LPF, POC neg    Yeast, UA neg    Renal tubular cells     COMPARISON: Abdominal ultrasound dated Mar 26, 2014  FINDINGS: Gallbladder: The gallbladder is adequately distended. There are tiny echogenic mobile non shadowing foci, the largest measuring 4 mm, 9:40 am which may reflect stones. There is no gallbladder wall thickening, pericholecystic fluid, or positive sonographic Murphy's sign.  Common bile duct: Diameter: 5 mm  Liver: The hepatic echotexture is slightly heterogeneous. There is a 9 x 7 x 10 mm hypoechoic focus in the right lobe with enhanced through transmission. There is no intrahepatic ductal dilation.  IVC: Bowel gas limits evaluation of the IVC.  Pancreas: The pancreas is normal in echotexture and contour. There is no pancreatic ductal dilation.  Spleen: Size and appearance within normal limits.  Right Kidney: Length: 10 mm. Echogenicity within normal limits. No mass or hydronephrosis visualized.  Left Kidney: Length: 9.1 mm. Echogenicity within normal limits. No mass or hydronephrosis visualized.  Abdominal aorta: No aneurysm visualized.  Other findings: There is no free pelvic  fluid.  IMPRESSION: 1. Tiny mobile gallstones without evidence of acute cholecystitis.  2. Heterogeneous hepatic echotexture. Stable right lobe cyst measuring 10 mm in greatest dimension. 3. There is no acute abnormality elsewhere within the abdomen.  Assessment and Plan: RUQ pain - Plan: POCT CBC, US Abdomen Complete, POCT urinalysis dipstick, POCT UA - Microscopic Only, Urine culture, Ambulatory referral to General Surgery, CANCELED: US Abdomen Limited RUQ  Bacteria in urine - Plan: ciprofloxacin (CIPRO) 500 MG tablet  Gallstones - Plan: Ambulatory referral to General Surgery   Discussed with doctor of the day at [facility] and with pt.  It is likely although not definite that her gallstones are causing her pain. At this time she does not need admission for urgent surgery, but does need close follow-up. She will continue her very light diet and made her an appt with surgery next week.  Also consider possible UTI/ pyelo as cause of her sx.  Will treat with cipro while we await her urine culture.  She will be sure to call me if any worsening or if not doing ok at home.  She agrees that she does not wish to be admitted and is able to take po liquids at home  [place], MD 9:40 am with Dr. Georgette Dover at [facility] 05/13/15  Arrive 30 minutes early   Called to discuss with her 7/19- she plans to have an elective chole when she can, she is doing well.   Discussed her urine results- her culture is borderline, does not indicate a definite infection.  This  leaves [place] with microhematuria and no definite explanation.  However as a non smoker her risk of bladder cancer is certainly limited.  Offered urology eval vs recheck of her urine and then decide on next step.  She prefers to recheck her urine and will bring in a sample for evaluation soon

## 2015-05-08 NOTE — Patient Instructions (Signed)
Go to [facility] ph# [phone] at 12:45 pm for 1:00pm appt for OUTPATIENT ULTRASOUND.

## 2015-05-09 NOTE — Telephone Encounter (Signed)
Called to check on her- she reports that she is feeling better.  She will let me know if she needs anything

## 2015-05-10 LAB — URINE CULTURE: Colony Count: 50000

## 2015-05-12 ENCOUNTER — Telehealth: Payer: Self-pay

## 2015-05-12 NOTE — Telephone Encounter (Signed)
Pt is needing to talk with dr copland about the referrals she is having tomorrow

## 2015-05-12 NOTE — Telephone Encounter (Signed)
Spoke to pt. She just had a question about Dr. Georgette Dover. She had not heard of him so she just wasn't sure if she should try to see someone else or see him. I told her it would be best to keep her apt with him unless she really wanted to see someone she was familiar with. She decided she will keep her appt with Dr. Georgette Dover tomorrow.

## 2015-05-13 ENCOUNTER — Ambulatory Visit: Payer: Self-pay | Admitting: Surgery

## 2015-05-13 DIAGNOSIS — K8010 Calculus of gallbladder with chronic cholecystitis without obstruction: Secondary | ICD-10-CM | POA: Diagnosis not present

## 2015-05-13 NOTE — H&P (Signed)
History of Present Illness Abigail [name]. Sahara Fujimoto MD; 05/13/2015 10:32 AM) Patient words: GB.  The patient is a 64 year old female who presents for evaluation of gall stones. This is a 64 year old female who presents with 3-5 years of intermittent RUQ abdominal pain, usually after eating fried foods or salads with dressing. Over the last 3 weeks, her symptoms have worsened and are associated with pain radiating around to her back, bloating, belching, nausea, and diarrhea. She had blood work that showed normal WBC and LFT's. Ultrasound showed gallstones without cholecystitis.   LFT's WNL  CLINICAL DATA: Postprandial right upper quadrant pain with nausea and diarrhea for the past 11 days, history of hyperlipidemia, gastroesophageal reflux, and liver cysts.  EXAM: ULTRASOUND ABDOMEN COMPLETE  COMPARISON: Abdominal ultrasound dated Mar 26, 2014  FINDINGS: Gallbladder: The gallbladder is adequately distended. There are tiny echogenic mobile non shadowing foci, the largest measuring 4 mm, which may reflect stones. There is no gallbladder wall thickening, pericholecystic fluid, or positive sonographic Murphy's sign.  Common bile duct: Diameter: 5 mm  Liver: The hepatic echotexture is slightly heterogeneous. There is a 9 x 7 x 10 mm hypoechoic focus in the right lobe with enhanced through transmission. There is no intrahepatic ductal dilation.  IVC: Bowel gas limits evaluation of the IVC.  Pancreas: The pancreas is normal in echotexture and contour. There is no pancreatic ductal dilation.  Spleen: Size and appearance within normal limits.  Right Kidney: Length: 10 mm. Echogenicity within normal limits. No mass or hydronephrosis visualized.  Left Kidney: Length: 9.1 mm. Echogenicity within normal limits. No mass or hydronephrosis visualized.  Abdominal aorta: No aneurysm visualized.  Other findings: There is no free pelvic fluid.  IMPRESSION: 1. Tiny mobile gallstones without  evidence of acute cholecystitis. 2. Heterogeneous hepatic echotexture. Stable right lobe cyst measuring 10 mm in greatest dimension. 3. There is no acute abnormality elsewhere within the abdomen.   Electronically Signed By: David Martinique M.D. On: 05/08/2015 14:02 Other Problems ([name]; 05/13/2015 9:33 AM) Back Pain Cholelithiasis Diabetes Mellitus Gastroesophageal Reflux Disease Hemorrhoids Hypercholesterolemia  Past Surgical History [name]; 05/13/2015 9:33 AM) Foot Surgery Left.  Diagnostic Studies History [name]; 05/13/2015 9:33 AM) Colonoscopy 1-5 years ago Mammogram within last year Pap Smear 1-5 years ago  Allergies [name]; 05/13/2015 9:35 AM) Amaryl *ANTIDIABETICS* Cephalexin *CEPHALOSPORINS* Sulfa 10 *OPHTHALMIC AGENTS*  Medication History ([name]; 05/13/2015 9:37 AM) Atorvastatin Calcium (20MG  Tablet, Oral) Active. Aspirin EC (325MG  Tablet DR, Oral) Active. Ciprofloxacin HCl (500MG  Tablet, Oral) Active. FLUoxetine HCl (20MG  Capsule, Oral) Active. GlipiZIDE (5MG  Tablet, Oral) Active. Victoza (18MG /3ML Soln Pen-inj, Subcutaneous) Active. LORazepam (0.5MG  Tablet, Oral) Active. Losartan Potassium (25MG  Tablet, Oral) Active. TraZODone HCl (150MG  Tablet, Oral) Active. MetFORMIN HCl ER (OSM) (1000MG  Tablet ER 24HR, Oral) Active. Medications Reconciled  Social History [name]; 05/13/2015 9:33 AM) Alcohol use Occasional alcohol use. Caffeine use Tea. No drug use Tobacco use Never smoker.  Family History [name]; 05/13/2015 9:33 AM) Arthritis Father, Mother. Cerebrovascular Accident Father. Diabetes Mellitus Father, Mother. Heart Disease Brother, Father. Heart disease in female family member before age 2 Hypertension Brother, Father, Mother. Kidney Disease Mother. Rectal Cancer Family Members In General. Respiratory Condition Mother.  Pregnancy / Birth History [name];  05/13/2015 9:33 AM) Age at menarche 35 years. Age of menopause 74-50 Gravida 2 Maternal age 45-25 Para 2 Regular periods     Review of Systems [name]; 05/13/2015 9:33 AM) General Present- Appetite Loss and Weight Loss. Not Present- Chills,  Fatigue, Fever, Night Sweats and Weight Gain. Skin Not Present- Change in Wart/Mole, Dryness, Hives, Jaundice, New Lesions, Non-Healing Wounds, Rash and Ulcer. HEENT Not Present- Earache, Hearing Loss, Hoarseness, Nose Bleed, Oral Ulcers, Ringing in the Ears, Seasonal Allergies, Sinus Pain, Sore Throat, Visual Disturbances, Wears glasses/contact lenses and Yellow Eyes. Respiratory Not Present- Bloody sputum, Chronic Cough, Difficulty Breathing, Snoring and Wheezing. Breast Not Present- Breast Mass, Breast Pain, Nipple Discharge and Skin Changes. Cardiovascular Not Present- Chest Pain, Difficulty Breathing Lying Down, Leg Cramps, Palpitations, Rapid Heart Rate, Shortness of Breath and Swelling of Extremities. Gastrointestinal Present- Abdominal Pain, Change in Bowel Habits, Hemorrhoids and Nausea. Not Present- Bloating, Bloody Stool, Chronic diarrhea, Constipation, Difficulty Swallowing, Excessive gas, Gets full quickly at meals, Indigestion, Rectal Pain and Vomiting. Female Genitourinary Not Present- Frequency, Nocturia, Painful Urination, Pelvic Pain and Urgency. Musculoskeletal Present- Back Pain. Not Present- Joint Pain, Joint Stiffness, Muscle Pain, Muscle Weakness and Swelling of Extremities. Neurological Present- Headaches. Not Present- Decreased Memory, Fainting, Numbness, Seizures, Tingling, Tremor, Trouble walking and Weakness. Psychiatric Not Present- Anxiety, Bipolar, Change in Sleep Pattern, Depression, Fearful and Frequent crying. Endocrine Not Present- Cold Intolerance, Excessive Hunger, Hair Changes, Heat Intolerance, Hot flashes and New Diabetes. Hematology Not Present- Easy Bruising, Excessive bleeding, Gland problems, HIV and  Persistent Infections.  Vitals [name]; 05/13/2015 9:34 AM) 05/13/2015 9:34 AM Weight: 140.4 lb Height: 65in Body Surface Area: 1.71 m Body Mass Index: 23.36 kg/m Temp.: 97.61F  BP: 122/78 (Sitting, Left Arm, Standard)     Physical Exam Rodman Key K. Marche Hottenstein MD; 05/13/2015 10:32 AM)  The physical exam findings are as follows: Note:WDWN in NAD HEENT: EOMI, sclera anicteric Neck: No masses, no thyromegaly Lungs: CTA bilaterally; normal respiratory effort CV: Regular rate and rhythm; no murmurs Abd: +bowel sounds, soft, tender in RUQ, no masses Ext: Well-perfused; no edema Skin: Warm, dry; no sign of jaundice    Assessment & Plan Rodman Key K. Jenna Routzahn MD; 05/13/2015 9:52 AM)  CHRONIC CHOLECYSTITIS WITH CALCULUS (574.10  K80.10)  Current Plans Schedule for Surgery - Laparoscopic cholecystectomy with intraoperative cholangiogram. The surgical procedure has been discussed with the patient. Potential risks, benefits, alternative treatments, and expected outcomes have been explained. All of the patient's questions at this time have been answered. The likelihood of reaching the patient's treatment goal is good. The patient understand the proposed surgical procedure and wishes to proceed.   Abigail [name]. Georgette Dover, MD, [facility] Surgery  General/ Trauma Surgery  05/13/2015 10:33 AM

## 2015-05-18 DIAGNOSIS — S82852D Displaced trimalleolar fracture of left lower leg, subsequent encounter for closed fracture with routine healing: Secondary | ICD-10-CM | POA: Diagnosis not present

## 2015-05-21 ENCOUNTER — Telehealth: Payer: Self-pay | Admitting: *Deleted

## 2015-05-21 NOTE — Telephone Encounter (Signed)
Received faxed mammo report from [facility] dated 09/18/2014 - no mammographic evidence of malignancy.  Updated health maintenance, abstracted, and sent for PCP to review and to be sent to be scanned into EMR.

## 2015-05-26 NOTE — Addendum Note (Signed)
Addended by: Lamar Blinks C on: 05/26/2015 01:09 PM   Modules accepted: Orders

## 2015-06-01 ENCOUNTER — Encounter (HOSPITAL_COMMUNITY): Payer: Self-pay

## 2015-06-01 ENCOUNTER — Encounter (HOSPITAL_COMMUNITY)
Admission: RE | Admit: 2015-06-01 | Discharge: 2015-06-01 | Disposition: A | Discharge status: Home / Self Care | Payer: Medicare Other | Source: Ambulatory Visit | Attending: Surgery | Admitting: Surgery

## 2015-06-01 DIAGNOSIS — I10 Essential (primary) hypertension: Secondary | ICD-10-CM | POA: Diagnosis not present

## 2015-06-01 DIAGNOSIS — Z79899 Other long term (current) drug therapy: Secondary | ICD-10-CM | POA: Diagnosis not present

## 2015-06-01 DIAGNOSIS — E119 Type 2 diabetes mellitus without complications: Secondary | ICD-10-CM | POA: Diagnosis not present

## 2015-06-01 DIAGNOSIS — E780 Pure hypercholesterolemia: Secondary | ICD-10-CM | POA: Diagnosis not present

## 2015-06-01 DIAGNOSIS — K8010 Calculus of gallbladder with chronic cholecystitis without obstruction: Secondary | ICD-10-CM | POA: Diagnosis not present

## 2015-06-01 DIAGNOSIS — Z792 Long term (current) use of antibiotics: Secondary | ICD-10-CM | POA: Diagnosis not present

## 2015-06-01 DIAGNOSIS — I509 Heart failure, unspecified: Secondary | ICD-10-CM | POA: Diagnosis not present

## 2015-06-01 DIAGNOSIS — K219 Gastro-esophageal reflux disease without esophagitis: Secondary | ICD-10-CM | POA: Diagnosis not present

## 2015-06-01 DIAGNOSIS — F419 Anxiety disorder, unspecified: Secondary | ICD-10-CM | POA: Diagnosis not present

## 2015-06-01 DIAGNOSIS — Z7982 Long term (current) use of aspirin: Secondary | ICD-10-CM | POA: Diagnosis not present

## 2015-06-01 DIAGNOSIS — K819 Cholecystitis, unspecified: Secondary | ICD-10-CM | POA: Diagnosis present

## 2015-06-01 HISTORY — DX: Hematuria, unspecified: R31.9

## 2015-06-01 LAB — CBC
HEMATOCRIT: 36.5 % (ref 36.0–46.0)
HEMOGLOBIN: 12 g/dL (ref 12.0–15.0)
MCH: 27.8 pg (ref 26.0–34.0)
MCHC: 32.9 g/dL (ref 30.0–36.0)
MCV: 84.5 fL (ref 78.0–100.0)
Platelets: 158 10*3/uL (ref 150–400)
RBC: 4.32 MIL/uL (ref 3.87–5.11)
RDW: 12.8 % (ref 11.5–15.5)
WBC: 6.2 10*3/uL (ref 4.0–10.5)

## 2015-06-01 LAB — BASIC METABOLIC PANEL
Anion gap: 7 (ref 5–15)
BUN: 16 mg/dL (ref 6–20)
CALCIUM: 9.4 mg/dL (ref 8.9–10.3)
CHLORIDE: 101 mmol/L (ref 101–111)
CO2: 29 mmol/L (ref 22–32)
Creatinine, Ser: 1.05 mg/dL — ABNORMAL HIGH (ref 0.44–1.00)
GFR calc non Af Amer: 55 mL/min — ABNORMAL LOW (ref >60)
GLUCOSE: 115 mg/dL — AB (ref 65–99)
POTASSIUM: 4.6 mmol/L (ref 3.5–5.1)
Sodium: 137 mmol/L (ref 135–145)

## 2015-06-01 LAB — GLUCOSE, CAPILLARY: Glucose-Capillary: 138 mg/dL — ABNORMAL HIGH (ref 65–99)

## 2015-06-01 NOTE — Pre-Procedure Instructions (Signed)
    Abigail [name] [facility]  06/01/2015      [facility] [place], [place] [address] [place] [address] Phone: [phone] Fax: [phone]  [facility] - [place], [place] - [address] [place] [address] Phone: [phone] Fax: [phone]  [facility] - [place], [place] AT [facility] [address] [place] [address] Phone: [phone] Fax: [phone]    Your procedure is scheduled on 06/03/2015.  Report to [facility] Admitting at 820 A.M.  Call this number if you have problems in the days leading up to your surgery:  [phone]  Call this number if you have problems the morning of surgery:  [phone]   Remember:  Do not eat food or drink liquids after midnight on Tuesday July 26th, 2016.  Take these medicines the morning of surgery with A SIP OF WATER Fluoxetine (Prozac), Lorazepam (Ativan), Omeprazole (Prilosec)   STOP: ALL Vitamins, Supplements, Herbal Medications, Fish Oils, Aspirins, NSAIDs (Nonsteroidal Anti-inflammatories such as Ibuprofen, Aleve, or Advil), and Goody's/BC Powders 7 days prior to surgery, until after surgery as directed by your physician.     Do not wear jewelry, make-up or nail polish.  Do not wear lotions, powders, or perfumes.  You may wear deodorant.  Do not shave 48 hours prior to surgery.  Men may shave face and neck.  Do not bring valuables to the hospital.  [facility] is not responsible for any belongings or valuables.  Contacts, dentures or bridgework may not be worn into surgery.  Leave your suitcase in the car.  After surgery it may be brought to your room.  For patients admitted to the hospital, discharge time will be determined by your treatment team.  Patients discharged the day of surgery will not be allowed to drive home.     Special instructions:   Please follow these instructions carefully:  1. Shower with CHG Soap the night before surgery and the morning of Surgery. 2. If you choose to wash your hair, wash your hair first as usual with your normal shampoo. 3. After you shampoo, rinse your hair and body thoroughly to remove the Shampoo. 4. Use CHG as you would any other liquid soap. You can apply chg directly to the skin and wash gently with scrungie or a clean washcloth. 5. Apply the CHG Soap to your body ONLY FROM THE NECK DOWN. Do not use on open wounds or open sores. Avoid contact with your eyes, ears, mouth and genitals (private parts). Wash genitals (private parts) with your normal soap. 6. Wash thoroughly, paying special attention to the area where your surgery will be performed. 7. Thoroughly rinse your body with warm water from the neck down. 8. DO NOT shower/wash with your normal soap after using and rinsing off the CHG Soap. 9. Pat yourself dry with a clean towel.  10. Wear clean pajamas.  11. Place clean sheets on your bed the night of your first shower and do not sleep with pets.  Day of Surgery  Do not apply any lotions/deodorants the morning of surgery. Please wear clean clothes to the hospital/surgery center.    Please read over the following fact sheets that you were given. Pain Booklet, Coughing and Deep Breathing and Surgical Site Infection Prevention

## 2015-06-02 LAB — HEMOGLOBIN A1C
Hgb A1c MFr Bld: 6 % — ABNORMAL HIGH (ref 4.8–5.6)
Mean Plasma Glucose: 126 mg/dL

## 2015-06-02 MED ORDER — CIPROFLOXACIN IN D5W 400 MG/200ML IV SOLN
400.0000 mg | INTRAVENOUS | Status: AC
  Administered 2015-06-03: 400 mg via INTRAVENOUS
  Filled 2015-06-02: qty 200

## 2015-06-03 ENCOUNTER — Ambulatory Visit (HOSPITAL_COMMUNITY)
Admission: RE | Admit: 2015-06-03 | Discharge: 2015-06-03 | Disposition: A | Discharge status: Home / Self Care | Payer: Medicare Other | Source: Ambulatory Visit | Attending: Surgery | Admitting: Surgery

## 2015-06-03 ENCOUNTER — Encounter (HOSPITAL_COMMUNITY): Payer: Self-pay | Admitting: *Deleted

## 2015-06-03 ENCOUNTER — Ambulatory Visit (HOSPITAL_COMMUNITY): Payer: Medicare Other | Admitting: Anesthesiology

## 2015-06-03 ENCOUNTER — Encounter (HOSPITAL_COMMUNITY)
Admission: RE | Disposition: A | Discharge status: Home / Self Care | Payer: Self-pay | Source: Ambulatory Visit | Attending: Surgery

## 2015-06-03 ENCOUNTER — Ambulatory Visit (HOSPITAL_COMMUNITY): Payer: Medicare Other

## 2015-06-03 DIAGNOSIS — E119 Type 2 diabetes mellitus without complications: Secondary | ICD-10-CM | POA: Insufficient documentation

## 2015-06-03 DIAGNOSIS — Z79899 Other long term (current) drug therapy: Secondary | ICD-10-CM | POA: Insufficient documentation

## 2015-06-03 DIAGNOSIS — Z7982 Long term (current) use of aspirin: Secondary | ICD-10-CM | POA: Insufficient documentation

## 2015-06-03 DIAGNOSIS — I10 Essential (primary) hypertension: Secondary | ICD-10-CM | POA: Diagnosis not present

## 2015-06-03 DIAGNOSIS — F419 Anxiety disorder, unspecified: Secondary | ICD-10-CM | POA: Insufficient documentation

## 2015-06-03 DIAGNOSIS — I509 Heart failure, unspecified: Secondary | ICD-10-CM | POA: Diagnosis not present

## 2015-06-03 DIAGNOSIS — K811 Chronic cholecystitis: Secondary | ICD-10-CM | POA: Diagnosis not present

## 2015-06-03 DIAGNOSIS — K219 Gastro-esophageal reflux disease without esophagitis: Secondary | ICD-10-CM | POA: Diagnosis not present

## 2015-06-03 DIAGNOSIS — E780 Pure hypercholesterolemia: Secondary | ICD-10-CM | POA: Diagnosis not present

## 2015-06-03 DIAGNOSIS — K8010 Calculus of gallbladder with chronic cholecystitis without obstruction: Secondary | ICD-10-CM | POA: Insufficient documentation

## 2015-06-03 DIAGNOSIS — Z419 Encounter for procedure for purposes other than remedying health state, unspecified: Secondary | ICD-10-CM

## 2015-06-03 DIAGNOSIS — K838 Other specified diseases of biliary tract: Secondary | ICD-10-CM | POA: Diagnosis not present

## 2015-06-03 DIAGNOSIS — K819 Cholecystitis, unspecified: Secondary | ICD-10-CM | POA: Diagnosis not present

## 2015-06-03 DIAGNOSIS — Z792 Long term (current) use of antibiotics: Secondary | ICD-10-CM | POA: Insufficient documentation

## 2015-06-03 HISTORY — PX: CHOLECYSTECTOMY: SHX55

## 2015-06-03 LAB — GLUCOSE, CAPILLARY
Glucose-Capillary: 89 mg/dL (ref 65–99)
Glucose-Capillary: 139 mg/dL — ABNORMAL HIGH (ref 65–99)

## 2015-06-03 SURGERY — LAPAROSCOPIC CHOLECYSTECTOMY WITH INTRAOPERATIVE CHOLANGIOGRAM
Anesthesia: General | Site: Abdomen

## 2015-06-03 MED ORDER — PROPOFOL 10 MG/ML IV BOLUS
INTRAVENOUS | Status: AC
  Filled 2015-06-03: qty 20

## 2015-06-03 MED ORDER — ROCURONIUM BROMIDE 50 MG/5ML IV SOLN
INTRAVENOUS | Status: AC
  Filled 2015-06-03: qty 1

## 2015-06-03 MED ORDER — ONDANSETRON HCL 4 MG/2ML IJ SOLN: 4.0000 mg | INTRAMUSCULAR | Status: DC | PRN

## 2015-06-03 MED ORDER — ONDANSETRON HCL 4 MG/2ML IJ SOLN
INTRAMUSCULAR | Status: AC
  Filled 2015-06-03: qty 2

## 2015-06-03 MED ORDER — MIDAZOLAM HCL 2 MG/2ML IJ SOLN
INTRAMUSCULAR | Status: AC
  Filled 2015-06-03: qty 2

## 2015-06-03 MED ORDER — NEOSTIGMINE METHYLSULFATE 10 MG/10ML IV SOLN
INTRAVENOUS | Status: DC | PRN
  Administered 2015-06-03: 3 mg via INTRAVENOUS

## 2015-06-03 MED ORDER — ARTIFICIAL TEARS OP OINT
TOPICAL_OINTMENT | OPHTHALMIC | Status: DC | PRN
  Administered 2015-06-03: 1 via OPHTHALMIC

## 2015-06-03 MED ORDER — OXYCODONE-ACETAMINOPHEN 5-325 MG PO TABS: 1.0000 | ORAL_TABLET | ORAL | Status: DC | PRN

## 2015-06-03 MED ORDER — GLYCOPYRROLATE 0.2 MG/ML IJ SOLN
INTRAMUSCULAR | Status: AC
  Filled 2015-06-03: qty 2

## 2015-06-03 MED ORDER — OXYCODONE HCL 5 MG/5ML PO SOLN: 5.0000 mg | Freq: Once | ORAL | Status: DC | PRN

## 2015-06-03 MED ORDER — ACETAMINOPHEN 160 MG/5ML PO SOLN
325.0000 mg | ORAL | Status: DC | PRN
  Filled 2015-06-03: qty 20.3

## 2015-06-03 MED ORDER — MIDAZOLAM HCL 5 MG/5ML IJ SOLN
INTRAMUSCULAR | Status: DC | PRN
  Administered 2015-06-03 (×2): 1 mg via INTRAVENOUS

## 2015-06-03 MED ORDER — BUPIVACAINE-EPINEPHRINE 0.25% -1:200000 IJ SOLN
INTRAMUSCULAR | Status: DC | PRN
  Administered 2015-06-03: 6 mL

## 2015-06-03 MED ORDER — ESMOLOL HCL 10 MG/ML IV SOLN
INTRAVENOUS | Status: DC | PRN
  Administered 2015-06-03: 30 mg via INTRAVENOUS

## 2015-06-03 MED ORDER — PROPOFOL 10 MG/ML IV BOLUS
INTRAVENOUS | Status: DC | PRN
  Administered 2015-06-03: 130 mg via INTRAVENOUS

## 2015-06-03 MED ORDER — GLYCOPYRROLATE 0.2 MG/ML IJ SOLN
INTRAMUSCULAR | Status: DC | PRN
  Administered 2015-06-03: 0.4 mg via INTRAVENOUS

## 2015-06-03 MED ORDER — LIDOCAINE HCL (CARDIAC) 20 MG/ML IV SOLN
INTRAVENOUS | Status: DC | PRN
  Administered 2015-06-03: 60 mg via INTRAVENOUS

## 2015-06-03 MED ORDER — BUPIVACAINE-EPINEPHRINE (PF) 0.25% -1:200000 IJ SOLN
INTRAMUSCULAR | Status: AC
  Filled 2015-06-03: qty 30

## 2015-06-03 MED ORDER — CALCIUM CHLORIDE 10 % IV SOLN
INTRAVENOUS | Status: AC
  Filled 2015-06-03: qty 10

## 2015-06-03 MED ORDER — LABETALOL HCL 5 MG/ML IV SOLN
INTRAVENOUS | Status: AC
  Filled 2015-06-03: qty 4

## 2015-06-03 MED ORDER — OXYCODONE HCL 5 MG PO TABS: 5.0000 mg | ORAL_TABLET | Freq: Once | ORAL | Status: DC | PRN

## 2015-06-03 MED ORDER — ROCURONIUM BROMIDE 100 MG/10ML IV SOLN
INTRAVENOUS | Status: DC | PRN
  Administered 2015-06-03: 30 mg via INTRAVENOUS

## 2015-06-03 MED ORDER — FENTANYL CITRATE (PF) 250 MCG/5ML IJ SOLN
INTRAMUSCULAR | Status: AC
  Filled 2015-06-03: qty 5

## 2015-06-03 MED ORDER — LACTATED RINGERS IV SOLN
INTRAVENOUS | Status: DC
  Administered 2015-06-03 (×2): via INTRAVENOUS

## 2015-06-03 MED ORDER — FENTANYL CITRATE (PF) 100 MCG/2ML IJ SOLN
INTRAMUSCULAR | Status: DC
Start: 2015-06-03 — End: 2015-06-03
  Filled 2015-06-03: qty 2

## 2015-06-03 MED ORDER — SODIUM CHLORIDE 0.9 % IR SOLN
Status: DC | PRN
  Administered 2015-06-03: 1000 mL

## 2015-06-03 MED ORDER — SODIUM CHLORIDE 0.9 % IV SOLN
INTRAVENOUS | Status: DC | PRN
  Administered 2015-06-03: 12 mL

## 2015-06-03 MED ORDER — FENTANYL CITRATE (PF) 100 MCG/2ML IJ SOLN
INTRAMUSCULAR | Status: DC | PRN
  Administered 2015-06-03 (×2): 100 ug via INTRAVENOUS
  Administered 2015-06-03: 50 ug via INTRAVENOUS

## 2015-06-03 MED ORDER — CHLORHEXIDINE GLUCONATE 4 % EX LIQD: 1.0000 "application " | Freq: Once | CUTANEOUS | Status: DC

## 2015-06-03 MED ORDER — LIDOCAINE HCL (CARDIAC) 20 MG/ML IV SOLN
INTRAVENOUS | Status: AC
  Filled 2015-06-03: qty 5

## 2015-06-03 MED ORDER — FENTANYL CITRATE (PF) 100 MCG/2ML IJ SOLN
25.0000 ug | INTRAMUSCULAR | Status: DC | PRN
  Administered 2015-06-03 (×2): 25 ug via INTRAVENOUS

## 2015-06-03 MED ORDER — ACETAMINOPHEN 325 MG PO TABS: 325.0000 mg | ORAL_TABLET | ORAL | Status: DC | PRN

## 2015-06-03 MED ORDER — MORPHINE SULFATE 2 MG/ML IJ SOLN: 2.0000 mg | INTRAMUSCULAR | Status: DC | PRN

## 2015-06-03 MED ORDER — ONDANSETRON HCL 4 MG/2ML IJ SOLN
INTRAMUSCULAR | Status: DC | PRN
  Administered 2015-06-03: 4 mg via INTRAVENOUS

## 2015-06-03 SURGICAL SUPPLY — 41 items
APPLIER CLIP ROT 10 11.4 M/L (STAPLE) ×3
BENZOIN TINCTURE PRP APPL 2/3 (GAUZE/BANDAGES/DRESSINGS) ×3 IMPLANT
BLADE SURG ROTATE 9660 (MISCELLANEOUS) IMPLANT
CANISTER SUCTION 2500CC (MISCELLANEOUS) ×3 IMPLANT
CHLORAPREP W/TINT 26ML (MISCELLANEOUS) ×3 IMPLANT
CLIP APPLIE ROT 10 11.4 M/L (STAPLE) ×1 IMPLANT
CLOSURE STERI-STRIP 1/2X4 (GAUZE/BANDAGES/DRESSINGS) ×1
CLSR STERI-STRIP ANTIMIC 1/2X4 (GAUZE/BANDAGES/DRESSINGS) ×2 IMPLANT
COVER MAYO STAND STRL (DRAPES) ×3 IMPLANT
COVER SURGICAL LIGHT HANDLE (MISCELLANEOUS) ×3 IMPLANT
DRAPE C-ARM 42X72 X-RAY (DRAPES) ×3 IMPLANT
DRSG TEGADERM 2-3/8X2-3/4 SM (GAUZE/BANDAGES/DRESSINGS) ×3 IMPLANT
DRSG TEGADERM 4X4.75 (GAUZE/BANDAGES/DRESSINGS) ×3 IMPLANT
ELECT REM PT RETURN 9FT ADLT (ELECTROSURGICAL) ×3
ELECTRODE REM PT RTRN 9FT ADLT (ELECTROSURGICAL) ×1 IMPLANT
FILTER SMOKE EVAC LAPAROSHD (FILTER) ×3 IMPLANT
GAUZE SPONGE 2X2 8PLY STRL LF (GAUZE/BANDAGES/DRESSINGS) ×1 IMPLANT
GLOVE BIO SURGEON STRL SZ7 (GLOVE) ×3 IMPLANT
GLOVE BIOGEL PI IND STRL 7.5 (GLOVE) ×1 IMPLANT
GLOVE BIOGEL PI INDICATOR 7.5 (GLOVE) ×2
GOWN STRL REUS W/ TWL LRG LVL3 (GOWN DISPOSABLE) ×3 IMPLANT
GOWN STRL REUS W/TWL LRG LVL3 (GOWN DISPOSABLE) ×6
KIT BASIN OR (CUSTOM PROCEDURE TRAY) ×3 IMPLANT
KIT ROOM TURNOVER OR (KITS) ×3 IMPLANT
NS IRRIG 1000ML POUR BTL (IV SOLUTION) ×3 IMPLANT
PAD ARMBOARD 7.5X6 YLW CONV (MISCELLANEOUS) ×3 IMPLANT
POUCH SPECIMEN RETRIEVAL 10MM (ENDOMECHANICALS) ×3 IMPLANT
SCISSORS LAP 5X35 DISP (ENDOMECHANICALS) ×3 IMPLANT
SET CHOLANGIOGRAPH 5 50 .035 (SET/KITS/TRAYS/PACK) ×3 IMPLANT
SET IRRIG TUBING LAPAROSCOPIC (IRRIGATION / IRRIGATOR) ×3 IMPLANT
SLEEVE ENDOPATH XCEL 5M (ENDOMECHANICALS) ×3 IMPLANT
SPECIMEN JAR SMALL (MISCELLANEOUS) ×3 IMPLANT
SPONGE GAUZE 2X2 STER 10/PKG (GAUZE/BANDAGES/DRESSINGS) ×2
SUT MNCRL AB 4-0 PS2 18 (SUTURE) ×3 IMPLANT
TOWEL OR 17X24 6PK STRL BLUE (TOWEL DISPOSABLE) ×3 IMPLANT
TOWEL OR 17X26 10 PK STRL BLUE (TOWEL DISPOSABLE) ×3 IMPLANT
TRAY LAPAROSCOPIC MC (CUSTOM PROCEDURE TRAY) ×3 IMPLANT
TROCAR XCEL BLUNT TIP 100MML (ENDOMECHANICALS) ×3 IMPLANT
TROCAR XCEL NON-BLD 11X100MML (ENDOMECHANICALS) ×3 IMPLANT
TROCAR XCEL NON-BLD 5MMX100MML (ENDOMECHANICALS) ×3 IMPLANT
TUBING INSUFFLATION (TUBING) ×3 IMPLANT

## 2015-06-03 NOTE — Anesthesia Procedure Notes (Signed)
Procedure Name: Intubation Date/Time: 06/03/2015 10:22 AM Performed by: Scheryl Darter Pre-anesthesia Checklist: Patient identified, Emergency Drugs available, Suction available, Patient being monitored and Timeout performed Patient Re-evaluated:Patient Re-evaluated prior to inductionOxygen Delivery Method: Circle system utilized Preoxygenation: Pre-oxygenation with 100% oxygen Intubation Type: IV induction Ventilation: Mask ventilation without difficulty Laryngoscope Size: Miller and 2 Grade View: Grade I Tube type: Oral Tube size: 7.0 mm Number of attempts: 1 Airway Equipment and Method: Stylet Placement Confirmation: ETT inserted through vocal cords under direct vision,  positive ETCO2 and breath sounds checked- equal and bilateral Secured at: 22 cm Tube secured with: Tape Dental Injury: Teeth and Oropharynx as per pre-operative assessment

## 2015-06-03 NOTE — H&P (View-Only) (Signed)
History of Present Illness Abigail [name]. Abigail [name]; 05/13/2015 10:32 AM) Patient words: GB.  The patient is a 64 year old female who presents for evaluation of gall stones. This is a 64 year old female who presents with 3-5 years of intermittent RUQ abdominal pain, usually after eating fried foods or salads with dressing. Over the last 3 weeks, her symptoms have worsened and are associated with pain radiating around to her back, bloating, belching, nausea, and diarrhea. She had blood work that showed normal WBC and LFT's. Ultrasound showed gallstones without cholecystitis.   LFT's WNL  CLINICAL DATA: Postprandial right upper quadrant pain with nausea and diarrhea for the past 11 days, history of hyperlipidemia, gastroesophageal reflux, and liver cysts.  EXAM: ULTRASOUND ABDOMEN COMPLETE  COMPARISON: Abdominal ultrasound dated Mar 26, 2014  FINDINGS: Gallbladder: The gallbladder is adequately distended. There are tiny echogenic mobile non shadowing foci, the largest measuring 4 mm, which may reflect stones. There is no gallbladder wall thickening, pericholecystic fluid, or positive sonographic Murphy's sign.  Common bile duct: Diameter: 5 mm  Liver: The hepatic echotexture is slightly heterogeneous. There is a 9 x 7 x 10 mm hypoechoic focus in the right lobe with enhanced through transmission. There is no intrahepatic ductal dilation.  IVC: Bowel gas limits evaluation of the IVC.  Pancreas: The pancreas is normal in echotexture and contour. There is no pancreatic ductal dilation.  Spleen: Size and appearance within normal limits.  Right Kidney: Length: 10 mm. Echogenicity within normal limits. No mass or hydronephrosis visualized.  Left Kidney: Length: 9.1 mm. Echogenicity within normal limits. No mass or hydronephrosis visualized.  Abdominal aorta: No aneurysm visualized.  Other findings: There is no free pelvic fluid.  IMPRESSION: 1. Tiny mobile gallstones without  evidence of acute cholecystitis. 2. Heterogeneous hepatic echotexture. Stable right lobe cyst measuring 10 mm in greatest dimension. 3. There is no acute abnormality elsewhere within the abdomen.   Electronically Signed By: Abigail [name] M.D. On: 05/08/2015 14:02 Other Problems (Abigail [name]; 05/13/2015 9:33 AM) Back Pain Cholelithiasis Diabetes Mellitus Gastroesophageal Reflux Disease Hemorrhoids Hypercholesterolemia  Past Surgical History Abigail [name]; 05/13/2015 9:33 AM) Foot Surgery Left.  Diagnostic Studies History Abigail [name]; 05/13/2015 9:33 AM) Colonoscopy 1-5 years ago Mammogram within last year Pap Smear 1-5 years ago  Allergies Abigail [name]; 05/13/2015 9:35 AM) Amaryl *ANTIDIABETICS* Cephalexin *CEPHALOSPORINS* Sulfa 10 *OPHTHALMIC AGENTS*  Medication History (Abigail [name]; 05/13/2015 9:37 AM) Atorvastatin Calcium (20MG  Tablet, Oral) Active. Aspirin EC (325MG  Tablet DR, Oral) Active. Ciprofloxacin HCl (500MG  Tablet, Oral) Active. FLUoxetine HCl (20MG  Capsule, Oral) Active. GlipiZIDE (5MG  Tablet, Oral) Active. Victoza (18MG /3ML Soln Pen-inj, Subcutaneous) Active. LORazepam (0.5MG  Tablet, Oral) Active. Losartan Potassium (25MG  Tablet, Oral) Active. TraZODone HCl (150MG  Tablet, Oral) Active. MetFORMIN HCl ER (OSM) (1000MG  Tablet ER 24HR, Oral) Active. Medications Reconciled  Social History Abigail [name]; 05/13/2015 9:33 AM) Alcohol use Occasional alcohol use. Caffeine use Tea. No drug use Tobacco use Never smoker.  Family History Abigail [name]; 05/13/2015 9:33 AM) Arthritis Father, Mother. Cerebrovascular Accident Father. Diabetes Mellitus Father, Mother. Heart Disease Brother, Father. Heart disease in female family member before age 85 Hypertension Brother, Father, Mother. Kidney Disease Mother. Rectal Cancer Family Members In General. Respiratory Condition Mother.  Pregnancy / Birth History Abigail [name];  05/13/2015 9:33 AM) Age at menarche 46 years. Age of menopause 54-50 Gravida 2 Maternal age 41-25 Para 2 Regular periods     Review of Systems Abigail [name]; 05/13/2015 9:33 AM) General Present- Appetite Loss and Weight Loss. Not Present- Chills,  Fatigue, Fever, Night Sweats and Weight Gain. Skin Not Present- Change in Wart/Mole, Dryness, Hives, Jaundice, New Lesions, Non-Healing Wounds, Rash and Ulcer. HEENT Not Present- Earache, Hearing Loss, Hoarseness, Nose Bleed, Oral Ulcers, Ringing in the Ears, Seasonal Allergies, Sinus Pain, Sore Throat, Visual Disturbances, Wears glasses/contact lenses and Yellow Eyes. Respiratory Not Present- Bloody sputum, Chronic Cough, Difficulty Breathing, Snoring and Wheezing. Breast Not Present- Breast Mass, Breast Pain, Nipple Discharge and Skin Changes. Cardiovascular Not Present- Chest Pain, Difficulty Breathing Lying Down, Leg Cramps, Palpitations, Rapid Heart Rate, Shortness of Breath and Swelling of Extremities. Gastrointestinal Present- Abdominal Pain, Change in Bowel Habits, Hemorrhoids and Nausea. Not Present- Bloating, Bloody Stool, Chronic diarrhea, Constipation, Difficulty Swallowing, Excessive gas, Gets full quickly at meals, Indigestion, Rectal Pain and Vomiting. Female Genitourinary Not Present- Frequency, Nocturia, Painful Urination, Pelvic Pain and Urgency. Musculoskeletal Present- Back Pain. Not Present- Joint Pain, Joint Stiffness, Muscle Pain, Muscle Weakness and Swelling of Extremities. Neurological Present- Headaches. Not Present- Decreased Memory, Fainting, Numbness, Seizures, Tingling, Tremor, Trouble walking and Weakness. Psychiatric Not Present- Anxiety, Bipolar, Change in Sleep Pattern, Depression, Fearful and Frequent crying. Endocrine Not Present- Cold Intolerance, Excessive Hunger, Hair Changes, Heat Intolerance, Hot flashes and New Diabetes. Hematology Not Present- Easy Bruising, Excessive bleeding, Gland problems, HIV and  Persistent Infections.  Vitals Abigail [name]; 05/13/2015 9:34 AM) 05/13/2015 9:34 AM Weight: 140.4 lb Height: 65in Body Surface Area: 1.71 m Body Mass Index: 23.36 kg/m Temp.: 97.64F  BP: 122/78 (Sitting, Left Arm, Standard)     Physical Exam Abigail [name]; 05/13/2015 10:32 AM)  The physical exam findings are as follows: Note:WDWN in NAD HEENT: EOMI, sclera anicteric Neck: No masses, no thyromegaly Lungs: CTA bilaterally; normal respiratory effort CV: Regular rate and rhythm; no murmurs Abd: +bowel sounds, soft, tender in RUQ, no masses Ext: Well-perfused; no edema Skin: Warm, dry; no sign of jaundice    Assessment & Plan Abigail Key K. Montie Gelardi [name]; 05/13/2015 9:52 AM)  CHRONIC CHOLECYSTITIS WITH CALCULUS (574.10  K80.10)  Current Plans Schedule for Surgery - Laparoscopic cholecystectomy with intraoperative cholangiogram. The surgical procedure has been discussed with the patient. Potential risks, benefits, alternative treatments, and expected outcomes have been explained. All of the patient's questions at this time have been answered. The likelihood of reaching the patient's treatment goal is good. The patient understand the proposed surgical procedure and wishes to proceed.   Abigail [name]. Georgette Dover, [name], [facility] Surgery  General/ Trauma Surgery  05/13/2015 10:33 AM

## 2015-06-03 NOTE — Transfer of Care (Signed)
Immediate Anesthesia Transfer of Care Note  Patient: Abigail [name] [facility]  Procedure(s) Performed: Procedure(s): LAPAROSCOPIC CHOLECYSTECTOMY WITH INTRAOPERATIVE CHOLANGIOGRAM (N/A)  Patient Location: PACU  Anesthesia Type:General  Level of Consciousness: awake, alert , oriented and sedated  Airway & Oxygen Therapy: Patient Spontanous Breathing and Patient connected to nasal cannula oxygen  Post-op Assessment: Report given to RN, Post -op Vital signs reviewed and stable and Patient moving all extremities  Post vital signs: Reviewed and stable  Last Vitals:  Filed Vitals:   06/03/15 1130  BP: 166/87  Pulse: 130  Temp: 36.8 C  Resp: 19    Complications: No apparent anesthesia complications

## 2015-06-03 NOTE — Op Note (Signed)
Laparoscopic Cholecystectomy with IOC Procedure Note  Indications: This patient presents with symptomatic gallbladder disease and will undergo laparoscopic cholecystectomy.  Pre-operative Diagnosis: Calculus of gallbladder with other cholecystitis, without mention of obstruction  Post-operative Diagnosis: Same  Surgeon: Rafi Kenneth K.   Assistants: Judyann Munson RNFA  Anesthesia: General endotracheal anesthesia  ASA Class: 1  Procedure Details  The patient was seen again in the Holding Room. The risks, benefits, complications, treatment options, and expected outcomes were discussed with the patient. The possibilities of reaction to medication, pulmonary aspiration, perforation of viscus, bleeding, recurrent infection, finding a normal gallbladder, the need for additional procedures, failure to diagnose a condition, the possible need to convert to an open procedure, and creating a complication requiring transfusion or operation were discussed with the patient. The likelihood of improving the patient's symptoms with return to their baseline status is good.  The patient and/or family concurred with the proposed plan, giving informed consent. The site of surgery properly noted. The patient was taken to Operating Room, identified as Abigail [name] and the procedure verified as Laparoscopic Cholecystectomy with Intraoperative Cholangiogram. A Time Out was held and the above information confirmed.  Prior to the induction of general anesthesia, antibiotic prophylaxis was administered. General endotracheal anesthesia was then administered and tolerated well. After the induction, the abdomen was prepped with Chloraprep and draped in the sterile fashion. The patient was positioned in the supine position.  Local anesthetic agent was injected into the skin near the umbilicus and an incision made. We dissected down to the abdominal fascia with blunt dissection.  The fascia was incised vertically and  we entered the peritoneal cavity bluntly.  A pursestring suture of 0-Vicryl was placed around the fascial opening.  The Hasson cannula was inserted and secured with the stay suture.  Pneumoperitoneum was then created with CO2 and tolerated well without any adverse changes in the patient's vital signs. An 11-mm port was placed in the subxiphoid position.  Two 5-mm ports were placed in the right upper quadrant. All skin incisions were infiltrated with a local anesthetic agent before making the incision and placing the trocars.   We positioned the patient in reverse Trendelenburg, tilted slightly to the patient's left.  The gallbladder was identified, the fundus grasped and retracted cephalad. There were some omental adhesions to the gallbladder.  Adhesions were lysed bluntly and with the electrocautery where indicated, taking care not to injure any adjacent organs or viscus. The infundibulum was grasped and retracted laterally, exposing the peritoneum overlying the triangle of Calot. This was then divided and exposed in a blunt fashion. A critical view of the cystic duct and cystic artery was obtained.  The cystic duct was clearly identified and bluntly dissected circumferentially. The cystic duct was ligated with a clip distally.   An incision was made in the cystic duct and the [facility] cholangiogram catheter introduced. The catheter was secured using a clip. A cholangiogram was then obtained which showed good visualization of the distal and proximal biliary tree with no sign of filling defects or obstruction.  Contrast flowed easily into the duodenum. The catheter was then removed.   The cystic duct was then ligated with clips and divided. The cystic artery was identified, dissected free, ligated with clips and divided as well.   The gallbladder was dissected from the liver bed in retrograde fashion with the electrocautery. The gallbladder was removed and placed in an Endocatch sac. The liver bed was irrigated  and inspected. Hemostasis was achieved with the  electrocautery. Copious irrigation was utilized and was repeatedly aspirated until clear.  The gallbladder and Endocatch sac were then removed through the umbilical port site.  The pursestring suture was used to close the umbilical fascia.    We again inspected the right upper quadrant for hemostasis.  Pneumoperitoneum was released as we removed the trocars.  4-0 Monocryl was used to close the skin.   Benzoin, steri-strips, and clean dressings were applied. The patient was then extubated and brought to the recovery room in stable condition. Instrument, sponge, and needle counts were correct at closure and at the conclusion of the case.   Findings: Cholecystitis with Cholelithiasis  Estimated Blood Loss: Minimal         Drains: none         Specimens: Gallbladder           Complications: None; patient tolerated the procedure well.         Disposition: PACU - hemodynamically stable.         Condition: stable

## 2015-06-03 NOTE — Anesthesia Preprocedure Evaluation (Signed)
Anesthesia Evaluation  Patient identified by MRN, date of birth, ID band Patient awake    Reviewed: Allergy & Precautions, NPO status , Patient's Chart, lab work & pertinent test results  History of Anesthesia Complications Negative for: history of anesthetic complications  Airway Mallampati: II  TM Distance: >3 FB Neck ROM: Full    Dental  (+) Teeth Intact   Pulmonary neg pulmonary ROS,  breath sounds clear to auscultation        Cardiovascular hypertension, Pt. on medications - angina- CHF Rhythm:Regular     Neuro/Psych Anxiety negative neurological ROS     GI/Hepatic Neg liver ROS, GERD-  ,  Endo/Other  diabetes, Type 2  Renal/GU negative Renal ROS     Musculoskeletal   Abdominal   Peds  Hematology   Anesthesia Other Findings   Reproductive/Obstetrics                             Anesthesia Physical Anesthesia Plan  ASA: II  Anesthesia Plan: General   Post-op Pain Management:    Induction: Intravenous  Airway Management Planned: Oral ETT  Additional Equipment: None  Intra-op Plan:   Post-operative Plan: Extubation in OR  Informed Consent: I have reviewed the patients History and Physical, chart, labs and discussed the procedure including the risks, benefits and alternatives for the proposed anesthesia with the patient or authorized representative who has indicated his/her understanding and acceptance.   Dental advisory given  Plan Discussed with: CRNA and Surgeon  Anesthesia Plan Comments:         Anesthesia Quick Evaluation

## 2015-06-03 NOTE — Discharge Instructions (Signed)
[facility] °LAPAROSCOPIC SURGERY: POST OP INSTRUCTIONS °Always review your discharge instruction sheet given to you by the facility where your surgery was performed. °IF YOU HAVE DISABILITY OR FAMILY LEAVE FORMS, YOU MUST BRING THEM TO THE OFFICE FOR PROCESSING.   °DO NOT GIVE THEM TO YOUR DOCTOR. ° °1. A prescription for pain medication will be given to you upon discharge.  Take your pain medication as prescribed, if needed.  If narcotic pain medicine is not needed, then you may take acetaminophen (Tylenol) or ibuprofen (Advil) as needed. °2. Take your usually prescribed medications unless otherwise directed. °3. If you need a refill on your pain medication, please contact your pharmacy.  They will contact our office to request authorization. Prescriptions will not be filled after 5pm or on week-ends. °4. You should follow a light diet the first few days after arrival home, such as soup and crackers, etc.  Be sure to include lots of fluids daily. °5. Most patients will experience some swelling and bruising in the area of the incisions.  Ice packs will help.  Swelling and bruising can take several days to resolve.  °6. It is common to experience some constipation if taking pain medication after surgery.  Increasing fluid intake and taking a stool softener (such as Colace) will usually help or prevent this problem from occurring.  A mild laxative (Milk of Magnesia or Miralax) should be taken according to package instructions if there are no bowel movements after 48 hours. °7. Unless discharge instructions indicate otherwise, you may remove your bandages 48 hours after surgery, and you may shower at that time.  You will have steri-strips (small skin tapes) in place directly over the incision.  These strips should be left on the skin for 7-10 days.  If your surgeon used skin glue on the incision, you may shower in 24 hours.  The glue will flake off over the next 2-3 weeks.  Any sutures or staples  will be removed at the office during your follow-up visit. °8. ACTIVITIES:  You may resume regular (light) daily activities beginning the next day--such as daily self-care, walking, climbing stairs--gradually increasing activities as tolerated.  You may have sexual intercourse when it is comfortable.  Refrain from any heavy lifting or straining until approved by your doctor. °a. You may drive when you are no longer taking prescription pain medication, you can comfortably wear a seatbelt, and you can safely maneuver your car and apply brakes. °b. RETURN TO WORK:   2-3 weeks °9. You should see your doctor in the office for a follow-up appointment approximately 2-3 weeks after your surgery.  Make sure that you call for this appointment within a day or two after you arrive home to insure a convenient appointment time. °10. OTHER INSTRUCTIONS: ________________________________________________________________________ °WHEN TO CALL YOUR DOCTOR: °1. Fever over 101.0 °2. Inability to urinate °3. Continued bleeding from incision. °4. Increased pain, redness, or drainage from the incision. °5. Increasing abdominal pain ° °The clinic staff is available to answer your questions during regular business hours.  Please don’t hesitate to call and ask to speak to one of the nurses for clinical concerns.  If you have a medical emergency, go to the nearest emergency room or call 911.  A surgeon from [facility] is always on call at the hospital. °[address], [address], [place], [place]  [address] ? [address] [address], [place], [place]   [address] °[phone] ? [phone] ? FAX [phone] °Web site:   [email or web address] ° °

## 2015-06-03 NOTE — Interval H&P Note (Signed)
History and Physical Interval Note:  06/03/2015 7:52 AM  Abigail [name]  has presented today for surgery, with the diagnosis of Choleycystitis  The various methods of treatment have been discussed with the patient and family. After consideration of risks, benefits and other options for treatment, the patient has consented to  Procedure(s): LAPAROSCOPIC CHOLECYSTECTOMY WITH INTRAOPERATIVE CHOLANGIOGRAM (N/A) as a surgical intervention .  The patient's history has been reviewed, patient examined, no change in status, stable for surgery.  I have reviewed the patient's chart and labs.  Questions were answered to the patient's satisfaction.     Abigail Malay K.

## 2015-06-04 ENCOUNTER — Encounter (HOSPITAL_COMMUNITY): Payer: Self-pay | Admitting: Surgery

## 2015-06-05 NOTE — Anesthesia Postprocedure Evaluation (Signed)
  Anesthesia Post-op Note  Patient: Abigail [name]  Procedure(s) Performed: Procedure(s): LAPAROSCOPIC CHOLECYSTECTOMY WITH INTRAOPERATIVE CHOLANGIOGRAM (N/A)  Patient Location: PACU  Anesthesia Type:General  Level of Consciousness: awake  Airway and Oxygen Therapy: Patient Spontanous Breathing  Post-op Pain: mild  Post-op Assessment: Post-op Vital signs reviewed, Patient's Cardiovascular Status Stable, Respiratory Function Stable, Patent Airway, No signs of Nausea or vomiting and Pain level controlled              Post-op Vital Signs: Reviewed and stable  Last Vitals:  Filed Vitals:   06/03/15 1336  BP: 133/73  Pulse: 92  Temp:   Resp: 16    Complications: No apparent anesthesia complications

## 2015-06-09 ENCOUNTER — Other Ambulatory Visit: Payer: Self-pay | Admitting: Endocrinology

## 2015-06-13 ENCOUNTER — Other Ambulatory Visit: Payer: Self-pay | Admitting: Family Medicine

## 2015-06-17 ENCOUNTER — Other Ambulatory Visit: Payer: Medicare Other

## 2015-06-22 ENCOUNTER — Ambulatory Visit: Payer: Medicare Other | Admitting: Endocrinology

## 2015-06-29 DIAGNOSIS — K8010 Calculus of gallbladder with chronic cholecystitis without obstruction: Secondary | ICD-10-CM | POA: Diagnosis not present

## 2015-07-01 ENCOUNTER — Other Ambulatory Visit: Payer: Self-pay | Admitting: Family Medicine

## 2015-07-09 ENCOUNTER — Other Ambulatory Visit (INDEPENDENT_AMBULATORY_CARE_PROVIDER_SITE_OTHER): Payer: Medicare Other

## 2015-07-09 DIAGNOSIS — E785 Hyperlipidemia, unspecified: Secondary | ICD-10-CM

## 2015-07-09 DIAGNOSIS — E119 Type 2 diabetes mellitus without complications: Secondary | ICD-10-CM | POA: Diagnosis not present

## 2015-07-09 LAB — COMPREHENSIVE METABOLIC PANEL
ALBUMIN: 4 g/dL (ref 3.5–5.2)
ALK PHOS: 73 U/L (ref 39–117)
ALT: 10 U/L (ref 0–35)
AST: 13 U/L (ref 0–37)
BUN: 16 mg/dL (ref 6–23)
CALCIUM: 8.9 mg/dL (ref 8.4–10.5)
CO2: 27 mEq/L (ref 19–32)
CREATININE: 1.09 mg/dL (ref 0.40–1.20)
Chloride: 100 mEq/L (ref 96–112)
GFR: 53.4 mL/min — ABNORMAL LOW (ref >60.00)
Glucose, Bld: 89 mg/dL (ref 70–99)
Potassium: 4.1 mEq/L (ref 3.5–5.1)
Sodium: 138 mEq/L (ref 135–145)
TOTAL PROTEIN: 6.7 g/dL (ref 6.0–8.3)
Total Bilirubin: 0.4 mg/dL (ref 0.2–1.2)

## 2015-07-09 LAB — LIPID PANEL
CHOLESTEROL: 131 mg/dL (ref 0–200)
HDL: 48 mg/dL (ref >39.00)
LDL CALC: 61 mg/dL (ref 0–99)
NonHDL: 82.56
TRIGLYCERIDES: 107 mg/dL (ref 0.0–149.0)
Total CHOL/HDL Ratio: 3
VLDL: 21.4 mg/dL (ref 0.0–40.0)

## 2015-07-09 LAB — HEMOGLOBIN A1C: Hgb A1c MFr Bld: 5.8 % (ref 4.6–6.5)

## 2015-07-09 LAB — MICROALBUMIN / CREATININE URINE RATIO
CREATININE, U: 212.4 mg/dL
MICROALB UR: 2.6 mg/dL — AB (ref 0.0–1.9)
MICROALB/CREAT RATIO: 1.2 mg/g (ref 0.0–30.0)

## 2015-07-15 ENCOUNTER — Ambulatory Visit (INDEPENDENT_AMBULATORY_CARE_PROVIDER_SITE_OTHER): Payer: Medicare Other | Admitting: Endocrinology

## 2015-07-15 ENCOUNTER — Encounter: Payer: Self-pay | Admitting: Endocrinology

## 2015-07-15 VITALS — BP 122/72 | HR 110 | Temp 98.0°F | Resp 14 | Ht 65.0 in | Wt 137.6 lb

## 2015-07-15 DIAGNOSIS — E119 Type 2 diabetes mellitus without complications: Secondary | ICD-10-CM | POA: Diagnosis not present

## 2015-07-15 DIAGNOSIS — E785 Hyperlipidemia, unspecified: Secondary | ICD-10-CM

## 2015-07-15 NOTE — Patient Instructions (Signed)
Check blood sugars on waking up .[name]2-3  .[name] times a week  Also check blood sugars about 2 hours after a meal and do this after different meals by rotation  Recommended blood sugar levels on waking up is 80-120 and about 2 hours after meal is 120-160 Please bring blood sugar monitor to each visit.  Stop Glipizide

## 2015-07-15 NOTE — Progress Notes (Signed)
Patient ID: Abigail [name], female   DOB: 1950-11-11, 64 y.o.   MRN: [id number]   Reason for Appointment : Followup for Type 2 Diabetes  History of Present Illness          Diagnosis: Type 2 diabetes mellitus, date of diagnosis: 2004 ?        Past history: She initially had nonspecific symptoms of shakiness at diagnosis and had only mild hyperglycemia. Subsequently was started on metformin low doses for treatment The dose of metformin was gradually increased and she thinks she has been taking 1000 mg twice a day for at least a year. She did have nausea with increasing the dose; however this is better now although not resolved. She probably tried Byetta several years ago but she believes it did not help her control. She has been managed with metformin alone for the last year and a half but blood sugars have been progressively higher.  Blood sugars prior to her initial consultation were mostly over 200, up to 315 and her A1c was 10.3%  Recent history:     Her blood sugars had improved with adding Victoza  Since 9/14.  She is using 1.2 mg without side effects  Her weight had gone down initially and also recently has lost weight again However has not lost any more weight recently Her fasting readings had been higher and these are better controlled with glipizide.  On her last visit she was told to take only half of the 5 mg dosage Because of her gallbladder issues she has not been able to exercise recently  She has fairly stable blood sugars although somewhat higher at times; usually postprandially readings are fairly good Her A1c continues to be upper normal  Side effects from medications: She did have nausea with Amaryl  Glucose monitoring:  one or more times a day      Glucometer:  One Touch Verio Blood Glucose readings from download recently:   Mean values apply above for all meters except median for One Touch  PRE-MEAL Fasting Lunch Dinner Bedtime Overall  Glucose  range: 78-161 119-193  96-154 78-193  Mean/median: 112    130    Hypoglycemia: None      Self-care: The diet that the patient has been following JI:RCVEL to limit fat intake, portions and sweets. Will have fruit for snacks.  Eating out 2-3 times a week, rarely fast food    Exercise: Walking  very little recently      Dietician visit: Most recent: Years ago. Has seen nurse educator.          Retinal exam:  annual She has had a weight range of 168-185 in the last few years  Wt Readings from Last 3 Encounters:  07/15/15 137 lb 9.6 oz (62.415 kg)  06/03/15 141 lb 12 oz (64.297 kg)  06/01/15 141 lb 12.1 oz (64.3 kg)  . LABS:  Lab Results  Component Value Date   HGBA1C 5.8 07/09/2015   HGBA1C 6.0* 06/01/2015   HGBA1C 5.9 02/17/2015   Lab Results  Component Value Date   MICROALBUR 2.6* 07/09/2015   LDLCALC 61 07/09/2015   CREATININE 1.09 07/09/2015       Medication List       This list is accurate as of: 07/15/15  2:24 PM.  Always use your most recent med list.               aspirin EC 325 MG tablet  Take 1 tablet (325  mg total) by mouth 2 (two) times daily.     atorvastatin 20 MG tablet  Commonly known as:  LIPITOR  Take 1 tablet (20 mg total) by mouth daily.     FLUoxetine 20 MG capsule  Commonly known as:  PROZAC  Take 1 capsule (20 mg total) by mouth daily.     glipiZIDE 5 MG tablet  Commonly known as:  GLUCOTROL  TAKE 1 TABLET BY MOUTH EVERY NIGHT AT BEDTIME     Liraglutide 18 MG/3ML Sopn  Commonly known as:  VICTOZA  INJECT 1.2 MG INTO THE SKIN. Convert to 3 month supply with 4RF if allowed by insurance     LORazepam 0.5 MG tablet  Commonly known as:  ATIVAN  Take 0.5 mg by mouth daily as needed for anxiety.     losartan 25 MG tablet  Commonly known as:  COZAAR  TAKE 1 TABLET BY MOUTH EVERY DAY     metFORMIN 1000 MG tablet  Commonly known as:  GLUCOPHAGE  TAKE 1 TABLET BY MOUTH TWICE DAILY WITH A MEAL     methocarbamol 750 MG tablet  Commonly  known as:  ROBAXIN  Take 1 tablet (750 mg total) by mouth 2 (two) times daily as needed for muscle spasms.     omeprazole 20 MG capsule  Commonly known as:  PRILOSEC  Take 20 mg by mouth daily.     ONETOUCH VERIO test strip  Generic drug:  glucose blood  TEST BLOOD SUGARS TWICE DAILY.     oxyCODONE-acetaminophen 5-325 MG per tablet  Commonly known as:  PERCOCET/ROXICET  Take 1 tablet by mouth every 4 (four) hours as needed for severe pain.     traZODone 150 MG tablet  Commonly known as:  DESYREL  Take 1 tablet (150 mg total) by mouth at bedtime.        Allergies:  Allergies  Allergen Reactions  . Amaryl [Glimepiride] Nausea Only  . Cephalexin Hives  . Sulfa Antibiotics Other (See Comments)    Unknown by pt    Past Medical History  Diagnosis Date  . Hypertension   . Hyperlipidemia   . Anxiety   . GERD (gastroesophageal reflux disease)   . Diabetes mellitus     Type 2  . Hematuria     Past Surgical History  Procedure Laterality Date  . Ovarian cyst removal    . Hammer toe surgery    . Colonoscopy    . Orif ankle fracture Left 11/17/2014    Procedure: OPEN REDUCTION INTERNAL FIXATION (ORIF) LEFT TRIMALLEOLAR ANKLE FRACTURE;  Surgeon: Marianna Payment, MD;  Location: [facility];  Service: Orthopedics;  Laterality: Left;  . Fracture surgery    . Cholecystectomy N/A 06/03/2015    Procedure: LAPAROSCOPIC CHOLECYSTECTOMY WITH INTRAOPERATIVE CHOLANGIOGRAM;  Surgeon: Donnie Mesa, MD;  Location: [facility] OR;  Service: General;  Laterality: N/A;    Family History  Problem Relation Age of Onset  . Diabetes Mother   . Diabetes Father   . Heart disease Father 108    AMI x 2; first AMI age 55  . Heart disease Brother     defibrillator; CHF    Social History:  reports that she has never smoked. She has never used smokeless tobacco. She reports that she drinks about 0.6 oz of alcohol per week. She reports that she does not use illicit drugs.    Review of Systems       Lipids:   on treatment with Lipitor for years, LDL  is  excellent    Lab Results  Component Value Date   CHOL 131 07/09/2015   HDL 48.00 07/09/2015   LDLCALC 61 07/09/2015   TRIG 107.0 07/09/2015   CHOLHDL 3 07/09/2015       The blood pressure has been treated with 25 mg Cozaar for "kidney protection".  She has periodic high readings, mostly in the office  Last diabetic foot exam was in 10/2014   LABS:  Appointment on 07/09/2015  Component Date Value Ref Range Status  . Hgb A1c MFr Bld 07/09/2015 5.8  4.6 - 6.5 % Final   Glycemic Control Guidelines for People with Diabetes:Non Diabetic:  <6%Goal of Therapy: <7%Additional Action Suggested:  >8%   . Sodium 07/09/2015 138  135 - 145 mEq/L Final  . Potassium 07/09/2015 4.1  3.5 - 5.1 mEq/L Final  . Chloride 07/09/2015 100  96 - 112 mEq/L Final  . CO2 07/09/2015 27  19 - 32 mEq/L Final  . Glucose, Bld 07/09/2015 89  70 - 99 mg/dL Final  . BUN 07/09/2015 16  6 - 23 mg/dL Final  . Creatinine, Ser 07/09/2015 1.09  0.40 - 1.20 mg/dL Final  . Total Bilirubin 07/09/2015 0.4  0.2 - 1.2 mg/dL Final  . Alkaline Phosphatase 07/09/2015 73  39 - 117 U/L Final  . AST 07/09/2015 13  0 - 37 U/L Final  . ALT 07/09/2015 10  0 - 35 U/L Final  . Total Protein 07/09/2015 6.7  6.0 - 8.3 g/dL Final  . Albumin 07/09/2015 4.0  3.5 - 5.2 g/dL Final  . Calcium 07/09/2015 8.9  8.4 - 10.5 mg/dL Final  . GFR 07/09/2015 53.40* >60.00 mL/min Final  . Cholesterol 07/09/2015 131  0 - 200 mg/dL Final   ATP III Classification       Desirable:  < 200 mg/dL               Borderline High:  200 - 239 mg/dL          High:  > = 240 mg/dL  . Triglycerides 07/09/2015 107.0  0.0 - 149.0 mg/dL Final   Normal:  <150 mg/dLBorderline High:  150 - 199 mg/dL  . HDL 07/09/2015 48.00  >39.00 mg/dL Final  . VLDL 07/09/2015 21.4  0.0 - 40.0 mg/dL Final  . LDL Cholesterol 07/09/2015 61  0 - 99 mg/dL Final  . Total CHOL/HDL Ratio 07/09/2015 3   Final                  Men          Women1/2  Average Risk     3.4          3.3Average Risk          5.0          4.42X Average Risk          9.6          7.13X Average Risk          15.0          11.0                      . NonHDL 07/09/2015 82.56   Final   NOTE:  Non-HDL goal should be 30 mg/dL higher than patient's LDL goal (i.e. LDL goal of < 70 mg/dL, would have non-HDL goal of < 100 mg/dL)  . Microalb, Ur 07/09/2015 2.6* 0.0 - 1.9 mg/dL Final  . Creatinine,U  07/09/2015 212.4   Final  . Microalb Creat Ratio 07/09/2015 1.2  0.0 - 30.0 mg/g Final    Physical Examination:  BP 122/72 mmHg  Pulse 110  Temp(Src) 98 F (36.7 C)  Resp 14  Ht 5\' 5"  (1.651 m)  Wt 137 lb 9.6 oz (62.415 kg)  BMI 22.90 kg/m2  SpO2 97%    No ankle edema  ASSESSMENT/PLAN:  Diabetes type 2, uncontrolled   Her diabetes is continuing to be well controlled with her regimen of metformin, Victoza and 2.5 mg glipizide at bedtime Overall has been fairly compliant with diet and has lost weight partly because of intercurrent illnesses She will be able to start exercising once she recovers from her recent surgery.  Since her fasting readings are still relatively low she can stop glipizide and continue to monitor readings She can only check her blood sugar once a day on an average instead of twice a day   HYPERLIPIDEMIA: Well controlled  Patient Instructions  Check blood sugars on waking up .[name]2-3  .[name] times a week  Also check blood sugars about 2 hours after a meal and do this after different meals by rotation  Recommended blood sugar levels on waking up is 80-120 and about 2 hours after meal is 120-160 Please bring blood sugar monitor to each visit.  Stop Glipizide     Khaleah Duer 07/15/2015, 2:24 PM

## 2015-08-05 ENCOUNTER — Ambulatory Visit (INDEPENDENT_AMBULATORY_CARE_PROVIDER_SITE_OTHER): Payer: Medicare Other | Admitting: Family Medicine

## 2015-08-05 ENCOUNTER — Encounter: Payer: Self-pay | Admitting: Family Medicine

## 2015-08-05 VITALS — BP 115/73 | HR 96 | Temp 98.4°F | Resp 16 | Ht 65.0 in | Wt 139.0 lb

## 2015-08-05 DIAGNOSIS — N390 Urinary tract infection, site not specified: Secondary | ICD-10-CM | POA: Diagnosis not present

## 2015-08-05 DIAGNOSIS — Z119 Encounter for screening for infectious and parasitic diseases, unspecified: Secondary | ICD-10-CM

## 2015-08-05 DIAGNOSIS — R3129 Other microscopic hematuria: Secondary | ICD-10-CM

## 2015-08-05 DIAGNOSIS — Z23 Encounter for immunization: Secondary | ICD-10-CM | POA: Diagnosis not present

## 2015-08-05 DIAGNOSIS — Z87898 Personal history of other specified conditions: Secondary | ICD-10-CM

## 2015-08-05 DIAGNOSIS — I10 Essential (primary) hypertension: Secondary | ICD-10-CM | POA: Diagnosis not present

## 2015-08-05 DIAGNOSIS — Z0000 Encounter for general adult medical examination without abnormal findings: Secondary | ICD-10-CM | POA: Diagnosis not present

## 2015-08-05 DIAGNOSIS — N63 Unspecified lump in unspecified breast: Secondary | ICD-10-CM

## 2015-08-05 DIAGNOSIS — R748 Abnormal levels of other serum enzymes: Secondary | ICD-10-CM

## 2015-08-05 DIAGNOSIS — R8271 Bacteriuria: Secondary | ICD-10-CM

## 2015-08-05 DIAGNOSIS — Z9189 Other specified personal risk factors, not elsewhere classified: Secondary | ICD-10-CM

## 2015-08-05 LAB — POCT URINALYSIS DIPSTICK
Bilirubin, UA: NEGATIVE
Glucose, UA: NEGATIVE
Ketones, UA: NEGATIVE
Leukocytes, UA: NEGATIVE
NITRITE UA: NEGATIVE
PH UA: 5.5
PROTEIN UA: NEGATIVE
Spec Grav, UA: 1.02
UROBILINOGEN UA: 0.2

## 2015-08-05 LAB — POCT UA - MICROSCOPIC ONLY
CRYSTALS, UR, HPF, POC: NEGATIVE
Casts, Ur, LPF, POC: NEGATIVE
YEAST UA: NEGATIVE

## 2015-08-05 LAB — HIV ANTIBODY (ROUTINE TESTING W REFLEX): HIV 1&2 Ab, 4th Generation: NONREACTIVE

## 2015-08-05 LAB — LIPASE: LIPASE: 66 U/L — AB (ref 7–60)

## 2015-08-05 LAB — AMYLASE: AMYLASE: 49 U/L (ref 0–105)

## 2015-08-05 LAB — HEPATITIS C ANTIBODY: HCV Ab: NEGATIVE

## 2015-08-05 MED ORDER — LOSARTAN POTASSIUM 25 MG PO TABS: 25.0000 mg | ORAL_TABLET | Freq: Every day | ORAL | Status: DC

## 2015-08-05 NOTE — Progress Notes (Addendum)
[facility] [address], [place] [address] [phone]  Date:  08/05/2015   Name:  Abigail [name]   DOB:  [birth year]   MRN:  [id number]  PCP:  Lamar Blinks, MD    Chief Complaint: No chief complaint on file.   History of Present Illness:  Abigail [name] is a 64 y.o. very pleasant female patient who presents with the following:  Here today for a follow-up visit.  Dr. Dwyane Dee is managing her DM2. She is on victoza and 2.5 mg of glipizide.   Most recent A1c looked great.  She just had a lap chole in July which went well.  She has lost some weight, partially due to recent surgery.  She still has some occasional diarrhea but is getting better.   She wants to make sure that her pancreas is ok- she has some trouble digesting fatty foods like pizza.  Her pain is resolved however.   She noted a little nodule in her left chest wall- this has been there for about one month.  It is non-tender. She thinks it is is likely rib but wants to make sure no breast concern Last mammo a year ago.  Negative pap 2 years ago  She is having her eye exam soon- this is overdue. She is sleeping well, she takes trazodone at bedtime.  She does take ativan on rare occasion- these are left over from her ankle surgery last year, she does not need more Stable on prozac 20  Wt Readings from Last 3 Encounters:  07/15/15 137 lb 9.6 oz (62.415 kg)  06/03/15 141 lb 12 oz (64.297 kg)  06/01/15 141 lb 12.1 oz (64.3 kg)    Lab Results  Component Value Date   HGBA1C 5.8 07/09/2015     Patient Active Problem List   Diagnosis Date Noted  . Trimalleolar fracture of ankle, closed 11/17/2014  . Other and unspecified hyperlipidemia 07/24/2013  . Chest pain 10/23/2012  . Type II or unspecified type diabetes mellitus without mention of complication, uncontrolled 10/08/2012  . Anxiety 10/08/2012    Past Medical History  Diagnosis Date  . Hypertension   . Hyperlipidemia   .  Anxiety   . GERD (gastroesophageal reflux disease)   . Diabetes mellitus     Type 2  . Hematuria     Past Surgical History  Procedure Laterality Date  . Ovarian cyst removal    . Hammer toe surgery    . Colonoscopy    . Orif ankle fracture Left 11/17/2014    Procedure: OPEN REDUCTION INTERNAL FIXATION (ORIF) LEFT TRIMALLEOLAR ANKLE FRACTURE;  Surgeon: Marianna Payment, MD;  Location: [place];  Service: Orthopedics;  Laterality: Left;  . Fracture surgery    . Cholecystectomy N/A 06/03/2015    Procedure: LAPAROSCOPIC CHOLECYSTECTOMY WITH INTRAOPERATIVE CHOLANGIOGRAM;  Surgeon: Donnie Mesa, MD;  Location: [place];  Service: General;  Laterality: N/A;    Social History  Substance Use Topics  . Smoking status: Never Smoker   . Smokeless tobacco: Never Used  . Alcohol Use: 0.6 oz/week    1 Glasses of wine per week    Family History  Problem Relation Age of Onset  . Diabetes Mother   . Diabetes Father   . Heart disease Father 29    AMI x 2; first AMI age 45  . Heart disease Brother     defibrillator; CHF    Allergies  Allergen Reactions  . Amaryl [Glimepiride] Nausea Only  .  Cephalexin Hives  . Sulfa Antibiotics Other (See Comments)    Unknown by pt    Medication list has been reviewed and updated.  Current Outpatient Prescriptions on File Prior to Visit  Medication Sig Dispense Refill  . aspirin EC 325 MG tablet Take 1 tablet (325 mg total) by mouth 2 (two) times daily. (Patient taking differently: Take 325 mg by mouth daily as needed for moderate pain. ) 84 tablet 0  . atorvastatin (LIPITOR) 20 MG tablet Take 1 tablet (20 mg total) by mouth daily. 90 tablet 3  . FLUoxetine (PROZAC) 20 MG capsule Take 1 capsule (20 mg total) by mouth daily. 90 capsule 3  . Liraglutide (VICTOZA) 18 MG/3ML SOPN INJECT 1.2 MG INTO THE SKIN. Convert to 3 month supply with 4RF if allowed by insurance 6 mL 3  . LORazepam (ATIVAN) 0.5 MG tablet Take 0.5 mg by mouth daily as needed for anxiety.      [name] losartan (COZAAR) 25 MG tablet TAKE 1 TABLET BY MOUTH EVERY DAY 90 tablet 0  . metFORMIN (GLUCOPHAGE) 1000 MG tablet TAKE 1 TABLET BY MOUTH TWICE DAILY WITH A MEAL (Patient taking differently: Take 1,000 mg by mouth 2 (two) times daily with a meal. ) 180 tablet 3  . omeprazole (PRILOSEC) 20 MG capsule Take 20 mg by mouth daily.    Glory Rosebush VERIO test strip TEST BLOOD SUGARS TWICE DAILY. 100 each 3  . oxyCODONE-acetaminophen (PERCOCET/ROXICET) 5-325 MG per tablet Take 1 tablet by mouth every 4 (four) hours as needed for severe pain. 40 tablet 0  . traZODone (DESYREL) 150 MG tablet Take 1 tablet (150 mg total) by mouth at bedtime. 90 tablet 3   No current facility-administered medications on file prior to visit.    Review of Systems:  As per HPI- otherwise negative.   Physical Examination: BP 115/73 mmHg  Pulse 96  Temp(Src) 98.4 F (36.9 C) (Oral)  Resp 16  Ht 5\' 5"  (1.651 m)  Wt 139 lb (63.05 kg)  BMI 23.13 kg/m2   GEN: WDWN, NAD, Non-toxic, A & O x 3, looks well, nicely dressed and groomed as always HEENT: Atraumatic, Normocephalic. Neck supple. No masses, No LAD.  Bilateral TM wnl, oropharynx normal.  PEERL,EOMI.   Ears and Nose: No external deformity. CV: RRR, No M/G/R. No JVD. No thrill. No extra heart sounds. PULM: CTA B, no wheezes, crackles, rhonchi. No retractions. No resp. distress. No accessory muscle use. ABD: S, NT, ND, +BS. No rebound. No HSM. EXTR: No c/c/e NEURO Normal gait.  PSYCH: Normally interactive. Conversant. Not depressed or anxious appearing.  Calm demeanor.  Breast: normal exam without definite nodule noted, no dimpling or discharge.  She notes an area of concern at 9:00 on her left breast- suspect this is rib palpable through her breast tissue  Assessment and Plan: Physical exam  Influenza vaccine needed - Plan: Flu Vaccine QUAD 36+ mos IM  Breast mass in female - Plan: MM Digital Diagnostic Unilat L  Screening examination for infectious  disease - Plan: Hepatitis C antibody, HIV antibody  Benign essential HTN - Plan: losartan (COZAAR) 25 MG tablet  At risk for impaired digestion - Plan: Amylase, Lipase  Will check pancreatic enzymes today for her- she is worried about this Screening for Hep C and HIV Flu shot Refilled her BP medication Will refer for diagnostic mammo but do not strongly suspect a breast problem  Signed Lamar Blinks, MD  Results for orders placed or performed in visit on  08/05/15  POCT UA - Microscopic Only  Result Value Ref Range   WBC, Ur, HPF, POC none    RBC, urine, microscopic few    Bacteria, U Microscopic few    Mucus, UA present    Epithelial cells, urine per micros none    Crystals, Ur, HPF, POC neg    Casts, Ur, LPF, POC neg    Yeast, UA neg

## 2015-08-05 NOTE — Patient Instructions (Signed)
Great to see you today! You can get your shingles vaccine at your convenience.  I will be in touch with your labs We will arrange a diagnostic mammogram to look at the area of concern in your left breast Please see me in about 6 months

## 2015-08-06 ENCOUNTER — Telehealth: Payer: Self-pay

## 2015-08-06 DIAGNOSIS — N63 Unspecified lump in unspecified breast: Secondary | ICD-10-CM

## 2015-08-06 NOTE — Addendum Note (Signed)
Addended by: Lamar Blinks C on: 08/06/2015 04:47 PM   Modules accepted: Orders

## 2015-08-06 NOTE — Telephone Encounter (Signed)
[facility] called needing the position of the the mass. Spoke with Dr. Lorelei Pont and the area is at 9 o clock

## 2015-08-13 ENCOUNTER — Telehealth: Payer: Self-pay

## 2015-08-13 ENCOUNTER — Other Ambulatory Visit: Payer: Self-pay | Admitting: Family Medicine

## 2015-08-13 ENCOUNTER — Ambulatory Visit
Admission: RE | Admit: 2015-08-13 | Discharge: 2015-08-13 | Disposition: A | Discharge status: Home / Self Care | Payer: Medicare Other | Source: Ambulatory Visit | Attending: Family Medicine | Admitting: Family Medicine

## 2015-08-13 DIAGNOSIS — N63 Unspecified lump in unspecified breast: Secondary | ICD-10-CM

## 2015-08-13 DIAGNOSIS — R928 Other abnormal and inconclusive findings on diagnostic imaging of breast: Secondary | ICD-10-CM | POA: Diagnosis not present

## 2015-08-13 DIAGNOSIS — N6489 Other specified disorders of breast: Secondary | ICD-10-CM | POA: Diagnosis not present

## 2015-08-13 NOTE — Telephone Encounter (Signed)
Pt is needing to talk with dr copland about the referral she made for patient she does not know why

## 2015-08-13 NOTE — Telephone Encounter (Signed)
Procedure Modifiers Provider Requested Approved    [phone] - Ambulatory referral to Urology   1 1        Diagnosis Information     Diagnosis   R31.2 (ICD-10-CM) - Microhematuria

## 2015-08-14 NOTE — Telephone Encounter (Signed)
Spoke with pt, advised it is a urology referral for the hematuria.

## 2015-09-14 DIAGNOSIS — R351 Nocturia: Secondary | ICD-10-CM | POA: Diagnosis not present

## 2015-09-14 DIAGNOSIS — R3129 Other microscopic hematuria: Secondary | ICD-10-CM | POA: Diagnosis not present

## 2015-09-16 ENCOUNTER — Encounter: Payer: Self-pay | Admitting: Family Medicine

## 2015-09-16 DIAGNOSIS — R3129 Other microscopic hematuria: Secondary | ICD-10-CM | POA: Insufficient documentation

## 2015-11-10 ENCOUNTER — Other Ambulatory Visit: Payer: Medicare Other

## 2015-11-13 ENCOUNTER — Other Ambulatory Visit: Payer: Self-pay | Admitting: Family Medicine

## 2015-11-13 ENCOUNTER — Ambulatory Visit: Payer: Medicare Other | Admitting: Endocrinology

## 2015-11-27 ENCOUNTER — Encounter: Payer: Self-pay | Admitting: Family Medicine

## 2015-12-02 ENCOUNTER — Encounter: Payer: Self-pay | Admitting: Family Medicine

## 2015-12-02 DIAGNOSIS — Z6823 Body mass index (BMI) 23.0-23.9, adult: Secondary | ICD-10-CM | POA: Diagnosis not present

## 2015-12-02 DIAGNOSIS — Z01419 Encounter for gynecological examination (general) (routine) without abnormal findings: Secondary | ICD-10-CM | POA: Diagnosis not present

## 2015-12-02 DIAGNOSIS — Z1212 Encounter for screening for malignant neoplasm of rectum: Secondary | ICD-10-CM | POA: Diagnosis not present

## 2015-12-03 ENCOUNTER — Other Ambulatory Visit (INDEPENDENT_AMBULATORY_CARE_PROVIDER_SITE_OTHER): Payer: Medicare Other

## 2015-12-03 DIAGNOSIS — E119 Type 2 diabetes mellitus without complications: Secondary | ICD-10-CM | POA: Diagnosis not present

## 2015-12-03 LAB — COMPREHENSIVE METABOLIC PANEL
ALT: 9 U/L (ref 0–35)
AST: 13 U/L (ref 0–37)
Albumin: 4.1 g/dL (ref 3.5–5.2)
Alkaline Phosphatase: 61 U/L (ref 39–117)
BUN: 13 mg/dL (ref 6–23)
CO2: 28 meq/L (ref 19–32)
Calcium: 8.9 mg/dL (ref 8.4–10.5)
Chloride: 98 mEq/L (ref 96–112)
Creatinine, Ser: 1.01 mg/dL (ref 0.40–1.20)
GFR: 58.24 mL/min — AB (ref >60.00)
GLUCOSE: 113 mg/dL — AB (ref 70–99)
Potassium: 4.2 mEq/L (ref 3.5–5.1)
Sodium: 136 mEq/L (ref 135–145)
Total Bilirubin: 0.2 mg/dL (ref 0.2–1.2)
Total Protein: 6.5 g/dL (ref 6.0–8.3)

## 2015-12-03 LAB — HEMOGLOBIN A1C: HEMOGLOBIN A1C: 5.8 % (ref 4.6–6.5)

## 2015-12-07 ENCOUNTER — Encounter: Payer: Self-pay | Admitting: Endocrinology

## 2015-12-07 ENCOUNTER — Ambulatory Visit (INDEPENDENT_AMBULATORY_CARE_PROVIDER_SITE_OTHER): Payer: Medicare Other | Admitting: Endocrinology

## 2015-12-07 ENCOUNTER — Other Ambulatory Visit: Payer: Self-pay | Admitting: Family Medicine

## 2015-12-07 VITALS — BP 110/68 | HR 122 | Temp 97.7°F | Resp 14 | Ht 65.0 in | Wt 134.8 lb

## 2015-12-07 DIAGNOSIS — E119 Type 2 diabetes mellitus without complications: Secondary | ICD-10-CM | POA: Diagnosis not present

## 2015-12-07 NOTE — Progress Notes (Signed)
Patient ID: Abigail [name], female   DOB: Oct 29, 1951, 65 y.o.   MRN: [id number]   Reason for Appointment : Followup for Type 2 Diabetes  History of Present Illness          Diagnosis: Type 2 diabetes mellitus, date of diagnosis: 2004 ?        Past history: She initially had nonspecific symptoms of shakiness at diagnosis and had only mild hyperglycemia. Subsequently was started on metformin low doses for treatment The dose of metformin was gradually increased and she thinks she has been taking 1000 mg twice a day for at least a year. She did have nausea with increasing the dose; however this is better now although not resolved. She probably tried Byetta several years ago but she believes it did not help her control. She has been managed with metformin alone for the last year and a half but blood sugars have been progressively higher.  Blood sugars prior to her initial consultation were mostly over 200, up to 315 and her A1c was 10.3%  Recent history:     Her blood sugars had improved with adding Victoza  Since 9/14.  She is using 1.2 mg without side effects On her last visit in 9/16 her glipizide was stopped because of low normal readings fasting  Current blood sugar patterns and management, problems as follows:  Although her A1c is excellent at 5.8 again she appears to have some unexpected high readings at home especially at lunchtime.  Also has only a couple of readings in the evenings after supper which are high  She is now using a One Touch ultra II monitor which she thinks is a year old from a family member and using test strips that are within date but are not the ultra blue test strips  Her weight has gone down although she thinks part of it is from diarrhea  She has been eating small amounts mostly but does not always have protein at breakfast  She has only recently started exercising  Side effects from medications: She did have nausea with Amaryl  Glucose  monitoring:  one or more times a day      Glucometer:  One Touch ultra Blood Glucose readings from download recently:   Mean values apply above for all meters except median for One Touch  PRE-MEAL Fasting Lunch Dinner Bedtime Overall  Glucose range:  111-138   98-240   102-172   172, 180    Mean/median:   149     144+/-137     Hypoglycemia: None      Self-care: The diet that the patient has been following [id number] to limit fat intake, portions and sweets. Will have fruit for snacks.  Eating out 2-3 times a week, rarely fast food;     Exercise: Walking now   Dietician visit: Most recent: Years ago. Has seen nurse educator.          Retinal exam:  annual She has had a weight range of 168-185 in the last few years  Wt Readings from Last 3 Encounters:  12/07/15 134 lb 12.8 oz (61.145 kg)  08/05/15 139 lb (63.05 kg)  07/15/15 137 lb 9.6 oz (62.415 kg)  . LABS:  Lab Results  Component Value Date   HGBA1C 5.8 12/03/2015   HGBA1C 5.8 07/09/2015   HGBA1C 6.0* 06/01/2015   Lab Results  Component Value Date   MICROALBUR 2.6* 07/09/2015   LDLCALC 61 07/09/2015   CREATININE 1.01 12/03/2015  Medication List       This list is accurate as of: 12/07/15  5:02 PM.  Always use your most recent med list.               aspirin EC 325 MG tablet  Take 1 tablet (325 mg total) by mouth 2 (two) times daily.     atorvastatin 20 MG tablet  Commonly known as:  LIPITOR  Take 1 tablet (20 mg total) by mouth daily.     buPROPion 150 MG 12 hr tablet  Commonly known as:  WELLBUTRIN SR     FLUoxetine 20 MG capsule  Commonly known as:  PROZAC  Take 1 capsule (20 mg total) by mouth daily.     Liraglutide 18 MG/3ML Sopn  Commonly known as:  VICTOZA  INJECT 1.2 MG INTO THE SKIN. Convert to 3 month supply with 4RF if allowed by insurance     LORazepam 0.5 MG tablet  Commonly known as:  ATIVAN  Take 0.5 mg by mouth daily as needed for anxiety.     losartan 25 MG tablet  Commonly  known as:  COZAAR  Take 1 tablet (25 mg total) by mouth daily.     metFORMIN 1000 MG tablet  Commonly known as:  GLUCOPHAGE  TAKE 1 TABLET BY MOUTH TWICE DAILY WITH A MEAL     omeprazole 20 MG capsule  Commonly known as:  PRILOSEC  Take 20 mg by mouth daily.     ONETOUCH VERIO test strip  Generic drug:  glucose blood  TEST BLOOD SUGARS TWICE DAILY.     traZODone 150 MG tablet  Commonly known as:  DESYREL  Take 1 tablet (150 mg total) by mouth at bedtime.        Allergies:  Allergies  Allergen Reactions  . Amaryl [Glimepiride] Nausea Only  . Cephalexin Hives  . Sulfa Antibiotics Other (See Comments)    Unknown by pt    Past Medical History  Diagnosis Date  . Hypertension   . Hyperlipidemia   . Anxiety   . GERD (gastroesophageal reflux disease)   . Diabetes mellitus     Type 2  . Hematuria   . Depression     Past Surgical History  Procedure Laterality Date  . Ovarian cyst removal    . Hammer toe surgery    . Colonoscopy    . Orif ankle fracture Left 11/17/2014    Procedure: OPEN REDUCTION INTERNAL FIXATION (ORIF) LEFT TRIMALLEOLAR ANKLE FRACTURE;  Surgeon: Marianna Payment, MD;  Location: [facility];  Service: Orthopedics;  Laterality: Left;  . Fracture surgery    . Cholecystectomy N/A 06/03/2015    Procedure: LAPAROSCOPIC CHOLECYSTECTOMY WITH INTRAOPERATIVE CHOLANGIOGRAM;  Surgeon: Donnie Mesa, MD;  Location[facility];  Service: General;  Laterality: N/A;    Family History  Problem Relation Age of Onset  . Diabetes Mother   . Hyperlipidemia Mother   . Diabetes Father   . Heart disease Father 51    AMI x 2; first AMI age 54  . Stroke Father   . Heart disease Brother     defibrillator; CHF  . Hyperlipidemia Brother     Social History:  reports that she has never smoked. She has never used smokeless tobacco. She reports that she drinks about 0.6 oz of alcohol per week. She reports that she does not use illicit drugs.    Review of Systems       Lipids:   on treatment with Lipitor for  years, LDL is  excellent    Lab Results  Component Value Date   CHOL 131 07/09/2015   HDL 48.00 07/09/2015   LDLCALC 61 07/09/2015   TRIG 107.0 07/09/2015   CHOLHDL 3 07/09/2015       The blood pressure has been treated with 25 mg Cozaar for "kidney protection".   Last diabetic foot exam was in 10/2014   LABS:  Lab on 12/03/2015  Component Date Value Ref Range Status  . Hgb A1c MFr Bld 12/03/2015 5.8  4.6 - 6.5 % Final   Glycemic Control Guidelines for People with Diabetes:Non Diabetic:  <6%Goal of Therapy: <7%Additional Action Suggested:  >8%   . Sodium 12/03/2015 136  135 - 145 mEq/L Final  . Potassium 12/03/2015 4.2  3.5 - 5.1 mEq/L Final  . Chloride 12/03/2015 98  96 - 112 mEq/L Final  . CO2 12/03/2015 28  19 - 32 mEq/L Final  . Glucose, Bld 12/03/2015 113* 70 - 99 mg/dL Final  . BUN 12/03/2015 13  6 - 23 mg/dL Final  . Creatinine, Ser 12/03/2015 1.01  0.40 - 1.20 mg/dL Final  . Total Bilirubin 12/03/2015 0.2  0.2 - 1.2 mg/dL Final  . Alkaline Phosphatase 12/03/2015 61  39 - 117 U/L Final  . AST 12/03/2015 13  0 - 37 U/L Final  . ALT 12/03/2015 9  0 - 35 U/L Final  . Total Protein 12/03/2015 6.5  6.0 - 8.3 g/dL Final  . Albumin 12/03/2015 4.1  3.5 - 5.2 g/dL Final  . Calcium 12/03/2015 8.9  8.4 - 10.5 mg/dL Final  . GFR 12/03/2015 58.24* >60.00 mL/min Final    Physical Examination:  BP 110/68 mmHg  Pulse 122  Temp(Src) 97.7 F (36.5 C)  Resp 14  Ht 5\' 5"  (1.651 m)  Wt 134 lb 12.8 oz (61.145 kg)  BMI 22.43 kg/m2  SpO2 95%    Repeat pulse 108 regular No ankle edema  Diabetic Foot Exam - Simple   Simple Foot Form  Diabetic Foot exam was performed with the following findings:  Yes 12/07/2015  3:50 PM  Visual Inspection  No deformities, no ulcerations, no other skin breakdown bilaterally:  Yes  Sensation Testing  Intact to touch and monofilament testing bilaterally:  Yes  Pulse Check  Posterior Tibialis and Dorsalis pulse intact  bilaterally:  Yes  Comments       ASSESSMENT/PLAN:  Diabetes type 2, uncontrolled   Her diabetes is continuing to be well controlled with her regimen of metformin, Victoza Not clear why her blood sugars are fluctuating as discussed above Some of her high readings are around lunchtime but has not checked enough readings after meals overall Not clear if her monitor is accurate since she is using an older One Touch ultra monitor from a family member  She will go back to using her One Touch Verio monitor for now  Encouraged her to have a protein at meals especially breakfast and discussed options Continue regular exercise Follow-up in 3 months  Patient Instructions  Check blood sugars on waking up 2  times a week Also check blood sugars about 2 hours after a meal and do this after different meals by rotation  Recommended blood sugar levels on waking up is 90-130 and about 2 hours after meal is 130-160  Please bring your blood sugar monitor to each visit, thank you  Add protein to Breakfast daily       Kenaz Olafson 12/07/2015, 5:02 PM

## 2015-12-07 NOTE — Patient Instructions (Addendum)
Check blood sugars on waking up 2  times a week Also check blood sugars about 2 hours after a meal and do this after different meals by rotation  Recommended blood sugar levels on waking up is 90-130 and about 2 hours after meal is 130-160  Please bring your blood sugar monitor to each visit, thank you  Add protein to Breakfast daily

## 2015-12-08 DIAGNOSIS — L821 Other seborrheic keratosis: Secondary | ICD-10-CM | POA: Diagnosis not present

## 2015-12-08 DIAGNOSIS — L570 Actinic keratosis: Secondary | ICD-10-CM | POA: Diagnosis not present

## 2015-12-08 DIAGNOSIS — L814 Other melanin hyperpigmentation: Secondary | ICD-10-CM | POA: Diagnosis not present

## 2016-02-03 DIAGNOSIS — N958 Other specified menopausal and perimenopausal disorders: Secondary | ICD-10-CM | POA: Diagnosis not present

## 2016-02-10 ENCOUNTER — Ambulatory Visit (INDEPENDENT_AMBULATORY_CARE_PROVIDER_SITE_OTHER): Payer: Medicare Other | Admitting: Family Medicine

## 2016-02-10 ENCOUNTER — Encounter: Payer: Self-pay | Admitting: Family Medicine

## 2016-02-10 ENCOUNTER — Other Ambulatory Visit: Payer: Self-pay | Admitting: Family Medicine

## 2016-02-10 VITALS — BP 132/74 | HR 90 | Temp 97.7°F | Ht 65.0 in | Wt 137.2 lb

## 2016-02-10 DIAGNOSIS — I10 Essential (primary) hypertension: Secondary | ICD-10-CM

## 2016-02-10 DIAGNOSIS — E119 Type 2 diabetes mellitus without complications: Secondary | ICD-10-CM

## 2016-02-10 DIAGNOSIS — E785 Hyperlipidemia, unspecified: Secondary | ICD-10-CM | POA: Diagnosis not present

## 2016-02-10 DIAGNOSIS — Z23 Encounter for immunization: Secondary | ICD-10-CM | POA: Diagnosis not present

## 2016-02-10 DIAGNOSIS — F32A Depression, unspecified: Secondary | ICD-10-CM

## 2016-02-10 DIAGNOSIS — F329 Major depressive disorder, single episode, unspecified: Secondary | ICD-10-CM

## 2016-02-10 MED ORDER — METFORMIN HCL 1000 MG PO TABS: ORAL_TABLET | ORAL | Status: DC

## 2016-02-10 MED ORDER — ATORVASTATIN CALCIUM 20 MG PO TABS: 20.0000 mg | ORAL_TABLET | Freq: Every day | ORAL | Status: DC

## 2016-02-10 MED ORDER — OMEPRAZOLE 20 MG PO CPDR: 20.0000 mg | DELAYED_RELEASE_CAPSULE | Freq: Every day | ORAL | Status: DC

## 2016-02-10 MED ORDER — FLUOXETINE HCL 20 MG PO CAPS: 20.0000 mg | ORAL_CAPSULE | Freq: Every day | ORAL | Status: DC

## 2016-02-10 MED ORDER — LOSARTAN POTASSIUM 25 MG PO TABS: 25.0000 mg | ORAL_TABLET | Freq: Every day | ORAL | Status: DC

## 2016-02-10 MED ORDER — TRAZODONE HCL 150 MG PO TABS: 150.0000 mg | ORAL_TABLET | Freq: Every day | ORAL | Status: DC

## 2016-02-10 MED ORDER — LIRAGLUTIDE 18 MG/3ML ~~LOC~~ SOPN: PEN_INJECTOR | SUBCUTANEOUS | Status: DC

## 2016-02-10 NOTE — Patient Instructions (Signed)
You got your pneumovax pneumonia vaccine today. Take care and let me know if you need anything Your diabetes has looked good- if you want me to take over managing your diabetes it is ok with me.

## 2016-02-10 NOTE — Progress Notes (Addendum)
[facility] [address], [place], [place] [address] [phone]  Date:  02/10/2016   Name:  Abigail [name]   DOB:  [birth year]   MRN:  [id number]  PCP:  Lamar Blinks, MD    Chief Complaint: Medication Management   History of Present Illness:  Abigail [name] is a 65 y.o. very pleasant female patient who presents with the following:  History of DM, hyperlipidemia, anxiety. DM is well controlled  She is seen at [facility] in March She ate just a little bit of food  She does see Dr. Dwyane Dee for her DM management.  She sees him every 3 months. She is on metformin and victoza.  She did try wellbutrin recently "for my nerves" but it did not work well for her She does use ativan on occasion but does not need more  Needs her pneumovax today. She had the prevnar 13 in 2015, due for pneumovax today and she would like to go ahead and do this  Lab Results  Component Value Date   HGBA1C 5.8 12/03/2015   Wt Readings from Last 3 Encounters:  02/10/16 137 lb 3.2 oz (62.234 kg)  12/07/15 134 lb 12.8 oz (61.145 kg)  08/05/15 139 lb (63.05 kg)     Patient Active Problem List   Diagnosis Date Noted  . Microscopic hematuria 09/16/2015  . Trimalleolar fracture of ankle, closed 11/17/2014  . Other and unspecified hyperlipidemia 07/24/2013  . Chest pain 10/23/2012  . Type II or unspecified type diabetes mellitus without mention of complication, uncontrolled 10/08/2012  . Anxiety 10/08/2012    Past Medical History  Diagnosis Date  . Hypertension   . Hyperlipidemia   . Anxiety   . GERD (gastroesophageal reflux disease)   . Diabetes mellitus     Type 2  . Hematuria   . Depression     Past Surgical History  Procedure Laterality Date  . Ovarian cyst removal    . Hammer toe surgery    . Colonoscopy    . Orif ankle fracture Left 11/17/2014    Procedure: OPEN REDUCTION INTERNAL FIXATION (ORIF) LEFT  TRIMALLEOLAR ANKLE FRACTURE;  Surgeon: Marianna Payment, MD;  Location: [facility];  Service: Orthopedics;  Laterality: Left;  . Fracture surgery    . Cholecystectomy N/A 06/03/2015    Procedure: LAPAROSCOPIC CHOLECYSTECTOMY WITH INTRAOPERATIVE CHOLANGIOGRAM;  Surgeon: Donnie Mesa, MD;  Location: [facility];  Service: General;  Laterality: N/A;    Social History  Substance Use Topics  . Smoking status: Never Smoker   . Smokeless tobacco: Never Used  . Alcohol Use: 0.6 oz/week    1 Glasses of wine per week    Family History  Problem Relation Age of Onset  . Diabetes Mother   . Hyperlipidemia Mother   . Diabetes Father   . Heart disease Father 85    AMI x 2; first AMI age 46  . Stroke Father   . Heart disease Brother     defibrillator; CHF  . Hyperlipidemia Brother     Allergies  Allergen Reactions  . Amaryl [Glimepiride] Nausea Only  . Cephalexin Hives  . Sulfa Antibiotics Other (See Comments)    Unknown by pt    Medication list has been reviewed and updated.  Current Outpatient Prescriptions on File Prior to Visit  Medication Sig Dispense Refill  . aspirin EC 325 MG tablet Take 1 tablet (325 mg total) by mouth 2 (two) times  daily. (Patient taking differently: Take 325 mg by mouth daily as needed for moderate pain. ) 84 tablet 0  . atorvastatin (LIPITOR) 20 MG tablet Take 1 tablet (20 mg total) by mouth daily. 90 tablet 3  . buPROPion (WELLBUTRIN SR) 150 MG 12 hr tablet     . FLUoxetine (PROZAC) 20 MG capsule Take 1 capsule (20 mg total) by mouth daily. 90 capsule 3  . Liraglutide (VICTOZA) 18 MG/3ML SOPN INJECT 1.2 MG INTO THE SKIN. Convert to 3 month supply with 4RF if allowed by insurance 6 mL 3  . LORazepam (ATIVAN) 0.5 MG tablet Take 0.5 mg by mouth daily as needed for anxiety.     [name] losartan (COZAAR) 25 MG tablet Take 1 tablet (25 mg total) by mouth daily. 90 tablet 3  . metFORMIN (GLUCOPHAGE) 1000 MG tablet TAKE 1 TABLET BY MOUTH TWICE DAILY WITH A MEAL. 180 tablet 0  .  omeprazole (PRILOSEC) 20 MG capsule Take 20 mg by mouth daily.    Glory Rosebush VERIO test strip TEST BLOOD SUGARS TWICE DAILY. 100 each 3  . traZODone (DESYREL) 150 MG tablet Take 1 tablet (150 mg total) by mouth at bedtime. 90 tablet 3   No current facility-administered medications on file prior to visit.    Review of Systems:  As per HPI- otherwise negative.   Physical Examination: Filed Vitals:   02/10/16 1014  BP: 132/74  Pulse: 107  Temp: 97.7 F (36.5 C)   Filed Vitals:   02/10/16 1014  Height: 5\' 5"  (1.651 m)  Weight: 137 lb 3.2 oz (62.234 kg)   Body mass index is 22.83 kg/(m^2). Ideal Body Weight: Weight in (lb) to have BMI = 25: 149.9  GEN: WDWN, NAD, Non-toxic, A & O x 3, looks well HEENT: Atraumatic, Normocephalic. Neck supple. No masses, No LAD. Ears and Nose: No external deformity. CV: RRR, No M/G/R. No JVD. No thrill. No extra heart sounds. PULM: CTA B, no wheezes, crackles, rhonchi. No retractions. No resp. distress. No accessory muscle use. ABD: S, NT, ND, +BS. No rebound. No HSM. EXTR: No c/c/e NEURO Normal gait.  PSYCH: Normally interactive. Conversant. Not depressed or anxious appearing.  Calm demeanor.    Assessment and Plan: Benign essential HTN - Plan: losartan (COZAAR) 25 MG tablet  Depression - Plan: FLUoxetine (PROZAC) 20 MG capsule, traZODone (DESYREL) 150 MG tablet  Diabetes mellitus type 2 in nonobese (HCC) - Plan: Liraglutide (VICTOZA) 18 MG/3ML SOPN  Dyslipidemia - Plan: atorvastatin (LIPITOR) 20 MG tablet  Immunization due - Plan: Pneumococcal polysaccharide vaccine 23-valent greater than or equal to 2yo subcutaneous/IM  Updated pneumonia shot today Refilled her medications- she is well controlled with cozaar Her DM has looked good- she is seeing Dr. Dwyane Dee soon.  She wonders if I can take over her DM- I am glad too as it looks like Dr. Dwyane Dee has on a regimen that is working well for her Refilled her lipitor Refilled prozac and  trazodone.  She is not using wellbutrin and feels like her mood is overall good  Signed Lamar Blinks, MD  Pt called me approx 5:30 with complaint of pain the arm where she got her pneumovax.  The pain did not start until several hours after her shot.  She will use ibuprofen and ice, and will let me know if still painful tomorrow  Pt called again 4/6- her arm is still very sore.  She is using ibuprofen, hot and cold but it still hurts a lot.  Will rx tramadol for her to use- avoid using with her PRN ativan and take at least a couple of hours prior to her usual trazodone at night

## 2016-02-11 MED ORDER — TRAMADOL HCL 50 MG PO TABS: 50.0000 mg | ORAL_TABLET | Freq: Three times a day (TID) | ORAL | Status: DC | PRN

## 2016-02-11 NOTE — Addendum Note (Signed)
Addended by: Lamar Blinks C on: 02/11/2016 11:58 AM   Modules accepted: Orders

## 2016-03-02 ENCOUNTER — Other Ambulatory Visit: Payer: Medicare Other

## 2016-03-06 ENCOUNTER — Other Ambulatory Visit: Payer: Self-pay | Admitting: Family Medicine

## 2016-03-07 ENCOUNTER — Ambulatory Visit: Payer: Medicare Other | Admitting: Endocrinology

## 2016-03-08 DIAGNOSIS — E113291 Type 2 diabetes mellitus with mild nonproliferative diabetic retinopathy without macular edema, right eye: Secondary | ICD-10-CM | POA: Diagnosis not present

## 2016-03-14 ENCOUNTER — Other Ambulatory Visit (INDEPENDENT_AMBULATORY_CARE_PROVIDER_SITE_OTHER): Payer: Medicare Other

## 2016-03-14 DIAGNOSIS — E119 Type 2 diabetes mellitus without complications: Secondary | ICD-10-CM

## 2016-03-14 LAB — COMPREHENSIVE METABOLIC PANEL
ALBUMIN: 4.2 g/dL (ref 3.5–5.2)
ALT: 8 U/L (ref 0–35)
AST: 11 U/L (ref 0–37)
Alkaline Phosphatase: 54 U/L (ref 39–117)
BILIRUBIN TOTAL: 0.3 mg/dL (ref 0.2–1.2)
BUN: 20 mg/dL (ref 6–23)
CALCIUM: 9.3 mg/dL (ref 8.4–10.5)
CO2: 30 mEq/L (ref 19–32)
CREATININE: 1.07 mg/dL (ref 0.40–1.20)
Chloride: 98 mEq/L (ref 96–112)
GFR: 54.44 mL/min — ABNORMAL LOW (ref >60.00)
Glucose, Bld: 113 mg/dL — ABNORMAL HIGH (ref 70–99)
Potassium: 4.5 mEq/L (ref 3.5–5.1)
Sodium: 137 mEq/L (ref 135–145)
Total Protein: 6.7 g/dL (ref 6.0–8.3)

## 2016-03-14 LAB — HEMOGLOBIN A1C: HEMOGLOBIN A1C: 6.2 % (ref 4.6–6.5)

## 2016-03-17 ENCOUNTER — Ambulatory Visit (INDEPENDENT_AMBULATORY_CARE_PROVIDER_SITE_OTHER): Payer: Medicare Other | Admitting: Endocrinology

## 2016-03-17 ENCOUNTER — Encounter: Payer: Self-pay | Admitting: Endocrinology

## 2016-03-17 VITALS — BP 136/70 | HR 115 | Temp 98.1°F | Resp 14 | Ht 65.0 in | Wt 133.8 lb

## 2016-03-17 DIAGNOSIS — E119 Type 2 diabetes mellitus without complications: Secondary | ICD-10-CM | POA: Diagnosis not present

## 2016-03-17 DIAGNOSIS — R634 Abnormal weight loss: Secondary | ICD-10-CM

## 2016-03-17 DIAGNOSIS — R000 Tachycardia, unspecified: Secondary | ICD-10-CM | POA: Diagnosis not present

## 2016-03-17 LAB — T4, FREE: Free T4: 0.91 ng/dL (ref 0.60–1.60)

## 2016-03-17 LAB — TSH: TSH: 1.86 u[IU]/mL (ref 0.35–4.50)

## 2016-03-17 NOTE — Progress Notes (Signed)
Patient ID: Abigail [name], female   DOB: May 15, 1951, 65 y.o.   MRN: [id number]   Reason for Appointment : Followup for Type 2 Diabetes  History of Present Illness          Diagnosis: Type 2 diabetes mellitus, date of diagnosis: 2004 ?        Past history: She initially had nonspecific symptoms of shakiness at diagnosis and had only mild hyperglycemia. Subsequently was started on metformin low doses for treatment The dose of metformin was gradually increased and she thinks she has been taking 1000 mg twice a day for at least a year. She did have nausea with increasing the dose; however this is better now although not resolved. She probably tried Byetta several years ago but she believes it did not help her control. She has been managed with metformin alone for the last year and a half but blood sugars have been progressively higher.  Blood sugars prior to her initial consultation were mostly over 200, up to 315 and her A1c was 10.3%  Recent history:     Her blood sugars had improved with adding Victoza  Since 9/14.  She is using 1.2 mg without side effects On her visit in 9/16 her glipizide was stopped because of low normal readings fasting  Her A1c is fairly stable in the upper normal range, now 6.2, previously 5.8  Current blood sugar patterns and management, problems as follows:  She has checked her sugar once a day at various times  Blood sugars are slightly higher fasting but not consistently  Blood sugars may go up occasionally after breakfast or evening meal, highest 172  Her weight has gone down slightly recently but similar to what she had on her visit in 1/70  She has been eating small amounts usually  She has only recently started exercising  Oral hypoglycemic drugs: Metformin 1 g twice a day Side effects from medications: She did have nausea with Amaryl  Glucose monitoring:  one or more times a day      Glucometer:  One Touch ultra Blood Glucose  readings from download recently:   Mean values apply above for all meters except median for One Touch  PRE-MEAL Fasting Lunch Dinner Bedtime Overall  Glucose range: 102-143    97-171    Mean/median: 108    129   POST-MEAL PC Breakfast PC Lunch PC Dinner  Glucose range: 130-172     Mean/median:       Hypoglycemia: None      Self-care: The diet that the patient has been following [id number] to limit fat intake, portions and sweets. Will have fruit for snacks.  Eating out 2-3 times a week, rarely fast food;     Exercise: Walking Fairly regularly    Dietician visit: Most recent: Years ago. Has seen nurse educator.           Wt Readings from Last 3 Encounters:  03/17/16 133 lb 12.8 oz (60.691 kg)  02/10/16 137 lb 3.2 oz (62.234 kg)  12/07/15 134 lb 12.8 oz (61.145 kg)  . LABS:  Lab Results  Component Value Date   HGBA1C 6.2 03/14/2016   HGBA1C 5.8 12/03/2015   HGBA1C 5.8 07/09/2015   Lab Results  Component Value Date   MICROALBUR 2.6* 07/09/2015   LDLCALC 61 07/09/2015   CREATININE 1.07 03/14/2016       Medication List       This list is accurate as of: 03/17/16  5:05 PM.  Always use your most recent med list.               aspirin EC 325 MG tablet  Take 1 tablet (325 mg total) by mouth 2 (two) times daily.     atorvastatin 20 MG tablet  Commonly known as:  LIPITOR  Take 1 tablet (20 mg total) by mouth daily.     FLUoxetine 20 MG capsule  Commonly known as:  PROZAC  Take 1 capsule (20 mg total) by mouth daily.     Liraglutide 18 MG/3ML Sopn  Commonly known as:  VICTOZA  INJECT 1.2 MG INTO THE SKIN. Convert to 3 month supply with 4RF if allowed by insurance     LORazepam 0.5 MG tablet  Commonly known as:  ATIVAN  Take 0.5 mg by mouth daily as needed for anxiety.     losartan 25 MG tablet  Commonly known as:  COZAAR  Take 1 tablet (25 mg total) by mouth daily.     metFORMIN 1000 MG tablet  Commonly known as:  GLUCOPHAGE  TAKE 1 TABLET BY MOUTH TWICE  DAILY WITH A MEAL.     metFORMIN 1000 MG tablet  Commonly known as:  GLUCOPHAGE  TAKE 1 TABLET BY MOUTH TWICE DAILY WITH A MEAL     omeprazole 20 MG capsule  Commonly known as:  PRILOSEC  Take 1 capsule (20 mg total) by mouth daily.     ONETOUCH VERIO test strip  Generic drug:  glucose blood  TEST BLOOD SUGARS TWICE DAILY.     traMADol 50 MG tablet  Commonly known as:  ULTRAM  Take 1 tablet (50 mg total) by mouth every 8 (eight) hours as needed.     traZODone 150 MG tablet  Commonly known as:  DESYREL  Take 1 tablet (150 mg total) by mouth at bedtime.        Allergies:  Allergies  Allergen Reactions  . Pneumovax 23 [Pneumococcal Vac Polyvalent]     Pt had severe arm pain  . Amaryl [Glimepiride] Nausea Only  . Cephalexin Hives  . Sulfa Antibiotics Other (See Comments)    Unknown by pt    Past Medical History  Diagnosis Date  . Hypertension   . Hyperlipidemia   . Anxiety   . GERD (gastroesophageal reflux disease)   . Diabetes mellitus     Type 2  . Hematuria   . Depression     Past Surgical History  Procedure Laterality Date  . Ovarian cyst removal    . Hammer toe surgery    . Colonoscopy    . Orif ankle fracture Left 11/17/2014    Procedure: OPEN REDUCTION INTERNAL FIXATION (ORIF) LEFT TRIMALLEOLAR ANKLE FRACTURE;  Surgeon: Marianna Payment, MD;  Location: [facility];  Service: Orthopedics;  Laterality: Left;  . Fracture surgery    . Cholecystectomy N/A 06/03/2015    Procedure: LAPAROSCOPIC CHOLECYSTECTOMY WITH INTRAOPERATIVE CHOLANGIOGRAM;  Surgeon: Donnie Mesa, MD;  Location: [facility];  Service: General;  Laterality: N/A;    Family History  Problem Relation Age of Onset  . Diabetes Mother   . Hyperlipidemia Mother   . Diabetes Father   . Heart disease Father 109    AMI x 2; first AMI age 76  . Stroke Father   . Heart disease Brother     defibrillator; CHF  . Hyperlipidemia Brother     Social History:  reports that she has never smoked. She has never  used smokeless tobacco. She reports that  she drinks about 0.6 oz of alcohol per week. She reports that she does not use illicit drugs.    Review of Systems   Although she has about the same weight today as before she has been tending to lose weight for no apparent reason She thinks she feels fairly good without any unusual fatigue and is able to stay active also No shortness of breath on exertion No swelling of the feet Her pulse tends to be fast on the office visits although she thinks this may be from anxiety.  No complaints of palpitations       Lipids:  on treatment with Lipitor for years, LDL is  excellent    Lab Results  Component Value Date   CHOL 131 07/09/2015   HDL 48.00 07/09/2015   LDLCALC 61 07/09/2015   TRIG 107.0 07/09/2015   CHOLHDL 3 07/09/2015       The blood pressure has been treated with 25 mg Cozaar for "kidney protection".   Last diabetic foot exam was in 10/2014   LABS:  Office Visit on 03/17/2016  Component Date Value Ref Range Status  . TSH 03/17/2016 1.86  0.35 - 4.50 uIU/mL Final  . Free T4 03/17/2016 0.91  0.60 - 1.60 ng/dL Final  Lab on 03/14/2016  Component Date Value Ref Range Status  . Hgb A1c MFr Bld 03/14/2016 6.2  4.6 - 6.5 % Final   Glycemic Control Guidelines for People with Diabetes:Non Diabetic:  <6%Goal of Therapy: <7%Additional Action Suggested:  >8%   . Sodium 03/14/2016 137  135 - 145 mEq/L Final  . Potassium 03/14/2016 4.5  3.5 - 5.1 mEq/L Final  . Chloride 03/14/2016 98  96 - 112 mEq/L Final  . CO2 03/14/2016 30  19 - 32 mEq/L Final  . Glucose, Bld 03/14/2016 113* 70 - 99 mg/dL Final  . BUN 03/14/2016 20  6 - 23 mg/dL Final  . Creatinine, Ser 03/14/2016 1.07  0.40 - 1.20 mg/dL Final  . Total Bilirubin 03/14/2016 0.3  0.2 - 1.2 mg/dL Final  . Alkaline Phosphatase 03/14/2016 54  39 - 117 U/L Final  . AST 03/14/2016 11  0 - 37 U/L Final  . ALT 03/14/2016 8  0 - 35 U/L Final  . Total Protein 03/14/2016 6.7  6.0 - 8.3 g/dL Final    . Albumin 03/14/2016 4.2  3.5 - 5.2 g/dL Final  . Calcium 03/14/2016 9.3  8.4 - 10.5 mg/dL Final  . GFR 03/14/2016 54.44* >60.00 mL/min Final    Physical Examination:  BP 136/70 mmHg  Pulse 115  Temp(Src) 98.1 F (36.7 C)  Resp 14  Ht 5\' 5"  (1.651 m)  Wt 133 lb 12.8 oz (60.691 kg)  BMI 22.27 kg/m2  SpO2 97%    Repeat pulse was 100 She does not appear anxious  Thyroid not palpable No ankle edema  Tremor of outstretched hands present, somewhat coarse Skin appears normal. No diaphoresis Biceps reflexes are normal  ASSESSMENT/PLAN:  Diabetes type 2, uncontrolled   Her diabetes is continuing to be well controlled with her regimen of metformin, Victoza She may have minimal increase in postprandial readings at times  Encouraged her to have a protein at meals  Continue regular exercise  Weight loss and sinus tachycardia: Will check thyroid levels If these are normal she will need to follow-up with PCP  There are no Patient Instructions on file for this visit.   Saintclair Schroader 03/17/2016, 5:05 PM    Addendum: Thyroid levels are normal

## 2016-04-05 ENCOUNTER — Telehealth: Payer: Self-pay | Admitting: Endocrinology

## 2016-04-05 NOTE — Telephone Encounter (Addendum)
Patient would like for you to call her concerning her medication Victoza she need a 90 day supply, her ins is charging her $300 she can't afford it. Fax [phone]

## 2016-04-06 DIAGNOSIS — L570 Actinic keratosis: Secondary | ICD-10-CM | POA: Diagnosis not present

## 2016-04-06 DIAGNOSIS — L814 Other melanin hyperpigmentation: Secondary | ICD-10-CM | POA: Diagnosis not present

## 2016-04-06 NOTE — Telephone Encounter (Signed)
Message left for patient to return call.

## 2016-04-07 ENCOUNTER — Other Ambulatory Visit: Payer: Self-pay | Admitting: *Deleted

## 2016-04-07 DIAGNOSIS — E119 Type 2 diabetes mellitus without complications: Secondary | ICD-10-CM

## 2016-04-07 MED ORDER — LIRAGLUTIDE 18 MG/3ML ~~LOC~~ SOPN: PEN_INJECTOR | SUBCUTANEOUS | Status: DC

## 2016-04-07 NOTE — Telephone Encounter (Signed)
Pt calling back

## 2016-04-07 NOTE — Telephone Encounter (Signed)
She requested that a 90 day supply go to her mail order pharmacy.  Rx sent for a 90 day supply.

## 2016-05-09 ENCOUNTER — Other Ambulatory Visit: Payer: Self-pay | Admitting: Family Medicine

## 2016-06-03 ENCOUNTER — Other Ambulatory Visit: Payer: Self-pay | Admitting: Family Medicine

## 2016-06-06 ENCOUNTER — Telehealth: Payer: Self-pay | Admitting: Endocrinology

## 2016-06-06 ENCOUNTER — Telehealth: Payer: Self-pay

## 2016-06-06 MED ORDER — GLUCOSE BLOOD VI STRP: ORAL_STRIP | 2 refills | Status: DC

## 2016-06-06 NOTE — Telephone Encounter (Signed)
PT needs test strips sent to [facility]

## 2016-06-06 NOTE — Telephone Encounter (Signed)
Rx for test strips sent. Pt advised she could come by the office and pick up samples of the test strips pending her 90 day supply from [facility]. Pt voiced understanding.

## 2016-06-06 NOTE — Telephone Encounter (Signed)
Refill of medication, she stated that Suanne Marker always give her a couple of boxes (samples), because she can't afford it.

## 2016-06-07 NOTE — Telephone Encounter (Signed)
Patient need refill glucose blood (ONETOUCH VERIO) test strip. [facility] - [place], [place] - [address] AT [address] [phone] (Phone) [phone] (Fax)

## 2016-06-07 NOTE — Telephone Encounter (Deleted)
Patient needs the prescription sent to [facility], located at

## 2016-06-08 NOTE — Telephone Encounter (Signed)
Error

## 2016-06-23 ENCOUNTER — Other Ambulatory Visit (INDEPENDENT_AMBULATORY_CARE_PROVIDER_SITE_OTHER): Payer: Medicare Other

## 2016-06-23 DIAGNOSIS — E119 Type 2 diabetes mellitus without complications: Secondary | ICD-10-CM

## 2016-06-23 LAB — COMPREHENSIVE METABOLIC PANEL
ALT: 10 U/L (ref 0–35)
AST: 18 U/L (ref 0–37)
Albumin: 4.3 g/dL (ref 3.5–5.2)
Alkaline Phosphatase: 55 U/L (ref 39–117)
BUN: 21 mg/dL (ref 6–23)
CALCIUM: 9.4 mg/dL (ref 8.4–10.5)
CHLORIDE: 99 meq/L (ref 96–112)
CO2: 27 meq/L (ref 19–32)
CREATININE: 1.22 mg/dL — AB (ref 0.40–1.20)
GFR: 46.75 mL/min — ABNORMAL LOW (ref >60.00)
GLUCOSE: 98 mg/dL (ref 70–99)
Potassium: 4.4 mEq/L (ref 3.5–5.1)
Sodium: 135 mEq/L (ref 135–145)
Total Bilirubin: 0.4 mg/dL (ref 0.2–1.2)
Total Protein: 6.7 g/dL (ref 6.0–8.3)

## 2016-06-23 LAB — HEMOGLOBIN A1C: HEMOGLOBIN A1C: 6.1 % (ref 4.6–6.5)

## 2016-06-28 ENCOUNTER — Ambulatory Visit (INDEPENDENT_AMBULATORY_CARE_PROVIDER_SITE_OTHER): Payer: Medicare Other | Admitting: Endocrinology

## 2016-06-28 ENCOUNTER — Encounter: Payer: Self-pay | Admitting: Endocrinology

## 2016-06-28 VITALS — BP 128/66 | HR 93 | Ht 65.0 in | Wt 132.0 lb

## 2016-06-28 DIAGNOSIS — E119 Type 2 diabetes mellitus without complications: Secondary | ICD-10-CM

## 2016-06-28 NOTE — Progress Notes (Signed)
Patient ID: Abigail [name], female   DOB: [birth year], 65 y.o.   MRN: [id number]   Reason for Appointment : Followup for Type 2 Diabetes  History of Present Illness          Diagnosis: Type 2 diabetes mellitus, date of diagnosis: 2004 ?        Past history: She initially had nonspecific symptoms of shakiness at diagnosis and had only mild hyperglycemia. Subsequently was started on metformin low doses for treatment The dose of metformin was gradually increased and she thinks she has been taking 1000 mg twice a day for at least a year. She did have nausea with increasing the dose; however this is better now although not resolved. She probably tried Byetta several years ago but she believes it did not help her control. She has been managed with metformin alone for the last year and a half but blood sugars have been progressively higher.  Blood sugars prior to her initial consultation were mostly over 200, up to 315 and her A1c was 10.3% Her blood sugars had improved with adding Victoza Since 9/14 Subsequently glipizide was stopped  Recent history:     Non-insulin hypoglycemic drugs: Metformin 1 g twice a day, Victoza 1.2 mg daily  Her A1c is fairly stable in the upper normal range, now 6.1, previously 6.2, previously 5.8  Current blood sugar patterns and management, problems as follows:  She has checked her sugar once a day at various times but mostly in the mornings  Blood sugars are overall generally fairly good at breakfast  She tends to have some high readings after breakfast sometimes probably related to eating cereal or unbalanced breakfast meal  She thinks that occasionally blood sugar may be higher if she has something sweet at bedtime  She has a few readings after supper which are fairly good overall except an unusual reading of 297 at bedtime  Her weight is stabilized  She is not doing any formal exercise but she thinks she is very active at work now up to 6  days a week  Side effects from medications: She did have nausea with Amaryl  Glucose monitoring:  one or more times a day      Glucometer:  One Touch verio Blood Glucose readings from download recently:   Mean values apply above for all meters except median for One Touch  PRE-MEAL Fasting PCbf  Dinner Bedtime Overall  Glucose range: 86-131  104-207   91-297    Mean/median:     125   Hypoglycemia: None      Self-care: The diet that the patient has been following [id number] to limit fat intake, portions and sweets. Will have fruit for snacks.  Breakfast may be oatmeal or sometimes cereal, occasionally eggs/bacon and toast Eating out 2-3 times a week, rarely fast food      Exercise: Walking at work Dietician visit: Most recent: Years ago. Has seen nurse educator.           Wt Readings from Last 3 Encounters:  06/28/16 132 lb (59.9 kg)  03/17/16 133 lb 12.8 oz (60.7 kg)  02/10/16 137 lb 3.2 oz (62.2 kg)  . LABS:  Lab Results  Component Value Date   HGBA1C 6.1 06/23/2016   HGBA1C 6.2 03/14/2016   HGBA1C 5.8 12/03/2015   Lab Results  Component Value Date   MICROALBUR 2.6 (H) 07/09/2015   LDLCALC 61 07/09/2015   CREATININE 1.22 (H) 06/23/2016  Medication List       Accurate as of 06/28/16  3:33 PM. Always use your most recent med list.          aspirin EC 325 MG tablet Take 1 tablet (325 mg total) by mouth 2 (two) times daily.   atorvastatin 20 MG tablet Commonly known as:  LIPITOR Take 1 tablet (20 mg total) by mouth daily.   atorvastatin 20 MG tablet Commonly known as:  LIPITOR TAKE 1 TABLET(20 MG) BY MOUTH DAILY   FLUoxetine 20 MG capsule Commonly known as:  PROZAC Take 1 capsule (20 mg total) by mouth daily.   FLUoxetine 20 MG capsule Commonly known as:  PROZAC TAKE 1 CAPSULE(20 MG) BY MOUTH DAILY   glucose blood test strip Commonly known as:  ONETOUCH VERIO TEST BLOOD SUGARS TWICE DAILY.   Liraglutide 18 MG/3ML Sopn Commonly known as:   VICTOZA INJECT 1.2 MG INTO THE SKIN DAILY   LORazepam 0.5 MG tablet Commonly known as:  ATIVAN Take 0.5 mg by mouth daily as needed for anxiety.   losartan 25 MG tablet Commonly known as:  COZAAR Take 1 tablet (25 mg total) by mouth daily.   metFORMIN 1000 MG tablet Commonly known as:  GLUCOPHAGE TAKE 1 TABLET BY MOUTH TWICE DAILY WITH A MEAL.   metFORMIN 1000 MG tablet Commonly known as:  GLUCOPHAGE TAKE 1 TABLET BY MOUTH TWICE DAILY WITH A MEAL   omeprazole 20 MG capsule Commonly known as:  PRILOSEC Take 1 capsule (20 mg total) by mouth daily.   traMADol 50 MG tablet Commonly known as:  ULTRAM Take 1 tablet (50 mg total) by mouth every 8 (eight) hours as needed.   traZODone 150 MG tablet Commonly known as:  DESYREL Take 1 tablet (150 mg total) by mouth at bedtime.       Allergies:  Allergies  Allergen Reactions  . Pneumovax 23 [Pneumococcal Vac Polyvalent]     Pt had severe arm pain  . Amaryl [Glimepiride] Nausea Only  . Cephalexin Hives  . Sulfa Antibiotics Other (See Comments)    Unknown by pt    Past Medical History:  Diagnosis Date  . Anxiety   . Depression   . Diabetes mellitus    Type 2  . GERD (gastroesophageal reflux disease)   . Hematuria   . Hyperlipidemia   . Hypertension     Past Surgical History:  Procedure Laterality Date  . CHOLECYSTECTOMY N/A 06/03/2015   Procedure: LAPAROSCOPIC CHOLECYSTECTOMY WITH INTRAOPERATIVE CHOLANGIOGRAM;  Surgeon: Donnie Mesa, MD;  Location: [facility];  Service: General;  Laterality: N/A;  . COLONOSCOPY    . FRACTURE SURGERY    . HAMMER TOE SURGERY    . ORIF ANKLE FRACTURE Left 11/17/2014   Procedure: OPEN REDUCTION INTERNAL FIXATION (ORIF) LEFT TRIMALLEOLAR ANKLE FRACTURE;  Surgeon: Marianna Payment, MD;  Location: [facility];  Service: Orthopedics;  Laterality: Left;  . OVARIAN CYST REMOVAL      Family History  Problem Relation Age of Onset  . Diabetes Mother   . Hyperlipidemia Mother   . Diabetes Father     . Heart disease Father 65    AMI x 2; first AMI age 13  . Stroke Father   . Heart disease Brother     defibrillator; CHF  . Hyperlipidemia Brother     Social History:  reports that she has never smoked. She has never used smokeless tobacco. She reports that she drinks about 0.6 oz of alcohol per week . She  reports that she does not use drugs.    Review of Systems   Her pulse tends to be fast on the office visits Thyroid levels have been normal       Lipids:  on treatment with Lipitor for years, LDL is  excellent    Lab Results  Component Value Date   CHOL 131 07/09/2015   HDL 48.00 07/09/2015   LDLCALC 61 07/09/2015   TRIG 107.0 07/09/2015   CHOLHDL 3 07/09/2015       The blood pressure has been treated with 25 mg Cozaar for "kidney protection".   Last diabetic foot exam was in 1/17  Eye Dr is Dr Nicki Reaper, Last exam 3/17.    LABS:  Lab on 06/23/2016  Component Date Value Ref Range Status  . Hgb A1c MFr Bld 06/23/2016 6.1  4.6 - 6.5 % Final  . Sodium 06/23/2016 135  135 - 145 mEq/L Final  . Potassium 06/23/2016 4.4  3.5 - 5.1 mEq/L Final  . Chloride 06/23/2016 99  96 - 112 mEq/L Final  . CO2 06/23/2016 27  19 - 32 mEq/L Final  . Glucose, Bld 06/23/2016 98  70 - 99 mg/dL Final  . BUN 06/23/2016 21  6 - 23 mg/dL Final  . Creatinine, Ser 06/23/2016 1.22* 0.40 - 1.20 mg/dL Final  . Total Bilirubin 06/23/2016 0.4  0.2 - 1.2 mg/dL Final  . Alkaline Phosphatase 06/23/2016 55  39 - 117 U/L Final  . AST 06/23/2016 18  0 - 37 U/L Final  . ALT 06/23/2016 10  0 - 35 U/L Final  . Total Protein 06/23/2016 6.7  6.0 - 8.3 g/dL Final  . Albumin 06/23/2016 4.3  3.5 - 5.2 g/dL Final  . Calcium 06/23/2016 9.4  8.4 - 10.5 mg/dL Final  . GFR 06/23/2016 46.75* >60.00 mL/min Final    Physical Examination:  BP 128/66   Pulse 93   Ht 5\' 5"  (1.651 m)   Wt 132 lb (59.9 kg)   SpO2 97%   BMI 21.97 kg/m      ASSESSMENT/PLAN:  Diabetes type 2, BMI 22   Her diabetes is continuing  to be well controlled with her regimen of metformin, Victoza 1.2 mg A1c excellent at 6.2 She may have minimal increase in postprandial readings at night but may have high readings after breakfast with eating more carbohydrate without protein Her weight has stabilized and generally lower than a few years ago She is now fairly active  She will continue the same regimen except make sure she has less carbohydrate N protein at breakfast  Patient Instructions  Check blood sugars on waking up    Also check blood sugars about 2 hours after a meal and do this after different meals by rotation  Recommended blood sugar levels on waking up is 90-130 and about 2 hours after meal is 130-160  Please bring your blood sugar monitor to each visit, thank you  Protein at [facility] 06/28/2016, 3:33 PM

## 2016-06-28 NOTE — Patient Instructions (Signed)
Check blood sugars on waking up    Also check blood sugars about 2 hours after a meal and do this after different meals by rotation  Recommended blood sugar levels on waking up is 90-130 and about 2 hours after meal is 130-160  Please bring your blood sugar monitor to each visit, thank you  Protein at [facility]

## 2016-07-05 ENCOUNTER — Other Ambulatory Visit: Payer: Self-pay

## 2016-07-05 MED ORDER — GLUCOSE BLOOD VI STRP: ORAL_STRIP | 12 refills | Status: DC

## 2016-07-06 ENCOUNTER — Telehealth: Payer: Self-pay | Admitting: Endocrinology

## 2016-07-06 MED ORDER — GLUCOSE BLOOD VI STRP: ORAL_STRIP | 2 refills | Status: DC

## 2016-07-06 NOTE — Telephone Encounter (Signed)
glucose blood test strip,   Send to  [facility] - [place], [place] - [address] AT [address] [phone] (Phone) [phone] (Fax)

## 2016-07-06 NOTE — Telephone Encounter (Signed)
Prescription submitted per patient's request.  

## 2016-07-17 ENCOUNTER — Other Ambulatory Visit: Payer: Self-pay | Admitting: Family Medicine

## 2016-07-17 DIAGNOSIS — K219 Gastro-esophageal reflux disease without esophagitis: Secondary | ICD-10-CM

## 2016-07-18 NOTE — Telephone Encounter (Signed)
Please schedule pt for follow up appt. Thank you----PC 

## 2016-07-19 NOTE — Telephone Encounter (Signed)
Patient is eating breakfast and will call back to schedule follow up appointment.

## 2016-07-26 ENCOUNTER — Telehealth: Payer: Self-pay | Admitting: Family Medicine

## 2016-07-26 ENCOUNTER — Other Ambulatory Visit: Payer: Self-pay | Admitting: Emergency Medicine

## 2016-07-26 DIAGNOSIS — F329 Major depressive disorder, single episode, unspecified: Secondary | ICD-10-CM

## 2016-07-26 DIAGNOSIS — F32A Depression, unspecified: Secondary | ICD-10-CM

## 2016-07-26 MED ORDER — TRAZODONE HCL 150 MG PO TABS: 150.0000 mg | ORAL_TABLET | Freq: Every day | ORAL | 3 refills | Status: DC

## 2016-07-26 NOTE — Telephone Encounter (Signed)
[id number] - [place], [place] - [address] AT [address]  Reason for call:  pharmacy requesting a refill traZODone (DESYREL) 150 MG tablet

## 2016-07-26 NOTE — Telephone Encounter (Signed)
pharmacy requesting a refill traZODone (DESYREL) 150 MG tablet. Last office visit and refill 02/10/2016. Will it be ok to refill? Please advise.

## 2016-08-03 ENCOUNTER — Ambulatory Visit (HOSPITAL_BASED_OUTPATIENT_CLINIC_OR_DEPARTMENT_OTHER)
Admission: RE | Admit: 2016-08-03 | Discharge: 2016-08-03 | Disposition: A | Discharge status: Home / Self Care | Payer: Medicare Other | Source: Ambulatory Visit | Attending: Family Medicine | Admitting: Family Medicine

## 2016-08-03 ENCOUNTER — Ambulatory Visit (INDEPENDENT_AMBULATORY_CARE_PROVIDER_SITE_OTHER): Payer: Medicare Other | Admitting: Family Medicine

## 2016-08-03 VITALS — BP 129/77 | HR 98 | Temp 97.6°F | Wt 131.8 lb

## 2016-08-03 DIAGNOSIS — R102 Pelvic and perineal pain: Secondary | ICD-10-CM

## 2016-08-03 DIAGNOSIS — Z23 Encounter for immunization: Secondary | ICD-10-CM

## 2016-08-03 NOTE — Patient Instructions (Signed)
Please start drinking water or other fluid- you need to drink about 32 ounces (which is 4 standard 8 oz glasses) and then hold it!  This should take about an hour. Once you feel like you would really like to urinate proceed to the imaging department on the ground floor of the med-center for your ultrasound.   You can then go home and I will be in touch with your ultrasound results Please ask Dr. Dwyane Dee to check a fasting cholesterol panel at your next visit

## 2016-08-03 NOTE — Progress Notes (Signed)
Pre visit review using our clinic review tool, if applicable. No additional management support is needed unless otherwise documented below in the visit note. 

## 2016-08-03 NOTE — Progress Notes (Signed)
[facility] [address], [place], [place] [address] [id number] [phone]  Date:  08/03/2016   Name:  Abigail [name]   DOB:  12/30/1950   MRN:  [id number]  PCP:  Lamar Blinks, MD    Chief Complaint: Follow-up (Pt here for follow up visit. Will need refills on meds. Would like flu vaccine today.)   History of Present Illness:  Abigail [name] is a 65 y.o. very pleasant female patient who presents with the following:  Here today for a follow-up visit.  Last seen in April for BP check.   She does have well controlled DM followed by Dr. Ronald Pippins cholesterol about one year ago  Wt Readings from Last 3 Encounters:  08/03/16 131 lb 12.8 oz (59.8 kg)  06/28/16 132 lb (59.9 kg)  03/17/16 133 lb 12.8 oz (60.7 kg)   She has started working at [employer] part time and is really enjoying this.  She likes getting out of the house and making some money.  Flu shot today She declines zostavax as it is too expensive.    Her bone density is UTD through her OBG - she sees Dr. Helane Rima. Called to request a copy of this report.  She has noted a lower abd pain for about one month. It can occur on both sides of her pelvis., seems to move from one side to the other.  She is post- menopausal, no bleeding.  She has not had a hyst or other pelvic operation.  She does have a history of ovarian cysts in the past which have been followed clinically No constipation noted, no bowel change noted No urinary sx noted- no blood, no dysuria Appetite is ok- she is eating well She has lost some weight but thinks this is due to increased activity at her job  Lab Results  Component Value Date   HGBA1C 6.1 06/23/2016     Patient Active Problem List   Diagnosis Date Noted  . Microscopic hematuria 09/16/2015  . Trimalleolar fracture of ankle, closed 11/17/2014  . Other and unspecified hyperlipidemia 07/24/2013  . Chest pain 10/23/2012  . Type II or  unspecified type diabetes mellitus without mention of complication, uncontrolled 10/08/2012  . Anxiety 10/08/2012    Past Medical History:  Diagnosis Date  . Anxiety   . Depression   . Diabetes mellitus    Type 2  . GERD (gastroesophageal reflux disease)   . Hematuria   . Hyperlipidemia   . Hypertension     Past Surgical History:  Procedure Laterality Date  . CHOLECYSTECTOMY N/A 06/03/2015   Procedure: LAPAROSCOPIC CHOLECYSTECTOMY WITH INTRAOPERATIVE CHOLANGIOGRAM;  Surgeon: Donnie Mesa, MD;  Location: [place];  Service: General;  Laterality: N/A;  . COLONOSCOPY    . FRACTURE SURGERY    . HAMMER TOE SURGERY    . ORIF ANKLE FRACTURE Left 11/17/2014   Procedure: OPEN REDUCTION INTERNAL FIXATION (ORIF) LEFT TRIMALLEOLAR ANKLE FRACTURE;  Surgeon: Marianna Payment, MD;  Location: [place];  Service: Orthopedics;  Laterality: Left;  . OVARIAN CYST REMOVAL      Social History  Substance Use Topics  . Smoking status: Never Smoker  . Smokeless tobacco: Never Used  . Alcohol use 0.6 oz/week    1 Glasses of wine per week    Family History  Problem Relation Age of Onset  . Diabetes Mother   . Hyperlipidemia Mother   . Diabetes Father   . Heart disease  Father 65    AMI x 2; first AMI age 46  . Stroke Father   . Heart disease Brother     defibrillator; CHF  . Hyperlipidemia Brother     Allergies  Allergen Reactions  . Pneumovax 23 [Pneumococcal Vac Polyvalent]     Pt had severe arm pain  . Amaryl [Glimepiride] Nausea Only  . Cephalexin Hives  . Sulfa Antibiotics Other (See Comments)    Unknown by pt    Medication list has been reviewed and updated.  Current Outpatient Prescriptions on File Prior to Visit  Medication Sig Dispense Refill  . aspirin EC 325 MG tablet Take 1 tablet (325 mg total) by mouth 2 (two) times daily. (Patient taking differently: Take 325 mg by mouth daily as needed for moderate pain. ) 84 tablet 0  . atorvastatin (LIPITOR) 20 MG tablet Take 1 tablet  (20 mg total) by mouth daily. 90 tablet 3  . FLUoxetine (PROZAC) 20 MG capsule Take 1 capsule (20 mg total) by mouth daily. 90 capsule 3  . glucose blood (ONETOUCH VERIO) test strip TEST BLOOD SUGARS TWICE DAILY. 200 each 2  . glucose blood test strip Use as instructed 100 each 12  . Liraglutide (VICTOZA) 18 MG/3ML SOPN INJECT 1.2 MG INTO THE SKIN DAILY 18 mL 1  . LORazepam (ATIVAN) 0.5 MG tablet Take 0.5 mg by mouth daily as needed for anxiety.     [name] losartan (COZAAR) 25 MG tablet Take 1 tablet (25 mg total) by mouth daily. 90 tablet 3  . metFORMIN (GLUCOPHAGE) 1000 MG tablet TAKE 1 TABLET BY MOUTH TWICE DAILY WITH A MEAL. 180 tablet 3  . omeprazole (PRILOSEC) 20 MG capsule TAKE 1 CAPSULE(20 MG) BY MOUTH DAILY 90 capsule 0  . traZODone (DESYREL) 150 MG tablet Take 1 tablet (150 mg total) by mouth at bedtime. 90 tablet 3   No current facility-administered medications on file prior to visit.     Review of Systems:  As per HPI- otherwise negative.   Physical Examination: Vitals:   08/03/16 0917  BP: 129/77  Pulse: 98  Temp: 97.6 F (36.4 C)   Vitals:   08/03/16 0917  Weight: 131 lb 12.8 oz (59.8 kg)   Body mass index is 21.93 kg/m. Ideal Body Weight:    GEN: WDWN, NAD, Non-toxic, A & O x 3, normal weight, looks well HEENT: Atraumatic, Normocephalic. Neck supple. No masses, No LAD.  Bilateral TM wnl, oropharynx normal.  PEERL,EOMI.   Ears and Nose: No external deformity. CV: RRR, No M/G/R. No JVD. No thrill. No extra heart sounds. PULM: CTA B, no wheezes, crackles, rhonchi. No retractions. No resp. distress. No accessory muscle use. ABD: S, NT, ND, +BS. No rebound. No HSM.  She notes mild discomfort with palpation of the RLQ EXTR: No c/c/e NEURO Normal gait.  PSYCH: Normally interactive. Conversant. Not depressed or anxious appearing.  Calm demeanor.   Drink 32 oz of water start 1 hour prior to exam.   US Transvaginal Non-ob  Result Date: 08/03/2016 CLINICAL DATA:  Lower  abdominal and pelvic pain for the past month without vaginal bleeding or discharge. History of previous ovarian cyst removed several years ago. The patient is postmenopausal. EXAM: TRANSABDOMINAL AND TRANSVAGINAL ULTRASOUND OF PELVIS TECHNIQUE: Both transabdominal and transvaginal ultrasound examinations of the pelvis were performed. Transabdominal technique was performed for global imaging of the pelvis including uterus, ovaries, adnexal regions, and pelvic cul-de-sac. It was necessary to proceed with endovaginal exam following the transabdominal exam  to visualize the uterus, ovaries, and adnexal structures. COMPARISON:  No recent ultrasounds in PACs. The 2016 study identified in epic is not available in PACs. FINDINGS: Uterus Measurements: 5.7 x 2.0 x 3.5 cm. No fibroids or other mass visualized. Endometrium Thickness: 2 mm.  No focal abnormality visualized. Right ovary Measurements: 1.5 x 0.7 x 0.9 cm. Normal appearance/no adnexal mass. Left ovary Measurements: 1.5 x 0.7 x 1.0 cm. Normal appearance/no adnexal mass. Other findings No abnormal free fluid. Increased bowel gas and stool in the pelvis limits visualization of the adnexal structures. IMPRESSION: 1. Normal appearance of the uterus and endometrium. 2. Normal appearance of the ovaries. No adnexal masses are demonstrated. Electronically Signed   By: David  Martinique M.D.   On: 08/03/2016 12:38   US Pelvis Complete  Result Date: 08/03/2016 CLINICAL DATA:  Lower abdominal and pelvic pain for the past month without vaginal bleeding or discharge. History of previous ovarian cyst removed several years ago. The patient is postmenopausal. EXAM: TRANSABDOMINAL AND TRANSVAGINAL ULTRASOUND OF PELVIS TECHNIQUE: Both transabdominal and transvaginal ultrasound examinations of the pelvis were performed. Transabdominal technique was performed for global imaging of the pelvis including uterus, ovaries, adnexal regions, and pelvic cul-de-sac. It was necessary to proceed  with endovaginal exam following the transabdominal exam to visualize the uterus, ovaries, and adnexal structures. COMPARISON:  No recent ultrasounds in PACs. The 2016 study identified in epic is not available in PACs. FINDINGS: Uterus Measurements: 5.7 x 2.0 x 3.5 cm. No fibroids or other mass visualized. Endometrium Thickness: 2 mm.  No focal abnormality visualized. Right ovary Measurements: 1.5 x 0.7 x 0.9 cm. Normal appearance/no adnexal mass. Left ovary Measurements: 1.5 x 0.7 x 1.0 cm. Normal appearance/no adnexal mass. Other findings No abnormal free fluid. Increased bowel gas and stool in the pelvis limits visualization of the adnexal structures. IMPRESSION: 1. Normal appearance of the uterus and endometrium. 2. Normal appearance of the ovaries. No adnexal masses are demonstrated. Electronically Signed   By: David  Martinique M.D.   On: 08/03/2016 12:38    Assessment and Plan: Pelvic pain in female - Plan: US Pelvis Complete, US Transvaginal Non-OB, Urine culture  Immunization due  Encounter for immunization - Plan: Flu vaccine HIGH DOSE PF  Here today with a couple of concerns Flu shot today She has noted pelvic discomfort for about one month.  Her [place] is reassuring- called and discussed the results.  She does have stool and gas in the pelvis which may be the source of her symptoms.  Await urine culture. She will try a stool softener at home for a few days to see if this might help I will be in touch with her pending her urine culture   Signed Lamar Blinks, MD

## 2016-08-05 ENCOUNTER — Encounter: Payer: Self-pay | Admitting: Family Medicine

## 2016-08-05 NOTE — Telephone Encounter (Signed)
Called to check on her- she reports feeling well, no pain today.  She will seek care if recurrence or worsening of her sx

## 2016-09-19 ENCOUNTER — Telehealth: Payer: Self-pay | Admitting: Endocrinology

## 2016-09-19 NOTE — Telephone Encounter (Signed)
[facility] need a prescription for patient diabetic medical supply's. [phone]

## 2016-09-28 NOTE — Telephone Encounter (Signed)
I attempted to call rx for testing supplies to this company. They state they will fax forms for [place] to complete and they will send patients supplies to her.

## 2016-10-01 ENCOUNTER — Encounter: Payer: Self-pay | Admitting: Family Medicine

## 2016-10-16 ENCOUNTER — Other Ambulatory Visit: Payer: Self-pay | Admitting: Family Medicine

## 2016-10-17 ENCOUNTER — Other Ambulatory Visit: Payer: Self-pay | Admitting: Family Medicine

## 2016-10-17 ENCOUNTER — Other Ambulatory Visit: Payer: Self-pay | Admitting: Emergency Medicine

## 2016-10-17 MED ORDER — METFORMIN HCL 1000 MG PO TABS: ORAL_TABLET | ORAL | 3 refills | Status: DC

## 2016-10-25 ENCOUNTER — Other Ambulatory Visit (INDEPENDENT_AMBULATORY_CARE_PROVIDER_SITE_OTHER): Payer: Medicare Other

## 2016-10-25 DIAGNOSIS — E119 Type 2 diabetes mellitus without complications: Secondary | ICD-10-CM | POA: Diagnosis not present

## 2016-10-25 LAB — LIPID PANEL
CHOL/HDL RATIO: 2
Cholesterol: 150 mg/dL (ref 0–200)
HDL: 66.4 mg/dL (ref >39.00)
LDL Cholesterol: 62 mg/dL (ref 0–99)
NONHDL: 83.92
Triglycerides: 112 mg/dL (ref 0.0–149.0)
VLDL: 22.4 mg/dL (ref 0.0–40.0)

## 2016-10-25 LAB — MICROALBUMIN / CREATININE URINE RATIO
Creatinine,U: 178.9 mg/dL
Microalb Creat Ratio: 3.7 mg/g (ref 0.0–30.0)
Microalb, Ur: 6.7 mg/dL — ABNORMAL HIGH (ref 0.0–1.9)

## 2016-10-25 LAB — BASIC METABOLIC PANEL
BUN: 16 mg/dL (ref 6–23)
CALCIUM: 9.4 mg/dL (ref 8.4–10.5)
CO2: 29 meq/L (ref 19–32)
Chloride: 100 mEq/L (ref 96–112)
Creatinine, Ser: 1.11 mg/dL (ref 0.40–1.20)
GFR: 52.09 mL/min — ABNORMAL LOW (ref >60.00)
GLUCOSE: 107 mg/dL — AB (ref 70–99)
Potassium: 4.6 mEq/L (ref 3.5–5.1)
Sodium: 138 mEq/L (ref 135–145)

## 2016-10-25 LAB — HEMOGLOBIN A1C: Hgb A1c MFr Bld: 6.2 % (ref 4.6–6.5)

## 2016-10-28 ENCOUNTER — Ambulatory Visit (INDEPENDENT_AMBULATORY_CARE_PROVIDER_SITE_OTHER): Payer: Federal, State, Local not specified - PPO | Admitting: Endocrinology

## 2016-10-28 ENCOUNTER — Encounter: Payer: Self-pay | Admitting: Endocrinology

## 2016-10-28 VITALS — BP 122/60 | HR 120 | Temp 98.6°F | Resp 16 | Ht 65.0 in | Wt 128.5 lb

## 2016-10-28 DIAGNOSIS — E119 Type 2 diabetes mellitus without complications: Secondary | ICD-10-CM | POA: Diagnosis not present

## 2016-10-28 NOTE — Progress Notes (Signed)
Patient ID: Abigail [name], female   DOB: 1950-12-24, 65 y.o.   MRN: [id number]   Reason for Appointment : Followup for Type 2 Diabetes  History of Present Illness          Diagnosis: Type 2 diabetes mellitus, date of diagnosis: 2004 ?        Past history: She initially had nonspecific symptoms of shakiness at diagnosis and had only mild hyperglycemia. Subsequently was started on metformin low doses for treatment The dose of metformin was gradually increased and she thinks she has been taking 1000 mg twice a day for at least a year. She did have nausea with increasing the dose; however this is better now although not resolved. She probably tried Byetta several years ago but she believes it did not help her control. She has been managed with metformin alone for the last year and a half but blood sugars have been progressively higher.  Blood sugars prior to her initial consultation were mostly over 200, up to 315 and her A1c was 10.3% Her blood sugars had improved with adding Victoza Since 9/14 Subsequently glipizide was stopped  Recent history:     Non-insulin hypoglycemic drugs: Metformin 1 g twice a day, Victoza 1.2 mg daily  Her A1c is fairly stable in the upper normal range, usually 6.1- 6.2  Current blood sugar patterns and management, problems as follows:  She has checked her sugar once a day at various times  Blood sugars are overall generally fairly good in the mornings and occasionally right before supper time  She tends to have some mildly high readings after various meals including lunch based on her diet, she thinks blood sugars are mostly high when she is getting more carbohydrate  She is trying to be active in addition to 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 Will have fruit for snacks.  Breakfast may be oatmeal or sometimes cereal or peanut butter toast, occasionally eggs/bacon and toast Eating out 2-3 times a week, rarely fast food      Exercise: Walking at work Dietician visit: Most recent: Years ago. Has seen nurse educator.           Wt Readings from Last 3 Encounters:  10/28/16 128 lb 8 oz (58.3 kg)  08/03/16 131 lb 12.8 oz (59.8 kg)  06/28/16 132 lb (59.9 kg)  . LABS:  Lab Results  Component Value Date   HGBA1C 6.2 10/25/2016   HGBA1C 6.1 06/23/2016   HGBA1C 6.2 03/14/2016   Lab Results  Component Value Date   MICROALBUR 6.7 (H) 10/25/2016   LDLCALC 62 10/25/2016   CREATININE 1.11 10/25/2016    Other problems see review of systems   Allergies as of 10/28/2016      Reactions   Pneumovax 23 [pneumococcal Vac Polyvalent]    Pt had severe arm pain   Amaryl [glimepiride] Nausea Only   Cephalexin Hives   Sulfa Antibiotics Other (See Comments)   Unknown by pt  Medication List       Accurate as of 10/28/16  9:00 AM. Always use your most recent med list.          aspirin EC 325 MG tablet Take 1 tablet (325 mg total) by mouth 2 (two) times daily.   atorvastatin 20 MG tablet Commonly known as:  LIPITOR Take 1 tablet (20 mg total) by mouth daily.   FLUoxetine 20 MG capsule Commonly known as:  PROZAC Take 1 capsule (20 mg total) by mouth daily.   glucose blood test strip Use as instructed   glucose blood test strip Commonly known as:  ONETOUCH VERIO TEST BLOOD SUGARS TWICE DAILY.   liraglutide 18 MG/3ML Sopn Commonly known as:  VICTOZA INJECT 1.2 MG INTO THE SKIN DAILY   losartan 25 MG tablet Commonly known as:  COZAAR Take 1 tablet (25 mg total) by mouth  daily.   metFORMIN 1000 MG tablet Commonly known as:  GLUCOPHAGE TAKE 1 TABLET BY MOUTH TWICE DAILY WITH A MEAL.   omeprazole 20 MG capsule Commonly known as:  PRILOSEC TAKE 1 CAPSULE(20 MG) BY MOUTH DAILY   traZODone 150 MG tablet Commonly known as:  DESYREL Take 1 tablet (150 mg total) by mouth at bedtime.       Allergies:  Allergies  Allergen Reactions  . Pneumovax 23 [Pneumococcal Vac Polyvalent]     Pt had severe arm pain  . Amaryl [Glimepiride] Nausea Only  . Cephalexin Hives  . Sulfa Antibiotics Other (See Comments)    Unknown by pt    Past Medical History:  Diagnosis Date  . Anxiety   . Depression   . Diabetes mellitus    Type 2  . GERD (gastroesophageal reflux disease)   . Hematuria   . Hyperlipidemia   . Hypertension     Past Surgical History:  Procedure Laterality Date  . CHOLECYSTECTOMY N/A 06/03/2015   Procedure: LAPAROSCOPIC CHOLECYSTECTOMY WITH INTRAOPERATIVE CHOLANGIOGRAM;  Surgeon: Donnie Mesa, MD;  Location: [place];  Service: General;  Laterality: N/A;  . COLONOSCOPY    . FRACTURE SURGERY    . HAMMER TOE SURGERY    . ORIF ANKLE FRACTURE Left 11/17/2014   Procedure: OPEN REDUCTION INTERNAL FIXATION (ORIF) LEFT TRIMALLEOLAR ANKLE FRACTURE;  Surgeon: Marianna Payment, MD;  Location: [place];  Service: Orthopedics;  Laterality: Left;  . OVARIAN CYST REMOVAL      Family History  Problem Relation Age of Onset  . Diabetes Mother   . Hyperlipidemia Mother   . Diabetes Father   . Heart disease Father 39    AMI x 2; first AMI age 12  . Stroke Father   . Heart disease Brother     defibrillator; CHF  . Hyperlipidemia Brother     Social History:  reports that she has never smoked. She has never used smokeless tobacco. She reports that she drinks about 0.6 oz of alcohol per week . She reports that she does not use drugs.    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   Last diabetic foot exam was in 1/17  Eye Dr is Dr Nicki Reaper, Last exam 3/17.    LABS:  Lab on 10/25/2016  Component Date Value Ref Range Status  . Hgb A1c MFr Bld 10/25/2016 6.2  4.6 - 6.5 % Final  . Sodium 10/25/2016 138  135 - 145 mEq/L Final  . Potassium 10/25/2016 4.6  3.5 - 5.1 mEq/L Final  . Chloride 10/25/2016 100  96 - 112 mEq/L Final  . CO2 10/25/2016 29  19 - 32 mEq/L Final  . Glucose, Bld 10/25/2016 107* 70 - 99 mg/dL Final  . BUN 10/25/2016 16  6 - 23 mg/dL Final  . Creatinine, Ser 10/25/2016 1.11  0.40 - 1.20 mg/dL Final  . Calcium 10/25/2016 9.4  8.4 - 10.5 mg/dL Final  . GFR 10/25/2016 52.09* >60.00 mL/min Final  . Microalb, Ur 10/25/2016 6.7* 0.0 - 1.9 mg/dL Final  . Creatinine,U 10/25/2016 178.9  mg/dL Final  . Microalb Creat Ratio 10/25/2016 3.7  0.0 - 30.0 mg/g Final  . Cholesterol 10/25/2016 150  0 - 200 mg/dL Final  . Triglycerides 10/25/2016 112.0  0.0 - 149.0 mg/dL Final  . HDL 10/25/2016 66.40  >39.00 mg/dL Final  . VLDL 10/25/2016 22.4  0.0 - 40.0 mg/dL Final  . LDL Cholesterol 10/25/2016 62  0 - 99 mg/dL Final  . Total CHOL/HDL Ratio 10/25/2016 2   Final  . NonHDL 10/25/2016 83.92   Final    Physical Examination:  BP 122/60   Pulse (!) 120   Temp 98.6 F (37 C) (Oral)   Resp 16   Ht 5\' 5"  (1.651 m)   Wt 128 lb 8 oz (58.3 kg)   BMI 21.38 kg/m     She has a relatively coarse tremor on the hands  especially on the right Repeat pulse 108 Thyroid not palpable  ASSESSMENT/PLAN:  Diabetes type 2, BMI 22   Her diabetes is continuing to be well controlled with her regimen of metformin, Victoza 1.2 mg A1c excellent at 6.2 She may have blood sugars in the 170s when she is eating more carbohydrate Her weight has stabilized and generally lower than a few years ago She is  fairly active  She will continue the same regimen  She will call if she continues to have any unusual weight loss  LIPIDS: Excellent control with  20 mg Lipitor  Essential tremor and tachycardia: May benefit from propranolol, will defer to PCP   There are no Patient Instructions on file for this visit.   Devlin Brink 10/28/2016, 9:00 AM

## 2016-11-01 DIAGNOSIS — J069 Acute upper respiratory infection, unspecified: Secondary | ICD-10-CM | POA: Diagnosis not present

## 2016-12-05 DIAGNOSIS — J22 Unspecified acute lower respiratory infection: Secondary | ICD-10-CM | POA: Diagnosis not present

## 2016-12-05 DIAGNOSIS — R0602 Shortness of breath: Secondary | ICD-10-CM | POA: Diagnosis not present

## 2016-12-07 ENCOUNTER — Other Ambulatory Visit: Payer: Self-pay

## 2016-12-07 ENCOUNTER — Telehealth: Payer: Self-pay | Admitting: Endocrinology

## 2016-12-07 DIAGNOSIS — E119 Type 2 diabetes mellitus without complications: Secondary | ICD-10-CM

## 2016-12-07 MED ORDER — LIRAGLUTIDE 18 MG/3ML ~~LOC~~ SOPN: PEN_INJECTOR | SUBCUTANEOUS | 1 refills | Status: DC

## 2016-12-07 NOTE — Telephone Encounter (Signed)
rx submitted.  

## 2016-12-07 NOTE — Telephone Encounter (Signed)
Pt needs her Victoza sent to the [facility].

## 2016-12-12 DIAGNOSIS — J22 Unspecified acute lower respiratory infection: Secondary | ICD-10-CM | POA: Diagnosis not present

## 2016-12-13 ENCOUNTER — Encounter: Payer: Self-pay | Admitting: Family Medicine

## 2016-12-18 ENCOUNTER — Other Ambulatory Visit: Payer: Self-pay | Admitting: Family Medicine

## 2016-12-18 DIAGNOSIS — I10 Essential (primary) hypertension: Secondary | ICD-10-CM

## 2016-12-27 ENCOUNTER — Other Ambulatory Visit: Payer: Self-pay | Admitting: Family Medicine

## 2017-01-02 ENCOUNTER — Other Ambulatory Visit: Payer: Self-pay | Admitting: Family Medicine

## 2017-01-02 ENCOUNTER — Encounter: Payer: Self-pay | Admitting: Family Medicine

## 2017-01-02 ENCOUNTER — Ambulatory Visit (INDEPENDENT_AMBULATORY_CARE_PROVIDER_SITE_OTHER): Payer: Medicare Other | Admitting: Family Medicine

## 2017-01-02 VITALS — BP 164/82 | HR 90 | Temp 98.0°F | Ht 65.0 in | Wt 134.2 lb

## 2017-01-02 DIAGNOSIS — I10 Essential (primary) hypertension: Secondary | ICD-10-CM | POA: Diagnosis not present

## 2017-01-02 DIAGNOSIS — D509 Iron deficiency anemia, unspecified: Secondary | ICD-10-CM | POA: Diagnosis not present

## 2017-01-02 DIAGNOSIS — R233 Spontaneous ecchymoses: Secondary | ICD-10-CM

## 2017-01-02 DIAGNOSIS — R238 Other skin changes: Secondary | ICD-10-CM

## 2017-01-02 DIAGNOSIS — G250 Essential tremor: Secondary | ICD-10-CM

## 2017-01-02 LAB — COMPREHENSIVE METABOLIC PANEL
ALT: 10 U/L (ref 0–35)
AST: 12 U/L (ref 0–37)
Albumin: 4 g/dL (ref 3.5–5.2)
Alkaline Phosphatase: 57 U/L (ref 39–117)
BUN: 19 mg/dL (ref 6–23)
CO2: 29 mEq/L (ref 19–32)
Calcium: 9.3 mg/dL (ref 8.4–10.5)
Chloride: 102 mEq/L (ref 96–112)
Creatinine, Ser: 1 mg/dL (ref 0.40–1.20)
GFR: 58.72 mL/min — ABNORMAL LOW (ref >60.00)
Glucose, Bld: 97 mg/dL (ref 70–99)
Potassium: 4.6 mEq/L (ref 3.5–5.1)
Sodium: 138 mEq/L (ref 135–145)
Total Bilirubin: 0.2 mg/dL (ref 0.2–1.2)
Total Protein: 6.6 g/dL (ref 6.0–8.3)

## 2017-01-02 LAB — CBC WITH DIFFERENTIAL/PLATELET
Basophils Absolute: 0 10*3/uL (ref 0.0–0.1)
Basophils Relative: 0.4 % (ref 0.0–3.0)
Eosinophils Absolute: 0.1 10*3/uL (ref 0.0–0.7)
Eosinophils Relative: 1.7 % (ref 0.0–5.0)
HCT: 26.5 % — ABNORMAL LOW (ref 36.0–46.0)
Hemoglobin: 8.3 g/dL — ABNORMAL LOW (ref 12.0–15.0)
Lymphocytes Relative: 28.8 % (ref 12.0–46.0)
Lymphs Abs: 1.6 10*3/uL (ref 0.7–4.0)
MCHC: 31.1 g/dL (ref 30.0–36.0)
MCV: 72.7 fl — ABNORMAL LOW (ref 78.0–100.0)
Monocytes Absolute: 0.5 10*3/uL (ref 0.1–1.0)
Monocytes Relative: 9.7 % (ref 3.0–12.0)
Neutro Abs: 3.3 10*3/uL (ref 1.4–7.7)
Neutrophils Relative %: 59.4 % (ref 43.0–77.0)
Platelets: 300 10*3/uL (ref 150.0–400.0)
RBC: 3.65 Mil/uL — ABNORMAL LOW (ref 3.87–5.11)
RDW: 14.7 % (ref 11.5–15.5)
WBC: 5.6 10*3/uL (ref 4.0–10.5)

## 2017-01-02 LAB — TSH: TSH: 1.45 u[IU]/mL (ref 0.35–4.50)

## 2017-01-02 MED ORDER — PROPRANOLOL HCL 10 MG PO TABS: ORAL_TABLET | ORAL | 6 refills | Status: DC

## 2017-01-02 NOTE — Patient Instructions (Addendum)
We will make sure that your labs look ok due to your easy bruising.   If you develop any other bleeding or bruising gets worse please let me know!  We are going to try a low dose of propranolol for your tremor; take it as needed prior to a shift at work

## 2017-01-02 NOTE — Progress Notes (Addendum)
Abigail [name] at [facility] [address], [place],  [address] [id number] [phone]  Date:  01/02/2017   Name:  Abigail [name]   DOB:  1951-03-02   MRN:  [id number]  PCP:  Lamar Blinks, MD    Chief Complaint: Follow-up (Pt here for diabetes f/u visit. Pt monitoring glucose at home and it is stable. Also c/o brusing easily fx 1 yr. ) and Tremors (c/o bilateral tremors in both hands. 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 Would like to discuss treatment options. )   History of Present Illness:  Abigail [name] is a 66 y.o. very pleasant female patient who presents with the following:  Here today to discuss her DM and concern about a tremor- her A1c has looked great recently.   She did see Dr. Dwyane Dee in December. Generally her glucose is under very good control  She has noted a tremor in her hands for several years; no other tremors, weakness, numbness, slurred speech or other neurological sx noted She thinks she has had the tremor for at least 3 years.  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  She really enjoys serving and does not want anything to get in the way of this; she sometimes feels embarrassed that he hands will shake Her mother has a tremor as well  She has also noted easy bruising for a couple of months-  Notes this on her hands and forearms.  No other areas of bruising, no blood in stool or urine, no nosebleeds She is on an aspirin but no other blood thinners   A family member died yesterday so she thinks this is why her BP is a bit high today.    Recent cholesterol looked good too  She has noted a skin lesion on her right shin for about 2 months that does not want to heal- she does have a derm doctor; she will schedule and see her for eval if needed   Notes that her BP is high today; she  suspect this is because she was in the hospital yesterday with a family member who died unexpectedly at a young age and was up most of the night feeling upset.  Her BP has otherwise been under good control recently with her losartan Lab Results  Component Value Date   HGBA1C 6.2 10/25/2016   BP Readings from Last 3 Encounters:  01/02/17 (!) 164/82  10/28/16 122/60  08/03/16 129/77     Patient Active Problem List   Diagnosis Date Noted  . Microscopic hematuria 09/16/2015  . Trimalleolar fracture of ankle, closed 11/17/2014  . Other and unspecified hyperlipidemia 07/24/2013  . Chest pain 10/23/2012  . Type II or unspecified type diabetes mellitus without mention of complication, uncontrolled 10/08/2012  . Anxiety 10/08/2012    Past Medical History:  Diagnosis Date  . Anxiety   . Depression   . Diabetes mellitus    Type 2  . GERD (gastroesophageal reflux disease)   . Hematuria   . Hyperlipidemia   . Hypertension     Past Surgical History:  Procedure Laterality Date  . CHOLECYSTECTOMY N/A 06/03/2015   Procedure: LAPAROSCOPIC CHOLECYSTECTOMY WITH INTRAOPERATIVE CHOLANGIOGRAM;  Surgeon: Donnie Mesa, MD;  Location: [place];  Service: General;  Laterality: N/A;  . COLONOSCOPY    .  FRACTURE SURGERY    . HAMMER TOE SURGERY    . ORIF ANKLE FRACTURE Left 11/17/2014   Procedure: OPEN REDUCTION INTERNAL FIXATION (ORIF) LEFT TRIMALLEOLAR ANKLE FRACTURE;  Surgeon: Marianna Payment, MD;  Location: [place];  Service: Orthopedics;  Laterality: Left;  . OVARIAN CYST REMOVAL      Social History  Substance Use Topics  . Smoking status: Never Smoker  . Smokeless tobacco: Never Used  . Alcohol use 0.6 oz/week    1 Glasses of wine per week    Family History  Problem Relation Age of Onset  . Diabetes Mother   . Hyperlipidemia Mother   . Diabetes Father   . Heart disease Father 63    AMI x 2; first AMI age 81  . Stroke Father   . Heart disease Brother     defibrillator; CHF  .  Hyperlipidemia Brother     Allergies  Allergen Reactions  . Pneumovax 23 [Pneumococcal Vac Polyvalent]     Pt had severe arm pain  . Amaryl [Glimepiride] Nausea Only  . Cephalexin Hives  . Sulfa Antibiotics Other (See Comments)    Unknown by pt    Medication list has been reviewed and updated.  Current Outpatient Prescriptions on File Prior to Visit  Medication Sig Dispense Refill  . aspirin EC 325 MG tablet Take 1 tablet (325 mg total) by mouth 2 (two) times daily. (Patient taking differently: Take 325 mg by mouth daily as needed for moderate pain. ) 84 tablet 0  . atorvastatin (LIPITOR) 20 MG tablet Take 1 tablet (20 mg total) by mouth daily. 90 tablet 3  . FLUoxetine (PROZAC) 20 MG capsule Take 1 capsule (20 mg total) by mouth daily. 90 capsule 3  . glucose blood (ONETOUCH VERIO) test strip TEST BLOOD SUGARS TWICE DAILY. 200 each 2  . liraglutide (VICTOZA) 18 MG/3ML SOPN INJECT 1.2 MG INTO THE SKIN DAILY 18 mL 1  . losartan (COZAAR) 25 MG tablet Take 1 tablet (25 mg total) by mouth daily. 90 tablet 3  . metFORMIN (GLUCOPHAGE) 1000 MG tablet TAKE 1 TABLET BY MOUTH TWICE DAILY WITH A MEAL. 180 tablet 3  . omeprazole (PRILOSEC) 20 MG capsule TAKE 1 CAPSULE(20 MG) BY MOUTH DAILY 90 capsule 0  . traZODone (DESYREL) 150 MG tablet Take 1 tablet (150 mg total) by mouth at bedtime. 90 tablet 3   No current facility-administered medications on file prior to visit.     Review of Systems:  As per HPI- otherwise negative.   Physical Examination: Vitals:   01/02/17 1033  BP: (!) 164/82  Pulse: 90  Temp: 98 F (36.7 C)   Vitals:   01/02/17 1033  Weight: 134 lb 3.2 oz (60.9 kg)  Height: 5\' 5"  (1.651 m)   Body mass index is 22.33 kg/m. Ideal Body Weight: Weight in (lb) to have BMI = 25: 149.9  GEN: WDWN, NAD, Non-toxic, A & O x 3, normal weight, looks well HEENT: Atraumatic, Normocephalic. Neck supple. No masses, No LAD. Ears and Nose: No external deformity. CV: RRR, No  M/G/R. No JVD. No thrill. No extra heart sounds. PULM: CTA B, no wheezes, crackles, rhonchi. No retractions. No resp. distress. No accessory muscle use. EXTR: No c/c/e NEURO Normal gait. Normal strength and DTR of all extremities, normal romberg testing  PSYCH: Normally interactive. Conversant. Not depressed or anxious appearing.  Calm demeanor.  She has a fine rapid tremor that is present on rest and with intentioanl movement in both hands  Few small bruises on both hands typical of older patients on anticoagulation She also has a skin lesion on her right anterior shin- scaly  Assessment and Plan: Essential tremor - Plan: propranolol (INDERAL) 10 MG tablet, Comprehensive metabolic panel, TSH  Easy bruising - Plan: CBC with Differential/Platelet  Benign essential HTN  Here today with concern of tremor and easy bruising Suspect bruising is due to skin changes with age and aspirin use; will obtain CBC and she will let me know if any change in her sx Tremor- present for some time.  Likely a benign essential tremor which may be potentiated by her use of prozac and trazodone.  For the time being she does not want to see a neurologist, but would like to try treatment with inderal prn.  Will also check her CMP and TSH as above She will let me know how the inderal works for her BP likely high due to stress as above- continue current medications  Meds ordered this encounter  Medications  . propranolol (INDERAL) 10 MG tablet    Sig: Use every 8- 12 hours as needed for tremor    Dispense:  60 tablet    Refill:  6    Signed Lamar Blinks, MD  Pt is NOT set up on mychart- use paper for now  Received her labs and gave her a call 2/27-left detailed message on her cell phone. Labs are normal except for new, and significant anemia. We need to arrange short term follow-up to repeat and confirm as this is unexpected.  Will place a lab order- please come in for a CBC only in the next week.  Results  for orders placed or performed in visit on 01/02/17  CBC with Differential/Platelet  Result Value Ref Range   WBC 5.6 4.0 - 10.5 K/uL   RBC 3.65 (L) 3.87 - 5.11 Mil/uL   Hemoglobin 8.3 Repeated and verified X2. (L) 12.0 - 15.0 g/dL   HCT 26.5 (L) 36.0 - 46.0 %   MCV 72.7 (L) 78.0 - 100.0 fl   MCHC 31.1 30.0 - 36.0 g/dL   RDW 14.7 11.5 - 15.5 %   Platelets 300.0 150.0 - 400.0 K/uL   Neutrophils Relative % 59.4 43.0 - 77.0 %   Lymphocytes Relative 28.8 12.0 - 46.0 %   Monocytes Relative 9.7 3.0 - 12.0 %   Eosinophils Relative 1.7 0.0 - 5.0 %   Basophils Relative 0.4 0.0 - 3.0 %   Neutro Abs 3.3 1.4 - 7.7 K/uL   Lymphs Abs 1.6 0.7 - 4.0 K/uL   Monocytes Absolute 0.5 0.1 - 1.0 K/uL   Eosinophils Absolute 0.1 0.0 - 0.7 K/uL   Basophils Absolute 0.0 0.0 - 0.1 K/uL  Comprehensive metabolic panel  Result Value Ref Range   Sodium 138 135 - 145 mEq/L   Potassium 4.6 3.5 - 5.1 mEq/L   Chloride 102 96 - 112 mEq/L   CO2 29 19 - 32 mEq/L   Glucose, Bld 97 70 - 99 mg/dL   BUN 19 6 - 23 mg/dL   Creatinine, Ser 1.00 0.40 - 1.20 mg/dL   Total Bilirubin 0.2 0.2 - 1.2 mg/dL   Alkaline Phosphatase 57 39 - 117 U/L   AST 12 0 - 37 U/L   ALT 10 0 - 35 U/L   Total Protein 6.6 6.0 - 8.3 g/dL   Albumin 4.0 3.5 - 5.2 g/dL   Calcium 9.3 8.4 - 10.5 mg/dL   GFR 58.72 (L) >60.00 mL/min  TSH  Result Value Ref Range   TSH 1.45 0.35 - 4.50 uIU/mL   Called and spoke with her on 2/28- she will come in this week for repeat CBC and ferritin level.  The inderal is helping with her tremor

## 2017-01-02 NOTE — Progress Notes (Signed)
Pre visit review using our clinic review tool, if applicable. No additional management support is needed unless otherwise documented below in the visit note. 

## 2017-01-03 NOTE — Addendum Note (Signed)
Addended by: Darreld Mclean on: 01/03/2017 07:28 PM   Modules accepted: Orders

## 2017-01-04 NOTE — Addendum Note (Signed)
Addended by: Lamar Blinks C on: 01/04/2017 07:25 PM   Modules accepted: Orders

## 2017-01-06 ENCOUNTER — Telehealth: Payer: Self-pay | Admitting: Family Medicine

## 2017-01-06 ENCOUNTER — Other Ambulatory Visit (INDEPENDENT_AMBULATORY_CARE_PROVIDER_SITE_OTHER): Payer: Medicare Other

## 2017-01-06 DIAGNOSIS — D509 Iron deficiency anemia, unspecified: Secondary | ICD-10-CM

## 2017-01-06 LAB — CBC
HCT: 25.8 % — ABNORMAL LOW (ref 36.0–46.0)
Hemoglobin: 8.1 g/dL — ABNORMAL LOW (ref 12.0–15.0)
MCHC: 31.2 g/dL (ref 30.0–36.0)
MCV: 72.9 fl — ABNORMAL LOW (ref 78.0–100.0)
Platelets: 264 10*3/uL (ref 150.0–400.0)
RBC: 3.54 Mil/uL — ABNORMAL LOW (ref 3.87–5.11)
RDW: 14.6 % (ref 11.5–15.5)
WBC: 5.7 10*3/uL (ref 4.0–10.5)

## 2017-01-06 LAB — FERRITIN: Ferritin: 5.1 ng/mL — ABNORMAL LOW (ref 10.0–291.0)

## 2017-01-06 MED ORDER — FERROUS SULFATE 325 (65 FE) MG PO TABS: 325.0000 mg | ORAL_TABLET | Freq: Two times a day (BID) | ORAL | 0 refills | Status: DC

## 2017-01-06 NOTE — Telephone Encounter (Signed)
She is aware that if she is not feeling well or has any evidence of acute GI bleed she should call me or go to the ER

## 2017-01-06 NOTE — Telephone Encounter (Signed)
Called her- she had been taking 81 mg of aspirin only as of late, but she stopped taking this even this when we had this concern of anemia.   She is also not taking NSAIDs more than on occasion She denies blood in her stool, melena, emesis of blood or coffee grounds, abd pain/ gastritis sx or other GI complaints, or other bleeding We will start her on iron now, and she will bring in a stool sample for testing on Monday  Results for orders placed or performed in visit on 01/06/17  CBC  Result Value Ref Range   WBC 5.7 4.0 - 10.5 K/uL   RBC 3.54 (L) 3.87 - 5.11 Mil/uL   Platelets 264.0 150.0 - 400.0 K/uL   Hemoglobin 8.1 Repeated and verified X2. (L) 12.0 - 15.0 g/dL   HCT 25.8 (L) 36.0 - 46.0 %   MCV 72.9 Repeated and verified X2. (L) 78.0 - 100.0 fl   MCHC 31.2 30.0 - 36.0 g/dL   RDW 14.6 11.5 - 15.5 %  Ferritin  Result Value Ref Range   Ferritin 5.1 (L) 10.0 - 291.0 ng/mL

## 2017-01-09 ENCOUNTER — Other Ambulatory Visit: Payer: Self-pay | Admitting: Emergency Medicine

## 2017-01-09 ENCOUNTER — Ambulatory Visit: Payer: Medicare Other | Admitting: Family Medicine

## 2017-01-09 DIAGNOSIS — K921 Melena: Secondary | ICD-10-CM

## 2017-01-11 ENCOUNTER — Telehealth: Payer: Self-pay | Admitting: Family Medicine

## 2017-01-11 ENCOUNTER — Other Ambulatory Visit (INDEPENDENT_AMBULATORY_CARE_PROVIDER_SITE_OTHER): Payer: Medicare Other

## 2017-01-11 DIAGNOSIS — D509 Iron deficiency anemia, unspecified: Secondary | ICD-10-CM

## 2017-01-11 DIAGNOSIS — K921 Melena: Secondary | ICD-10-CM | POA: Diagnosis not present

## 2017-01-11 LAB — FECAL OCCULT BLOOD, IMMUNOCHEMICAL: Fecal Occult Bld: NEGATIVE

## 2017-01-11 NOTE — Telephone Encounter (Signed)
Called pt- she is taking her iron, her stool was negative for blood.  She is feeling fine.  Plan to repeat her CBC in 2 weeks- however if any symptoms such as malaise, weakness in the meantime she will contact us

## 2017-02-09 ENCOUNTER — Other Ambulatory Visit (INDEPENDENT_AMBULATORY_CARE_PROVIDER_SITE_OTHER): Payer: Medicare Other

## 2017-02-09 DIAGNOSIS — D509 Iron deficiency anemia, unspecified: Secondary | ICD-10-CM | POA: Diagnosis not present

## 2017-02-09 LAB — CBC
HCT: 29.6 % — ABNORMAL LOW (ref 36.0–46.0)
Hemoglobin: 9.4 g/dL — ABNORMAL LOW (ref 12.0–15.0)
MCHC: 31.6 g/dL (ref 30.0–36.0)
MCV: 77.2 fl — ABNORMAL LOW (ref 78.0–100.0)
Platelets: 235 10*3/uL (ref 150.0–400.0)
RBC: 3.84 Mil/uL — ABNORMAL LOW (ref 3.87–5.11)
RDW: 21 % — ABNORMAL HIGH (ref 11.5–15.5)
WBC: 5.3 10*3/uL (ref 4.0–10.5)

## 2017-02-11 ENCOUNTER — Telehealth: Payer: Self-pay | Admitting: Family Medicine

## 2017-02-11 DIAGNOSIS — D509 Iron deficiency anemia, unspecified: Secondary | ICD-10-CM

## 2017-02-11 MED ORDER — FERROUS SULFATE 325 (65 FE) MG PO TABS: 325.0000 mg | ORAL_TABLET | Freq: Two times a day (BID) | ORAL | 3 refills | Status: DC

## 2017-02-11 NOTE — Telephone Encounter (Signed)
Called and LMOM- her hg and MCV are responding to the iron treatment.  I am going to refill this- please continue to take the iron and let's repeat her labs in one month,.  Call if any question

## 2017-02-15 ENCOUNTER — Other Ambulatory Visit: Payer: Self-pay | Admitting: Family Medicine

## 2017-02-15 DIAGNOSIS — E785 Hyperlipidemia, unspecified: Secondary | ICD-10-CM

## 2017-02-15 DIAGNOSIS — D509 Iron deficiency anemia, unspecified: Secondary | ICD-10-CM

## 2017-02-15 MED ORDER — ATORVASTATIN CALCIUM 20 MG PO TABS: 20.0000 mg | ORAL_TABLET | Freq: Every day | ORAL | 3 refills | Status: DC

## 2017-02-17 DIAGNOSIS — Z124 Encounter for screening for malignant neoplasm of cervix: Secondary | ICD-10-CM | POA: Diagnosis not present

## 2017-02-17 DIAGNOSIS — Z6821 Body mass index (BMI) 21.0-21.9, adult: Secondary | ICD-10-CM | POA: Diagnosis not present

## 2017-02-17 DIAGNOSIS — Z1231 Encounter for screening mammogram for malignant neoplasm of breast: Secondary | ICD-10-CM | POA: Diagnosis not present

## 2017-02-27 ENCOUNTER — Other Ambulatory Visit (INDEPENDENT_AMBULATORY_CARE_PROVIDER_SITE_OTHER): Payer: Medicare Other

## 2017-02-27 DIAGNOSIS — E119 Type 2 diabetes mellitus without complications: Secondary | ICD-10-CM

## 2017-02-27 LAB — COMPREHENSIVE METABOLIC PANEL
ALT: 10 U/L (ref 0–35)
AST: 12 U/L (ref 0–37)
Albumin: 4.1 g/dL (ref 3.5–5.2)
Alkaline Phosphatase: 55 U/L (ref 39–117)
BUN: 17 mg/dL (ref 6–23)
CO2: 29 mEq/L (ref 19–32)
Calcium: 9.3 mg/dL (ref 8.4–10.5)
Chloride: 102 mEq/L (ref 96–112)
Creatinine, Ser: 1.12 mg/dL (ref 0.40–1.20)
GFR: 51.5 mL/min — ABNORMAL LOW (ref >60.00)
Glucose, Bld: 136 mg/dL — ABNORMAL HIGH (ref 70–99)
Potassium: 4.6 mEq/L (ref 3.5–5.1)
Sodium: 138 mEq/L (ref 135–145)
Total Bilirubin: 0.2 mg/dL (ref 0.2–1.2)
Total Protein: 6.6 g/dL (ref 6.0–8.3)

## 2017-02-27 LAB — HEMOGLOBIN A1C: Hgb A1c MFr Bld: 5.8 % (ref 4.6–6.5)

## 2017-03-02 NOTE — Progress Notes (Signed)
Patient ID: Abigail [name], female   DOB: 1951/08/08, 66 y.o.   MRN: [id number]   Reason for Appointment : Followup for Type 2 Diabetes  History of Present Illness          Diagnosis: Type 2 diabetes mellitus, date of diagnosis: 2004 ?        Past history: She initially had nonspecific symptoms of shakiness at diagnosis and had only mild hyperglycemia. Subsequently was started on metformin low doses for treatment The dose of metformin was gradually increased and she thinks she has been taking 1000 mg twice a day for at least a year. She did have nausea with increasing the dose; however this is better now although not resolved. She probably tried Byetta several years ago but she believes it did not help her control. She has been managed with metformin alone for the last year and a half but blood sugars have been progressively higher.  Blood sugars prior to her initial consultation were mostly over 200, up to 315 and her A1c was 10.3% Her blood sugars had improved with adding Victoza Since 9/14 Subsequently glipizide was stopped  Recent history:     Non-insulin hypoglycemic drugs: Metformin 1 g twice a day, Victoza 1.2 mg daily  Her A1c is fairly stable in the upper normal range, now 5.8  Current blood sugar patterns and management, problems as follows:  Her blood sugars at home are averaging 136, higher than expected for her A1c  She has continued her regimen unchanged from the last visit including Victoza  She does have sporadic high blood sugars after meals especially after breakfast depending on her carbohydrate intake, highest reading 229  Usually blood sugars in the evenings are fairly good with only a few readings recently  She has not done much formal exercise although with being more active last evening her sugar was better this morning at 86  Otherwise fasting readings are mildly increased above normal  Side effects from medications: She did have nausea  with Amaryl  Glucose monitoring:  one  times a day      Glucometer:  One Touch verio Blood Glucose readings from meter review recently:   Mean values apply above for all meters except median for One Touch  PRE-MEAL Fasting Lunch 6 PM  Bedtime Overall  Glucose range:  86-1 26   104-167     Mean/median:     136    POST-MEAL PC Breakfast PC Lunch PC Dinner  Glucose range: 10 7-229   111-156   Mean/median: 129       Hypoglycemia: None      Self-care: The diet that the patient has been following [id number] to limit fat intake, portions and sweets. Will have fruit for snacks.  Breakfast may be oatmeal or sometimes cereal or peanut butter toast, occasionally eggs/bacon and toast Eating out 2-3 times a week, rarely fast food      Exercise: Walking at work mostly Dietician visit: Most recent: Years ago. Has seen nurse educator.           Wt Readings from Last 3 Encounters:  03/03/17 132 lb (59.9 kg)  01/02/17 134 lb 3.2 oz (60.9 kg)  10/28/16 128 lb 8 oz (58.3 kg)  . LABS:  Lab Results  Component Value Date   HGBA1C 5.8 02/27/2017   HGBA1C 6.2 10/25/2016   HGBA1C 6.1 06/23/2016   Lab Results  Component Value Date   MICROALBUR 6.7 (H) 10/25/2016   LDLCALC 62 10/25/2016  CREATININE 1.12 02/27/2017    Other problems see review of systems   Allergies as of 03/03/2017      Reactions   Pneumovax 23 [pneumococcal Vac Polyvalent]    Pt had severe arm pain   Amaryl [glimepiride] Nausea Only   Cephalexin Hives   Sulfa Antibiotics Other (See Comments)   Unknown by pt      Medication List       Accurate as of 03/03/17  3:57 PM. Always use your most recent med list.          aspirin EC 325 MG tablet Take 1 tablet (325 mg total) by mouth 2 (two) times daily.   atorvastatin 20 MG tablet Commonly known as:  LIPITOR Take 1 tablet (20 mg total) by mouth daily.   ferrous sulfate 325 (65 FE) MG tablet Take 1 tablet (325 mg total) by mouth 2 (two) times daily with a meal.     ferrous sulfate 325 (65 FE) MG tablet TAKE 1 TABLET(325 MG) BY MOUTH TWICE DAILY WITH A MEAL   FLUoxetine 20 MG capsule Commonly known as:  PROZAC Take 1 capsule (20 mg total) by mouth daily.   glucose blood test strip Commonly known as:  ONETOUCH VERIO TEST BLOOD SUGARS TWICE DAILY.   liraglutide 18 MG/3ML Sopn Commonly known as:  VICTOZA INJECT 1.2 MG INTO THE SKIN DAILY   losartan 25 MG tablet Commonly known as:  COZAAR Take 1 tablet (25 mg total) by mouth daily.   metFORMIN 1000 MG tablet Commonly known as:  GLUCOPHAGE TAKE 1 TABLET BY MOUTH TWICE DAILY WITH A MEAL.   omeprazole 20 MG capsule Commonly known as:  PRILOSEC TAKE 1 CAPSULE(20 MG) BY MOUTH DAILY   propranolol 10 MG tablet Commonly known as:  INDERAL Use every 8- 12 hours as needed for tremor   traZODone 150 MG tablet Commonly known as:  DESYREL Take 1 tablet (150 mg total) by mouth at bedtime.       Allergies:  Allergies  Allergen Reactions  . Pneumovax 23 [Pneumococcal Vac Polyvalent]     Pt had severe arm pain  . Amaryl [Glimepiride] Nausea Only  . Cephalexin Hives  . Sulfa Antibiotics Other (See Comments)    Unknown by pt    Past Medical History:  Diagnosis Date  . Anxiety   . Depression   . Diabetes mellitus    Type 2  . GERD (gastroesophageal reflux disease)   . Hematuria   . Hyperlipidemia   . Hypertension     Past Surgical History:  Procedure Laterality Date  . CHOLECYSTECTOMY N/A 06/03/2015   Procedure: LAPAROSCOPIC CHOLECYSTECTOMY WITH INTRAOPERATIVE CHOLANGIOGRAM;  Surgeon: Donnie Mesa, MD;  Location: [facility];  Service: General;  Laterality: N/A;  . COLONOSCOPY    . FRACTURE SURGERY    . HAMMER TOE SURGERY    . ORIF ANKLE FRACTURE Left 11/17/2014   Procedure: OPEN REDUCTION INTERNAL FIXATION (ORIF) LEFT TRIMALLEOLAR ANKLE FRACTURE;  Surgeon: Marianna Payment, MD;  Location: [facility];  Service: Orthopedics;  Laterality: Left;  . OVARIAN CYST REMOVAL      Family History   Problem Relation Age of Onset  . Diabetes Mother   . Hyperlipidemia Mother   . Diabetes Father   . Heart disease Father 82    AMI x 2; first AMI age 74  . Stroke Father   . Heart disease Brother     defibrillator; CHF  . Hyperlipidemia Brother     Social History:  reports  that she has never smoked. She has never used smokeless tobacco. She reports that she drinks about 0.6 oz of alcohol per week . She reports that she does not use drugs.    Review of Systems   She has a history of sinus tachycardia of unclear etiology  Now taking Inderal from PCP since she has had some essential tremor also       Lipids:  on treatment with Lipitor for years, LDL is as follows   Lab Results  Component Value Date   CHOL 150 10/25/2016   HDL 66.40 10/25/2016   LDLCALC 62 10/25/2016   TRIG 112.0 10/25/2016   CHOLHDL 2 10/25/2016       The blood pressure has been treated with 25 mg Cozaar for "kidney protection" from her PCP.   Last diabetic foot exam was in 4/18  Eye Dr is Dr Nicki Reaper, Last exam 3/17, Report not available.    LABS:  Lab on 02/27/2017  Component Date Value Ref Range Status  . Hgb A1c MFr Bld 02/27/2017 5.8  4.6 - 6.5 % Final  . Sodium 02/27/2017 138  135 - 145 mEq/L Final  . Potassium 02/27/2017 4.6  3.5 - 5.1 mEq/L Final  . Chloride 02/27/2017 102  96 - 112 mEq/L Final  . CO2 02/27/2017 29  19 - 32 mEq/L Final  . Glucose, Bld 02/27/2017 136* 70 - 99 mg/dL Final  . BUN 02/27/2017 17  6 - 23 mg/dL Final  . Creatinine, Ser 02/27/2017 1.12  0.40 - 1.20 mg/dL Final  . Total Bilirubin 02/27/2017 0.2  0.2 - 1.2 mg/dL Final  . Alkaline Phosphatase 02/27/2017 55  39 - 117 U/L Final  . AST 02/27/2017 12  0 - 37 U/L Final  . ALT 02/27/2017 10  0 - 35 U/L Final  . Total Protein 02/27/2017 6.6  6.0 - 8.3 g/dL Final  . Albumin 02/27/2017 4.1  3.5 - 5.2 g/dL Final  . Calcium 02/27/2017 9.3  8.4 - 10.5 mg/dL Final  . GFR 02/27/2017 51.50* >60.00 mL/min Final    Physical  Examination:  BP 120/70   Pulse 96   Ht 5\' 5"  (1.651 m)   Wt 132 lb (59.9 kg)   BMI 21.97 kg/m      ASSESSMENT/PLAN:  Diabetes type 2, BMI 22   Her diabetes is continuing to be well controlled with her regimen of metformin With Victoza 1.2 mg A1c excellent at 5.8 Although she does have fairly good readings early in the morning she may have high readings after breakfast with eating more carbohydrate and less protein Also she thinks her blood sugars are overall better when she tries to walk regularly for exercise  She will continue the same regimen  Encouraged her to have a consistent exercise program also She will add protein at breakfast, discussed options  She will follow-up again in 4 months   Patient Instructions  Walk more  Protein at [facility] 03/03/2017, 3:57 PM

## 2017-03-03 ENCOUNTER — Ambulatory Visit (INDEPENDENT_AMBULATORY_CARE_PROVIDER_SITE_OTHER): Payer: Medicare Other | Admitting: Endocrinology

## 2017-03-03 ENCOUNTER — Encounter: Payer: Self-pay | Admitting: Endocrinology

## 2017-03-03 VITALS — BP 120/70 | HR 96 | Ht 65.0 in | Wt 132.0 lb

## 2017-03-03 DIAGNOSIS — E1165 Type 2 diabetes mellitus with hyperglycemia: Secondary | ICD-10-CM | POA: Diagnosis not present

## 2017-03-03 NOTE — Patient Instructions (Signed)
Walk more  Protein at [facility]

## 2017-03-15 ENCOUNTER — Other Ambulatory Visit: Payer: Self-pay | Admitting: Family Medicine

## 2017-03-15 DIAGNOSIS — I10 Essential (primary) hypertension: Secondary | ICD-10-CM

## 2017-03-30 ENCOUNTER — Other Ambulatory Visit: Payer: Self-pay | Admitting: Family Medicine

## 2017-06-02 ENCOUNTER — Other Ambulatory Visit: Payer: Self-pay | Admitting: Family Medicine

## 2017-06-26 ENCOUNTER — Other Ambulatory Visit: Payer: Self-pay | Admitting: Family Medicine

## 2017-06-29 ENCOUNTER — Other Ambulatory Visit: Payer: Medicare Other

## 2017-07-04 ENCOUNTER — Ambulatory Visit: Payer: Medicare Other | Admitting: Endocrinology

## 2017-07-12 ENCOUNTER — Other Ambulatory Visit: Payer: Self-pay | Admitting: Family Medicine

## 2017-07-12 DIAGNOSIS — F329 Major depressive disorder, single episode, unspecified: Secondary | ICD-10-CM

## 2017-07-12 DIAGNOSIS — F32A Depression, unspecified: Secondary | ICD-10-CM

## 2017-08-01 ENCOUNTER — Other Ambulatory Visit: Payer: Self-pay | Admitting: Family Medicine

## 2017-08-01 DIAGNOSIS — G250 Essential tremor: Secondary | ICD-10-CM

## 2017-08-04 ENCOUNTER — Other Ambulatory Visit: Payer: Self-pay | Admitting: Family Medicine

## 2017-08-04 DIAGNOSIS — G250 Essential tremor: Secondary | ICD-10-CM

## 2017-08-07 DIAGNOSIS — I340 Nonrheumatic mitral (valve) insufficiency: Secondary | ICD-10-CM

## 2017-08-07 HISTORY — DX: Nonrheumatic mitral (valve) insufficiency: I34.0

## 2017-08-09 NOTE — Progress Notes (Deleted)
[place] at [facility] [address] [place], [place] [address] [phone]  Date:  08/10/2017   Name:  Abigail [name]   DOB:  1951/02/19   MRN:  [id number]  PCP:  Darreld Mclean, MD    Chief Complaint: No chief complaint on file.   History of Present Illness:  Abigail [name] is a 66 y.o. very pleasant female patient who presents with the following:  History of DM, anxiety Here today for a follow-up visit She sees Dr. Dwyane Dee for her DM management I last saw her in February of this year Lab Results  Component Value Date   HGBA1C 5.8 02/27/2017   Mammo: Flu shot: Eye exam: Foot exam:  BP Readings from Last 3 Encounters:  03/03/17 120/70  01/02/17 (!) 164/82  10/28/16 122/60     Patient Active Problem List   Diagnosis Date Noted  . Microscopic hematuria 09/16/2015  . Trimalleolar fracture of ankle, closed 11/17/2014  . Other and unspecified hyperlipidemia 07/24/2013  . Chest pain 10/23/2012  . Type II or unspecified type diabetes mellitus without mention of complication, uncontrolled 10/08/2012  . Anxiety 10/08/2012    Past Medical History:  Diagnosis Date  . Anxiety   . Depression   . Diabetes mellitus    Type 2  . GERD (gastroesophageal reflux disease)   . Hematuria   . Hyperlipidemia   . Hypertension     Past Surgical History:  Procedure Laterality Date  . CHOLECYSTECTOMY N/A 06/03/2015   Procedure: LAPAROSCOPIC CHOLECYSTECTOMY WITH INTRAOPERATIVE CHOLANGIOGRAM;  Surgeon: Donnie Mesa, MD;  Location: [place];  Service: General;  Laterality: N/A;  . COLONOSCOPY    . FRACTURE SURGERY    . HAMMER TOE SURGERY    . ORIF ANKLE FRACTURE Left 11/17/2014   Procedure: OPEN REDUCTION INTERNAL FIXATION (ORIF) LEFT TRIMALLEOLAR ANKLE FRACTURE;  Surgeon: Marianna Payment, MD;  Location: [place];  Service: Orthopedics;  Laterality: Left;  . OVARIAN CYST REMOVAL      Social History  Substance Use  Topics  . Smoking status: Never Smoker  . Smokeless tobacco: Never Used  . Alcohol use 0.6 oz/week    1 Glasses of wine per week    Family History  Problem Relation Age of Onset  . Diabetes Mother   . Hyperlipidemia Mother   . Diabetes Father   . Heart disease Father 46       AMI x 2; first AMI age 8  . Stroke Father   . Heart disease Brother        defibrillator; CHF  . Hyperlipidemia Brother     Allergies  Allergen Reactions  . Pneumovax 23 [Pneumococcal Vac Polyvalent]     Pt had severe arm pain  . Amaryl [Glimepiride] Nausea Only  . Cephalexin Hives  . Sulfa Antibiotics Other (See Comments)    Unknown by pt    Medication list has been reviewed and updated.  Current Outpatient Prescriptions on File Prior to Visit  Medication Sig Dispense Refill  . aspirin EC 325 MG tablet Take 1 tablet (325 mg total) by mouth 2 (two) times daily. (Patient not taking: Reported on 03/03/2017) 84 tablet 0  . atorvastatin (LIPITOR) 20 MG tablet Take 1 tablet (20 mg total) by mouth daily. 90 tablet 3  . ferrous sulfate 325 (65 FE) MG tablet Take 1 tablet (325 mg total) by mouth 2 (two) times daily with a meal. 60 tablet 3  .  ferrous sulfate 325 (65 FE) MG tablet TAKE 1 TABLET(325 MG) BY MOUTH TWICE DAILY WITH A MEAL (Patient not taking: Reported on 03/03/2017) 60 tablet 2  . FLUoxetine (PROZAC) 20 MG capsule Take 1 capsule (20 mg total) by mouth daily. 90 capsule 3  . FLUoxetine (PROZAC) 20 MG capsule TAKE 1 CAPSULE(20 MG) BY MOUTH DAILY 90 capsule 0  . glucose blood (ONETOUCH VERIO) test strip TEST BLOOD SUGARS TWICE DAILY. 200 each 2  . liraglutide (VICTOZA) 18 MG/3ML SOPN INJECT 1.2 MG INTO THE SKIN DAILY 18 mL 1  . losartan (COZAAR) 25 MG tablet TAKE 1 TABLET(25 MG) BY MOUTH DAILY 90 tablet 1  . metFORMIN (GLUCOPHAGE) 1000 MG tablet TAKE 1 TABLET BY MOUTH TWICE DAILY WITH A MEAL. 180 tablet 3  . omeprazole (PRILOSEC) 20 MG capsule Take 1 capsule (20 mg total) by mouth daily. 90 capsule 0   . propranolol (INDERAL) 10 MG tablet TAKE 1 TABLET BY MOUTH EVERY 8 TO 12 HOURS AS NEEDED FOR TREMOR 60 tablet 0  . traZODone (DESYREL) 150 MG tablet Take 1 tablet (150 mg total) by mouth at bedtime. 90 tablet 0   No current facility-administered medications on file prior to visit.     Review of Systems:  As per HPI- otherwise negative.   Physical Examination: There were no vitals filed for this visit. There were no vitals filed for this visit. There is no height or weight on file to calculate BMI. Ideal Body Weight:    GEN: WDWN, NAD, Non-toxic, A & O x 3 HEENT: Atraumatic, Normocephalic. Neck supple. No masses, No LAD. Ears and Nose: No external deformity. CV: RRR, No M/G/R. No JVD. No thrill. No extra heart sounds. PULM: CTA B, no wheezes, crackles, rhonchi. No retractions. No resp. distress. No accessory muscle use. ABD: S, NT, ND, +BS. No rebound. No HSM. EXTR: No c/c/e NEURO Normal gait.  PSYCH: Normally interactive. Conversant. Not depressed or anxious appearing.  Calm demeanor.    Assessment and Plan: ***  Signed Lamar Blinks, MD

## 2017-08-10 ENCOUNTER — Ambulatory Visit: Payer: Medicare Other | Admitting: Family Medicine

## 2017-08-10 ENCOUNTER — Other Ambulatory Visit: Payer: Self-pay | Admitting: Family Medicine

## 2017-08-10 DIAGNOSIS — Z0289 Encounter for other administrative examinations: Secondary | ICD-10-CM

## 2017-08-10 DIAGNOSIS — F320 Major depressive disorder, single episode, mild: Secondary | ICD-10-CM

## 2017-08-10 MED ORDER — FLUOXETINE HCL 20 MG PO CAPS: 20.0000 mg | ORAL_CAPSULE | Freq: Every day | ORAL | 3 refills | Status: DC

## 2017-08-10 NOTE — Telephone Encounter (Signed)
Taken care of

## 2017-08-10 NOTE — Telephone Encounter (Signed)
Caller name: Katlin  Relation to pt: self Call back number: [phone] Pharmacy:  [facility] [place], [place] AT [address]  Reason for call: Pt came in office at 10:30 thinking that her appt was at 10:30 (appt was at 10:00-10-02-2017) pt had to reschedule and states is needing a refill on Fluoxetine 20 mg Capsules take one capsule by mouth daily. Pt states is completely out of meds needing ASAP. Please advise.

## 2017-08-17 ENCOUNTER — Ambulatory Visit (INDEPENDENT_AMBULATORY_CARE_PROVIDER_SITE_OTHER): Payer: Medicare Other | Admitting: Family Medicine

## 2017-08-17 ENCOUNTER — Observation Stay (HOSPITAL_BASED_OUTPATIENT_CLINIC_OR_DEPARTMENT_OTHER)
Admission: EM | Admit: 2017-08-17 | Discharge: 2017-08-18 | Disposition: A | Discharge status: Home / Self Care | Payer: Medicare Other | Attending: Internal Medicine | Admitting: Internal Medicine

## 2017-08-17 ENCOUNTER — Encounter (HOSPITAL_BASED_OUTPATIENT_CLINIC_OR_DEPARTMENT_OTHER): Payer: Self-pay | Admitting: Emergency Medicine

## 2017-08-17 ENCOUNTER — Encounter: Payer: Self-pay | Admitting: Family Medicine

## 2017-08-17 ENCOUNTER — Emergency Department (HOSPITAL_BASED_OUTPATIENT_CLINIC_OR_DEPARTMENT_OTHER): Payer: Medicare Other

## 2017-08-17 VITALS — BP 126/70 | HR 90 | Temp 98.6°F | Resp 18 | Ht 65.0 in | Wt 135.0 lb

## 2017-08-17 DIAGNOSIS — E119 Type 2 diabetes mellitus without complications: Secondary | ICD-10-CM | POA: Insufficient documentation

## 2017-08-17 DIAGNOSIS — M542 Cervicalgia: Secondary | ICD-10-CM | POA: Diagnosis not present

## 2017-08-17 DIAGNOSIS — Z23 Encounter for immunization: Secondary | ICD-10-CM | POA: Diagnosis not present

## 2017-08-17 DIAGNOSIS — K219 Gastro-esophageal reflux disease without esophagitis: Secondary | ICD-10-CM

## 2017-08-17 DIAGNOSIS — R079 Chest pain, unspecified: Secondary | ICD-10-CM | POA: Diagnosis not present

## 2017-08-17 DIAGNOSIS — Z8249 Family history of ischemic heart disease and other diseases of the circulatory system: Secondary | ICD-10-CM

## 2017-08-17 DIAGNOSIS — M549 Dorsalgia, unspecified: Secondary | ICD-10-CM | POA: Diagnosis not present

## 2017-08-17 DIAGNOSIS — Z7984 Long term (current) use of oral hypoglycemic drugs: Secondary | ICD-10-CM | POA: Insufficient documentation

## 2017-08-17 DIAGNOSIS — R51 Headache: Secondary | ICD-10-CM | POA: Insufficient documentation

## 2017-08-17 DIAGNOSIS — G250 Essential tremor: Secondary | ICD-10-CM | POA: Diagnosis not present

## 2017-08-17 DIAGNOSIS — I10 Essential (primary) hypertension: Secondary | ICD-10-CM | POA: Insufficient documentation

## 2017-08-17 DIAGNOSIS — D509 Iron deficiency anemia, unspecified: Secondary | ICD-10-CM

## 2017-08-17 DIAGNOSIS — E782 Mixed hyperlipidemia: Secondary | ICD-10-CM | POA: Diagnosis not present

## 2017-08-17 DIAGNOSIS — E785 Hyperlipidemia, unspecified: Secondary | ICD-10-CM | POA: Diagnosis not present

## 2017-08-17 DIAGNOSIS — F329 Major depressive disorder, single episode, unspecified: Secondary | ICD-10-CM

## 2017-08-17 DIAGNOSIS — I200 Unstable angina: Secondary | ICD-10-CM | POA: Diagnosis not present

## 2017-08-17 DIAGNOSIS — R072 Precordial pain: Principal | ICD-10-CM | POA: Insufficient documentation

## 2017-08-17 LAB — CBC
HCT: 32.7 % — ABNORMAL LOW (ref 36.0–46.0)
HEMATOCRIT: 33.3 % — AB (ref 36.0–46.0)
Hemoglobin: 10.8 g/dL — ABNORMAL LOW (ref 12.0–15.0)
Hemoglobin: 10.4 g/dL — ABNORMAL LOW (ref 12.0–15.0)
MCH: 28.1 pg (ref 26.0–34.0)
MCH: 27.2 pg (ref 26.0–34.0)
MCHC: 32.4 g/dL (ref 30.0–36.0)
MCHC: 31.8 g/dL (ref 30.0–36.0)
MCV: 86.7 fL (ref 78.0–100.0)
MCV: 85.4 fL (ref 78.0–100.0)
PLATELETS: 165 10*3/uL (ref 150–400)
PLATELETS: 177 10*3/uL (ref 150–400)
RBC: 3.84 MIL/uL — ABNORMAL LOW (ref 3.87–5.11)
RBC: 3.83 MIL/uL — AB (ref 3.87–5.11)
RDW: 12.7 % (ref 11.5–15.5)
RDW: 12.9 % (ref 11.5–15.5)
WBC: 5.8 10*3/uL (ref 4.0–10.5)
WBC: 6.9 10*3/uL (ref 4.0–10.5)

## 2017-08-17 LAB — COMPREHENSIVE METABOLIC PANEL
ALBUMIN: 4 g/dL (ref 3.5–5.0)
ALT: 11 U/L — ABNORMAL LOW (ref 14–54)
ANION GAP: 8 (ref 5–15)
AST: 17 U/L (ref 15–41)
Alkaline Phosphatase: 56 U/L (ref 38–126)
BILIRUBIN TOTAL: 0.6 mg/dL (ref 0.3–1.2)
BUN: 24 mg/dL — AB (ref 6–20)
CHLORIDE: 100 mmol/L — AB (ref 101–111)
CO2: 27 mmol/L (ref 22–32)
Calcium: 9 mg/dL (ref 8.9–10.3)
Creatinine, Ser: 1.18 mg/dL — ABNORMAL HIGH (ref 0.44–1.00)
GFR calc Af Amer: 54 mL/min — ABNORMAL LOW (ref >60)
GFR calc non Af Amer: 47 mL/min — ABNORMAL LOW (ref >60)
GLUCOSE: 92 mg/dL (ref 65–99)
POTASSIUM: 4.4 mmol/L (ref 3.5–5.1)
SODIUM: 135 mmol/L (ref 135–145)
Total Protein: 7.1 g/dL (ref 6.5–8.1)

## 2017-08-17 LAB — TROPONIN I: Troponin I: <0.03 ng/mL (ref <0.03)

## 2017-08-17 LAB — CREATININE, SERUM
CREATININE: 1.15 mg/dL — AB (ref 0.44–1.00)
GFR, EST AFRICAN AMERICAN: 56 mL/min — AB (ref >60)
GFR, EST NON AFRICAN AMERICAN: 48 mL/min — AB (ref >60)

## 2017-08-17 LAB — GLUCOSE, CAPILLARY
GLUCOSE-CAPILLARY: 114 mg/dL — AB (ref 65–99)
Glucose-Capillary: 74 mg/dL (ref 65–99)

## 2017-08-17 MED ORDER — PROPRANOLOL HCL 10 MG PO TABS
10.0000 mg | ORAL_TABLET | Freq: Two times a day (BID) | ORAL | Status: DC
  Administered 2017-08-18: 10 mg via ORAL
  Filled 2017-08-17 (×3): qty 1

## 2017-08-17 MED ORDER — TRAZODONE HCL 50 MG PO TABS
150.0000 mg | ORAL_TABLET | Freq: Every day | ORAL | Status: DC
  Administered 2017-08-17: 150 mg via ORAL
  Filled 2017-08-17: qty 1

## 2017-08-17 MED ORDER — TRAZODONE HCL 150 MG PO TABS: 150.0000 mg | ORAL_TABLET | Freq: Every day | ORAL | 3 refills | Status: DC

## 2017-08-17 MED ORDER — FLUOXETINE HCL 20 MG PO CAPS
40.0000 mg | ORAL_CAPSULE | Freq: Every day | ORAL | Status: DC
  Administered 2017-08-18: 40 mg via ORAL
  Filled 2017-08-17: qty 2

## 2017-08-17 MED ORDER — PANTOPRAZOLE SODIUM 40 MG PO TBEC
40.0000 mg | DELAYED_RELEASE_TABLET | Freq: Two times a day (BID) | ORAL | Status: DC
  Administered 2017-08-18: 40 mg via ORAL
  Filled 2017-08-17 (×2): qty 1

## 2017-08-17 MED ORDER — OMEPRAZOLE 20 MG PO CPDR: 20.0000 mg | DELAYED_RELEASE_CAPSULE | Freq: Every day | ORAL | 3 refills | Status: DC

## 2017-08-17 MED ORDER — FLUOXETINE HCL 20 MG PO CAPS: ORAL_CAPSULE | ORAL | 3 refills | Status: DC

## 2017-08-17 MED ORDER — MORPHINE SULFATE (PF) 2 MG/ML IV SOLN: 2.0000 mg | INTRAVENOUS | Status: DC | PRN

## 2017-08-17 MED ORDER — ATORVASTATIN CALCIUM 20 MG PO TABS
20.0000 mg | ORAL_TABLET | Freq: Every day | ORAL | Status: DC
  Administered 2017-08-18: 20 mg via ORAL
  Filled 2017-08-17: qty 1

## 2017-08-17 MED ORDER — ACETAMINOPHEN 325 MG PO TABS
650.0000 mg | ORAL_TABLET | Freq: Once | ORAL | Status: AC
  Administered 2017-08-17: 650 mg via ORAL

## 2017-08-17 MED ORDER — LOSARTAN POTASSIUM 25 MG PO TABS: ORAL_TABLET | ORAL | 3 refills | Status: DC

## 2017-08-17 MED ORDER — METFORMIN HCL 1000 MG PO TABS: ORAL_TABLET | ORAL | 3 refills | Status: DC

## 2017-08-17 MED ORDER — ACETAMINOPHEN 325 MG PO TABS
ORAL_TABLET | ORAL | Status: AC
  Administered 2017-08-17: 650 mg via ORAL
  Filled 2017-08-17: qty 2

## 2017-08-17 MED ORDER — PROPRANOLOL HCL 10 MG PO TABS: ORAL_TABLET | ORAL | 5 refills | Status: DC

## 2017-08-17 MED ORDER — ACETAMINOPHEN 325 MG PO TABS: 650.0000 mg | ORAL_TABLET | ORAL | Status: DC | PRN

## 2017-08-17 MED ORDER — FERROUS SULFATE 325 (65 FE) MG PO TABS
325.0000 mg | ORAL_TABLET | Freq: Two times a day (BID) | ORAL | Status: DC
  Administered 2017-08-17 – 2017-08-18 (×3): 325 mg via ORAL
  Filled 2017-08-17 (×3): qty 1

## 2017-08-17 MED ORDER — GI COCKTAIL ~~LOC~~: 30.0000 mL | Freq: Four times a day (QID) | ORAL | Status: DC | PRN

## 2017-08-17 MED ORDER — ATORVASTATIN CALCIUM 20 MG PO TABS: 20.0000 mg | ORAL_TABLET | Freq: Every day | ORAL | 3 refills | Status: DC

## 2017-08-17 MED ORDER — SODIUM CHLORIDE 0.9 % IV SOLN
INTRAVENOUS | Status: DC
  Administered 2017-08-17 – 2017-08-18 (×2): via INTRAVENOUS

## 2017-08-17 MED ORDER — ASPIRIN 81 MG PO CHEW
324.0000 mg | CHEWABLE_TABLET | Freq: Once | ORAL | Status: AC
  Administered 2017-08-17: 324 mg via ORAL
  Filled 2017-08-17: qty 4

## 2017-08-17 MED ORDER — NITROGLYCERIN 0.4 MG SL SUBL
0.4000 mg | SUBLINGUAL_TABLET | SUBLINGUAL | Status: DC | PRN
  Administered 2017-08-17 (×2): 0.4 mg via SUBLINGUAL
  Filled 2017-08-17 (×3): qty 1

## 2017-08-17 MED ORDER — ENOXAPARIN SODIUM 40 MG/0.4ML ~~LOC~~ SOLN
40.0000 mg | SUBCUTANEOUS | Status: DC
  Administered 2017-08-17: 40 mg via SUBCUTANEOUS
  Filled 2017-08-17 (×2): qty 0.4

## 2017-08-17 MED ORDER — ASPIRIN EC 81 MG PO TBEC
81.0000 mg | DELAYED_RELEASE_TABLET | Freq: Two times a day (BID) | ORAL | Status: DC
  Administered 2017-08-17 – 2017-08-18 (×2): 81 mg via ORAL
  Filled 2017-08-17 (×2): qty 1

## 2017-08-17 MED ORDER — ONDANSETRON HCL 4 MG/2ML IJ SOLN: 4.0000 mg | Freq: Four times a day (QID) | INTRAMUSCULAR | Status: DC | PRN

## 2017-08-17 NOTE — ED Triage Notes (Signed)
Pt c/o heaviness to mid chest w/ radiation to bil sides of neck intermittently since yesterday; saw PCP today for med refill and they suggested she come here for further eval.

## 2017-08-17 NOTE — ED Provider Notes (Signed)
[facility] DEPT [facility] Provider Note   CSN: [id number] Arrival date & time: 08/17/17  1103     History   Chief Complaint Chief Complaint  Patient presents with  . Chest Pain    HPI Abigail [name] is a 66 y.o. female.  The history is provided by the patient.  Chest Pain   This is a new problem. The current episode started 2 days ago. Episode frequency: intermittent. Progression since onset: fluctuating. The pain is present in the substernal region. The pain is moderate. The quality of the pain is described as pressure-like and heavy. The pain radiates to the left neck, right neck and upper back. Associated symptoms include headaches. Pertinent negatives include no cough, no diaphoresis, no fever, no hemoptysis, no leg pain, no lower extremity edema, no malaise/fatigue, no nausea, no shortness of breath and no vomiting.  Her past medical history is significant for diabetes, hyperlipidemia and hypertension.  Pertinent negatives for past medical history include no CAD, no DVT, no MI, no PE, no strokes and no TIA.  Her family medical history is significant for early MI.    Past Medical History:  Diagnosis Date  . Anxiety   . Depression   . Diabetes mellitus    Type 2  . GERD (gastroesophageal reflux disease)   . Hematuria   . Hyperlipidemia   . Hypertension     Patient Active Problem List   Diagnosis Date Noted  . Microscopic hematuria 09/16/2015  . Trimalleolar fracture of ankle, closed 11/17/2014  . Other and unspecified hyperlipidemia 07/24/2013  . Chest pain 10/23/2012  . Type II or unspecified type diabetes mellitus without mention of complication, uncontrolled 10/08/2012  . Anxiety 10/08/2012    Past Surgical History:  Procedure Laterality Date  . CHOLECYSTECTOMY N/A 06/03/2015   Procedure: LAPAROSCOPIC CHOLECYSTECTOMY WITH INTRAOPERATIVE CHOLANGIOGRAM;  Surgeon: Donnie Mesa, MD;  Location: [place];  Service: General;  Laterality: N/A;  . COLONOSCOPY    .  FRACTURE SURGERY    . HAMMER TOE SURGERY    . ORIF ANKLE FRACTURE Left 11/17/2014   Procedure: OPEN REDUCTION INTERNAL FIXATION (ORIF) LEFT TRIMALLEOLAR ANKLE FRACTURE;  Surgeon: Marianna Payment, MD;  Location: [place];  Service: Orthopedics;  Laterality: Left;  . OVARIAN CYST REMOVAL      OB History    No data available       Home Medications    Prior to Admission medications   Medication Sig Start Date End Date Taking? Authorizing Provider  aspirin EC 325 MG tablet Take 1 tablet (325 mg total) by mouth 2 (two) times daily. Patient not taking: Reported on 03/03/2017 11/17/14   Leandrew Koyanagi, MD  atorvastatin (LIPITOR) 20 MG tablet Take 1 tablet (20 mg total) by mouth daily. 08/17/17   Copland, Gay Filler, MD  ferrous sulfate 325 (65 FE) MG tablet Take 1 tablet (325 mg total) by mouth 2 (two) times daily with a meal. 02/11/17   Copland, Gay Filler, MD  ferrous sulfate 325 (65 FE) MG tablet TAKE 1 TABLET(325 MG) BY MOUTH TWICE DAILY WITH A MEAL Patient not taking: Reported on 03/03/2017 02/15/17   Copland, Gay Filler, MD  FLUoxetine (PROZAC) 20 MG capsule Take 20 or 40 mg daily for depression 08/17/17   Copland, Gay Filler, MD  glucose blood (ONETOUCH VERIO) test strip TEST BLOOD SUGARS TWICE DAILY. 07/06/16   Elayne Snare, MD  liraglutide (VICTOZA) 18 MG/3ML SOPN INJECT 1.2 MG INTO THE SKIN DAILY 12/07/16   Elayne Snare, MD  losartan (COZAAR) 25 MG tablet TAKE 1 TABLET(25 MG) BY MOUTH DAILY 08/17/17   Copland, Gay Filler, MD  metFORMIN (GLUCOPHAGE) 1000 MG tablet TAKE 1 TABLET BY MOUTH TWICE DAILY WITH A MEAL. 08/17/17   Copland, Gay Filler, MD  omeprazole (PRILOSEC) 20 MG capsule Take 1 capsule (20 mg total) by mouth daily. 08/17/17   Copland, Gay Filler, MD  propranolol (INDERAL) 10 MG tablet TAKE 1 TABLET BY MOUTH EVERY 8 TO 12 HOURS AS NEEDED FOR TREMOR 08/17/17   Copland, Gay Filler, MD  traZODone (DESYREL) 150 MG tablet Take 1 tablet (150 mg total) by mouth at bedtime. 08/17/17   Copland, Gay Filler, MD    Family History Family History  Problem Relation Age of Onset  . Diabetes Mother   . Hyperlipidemia Mother   . Diabetes Father   . Heart disease Father 82       AMI x 2; first AMI age 40  . Stroke Father   . Heart disease Brother        defibrillator; CHF  . Hyperlipidemia Brother     Social History Social History  Substance Use Topics  . Smoking status: Never Smoker  . Smokeless tobacco: Never Used  . Alcohol use 0.6 oz/week    1 Glasses of wine per week     Allergies   Pneumovax 23 [pneumococcal vac polyvalent]; Amaryl [glimepiride]; Cephalexin; and Sulfa antibiotics   Review of Systems Review of Systems  Constitutional: Negative for diaphoresis, fever and malaise/fatigue.  Respiratory: Negative for cough, hemoptysis and shortness of breath.   Cardiovascular: Positive for chest pain.  Gastrointestinal: Negative for nausea and vomiting.  Neurological: Positive for headaches.  All other systems are reviewed and are negative for acute change except as noted in the HPI    Physical Exam Updated Vital Signs BP (!) 152/81 (BP Location: Left Arm)   Pulse 80   Temp 98.1 F (36.7 C) (Oral)   Resp 16   SpO2 98%   Physical Exam  Constitutional: She is oriented to person, place, and time. She appears well-developed and well-nourished. No distress.  HENT:  Head: Normocephalic and atraumatic.  Nose: Nose normal.  Eyes: Pupils are equal, round, and reactive to light. Conjunctivae and EOM are normal. Right eye exhibits no discharge. Left eye exhibits no discharge. No scleral icterus.  Neck: Normal range of motion. Neck supple.  Cardiovascular: Normal rate and regular rhythm.  Exam reveals no gallop and no friction rub.   No murmur heard. Pulmonary/Chest: Effort normal and breath sounds normal. No stridor. No respiratory distress. She has no rales.  Abdominal: Soft. She exhibits no distension. There is no tenderness.  Musculoskeletal: She exhibits no edema or  tenderness.  Neurological: She is alert and oriented to person, place, and time.  Skin: Skin is warm and dry. No rash noted. She is not diaphoretic. No erythema.  Psychiatric: She has a normal mood and affect.  Vitals reviewed.    ED Treatments / Results  Labs (all labs ordered are listed, but only abnormal results are displayed) Labs Reviewed - No data to display  EKG  EKG Interpretation None       Radiology No results found.  Procedures Procedures (including critical care time)  Medications Ordered in ED Medications - No data to display   Initial Impression / Assessment and Plan / ED Course  I have reviewed the triage vital signs and the nursing notes.  Pertinent labs & imaging results that were available during my  care of the patient were reviewed by me and considered in my medical decision making (see chart for details).  Clinical Course as of Aug 18 1431  Thu Aug 17, 2017  1208 Atypical chest pain. EKG without acute ischemic changes or evidence of pericarditis. Initial troponin negative. HEART 5. Will require admission for ACS rule out if rest of the workup is unremarkable.  Given aspirin here in the emergency department.  Low pretest probability for pulmonary embolism. Presentation not classic for aortic dissection or esophageal perforation. Chest x-ray without evidence suggestive of pneumonia, pneumothorax, pneumomediastinum.  No abnormal contour of the mediastinum to suggest dissection. No evidence of acute injuries.    [PC]  4037 Chest pain completely resolved with nitroglycerin.  [PC]  1215 Will discuss case with medicine regarding admission for ACS rule out.  [PC]    Clinical Course User Index [PC] Cardama, Grayce Sessions, MD      Final Clinical Impressions(s) / ED Diagnoses   Final diagnoses:  Chest pain      Cardama, Grayce Sessions, MD 08/17/17 1432

## 2017-08-17 NOTE — ED Notes (Signed)
Attempted to call report to 6E; nurse unavailable; this RN contact info provided.

## 2017-08-17 NOTE — Progress Notes (Signed)
66 yo F with HTN, HLD, DM presents with chest pain.  Not clearly exertional.  Radiates to arm.  Went to PCP and was sent to ER.  BP 126/81   Pulse 82   Temp 98.1 F (36.7 C) (Oral)   Resp 16   SpO2 96%   Na 135, K 4.4, Cr 1.18 (baseline 1.1), WBC 5.8, Hgb 10.8 Troponin negative ECG without ST changes Got nitro, pain resolved Has had aspirin 324  To tele, OBS for CP rule out.   Cards notified to see tomorrow.

## 2017-08-17 NOTE — Consult Note (Signed)
Cardiology Consultation:   Patient ID: Abigail [name]; [id number]; April 24, 1951   Admit date: 08/17/2017 Date of Consult: 08/17/2017  Primary Care Provider: Darreld Mclean, MD Primary Cardiologist: Dr. Johnsie Cancel (2013) Primary Electrophysiologist:     Patient Profile:   Abigail [name] is a 66 y.o. female with a hx of DM, HTN, HLD, GERD, depression and anxiety who is being seen today for the evaluation of chest pain at the request of Dr. Loleta Books.  History of Present Illness:   [name] saw Dr. Johnsie Cancel in clinic in 2013 for evaluation of chest pain. At that visit, chest pain was noted to be atypical and exercise treadmill stress test was without ischemia. She states that it was thought to be related to GERD. She has not had chest pain since that visit until yesterday.  She woke up with chest heaviness yesterday 08/16/17. The chest heaviness was not associated with exertion or rest, radiated to her neck bilaterally, and had no aggravating or relieving factors. She denies SOB, palpitations, orthopnea, dizziness, lightheadedness, syncope, diaphoresis and N/V. She went to bed that evening and did not sleep well 2/ chest pressure. She states she has had near constant chest pressure since yesterday morning described as substernal.  She awoke today and went to her PCP who sent her downstairs to ED at [facility]. Initial workup was negative and she was transferred to [facility] for MI rule out. She reports receiving one nitro SL with partial relief. She still has the chest heaviness now.   Troponin x 1 negative and EKG without acute ischemia.   Past Medical History:  Diagnosis Date  . Anxiety   . Depression   . Diabetes mellitus    Type 2  . GERD (gastroesophageal reflux disease)   . Hematuria   . Hyperlipidemia   . Hypertension     Past Surgical History:  Procedure Laterality Date  . CHOLECYSTECTOMY N/A 06/03/2015   Procedure: LAPAROSCOPIC CHOLECYSTECTOMY WITH  INTRAOPERATIVE CHOLANGIOGRAM;  Surgeon: Donnie Mesa, MD;  Location: [place];  Service: General;  Laterality: N/A;  . COLONOSCOPY    . FRACTURE SURGERY    . HAMMER TOE SURGERY    . ORIF ANKLE FRACTURE Left 11/17/2014   Procedure: OPEN REDUCTION INTERNAL FIXATION (ORIF) LEFT TRIMALLEOLAR ANKLE FRACTURE;  Surgeon: Marianna Payment, MD;  Location: [place];  Service: Orthopedics;  Laterality: Left;  . OVARIAN CYST REMOVAL       Home Medications:  Prior to Admission medications   Medication Sig Start Date End Date Taking? Authorizing Provider  aspirin EC 325 MG tablet Take 1 tablet (325 mg total) by mouth 2 (two) times daily. Patient not taking: Reported on 03/03/2017 11/17/14   Leandrew Koyanagi, MD  atorvastatin (LIPITOR) 20 MG tablet Take 1 tablet (20 mg total) by mouth daily. 08/17/17   Copland, Gay Filler, MD  ferrous sulfate 325 (65 FE) MG tablet Take 1 tablet (325 mg total) by mouth 2 (two) times daily with a meal. 02/11/17   Copland, Gay Filler, MD  ferrous sulfate 325 (65 FE) MG tablet TAKE 1 TABLET(325 MG) BY MOUTH TWICE DAILY WITH A MEAL Patient not taking: Reported on 03/03/2017 02/15/17   Copland, Gay Filler, MD  FLUoxetine (PROZAC) 20 MG capsule Take 20 or 40 mg daily for depression 08/17/17   Copland, Gay Filler, MD  glucose blood (ONETOUCH VERIO) test strip TEST BLOOD SUGARS TWICE DAILY. 07/06/16   Elayne Snare, MD  liraglutide (VICTOZA) 18 MG/3ML SOPN INJECT 1.2  MG INTO THE SKIN DAILY 12/07/16   Elayne Snare, MD  losartan (COZAAR) 25 MG tablet TAKE 1 TABLET(25 MG) BY MOUTH DAILY 08/17/17   Copland, Gay Filler, MD  metFORMIN (GLUCOPHAGE) 1000 MG tablet TAKE 1 TABLET BY MOUTH TWICE DAILY WITH A MEAL. 08/17/17   Copland, Gay Filler, MD  omeprazole (PRILOSEC) 20 MG capsule Take 1 capsule (20 mg total) by mouth daily. 08/17/17   Copland, Gay Filler, MD  propranolol (INDERAL) 10 MG tablet TAKE 1 TABLET BY MOUTH EVERY 8 TO 12 HOURS AS NEEDED FOR TREMOR 08/17/17   Copland, Gay Filler, MD  traZODone (DESYREL)  150 MG tablet Take 1 tablet (150 mg total) by mouth at bedtime. 08/17/17   Copland, Gay Filler, MD    Inpatient Medications: Scheduled Meds:  Continuous Infusions:  PRN Meds: nitroGLYCERIN  Allergies:    Allergies  Allergen Reactions  . Pneumovax 23 [Pneumococcal Vac Polyvalent]     Pt had severe arm pain  . Amaryl [Glimepiride] Nausea Only  . Cephalexin Hives  . Sulfa Antibiotics Other (See Comments)    Unknown by pt    Social History:   Social History   Social History  . Marital status: Married    Spouse name: N/A  . Number of children: N/A  . Years of education: N/A   Occupational History  . Not on file.   Social History Main Topics  . Smoking status: Never Smoker  . Smokeless tobacco: Never Used  . Alcohol use 0.6 oz/week    1 Glasses of wine per week  . Drug use: No  . Sexual activity: Yes    Birth control/ protection: None   Other Topics Concern  . Not on file   Social History Narrative   Marital status: married x 1970.      Children:  2 children; 7 grandchildren.      Employment:  [employer].      Tobacco: none       Alcohol: sporadic       Exercises: walking 3 days per week.    Family History:    Family History  Problem Relation Age of Onset  . Diabetes Mother   . Hyperlipidemia Mother   . Diabetes Father   . Heart disease Father 88       AMI x 2; first AMI age 48  . Stroke Father   . Heart disease Brother        defibrillator; CHF  . Hyperlipidemia Brother      ROS:  Please see the history of present illness.  ROS  All other ROS reviewed and negative.     Physical Exam/Data:   Vitals:   08/17/17 1410 08/17/17 1515 08/17/17 1522 08/17/17 1525  BP: (!) 151/97 (!) 141/71 125/76 125/76  Pulse: 72 77    Resp:  18    Temp:  98 F (36.7 C)    TempSrc:  Oral    SpO2: 97% 100%     No intake or output data in the 24 hours ending 08/17/17 1607 There were no vitals filed for this visit. There is no height or weight  on file to calculate BMI.  General:  Well nourished, well developed, in no acute distress HEENT: normal Neck: no JVD Vascular: No carotid bruits Cardiac:  normal S1, S2; RRR; no murmur Lungs:  clear to auscultation bilaterally, no wheezing, rhonchi or rales  Abd: soft, nontender, no hepatomegaly  Ext: no edema Musculoskeletal:  No deformities, BUE and BLE  strength normal and equal Skin: warm and dry  Neuro:  CNs 2-12 intact, no focal abnormalities noted Psych:  Normal affect   EKG:  The EKG was personally reviewed and demonstrates:  sinus Telemetry:  Telemetry was personally reviewed and demonstrates:  sinus  Relevant CV Studies:  Echocardiogram pending  ETT: 11/08/12: FINAL IMPRESSION: Normal exercise stress test. No significant EKG changes concerning for ischemia. Average exercise tolerance.  Laboratory Data:  Chemistry Recent Labs Lab 08/17/17 1125  NA 135  K 4.4  CL 100*  CO2 27  GLUCOSE 92  BUN 24*  CREATININE 1.18*  CALCIUM 9.0  GFRNONAA 47*  GFRAA 54*  ANIONGAP 8     Recent Labs Lab 08/17/17 1125  PROT 7.1  ALBUMIN 4.0  AST 17  ALT 11*  ALKPHOS 56  BILITOT 0.6   Hematology Recent Labs Lab 08/17/17 1125  WBC 5.8  RBC 3.84*  HGB 10.8*  HCT 33.3*  MCV 86.7  MCH 28.1  MCHC 32.4  RDW 12.7  PLT 165   Cardiac Enzymes Recent Labs Lab 08/17/17 1125  TROPONINI <0.03   No results for input(s): TROPIPOC in the last 168 hours.  BNPNo results for input(s): BNP, PROBNP in the last 168 hours.  DDimer No results for input(s): DDIMER in the last 168 hours.  Radiology/Studies:  Dg Chest 2 View  Result Date: 08/17/2017 CLINICAL DATA:  Chest heaviness. EXAM: CHEST  2 VIEW COMPARISON:  Chest x-ray 11/16/2012. FINDINGS: Mediastinum and hilar structures normal. Lungs are clear. No pleural effusion or pneumothorax. Heart size normal. No acute bony abnormality. IMPRESSION: No acute cardiopulmonary disease. Electronically Signed   By: Marcello Moores  Register   On:  08/17/2017 11:52    Assessment and Plan:   1. Chest pain - troponin x 1 negative - EKG without acute signs of ischemia Chest pain has typical and atypical features. She has risk factors for ACS, including HTN, HLD, and DM. Continue to trend troponin overnight.  No heparin drip needed unless troponin becomes positive. Discussed with patient the utility of coronary CT vs myoview stress test tomorrow. Will plan to keep NPO at [place] tonight.  2. HTN - continue losartan  3. HLD - continue lipitor - repeat lipid panel in AM  4. DM - A1c 5.8% 02/27/17 - restart metformin at discharge   For questions or updates, please contact [place] Please consult [email or web address] for contact info under Cardiology/STEMI.   Signed, Tami Lin [place], [place]  08/17/2017 4:07 PM   The patient was seen, examined and discussed with Minette Brine , PA-C and I agree with the above.   66 y.o. female with a hx of DM, HTN, HLD, GERD, depression and anxiety who is being seen today for the evaluation of chest pain at the request of Dr. Loleta Books. Negative stress test in 2013. She developed pressure like retrosternal chest pain radiating to her back and neck that resolved later in the day, and reappeared today. Troponin negative x 2, ECG normal. Currently chest pain free, her father had MI in late 56', he was a smoker, brother had SCD and has an ICD, she doesn't know if he has CAD. Risk factors include HTN and DM. We will plan for a coronary CTA to further evaluate. I will increase propranolol to 20 mg po BID. Continue aspirin and atorvastatin.  Ena Dawley, MD 08/17/2017

## 2017-08-17 NOTE — ED Notes (Signed)
Pt c/o HA.

## 2017-08-17 NOTE — Patient Instructions (Signed)
I will do your refills for you!  Please proceed to the ER for further evaluation of your chest discomfort. I also ordered an iron level that can be done when you have blood drawn downstairs today- please ask the person who draws your blood to send this test as well

## 2017-08-17 NOTE — Progress Notes (Signed)
[place] at [facility] [address], [place], [place] [address] [phone]  Date:  08/17/2017   Name:  Abigail [name]   DOB:  02-04-1951   MRN:  [id number]  PCP:  Darreld Mclean, MD    Chief Complaint: Medication Management; Immunizations; and Chest Pain (Pt states that her back and chest started hurting yesterday morning when she woke up. Has since "eased off" but still has discomfort.)   History of Present Illness:  Abigail [name] is a 66 y.o. very pleasant female patient who presents with the following:  I last saw her in February of this year  History of DM, anxiety, anemia She does see Dr. Dwyane Dee for her DM- last A1c looked great She had come in for a med refill and iron check However yesterday morning she notes onset of chest pressure/ pain and still today her chest feels heavy.   She describes a heavy feeling.  Seemed to ease last night per her husband but got worse again this morning She does not feel SOB in particular  She has not been sick recently She did her job as a Building surveyor- being active did not seem to make her feel worse , did not worsen chest pain  She did have a negative ETT about 4 years ago when she was having some atypical CP Otherwise no more recent chest pain until now She does not have any dx of cardiac disease  She is on lipitor, victoza, losartan, metformin, trazodone/ prozac, and inderal prn Not currently taking iron - was not sure if she was supposed to continue this. Will order a ferriting for her today \  Her mother has a fib and her dad had MI x2  She increased her prozac to 40 mg just over the last week or so She is also on inderal for anxiety which she takes as needed  BP Readings from Last 3 Encounters:  08/17/17 126/70  03/03/17 120/70  01/02/17 (!) 164/82     Lab Results  Component Value Date   HGBA1C 5.8 02/27/2017     Patient Active Problem List    Diagnosis Date Noted  . Microscopic hematuria 09/16/2015  . Trimalleolar fracture of ankle, closed 11/17/2014  . Other and unspecified hyperlipidemia 07/24/2013  . Chest pain 10/23/2012  . Type II or unspecified type diabetes mellitus without mention of complication, uncontrolled 10/08/2012  . Anxiety 10/08/2012    Past Medical History:  Diagnosis Date  . Anxiety   . Depression   . Diabetes mellitus    Type 2  . GERD (gastroesophageal reflux disease)   . Hematuria   . Hyperlipidemia   . Hypertension     Past Surgical History:  Procedure Laterality Date  . CHOLECYSTECTOMY N/A 06/03/2015   Procedure: LAPAROSCOPIC CHOLECYSTECTOMY WITH INTRAOPERATIVE CHOLANGIOGRAM;  Surgeon: Donnie Mesa, MD;  Location: [place];  Service: General;  Laterality: N/A;  . COLONOSCOPY    . FRACTURE SURGERY    . HAMMER TOE SURGERY    . ORIF ANKLE FRACTURE Left 11/17/2014   Procedure: OPEN REDUCTION INTERNAL FIXATION (ORIF) LEFT TRIMALLEOLAR ANKLE FRACTURE;  Surgeon: Marianna Payment, MD;  Location: [facility];  Service: Orthopedics;  Laterality: Left;  . OVARIAN CYST REMOVAL      Social History  Substance Use Topics  . Smoking status: Never Smoker  . Smokeless tobacco: Never Used  . Alcohol use 0.6 oz/week    1 Glasses of wine  per week    Family History  Problem Relation Age of Onset  . Diabetes Mother   . Hyperlipidemia Mother   . Diabetes Father   . Heart disease Father 25       AMI x 2; first AMI age 34  . Stroke Father   . Heart disease Brother        defibrillator; CHF  . Hyperlipidemia Brother     Allergies  Allergen Reactions  . Pneumovax 23 [Pneumococcal Vac Polyvalent]     Pt had severe arm pain  . Amaryl [Glimepiride] Nausea Only  . Cephalexin Hives  . Sulfa Antibiotics Other (See Comments)    Unknown by pt    Medication list has been reviewed and updated.  Current Outpatient Prescriptions on File Prior to Visit  Medication Sig Dispense Refill  . atorvastatin (LIPITOR) 20  MG tablet Take 1 tablet (20 mg total) by mouth daily. 90 tablet 3  . ferrous sulfate 325 (65 FE) MG tablet Take 1 tablet (325 mg total) by mouth 2 (two) times daily with a meal. 60 tablet 3  . FLUoxetine (PROZAC) 20 MG capsule TAKE 1 CAPSULE(20 MG) BY MOUTH DAILY 90 capsule 0  . FLUoxetine (PROZAC) 20 MG capsule Take 1 capsule (20 mg total) by mouth daily. 90 capsule 3  . glucose blood (ONETOUCH VERIO) test strip TEST BLOOD SUGARS TWICE DAILY. 200 each 2  . liraglutide (VICTOZA) 18 MG/3ML SOPN INJECT 1.2 MG INTO THE SKIN DAILY 18 mL 1  . losartan (COZAAR) 25 MG tablet TAKE 1 TABLET(25 MG) BY MOUTH DAILY 90 tablet 1  . metFORMIN (GLUCOPHAGE) 1000 MG tablet TAKE 1 TABLET BY MOUTH TWICE DAILY WITH A MEAL. 180 tablet 3  . omeprazole (PRILOSEC) 20 MG capsule Take 1 capsule (20 mg total) by mouth daily. 90 capsule 0  . propranolol (INDERAL) 10 MG tablet TAKE 1 TABLET BY MOUTH EVERY 8 TO 12 HOURS AS NEEDED FOR TREMOR 60 tablet 0  . traZODone (DESYREL) 150 MG tablet Take 1 tablet (150 mg total) by mouth at bedtime. 90 tablet 0  . aspirin EC 325 MG tablet Take 1 tablet (325 mg total) by mouth 2 (two) times daily. (Patient not taking: Reported on 03/03/2017) 84 tablet 0  . ferrous sulfate 325 (65 FE) MG tablet TAKE 1 TABLET(325 MG) BY MOUTH TWICE DAILY WITH A MEAL (Patient not taking: Reported on 03/03/2017) 60 tablet 2   No current facility-administered medications on file prior to visit.     Review of Systems:  As per HPI- otherwise negative. She has not noted any sweating or nausea in partciular associated with her CP No fever or cough No other current sx of illnes  Physical Examination: Vitals:   08/17/17 1018  BP: 126/70  Pulse: 90  Resp: 18  Temp: 98.6 F (37 C)  SpO2: 96%   Vitals:   08/17/17 1018  Weight: 135 lb (61.2 kg)  Height: 5\' 5"  (1.651 m)   Body mass index is 22.47 kg/m. Ideal Body Weight: Weight in (lb) to have BMI = 25: 149.9  GEN: WDWN, NAD, Non-toxic, A & O x  3 HEENT: Atraumatic, Normocephalic. Neck supple. No masses, No LAD. Ears and Nose: No external deformity. CV: RRR, No M/G/R. No JVD. No thrill. No extra heart sounds. PULM: CTA B, no wheezes, crackles, rhonchi. No retractions. No resp. distress. No accessory muscle use. ABD: S, NT, ND, +BS. No rebound. No HSM. EXTR: No c/c/e NEURO Normal gait.  PSYCH: Normally interactive.  Conversant. Not depressed or anxious appearing.  Calm demeanor.  Normal weight, looks well I am somewhat able to reproduce her CP by pressing on her chest wall However she notes that the pain also runs through to her upper back which is worrisome to her  EKG: SR with T wave change in V1 compared with previous  Compared to tracing from 1/16 subtle change   Assessment and Plan: Chest pain, unspecified type - Plan: EKG 12-Lead  Need for immunization against influenza - Plan: Flu vaccine HIGH DOSE PF (Fluzone High dose)  Iron deficiency anemia, unspecified iron deficiency anemia type - Plan: Ferritin  Dyslipidemia - Plan: atorvastatin (LIPITOR) 20 MG tablet  Benign essential HTN - Plan: losartan (COZAAR) 25 MG tablet  Essential tremor - Plan: propranolol (INDERAL) 10 MG tablet  Reactive depression - Plan: FLUoxetine (PROZAC) 20 MG capsule, traZODone (DESYREL) 150 MG tablet  Diabetes mellitus type 2 in nonobese ([place]) - Plan: metFORMIN (GLUCOPHAGE) 1000 MG tablet  Microcytic anemia  Gastroesophageal reflux disease, esophagitis presence not specified - Plan: omeprazole (PRILOSEC) 20 MG capsule  Had scheduled a follow-up visit today for med refills. However she is having CP since yesterday am- has waxed and waned but is still persistent.  Radiates into her back. Referral to the ER downstairs for evaluation- appreciate their care of this nice lady Flu shot given meds refilled Ordered ferritin for her today   Signed Lamar Blinks, MD

## 2017-08-17 NOTE — ED Notes (Signed)
Report to [facility], RN on 6E

## 2017-08-17 NOTE — H&P (Signed)
Abigail [name] AYT:[id number] DOB: 1951/07/02 DOA: 08/17/2017  Referring physician: PCP PCP: Darreld Mclean, MD  Specialists: cardiology  Chief Complaint: cop  HPI: Abigail [name] is a 66 y.o. 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 but did not have a particularly stressful morning States that it felt like a heaviness with radiation up into between the clavicles--no arm radiation and no relieving or attenuating factor, no positional changes made it better nor worse Has not had pain like this before Had recent trip to [place] where she took multiple short stops and the trip was less than 4 hours No lower extremity swelling, felt like it may have been reflux like pain-drank Coke and Pepsi without any resolution of bloated and heaviness feeling in the chest  Did go to see PCP 10/11 after discussing with coworkers on 10/10 -heart score of 5 referred to the emergency room and given aspirin 4 as well as nitroglycerin which completely resolved the pain  EKG with my over read shows sinus rhythm no precordial lead changes nor reciprocal lead changes suggestive of acute injury pattern, PR interval 0.08, QRS axis is about 40 Point-of-care troponin 0.03 chest x-rayshowed no acute cardiopulmonary abnormalities Hemoglobin 10.8  On transport from that center [facility] had pressure again lasting about 10 minutes 4/10 Intensity Ctr. Of chest with radiation once again to between the clavicles and in the throat--relieved again by nitroglycerin  Review of Systems:   Past Medical History:  Diagnosis Date  . Anxiety   . Depression   . Diabetes mellitus    Type 2  . GERD  (gastroesophageal reflux disease)   . Hematuria   . Hyperlipidemia   . Hypertension    Past Surgical History:  Procedure Laterality Date  . CHOLECYSTECTOMY N/A 06/03/2015   Procedure: LAPAROSCOPIC CHOLECYSTECTOMY WITH INTRAOPERATIVE CHOLANGIOGRAM;  Surgeon: Donnie Mesa, MD;  Location: [place];  Service: General;  Laterality: N/A;  . COLONOSCOPY    . FRACTURE SURGERY    . HAMMER TOE SURGERY    . ORIF ANKLE FRACTURE Left 11/17/2014   Procedure: OPEN REDUCTION INTERNAL FIXATION (ORIF) LEFT TRIMALLEOLAR ANKLE FRACTURE;  Surgeon: Marianna Payment, MD;  Location: [place];  Service: Orthopedics;  Laterality: Left;  . OVARIAN CYST REMOVAL     Social History:  reports that she has never smoked. She has never used smokeless tobacco. She reports that she drinks about 0.6 oz of alcohol per week . She reports that she does not use drugs.  Allergies  Allergen Reactions  . Pneumovax 23 [Pneumococcal Vac Polyvalent]     Pt had severe arm pain  . Amaryl [Glimepiride] Nausea Only  . Cephalexin Hives  . Sulfa Antibiotics Other (See Comments)    Unknown by pt    Family History  Problem Relation Age of Onset  . Diabetes Mother   . Hyperlipidemia Mother   . Diabetes Father   . Heart disease Father 55       AMI x 2; first AMI age 93  . Stroke Father   . Heart disease Brother        defibrillator; CHF  . Hyperlipidemia Brother     Prior to Admission medications   Medication Sig Start Date  End Date Taking? Authorizing Provider  aspirin EC 325 MG tablet Take 1 tablet (325 mg total) by mouth 2 (two) times daily. Patient not taking: Reported on 03/03/2017 11/17/14   Leandrew Koyanagi, MD  atorvastatin (LIPITOR) 20 MG tablet Take 1 tablet (20 mg total) by mouth daily. 08/17/17   Copland, Gay Filler, MD  ferrous sulfate 325 (65 FE) MG tablet Take 1 tablet (325 mg total) by mouth 2 (two) times daily with a meal. 02/11/17   Copland, Gay Filler, MD  ferrous sulfate 325 (65 FE) MG tablet TAKE 1 TABLET(325 MG) BY  MOUTH TWICE DAILY WITH A MEAL Patient not taking: Reported on 03/03/2017 02/15/17   Copland, Gay Filler, MD  FLUoxetine (PROZAC) 20 MG capsule Take 20 or 40 mg daily for depression 08/17/17   Copland, Gay Filler, MD  glucose blood (ONETOUCH VERIO) test strip TEST BLOOD SUGARS TWICE DAILY. 07/06/16   Elayne Snare, MD  liraglutide (VICTOZA) 18 MG/3ML SOPN INJECT 1.2 MG INTO THE SKIN DAILY 12/07/16   Elayne Snare, MD  losartan (COZAAR) 25 MG tablet TAKE 1 TABLET(25 MG) BY MOUTH DAILY 08/17/17   Copland, Gay Filler, MD  metFORMIN (GLUCOPHAGE) 1000 MG tablet TAKE 1 TABLET BY MOUTH TWICE DAILY WITH A MEAL. 08/17/17   Copland, Gay Filler, MD  omeprazole (PRILOSEC) 20 MG capsule Take 1 capsule (20 mg total) by mouth daily. 08/17/17   Copland, Gay Filler, MD  propranolol (INDERAL) 10 MG tablet TAKE 1 TABLET BY MOUTH EVERY 8 TO 12 HOURS AS NEEDED FOR TREMOR 08/17/17   Copland, Gay Filler, MD  traZODone (DESYREL) 150 MG tablet Take 1 tablet (150 mg total) by mouth at bedtime. 08/17/17   Copland, Gay Filler, MD   Physical Exam: Vitals:   08/17/17 1515 08/17/17 1522  BP: (!) 141/71 125/76  Pulse: 77   Resp: 18   Temp: 98 F (36.7 C)   SpO2: 100%      General:  EOMI NCAT looking much younger than stated age  No JVD no bruit  S1-S2 no murmur  Telemetry so far looks benign  Chest clinically clear  Abdomen soft, no epigastric tenderness  musculoskeletalskeletal able to move both shoulders in all ranges of motion without impingement or pain  No chest wall pressure-pain relation  Lower extremities are soft nontender without swelling  Neurologically intact otherwise   Labs on Admission:  Basic Metabolic Panel:  Recent Labs Lab 08/17/17 1125  NA 135  K 4.4  CL 100*  CO2 27  GLUCOSE 92  BUN 24*  CREATININE 1.18*  CALCIUM 9.0   Liver Function Tests:  Recent Labs Lab 08/17/17 1125  AST 17  ALT 11*  ALKPHOS 56  BILITOT 0.6  PROT 7.1  ALBUMIN 4.0   No results for input(s): LIPASE,  AMYLASE in the last 168 hours. No results for input(s): AMMONIA in the last 168 hours. CBC:  Recent Labs Lab 08/17/17 1125  WBC 5.8  HGB 10.8*  HCT 33.3*  MCV 86.7  PLT 165   Cardiac Enzymes:  Recent Labs Lab 08/17/17 1125  TROPONINI <0.03    BNP (last 3 results) No results for input(s): BNP in the last 8760 hours.  ProBNP (last 3 results) No results for input(s): PROBNP in the last 8760 hours.  CBG: No results for input(s): GLUCAP in the last 168 hours.  Radiological Exams on Admission: Dg Chest 2 View  Result Date: 08/17/2017 CLINICAL DATA:  Chest heaviness. EXAM: CHEST  2 VIEW COMPARISON:  Chest x-ray  11/16/2012. FINDINGS: Mediastinum and hilar structures normal. Lungs are clear. No pleural effusion or pneumothorax. Heart size normal. No acute bony abnormality. IMPRESSION: No acute cardiopulmonary disease. Electronically Signed   By: Marcello Moores  Register   On: 08/17/2017 11:52    EKG: Independently reviewed. See above discussion  Assessment/Plan Active Problems:   Chest pain   1. ROMI--possible reflux unlikely PE-point of care troponin negative, EKG reassuring-creatinine is slightly elevated at 24/1.18 so I will hydrate her gently with 50 cc of saline and ask cardiology to see the patient in consult-nature of pain--I will start the patient on Nitropaste quarter inch and give Tylenol for headache-she has not had an ischemic eval in a while and requests if possible Dr. Burt Knack to see the patient as that is her husband's cardiologist-I will consult [facility] for further workup--we will obtain echocardiogram--I note that she is on omeprazole once daily but also on twice a day aspirin 013 mg which can certainly cause reflux syms--for now I would change her to 81 mg of aspirin--already on propanolol 10 twice a day--might increase or change as per cardiologist's instruction more beta selective agent if felt necessary 2. Diabetes mellitus type 2 with  neuropathy/nephropathy-discontinuing the toes of 1.2 daily as well as metformin1000 daily=--if blood sugars are above 180 Will Pl. On sliding scale coverage 3. Acute kidney injury-mild, continue saline 75 cc per hour for now 4. Essential tremor-continue propanolol as above 5. Hypertension, nephropathy prevention--holding losartan 25 given mild azotemia as above 6. Bipolar-continue Prozac 40 daily (recent increase-as had issues with mom having orthopedic surgery and being placed in a skilled facility which may account for some anxiety component to her chest pain?), Continue trazodone 150 at bedtime  Keep on telemetry No family present Cardiology consulted Observation for now  Time spent: 55 minutes  Verlon Au [facility] Pager [phone]  If 7PM-7AM, please contact night-coverage [email or web address] Password [email or web address] 08/17/2017, 3:28 PM

## 2017-08-18 ENCOUNTER — Observation Stay (HOSPITAL_COMMUNITY): Payer: Medicare Other

## 2017-08-18 ENCOUNTER — Encounter (HOSPITAL_COMMUNITY): Payer: Self-pay | Admitting: *Deleted

## 2017-08-18 ENCOUNTER — Observation Stay (HOSPITAL_BASED_OUTPATIENT_CLINIC_OR_DEPARTMENT_OTHER): Payer: Medicare Other

## 2017-08-18 DIAGNOSIS — I340 Nonrheumatic mitral (valve) insufficiency: Secondary | ICD-10-CM | POA: Diagnosis not present

## 2017-08-18 DIAGNOSIS — Z992 Dependence on renal dialysis: Secondary | ICD-10-CM

## 2017-08-18 DIAGNOSIS — K219 Gastro-esophageal reflux disease without esophagitis: Secondary | ICD-10-CM | POA: Diagnosis not present

## 2017-08-18 DIAGNOSIS — R072 Precordial pain: Secondary | ICD-10-CM | POA: Diagnosis not present

## 2017-08-18 DIAGNOSIS — E1122 Type 2 diabetes mellitus with diabetic chronic kidney disease: Secondary | ICD-10-CM | POA: Diagnosis not present

## 2017-08-18 DIAGNOSIS — I119 Hypertensive heart disease without heart failure: Secondary | ICD-10-CM | POA: Diagnosis not present

## 2017-08-18 DIAGNOSIS — N186 End stage renal disease: Secondary | ICD-10-CM | POA: Diagnosis not present

## 2017-08-18 DIAGNOSIS — E782 Mixed hyperlipidemia: Secondary | ICD-10-CM | POA: Diagnosis not present

## 2017-08-18 DIAGNOSIS — E119 Type 2 diabetes mellitus without complications: Secondary | ICD-10-CM | POA: Diagnosis not present

## 2017-08-18 DIAGNOSIS — E1159 Type 2 diabetes mellitus with other circulatory complications: Secondary | ICD-10-CM | POA: Diagnosis not present

## 2017-08-18 DIAGNOSIS — R51 Headache: Secondary | ICD-10-CM | POA: Diagnosis not present

## 2017-08-18 DIAGNOSIS — Z794 Long term (current) use of insulin: Secondary | ICD-10-CM

## 2017-08-18 DIAGNOSIS — E1169 Type 2 diabetes mellitus with other specified complication: Secondary | ICD-10-CM

## 2017-08-18 DIAGNOSIS — I10 Essential (primary) hypertension: Secondary | ICD-10-CM | POA: Insufficient documentation

## 2017-08-18 DIAGNOSIS — Z7984 Long term (current) use of oral hypoglycemic drugs: Secondary | ICD-10-CM | POA: Diagnosis not present

## 2017-08-18 DIAGNOSIS — R079 Chest pain, unspecified: Secondary | ICD-10-CM | POA: Diagnosis not present

## 2017-08-18 DIAGNOSIS — M549 Dorsalgia, unspecified: Secondary | ICD-10-CM | POA: Diagnosis not present

## 2017-08-18 DIAGNOSIS — M542 Cervicalgia: Secondary | ICD-10-CM | POA: Diagnosis not present

## 2017-08-18 DIAGNOSIS — I200 Unstable angina: Secondary | ICD-10-CM | POA: Diagnosis not present

## 2017-08-18 LAB — GLUCOSE, CAPILLARY
GLUCOSE-CAPILLARY: 97 mg/dL (ref 65–99)
GLUCOSE-CAPILLARY: 217 mg/dL — AB (ref 65–99)
GLUCOSE-CAPILLARY: 133 mg/dL — AB (ref 65–99)
Glucose-Capillary: 59 mg/dL — ABNORMAL LOW (ref 65–99)

## 2017-08-18 LAB — ECHOCARDIOGRAM COMPLETE
HEIGHTINCHES: 65 in
Weight: 2155.2 oz

## 2017-08-18 LAB — LIPID PANEL
Cholesterol: 139 mg/dL (ref 0–200)
HDL: 44 mg/dL (ref >40)
LDL CALC: 63 mg/dL (ref 0–99)
TRIGLYCERIDES: 161 mg/dL — AB (ref <150)
Total CHOL/HDL Ratio: 3.2 RATIO
VLDL: 32 mg/dL (ref 0–40)

## 2017-08-18 MED ORDER — NITROGLYCERIN 0.4 MG SL SUBL
SUBLINGUAL_TABLET | SUBLINGUAL | Status: AC
  Administered 2017-08-18: 0.8 mg via SUBLINGUAL
  Filled 2017-08-18: qty 2

## 2017-08-18 MED ORDER — METOPROLOL TARTRATE 5 MG/5ML IV SOLN
5.0000 mg | INTRAVENOUS | Status: AC | PRN
  Administered 2017-08-18 (×4): 5 mg via INTRAVENOUS

## 2017-08-18 MED ORDER — NITROGLYCERIN 0.4 MG SL SUBL
SUBLINGUAL_TABLET | SUBLINGUAL | Status: AC
  Filled 2017-08-18: qty 1

## 2017-08-18 MED ORDER — METOPROLOL TARTRATE 5 MG/5ML IV SOLN
INTRAVENOUS | Status: AC
  Administered 2017-08-18: 5 mg via INTRAVENOUS
  Filled 2017-08-18: qty 20

## 2017-08-18 MED ORDER — NITROGLYCERIN 0.4 MG SL SUBL
0.4000 mg | SUBLINGUAL_TABLET | Freq: Once | SUBLINGUAL | Status: AC
  Administered 2017-08-18: 0.4 mg via SUBLINGUAL

## 2017-08-18 MED ORDER — LOSARTAN POTASSIUM 50 MG PO TABS
50.0000 mg | ORAL_TABLET | Freq: Every day | ORAL | Status: DC
  Administered 2017-08-18: 50 mg via ORAL
  Filled 2017-08-18: qty 1

## 2017-08-18 MED ORDER — LOSARTAN POTASSIUM 50 MG PO TABS: 50.0000 mg | ORAL_TABLET | Freq: Every day | ORAL | 0 refills | Status: DC

## 2017-08-18 MED ORDER — NITROGLYCERIN 0.4 MG SL SUBL
0.8000 mg | SUBLINGUAL_TABLET | Freq: Once | SUBLINGUAL | Status: AC
  Administered 2017-08-18: 0.8 mg via SUBLINGUAL

## 2017-08-18 MED ORDER — NITROGLYCERIN 0.4 MG SL SUBL: 0.4000 mg | SUBLINGUAL_TABLET | SUBLINGUAL | 0 refills | Status: DC | PRN

## 2017-08-18 MED ORDER — IOPAMIDOL (ISOVUE-370) INJECTION 76%
INTRAVENOUS | Status: AC
  Administered 2017-08-18: 80 mL
  Filled 2017-08-18: qty 100

## 2017-08-18 MED ORDER — ASPIRIN 81 MG PO TBEC: 81.0000 mg | DELAYED_RELEASE_TABLET | Freq: Every day | ORAL | Status: DC

## 2017-08-18 NOTE — Progress Notes (Signed)
Patient ID: Abigail [name], female   DOB: 08-18-1951, 66 y.o.   MRN: [id number]   The patient had 50 cc Isovue 370 and 40 cc saline extravasate into the right arm during attempted cardiac CTA.  On physical exam, there was mild soft tissue swelling in the mid and upper arm with coolness to the touch.  There was also mild bruising in the antecubital fossa at the IV insertion site.  Compressing the surrounding area produced clear fluid from the IV insertion site.  The distal radial and ulnar artery pulses were strong and normal.  No skin discoloration. The patient was not experiencing any pain.  Post extravasation orders written, pending RN release.

## 2017-08-18 NOTE — Progress Notes (Signed)
  Echocardiogram 2D Echocardiogram has been performed.  Darlina Sicilian M 08/18/2017, 12:56 PM

## 2017-08-18 NOTE — Discharge Instructions (Signed)
Chest Wall Pain Chest wall pain is pain in or around the bones and muscles of your chest. Sometimes, an injury causes this pain. Sometimes, the cause may not be known. This pain may take several weeks or longer to get better. Follow these instructions at home: Pay attention to any changes in your symptoms. Take these actions to help with your pain:  Rest as told by your doctor.  Avoid activities that cause pain. Try not to use your chest, belly (abdominal), or side muscles to lift heavy things.  If directed, apply ice to the painful area: ? Put ice in a plastic bag. ? Place a towel between your skin and the bag. ? Leave the ice on for 20 minutes, 2-3 times per day.  Take over-the-counter and prescription medicines only as told by your doctor.  Do not use tobacco products, including cigarettes, chewing tobacco, and e-cigarettes. If you need help quitting, ask your doctor.  Keep all follow-up visits as told by your doctor. This is important.  Contact a doctor if:  You have a fever.  Your chest pain gets worse.  You have new symptoms. Get help right away if:  You feel sick to your stomach (nauseous) or you throw up (vomit).  You feel sweaty or light-headed.  You have a cough with phlegm (sputum) or you cough up blood.  You are short of breath. This information is not intended to replace advice given to you by your health care provider. Make sure you discuss any questions you have with your health care provider. Document Released: 04/11/2008 Document Revised: 03/31/2016 Document Reviewed: 01/19/2015 Elsevier Interactive Patient Education  2018 [employer].   Aspirin and Your Heart Aspirin is a medicine that affects the way blood clots. Aspirin can be used to help reduce the risk of blood clots, heart attacks, and other heart-related problems. Should I take aspirin? Your health care provider will help you determine whether it is safe and beneficial for you to take aspirin  daily. Taking aspirin daily may be beneficial if you:  Have had a heart attack or chest pain.  Have undergone open heart surgery such as coronary artery bypass surgery (CABG).  Have had coronary angioplasty.  Have experienced a stroke or transient ischemic attack (TIA).  Have peripheral vascular disease (PVD).  Have chronic heart rhythm problems such as atrial fibrillation.  Are there any risks of taking aspirin daily? Daily use of aspirin can increase your risk of side effects. Some of these include:  Bleeding. Bleeding problems can be minor or serious. An example of a minor problem is a cut that does not stop bleeding. An example of a more serious problem is stomach bleeding or bleeding into the brain. Your risk of bleeding is increased if you are also taking non-steroidal anti-inflammatory medicine (NSAIDs).  Increased bruising.  Upset stomach.  An allergic reaction. People who have nasal polyps have an increased risk of developing an aspirin allergy.  What are some guidelines I should follow when taking aspirin?  Take aspirin only as directed by your health care provider. Make sure you understand how much you should take and what form you should take. The two forms of aspirin are: ? Non-enteric-coated. This type of aspirin does not have a coating and is absorbed quickly. Non-enteric-coated aspirin is usually recommended for people with chest pain. This type of aspirin also comes in a chewable form. ? Enteric-coated. This type of aspirin has a special coating that releases the medicine very slowly. Enteric-coated  aspirin causes less stomach upset than non-enteric-coated aspirin. This type of aspirin should not be chewed or crushed.  Drink alcohol in moderation. Drinking alcohol increases your risk of bleeding. When should I seek medical care?  You have unusual bleeding or bruising.  You have stomach pain.  You have an allergic reaction. Symptoms of an allergic reaction  include: ? Hives. ? Itchy skin. ? Swelling of the lips, tongue, or face.  You have ringing in your ears. When should I seek immediate medical care?  Your bowel movements are bloody, dark red, or black in color.  You vomit or cough up blood.  You have blood in your urine.  You cough, wheeze, or feel short of breath. If you have any of the following symptoms, this is an emergency. Do not wait to see if the pain will go away. Get medical help at once. Call your local emergency services (911 in the [place] Do not drive yourself to the hospital.  You have severe chest pain, especially if the pain is crushing or pressure-like and spreads to the arms, back, neck, or jaw.  You have stroke-like symptoms, such as: ? Loss of vision. ? Difficulty talking. ? Numbness or weakness on one side of your body. ? Numbness or weakness in your arm or leg. ? Not thinking clearly or feeling confused.  This information is not intended to replace advice given to you by your health care provider. Make sure you discuss any questions you have with your health care provider. Document Released: 10/06/2008 Document Revised: 03/02/2016 Document Reviewed: 01/29/2014 Elsevier Interactive Patient Education  2018 [employer].

## 2017-08-18 NOTE — Progress Notes (Signed)
Progress Note  Patient Name: Abigail [name] Date of Encounter: 08/18/2017  Primary Cardiologist: Dr. Johnsie Cancel (2013)  Subjective   No recurrent chest pain. Breathing at baseline. Undergoing Coronary CT this AM.   Inpatient Medications    Scheduled Meds: . aspirin EC  81 mg Oral BID  . atorvastatin  20 mg Oral Daily  . enoxaparin (LOVENOX) injection  40 mg Subcutaneous Q24H  . ferrous sulfate  325 mg Oral BID WC  . FLUoxetine  40 mg Oral Daily  . pantoprazole  40 mg Oral BID  . propranolol  10 mg Oral BID  . traZODone  150 mg Oral QHS   Continuous Infusions: . sodium chloride 75 mL/hr at 08/18/17 0603   PRN Meds: acetaminophen, gi cocktail, morphine injection, nitroGLYCERIN, ondansetron (ZOFRAN) IV   Vital Signs    Vitals:   08/18/17 0822 08/18/17 0855 08/18/17 0943 08/18/17 1033  BP: (!) 152/75 128/65 (!) 127/57 134/69  Pulse:   72 80  Resp:      Temp:   97.9 F (36.6 C)   TempSrc:   Oral   SpO2:   97%   Weight:      Height:        Intake/Output Summary (Last 24 hours) at 08/18/17 1107 Last data filed at 08/18/17 1033  Gross per 24 hour  Intake             1880 ml  Output                0 ml  Net             1880 ml   Filed Weights   08/17/17 1515 08/18/17 0413  Weight: 134 lb 14.7 oz (61.2 kg) 134 lb 11.2 oz (61.1 kg)    Telemetry    NSR, HR in 70's - 90's. No ectopic events.  - Personally Reviewed  ECG    NSR, HR 88 with no acute ST or T-wave changes.  - Personally Reviewed  Physical Exam   General: Well developed, well nourished, female appearing in no acute distress. Head: Normocephalic, atraumatic.  Neck: Supple without bruits, JVD not elevated. Lungs:  Resp regular and unlabored, CTA without wheezing or rales. Heart: RRR, S1, S2, no S3, S4, or murmur; no rub. Abdomen: Soft, non-tender, non-distended with normoactive bowel sounds. No hepatomegaly. No rebound/guarding. No obvious abdominal masses. Extremities: No clubbing, cyanosis,  or edema. Distal pedal pulses are 2+ bilaterally. Neuro: Alert and oriented X 3. Moves all extremities spontaneously. Psych: Normal affect.  Labs    Chemistry  Recent Labs Lab 08/17/17 1125 08/17/17 1857  NA 135  --   K 4.4  --   CL 100*  --   CO2 27  --   GLUCOSE 92  --   BUN 24*  --   CREATININE 1.18* 1.15*  CALCIUM 9.0  --   PROT 7.1  --   ALBUMIN 4.0  --   AST 17  --   ALT 11*  --   ALKPHOS 56  --   BILITOT 0.6  --   GFRNONAA 47* 48*  GFRAA 54* 56*  ANIONGAP 8  --      Hematology  Recent Labs Lab 08/17/17 1125 08/17/17 1857  WBC 5.8 6.9  RBC 3.84* 3.83*  HGB 10.8* 10.4*  HCT 33.3* 32.7*  MCV 86.7 85.4  MCH 28.1 27.2  MCHC 32.4 31.8  RDW 12.7 12.9  PLT 165 177    Cardiac Enzymes  Recent Labs Lab 08/17/17 1125 08/17/17 1857  TROPONINI <0.03 <0.03   No results for input(s): TROPIPOC in the last 168 hours.   BNPNo results for input(s): BNP, PROBNP in the last 168 hours.   DDimer No results for input(s): DDIMER in the last 168 hours.   Radiology    Dg Chest 2 View  Result Date: 08/17/2017 CLINICAL DATA:  Chest heaviness. EXAM: CHEST  2 VIEW COMPARISON:  Chest x-ray 11/16/2012. FINDINGS: Mediastinum and hilar structures normal. Lungs are clear. No pleural effusion or pneumothorax. Heart size normal. No acute bony abnormality. IMPRESSION: No acute cardiopulmonary disease. Electronically Signed   By: Marcello Moores  Register   On: 08/17/2017 11:52   Ct Cardiac Morph/pulm Vein W/cm&w/o Ca Score  Addendum Date: 08/18/2017   ADDENDUM REPORT: 08/18/2017 10:38 EXAM: OVER-READ INTERPRETATION  CT CHEST The following report is an over-read performed by radiologist Dr. Collene Leyden [facility] on 08/18/2017. This over-read does not include interpretation of cardiac or coronary anatomy or pathology. The coronary CTA interpretation by the cardiologist is attached. COMPARISON:  None. FINDINGS: Heart is normal size.  Visualized aorta is normal caliber. No  adenopathy in the lower mediastinum or hila. No confluent airspace opacities or effusions. Imaging into the upper abdomen shows no acute findings. Chest wall soft tissues are unremarkable. No acute bony abnormality. IMPRESSION: No acute or significant extracardiac abnormality. Electronically Signed   By: Rolm Baptise M.D.   On: 08/18/2017 10:38   Result Date: 08/18/2017 CLINICAL DATA:  36 -year-old female with diabetes, hypertension and chest pain. EXAM: Cardiac/Coronary  CT TECHNIQUE: The patient was scanned on a [employer]. FINDINGS: A 120 kV prospective scan was triggered in the descending thoracic aorta at 111 HU's. Axial non-contrast 3 mm slices were carried out through the heart. The data set was analyzed on a dedicated work station and scored using the [employer]. Gantry rotation speed was 250 msecs and collimation was .6 mm. 20 mg of iv Metoprolol and 1.2 mg of sl NTG was given. The 3D data set was reconstructed in 5% intervals of the 67-82 % of the R-R cycle. Diastolic phases were analyzed on a dedicated work station using MPR, MIP and VRT modes. The patient received 80 cc of contrast. Aorta: Normal size. Trivial calcifications in the aortic root. No dissection. Aortic Valve:  Trileaflet.  No calcifications. Coronary Arteries: Normal coronary origin. Right and left co-dominance. RCA is a large dominant artery that gives rise to PDA and two acute marginal branches. There is no plaque. Left main is a large artery that gives rise to LAD, ramus intermedius and LCX arteries. LAD is a large vessel that gives rise to two small diagonal branches and has no plaque. RI is a small artery that has no plaque. LCX is a non-dominant artery that gives rise to one small OM1 branch and PLVB. There is no plaque. Other findings: Normal pulmonary vein drainage into the left atrium. Normal let atrial appendage without a thrombus. Normal size of the pulmonary artery. IMPRESSION: 1. Coronary calcium score of 0.  This was 0 percentile for age and sex matched control. 2. Normal coronary origin with right dominance. 3. No evidence of CAD.  Consider non-cardiac sources of chest pain. Ena Dawley Electronically Signed: By: Ena Dawley On: 08/18/2017 09:54    Cardiac Studies   Echocardiogram: Pending  Patient Profile     66 y.o. female w/ PMH of Type 2 DM, HTN, HLD, and GERD who presented to [facility]  ED on 08/17/2017 for evaluation of chest pain.   Assessment & Plan    1. Chest Pain with Typical and Atypical Features - presented with episodes of retrosternal chest pain which was radiating to her back and neck. Denies any associated symptoms.  - cyclic troponin values have been negative. EKG shows no acute ischemic changes.  - she does have several cardiac risk factors including HTN, HLD, Type 2 DM, and family history of CAD.  - Coronary CT showed calcium score 0 and no CAD. - continue ASA, BB, and statin therapy.  - she can be discharged home, we will arrange for a follow up  2. HTN - BP variable at 125/63 -162/97 since admission.  - increase Losartan to 50 mg daily.   3. HLD - FLP shows total cholesterol of 139, HDL 44, and LDL 63. - continue Atorvastatin 20mg  daily.   4. Type 2 DM - A1c 5.8% on 02/27/2017 - restart metformin at time of discharge.   Romeo Rabon , PA-C 11:07 AM 08/18/2017 Pager: [phone]

## 2017-08-18 NOTE — Progress Notes (Signed)
Reassessed her right arm for infiltration.  Slightly decreased.  Reapplied a hot pack. Idolina Primer, RN

## 2017-08-18 NOTE — Progress Notes (Signed)
Pt was discharged to home.  Discharge instruction was given to pt and husband.  Prescriptions were sent to her Rx.  Idolina Primer, RN

## 2017-08-18 NOTE — Discharge Summary (Signed)
Discharge Summary  Abigail [name] EHU:[id number] DOB: 27-Mar-1951  PCP: Darreld Mclean, MD  Admit date: 08/17/2017 Discharge date: 08/18/2017  Time spent: 35 minutes  Recommendations for Outpatient Follow-up:  Follow up with cardiologist Dr. Meda Coffee on Sep 11 2017 at 8:30 am Follow up with PCP: Dr. Lorelei Pont within a week CT coronary done today 08/18/17: No sign of CAD Transthoracic echocardiogram done today 08/18/17: EF 55-60%, no motion wall abnormalities; mild mitral regurgitation.  Discharge Diagnoses:  Active Hospital Problems   Diagnosis Date Noted  . Chest pain 10/23/2012    Resolved Hospital Problems   Diagnosis Date Noted Date Resolved  No resolved problems to display.    Discharge Condition: Stable  Diet recommendation: Resume previous diet  Vitals:   08/18/17 1033 08/18/17 1300  BP: 134/69 (!) 147/70  Pulse: 80 85  Resp:    Temp:  97.9 F (36.6 C)  SpO2:  98%    History of present illness:  The patient is a 66 YO CF with past medical history significant for type 2 diabetes, HTN, HLD, anxiety, chronic depression, who presented on 09/17/17 with complaints of chest heaviness of 1 day duration radiating to her neck bilaterally. Troponin negative x2, EKG reviewed by self unremarkable for any ST-T changes. Cardiology consulted and followed the patient. Cozaar dose increased from 25 mg daily to 50 mg daily.  No events overnight. Patient given nitroglycerin SL as needed during her hospital stay. Patient states she feels better today. Denies chest pain, dyspnea, or palpitations. CT coronary done this morning unremarkable for any acute findings. Coronary calcium score of 0 with no evidence of CAD as reported by cardiology, Dr. Meda Coffee.  Patient was made aware of her results, labs, EKG, and CT coronary. All questions answered to her satisfaction. She will follow up with cardiology on Sep 11, 2017 at 0830 and with her PCP within a week for post hospitalization  visit.  Hospital Course:  Active Problems:   Chest pain Resolved; most likely non cardiac per cardiology. Troponin negative x2; EKG unremarkable, CT coronary unremarkable for CAD. Nitro SL given 0.4 mg prn. IV fluid NS  Acute hypoglycemia Most likely 2/2 to NPO in the setting of recent diabetic meds use On victoza and metformin at home; not given during hospital stay. BS 59 corrected to 217 after resuming po.  Anxiety/depression - stable - continue prozac, trazodone  CKD stage 3 - stable; no AKI. - GFR 47, cr 1.15; baseline cr 1.1 - Avoid nephrotoxic meds/agents - stop Naproxen  Chronic normocytic anemia - stable; no sign of active bleeding - baseline Hg 10.0 - continue ferrous sulfate  GERD - stable - continue PPIs, omeprazole  HTN - stable - continue cozaar, propranolol  HLD - stable - continue atorvastatin  Procedures: Transthoracic echocardiogram done today 08/18/17 EF 55-60%, no motion wall abnormalities, mild mitral regurgitation.  Consultations: Cardiology, Dr. Meda Coffee  Discharge Exam: BP (!) 147/70 (BP Location: Left Wrist)   Pulse 85   Temp 97.9 F (36.6 C) (Oral)   Resp 17   Ht 5\' 5"  (1.651 m)   Wt 61.1 kg (134 lb 11.2 oz)   SpO2 98%   BMI 22.42 kg/m   General: No acute distress. Alert and oriented x3. Cardiovascular: Regular rate and rhythm; no murmurs, gallops, or rubs. Respiratory: Clear to auscultation bilaterally. No wheezes or rhonchi noted. Extremities: No edema or erythema. Pulses 2/4 in all 4 extremities.  Discharge Instructions You were cared for by a hospitalist during your hospital stay.  If you have any questions about your discharge medications or the care you received while you were in the hospital after you are discharged, you can call the unit and asked to speak with the hospitalist on call if the hospitalist that took care of you is not available. Once you are discharged, your primary care physician will handle any further  medical issues. Please note that NO REFILLS for any discharge medications will be authorized once you are discharged, as it is imperative that you return to your primary care physician (or establish a relationship with a primary care physician if you do not have one) for your aftercare needs so that they can reassess your need for medications and monitor your lab values.     Allergies  Allergen Reactions  . Pneumovax 23 [Pneumococcal Vac Polyvalent] Other (See Comments)    Pt had severe arm pain  . Amaryl [Glimepiride] Nausea Only  . Cephalexin Hives  . Sulfa Antibiotics Rash   Follow-up Information    Charlie Pitter, PA-C Follow up on 09/11/2017.   Specialties:  Cardiology, Radiology Why:  Cardiology Follow-Up on 09/11/2017 at 8:30AM.  Contact information: [address] [place] [address] [phone]           Allergies as of 08/18/2017      Reactions   Pneumovax 23 [pneumococcal Vac Polyvalent] Other (See Comments)   Pt had severe arm pain   Amaryl [glimepiride] Nausea Only   Cephalexin Hives   Sulfa Antibiotics Rash      Medication List    STOP taking these medications   ALEVE 220 MG tablet Generic drug:  naproxen sodium     TAKE these medications   acetaminophen 325 MG tablet Commonly known as:  TYLENOL Take 325-650 mg by mouth every 6 (six) hours as needed (for pain).   ALIVE WOMENS GUMMY Chew Chew 2 tablets by mouth daily.   aspirin 81 MG EC tablet Take 1 tablet (81 mg total) by mouth daily. What changed:  medication strength  how much to take  when to take this   atorvastatin 20 MG tablet Commonly known as:  LIPITOR Take 1 tablet (20 mg total) by mouth daily.   ferrous sulfate 325 (65 FE) MG tablet Take 1 tablet (325 mg total) by mouth 2 (two) times daily with a meal.   FLUoxetine 20 MG capsule Commonly known as:  PROZAC Take 20 or 40 mg daily for depression What changed:  how much to take  how to take this  when  to take this  additional instructions   glucose blood test strip Commonly known as:  ONETOUCH VERIO TEST BLOOD SUGARS TWICE DAILY.   liraglutide 18 MG/3ML Sopn Commonly known as:  VICTOZA INJECT 1.2 MG INTO THE SKIN DAILY   losartan 50 MG tablet Commonly known as:  COZAAR Take 1 tablet (50 mg total) by mouth daily. What changed:  medication strength  how much to take  how to take this  when to take this  additional instructions   metFORMIN 1000 MG tablet Commonly known as:  GLUCOPHAGE TAKE 1 TABLET BY MOUTH TWICE DAILY WITH A MEAL. What changed:  how much to take  how to take this  when to take this  additional instructions   nitroGLYCERIN 0.4 MG SL tablet Commonly known as:  NITROSTAT Place 1 tablet (0.4 mg total) under the tongue every 5 (five) minutes as needed for chest pain.   omeprazole 20 MG capsule Commonly known  as:  PRILOSEC Take 1 capsule (20 mg total) by mouth daily.   propranolol 10 MG tablet Commonly known as:  INDERAL TAKE 1 TABLET BY MOUTH EVERY 8 TO 12 HOURS AS NEEDED FOR TREMOR What changed:  how much to take  how to take this  when to take this  additional instructions   traZODone 150 MG tablet Commonly known as:  DESYREL Take 1 tablet (150 mg total) by mouth at bedtime.       The results of significant diagnostics from this hospitalization (including imaging, microbiology, ancillary and laboratory) are listed below for reference.    Significant Diagnostic Studies: Dg Chest 2 View  Result Date: 08/17/2017 CLINICAL DATA:  Chest heaviness. EXAM: CHEST  2 VIEW COMPARISON:  Chest x-ray 11/16/2012. FINDINGS: Mediastinum and hilar structures normal. Lungs are clear. No pleural effusion or pneumothorax. Heart size normal. No acute bony abnormality. IMPRESSION: No acute cardiopulmonary disease. Electronically Signed   By: Marcello Moores  Register   On: 08/17/2017 11:52   Ct Cardiac Morph/pulm Vein W/cm&w/o Ca Score  Addendum Date:  08/18/2017   ADDENDUM REPORT: 08/18/2017 10:38 EXAM: OVER-READ INTERPRETATION  CT CHEST The following report is an over-read performed by radiologist Dr. Collene Leyden [facility] on 08/18/2017. This over-read does not include interpretation of cardiac or coronary anatomy or pathology. The coronary CTA interpretation by the cardiologist is attached. COMPARISON:  None. FINDINGS: Heart is normal size.  Visualized aorta is normal caliber. No adenopathy in the lower mediastinum or hila. No confluent airspace opacities or effusions. Imaging into the upper abdomen shows no acute findings. Chest wall soft tissues are unremarkable. No acute bony abnormality. IMPRESSION: No acute or significant extracardiac abnormality. Electronically Signed   By: Rolm Baptise M.D.   On: 08/18/2017 10:38   Result Date: 08/18/2017 CLINICAL DATA:  25 -year-old female with diabetes, hypertension and chest pain. EXAM: Cardiac/Coronary  CT TECHNIQUE: The patient was scanned on a [employer]. FINDINGS: A 120 kV prospective scan was triggered in the descending thoracic aorta at 111 HU's. Axial non-contrast 3 mm slices were carried out through the heart. The data set was analyzed on a dedicated work station and scored using the [employer]. Gantry rotation speed was 250 msecs and collimation was .6 mm. 20 mg of iv Metoprolol and 1.2 mg of sl NTG was given. The 3D data set was reconstructed in 5% intervals of the 67-82 % of the R-R cycle. Diastolic phases were analyzed on a dedicated work station using MPR, MIP and VRT modes. The patient received 80 cc of contrast. Aorta: Normal size. Trivial calcifications in the aortic root. No dissection. Aortic Valve:  Trileaflet.  No calcifications. Coronary Arteries: Normal coronary origin. Right and left co-dominance. RCA is a large dominant artery that gives rise to PDA and two acute marginal branches. There is no plaque. Left main is a large artery that gives rise to LAD, ramus  intermedius and LCX arteries. LAD is a large vessel that gives rise to two small diagonal branches and has no plaque. RI is a small artery that has no plaque. LCX is a non-dominant artery that gives rise to one small OM1 branch and PLVB. There is no plaque. Other findings: Normal pulmonary vein drainage into the left atrium. Normal let atrial appendage without a thrombus. Normal size of the pulmonary artery. IMPRESSION: 1. Coronary calcium score of 0. This was 0 percentile for age and sex matched control. 2. Normal coronary origin with right dominance. 3. No evidence  of CAD.  Consider non-cardiac sources of chest pain. Ena Dawley Electronically Signed: By: Ena Dawley On: 08/18/2017 09:54    Microbiology: No results found for this or any previous visit (from the past 240 hour(s)).   Labs: Basic Metabolic Panel:  Recent Labs Lab 08/17/17 1125 08/17/17 1857  NA 135  --   K 4.4  --   CL 100*  --   CO2 27  --   GLUCOSE 92  --   BUN 24*  --   CREATININE 1.18* 1.15*  CALCIUM 9.0  --    Liver Function Tests:  Recent Labs Lab 08/17/17 1125  AST 17  ALT 11*  ALKPHOS 56  BILITOT 0.6  PROT 7.1  ALBUMIN 4.0   No results for input(s): LIPASE, AMYLASE in the last 168 hours. No results for input(s): AMMONIA in the last 168 hours. CBC:  Recent Labs Lab 08/17/17 1125 08/17/17 1857  WBC 5.8 6.9  HGB 10.8* 10.4*  HCT 33.3* 32.7*  MCV 86.7 85.4  PLT 165 177   Cardiac Enzymes:  Recent Labs Lab 08/17/17 1125 08/17/17 1857  TROPONINI <0.03 <0.03   BNP: BNP (last 3 results) No results for input(s): BNP in the last 8760 hours.  ProBNP (last 3 results) No results for input(s): PROBNP in the last 8760 hours.  CBG:  Recent Labs Lab 08/17/17 2039 08/18/17 0941 08/18/17 1020 08/18/17 1135 08/18/17 1641  GLUCAP 114* 59* 97 217* 133*       Signed:  Kayleen Memos, MD [facility] 08/18/2017, 4:52 PM

## 2017-08-18 NOTE — Progress Notes (Signed)
Patient IV extravasated in the right [facility], patient had an 18g.  A total of 90 cc extravasate 50 cc of isovue of 370 and 40 cc of Saline.

## 2017-08-19 NOTE — Progress Notes (Signed)
This technologist spoke with patient about her IV extravasation.  Per patient "most all the swelling has gone down with just some bruising, arm is better"

## 2017-08-20 ENCOUNTER — Encounter: Payer: Self-pay | Admitting: Family Medicine

## 2017-08-21 ENCOUNTER — Telehealth: Payer: Self-pay

## 2017-08-21 NOTE — Telephone Encounter (Signed)
08/21/17   TCM Hospital Follow Up  Transition Care Management Follow-up Telephone Call  ADMISSION DATE: 08/17/17  DISCHARGE DATE: 08/18/17   How have you been since you were released from the hospital?  Patient states she feels fine.   Do you understand why you were in the hospital? Yes   Do you understand the discharge instrcutions? Yes    Items Reviewed:  Medications reviewed: Yes   Allergies reviewed: Yes   Dietary changes reviewed: No changes regular diet per patient.   Referrals reviewed: Yes appointment scheduled with PCP   Functional Questionnaire:   Activities of Daily Living (ADLs): Preforms all without assistance  Any patient concerns?  Her back pain continues   Confirmed importance and date/time of follow-up visits scheduled: Yes   Confirmed with patient if condition begins to worsen call PCP or go to the ER. Yes    Patient was given the office number and encouragred to call back with questions or concerns. Yes

## 2017-08-25 ENCOUNTER — Encounter: Payer: Self-pay | Admitting: Physician Assistant

## 2017-08-27 NOTE — Progress Notes (Signed)
[place] [address], [place], [place] [address] [phone] [id number]  Date:  08/30/2017   Name:  Abigail [name]   DOB:  11-12-1950   MRN:  [id number]  PCP:  Abigail Mclean, MD    Chief Complaint: Hospitalization Follow-up (Pt here for hosp f/u visit. Already had flu vaccine. )   History of Present Illness:  Abigail [name] is a 66 y.o. very pleasant female patient who presents with the following:  Hospital follow-up visit Seen in my office on 10/1 and ended up getting admitted overnight due to CP Gladly all checked out ok, her coronary calcium score was zero. She was released to home and is feeling basically back to normal No further CP   Admit date: 08/17/2017 Discharge date: 08/18/2017  Recommendations for Outpatient Follow-up:  Follow up with cardiologist Dr. Meda [name] on Sep 11 2017 at 8:30 am Follow up with PCP: Dr. Lorelei [name] within a week CT coronary done today 08/18/17: No sign of CAD Transthoracic echocardiogram done today 08/18/17: EF 55-60%, no motion wall abnormalities; mild mitral regurgitation.  Discharge Diagnoses:       Active Hospital Problems   Diagnosis Date Noted  . Chest pain 10/23/2012    Resolved Hospital Problems   Diagnosis Date Noted Date Resolved  No resolved problems to display.    History of present illness:  The patient is a 66 YO CF with past medical history significant for type 2 diabetes, HTN, HLD, anxiety, chronic depression, who presented on 09/17/17 with complaints of chest heaviness of 1 day duration radiating to her neck bilaterally. Troponin negative x2, EKG reviewed by self unremarkable for any ST-T changes. Cardiology consulted and followed the patient. Cozaar dose increased from 25 mg daily to 50 mg daily.  No events overnight. Patient given nitroglycerin SL as needed during her hospital stay. Patient states she feels better today. Denies chest pain,  dyspnea, or palpitations. CT coronary done this morning unremarkable for any acute findings. Coronary calcium score of 0 with no evidence of CAD as reported by cardiology, Dr. Meda [name].  Patient was made aware of her results, labs, EKG, and CT coronary. All questions answered to her satisfaction. She will follow up with cardiology on Sep 11, 2017 at 0830 and with her PCP within a week for post hospitalization visit.  Hospital Course:  Active Problems:   Chest pain Resolved; most likely non cardiac per cardiology. Troponin negative x2; EKG unremarkable, CT coronary unremarkable for CAD. Nitro SL given 0.4 mg prn. IV fluid NS  Acute hypoglycemia Most likely 2/2 to NPO in the setting of recent diabetic meds use On victoza and metformin at home; not given during hospital stay. BS 59 corrected to 217 after resuming po.  Anxiety/depression - stable - continue prozac, trazodone  CKD stage 3 - stable; no AKI. - GFR 47, cr 1.15; baseline cr 1.1 - Avoid nephrotoxic meds/agents - stop Naproxen  Chronic normocytic anemia - stable; no sign of active bleeding - baseline Hg 10.0 - continue ferrous sulfate  GERD - stable - continue PPIs, omeprazole  HTN - stable - continue cozaar, propranolol  HLD - stable - continue atorvastatin  Procedures: Transthoracic echocardiogram done today 08/18/17 EF 55-60%, no motion wall abnormalities, mild mitral regurgitation.  Consultations: Cardiology, Dr. Meda [name]  She is due for a foot exam - will do today  mammo in April She has an eye appt coming up in December Lab Results  Component Value Date   HGBA1C 5.8 02/27/2017  she continues to notice some pain in her thoracic spine-  Otherwise she is feeling fine No further chest pain    Patient Active Problem List   Diagnosis Date Noted  . Essential hypertension   . Gastroesophageal reflux disease without esophagitis   . Microscopic hematuria 09/16/2015  . Trimalleolar fracture  of ankle, closed 11/17/2014  . Other and unspecified hyperlipidemia 07/24/2013  . Chest pain 10/23/2012  . Diabetes mellitus ([place]) 10/08/2012  . Anxiety 10/08/2012    Past Medical History:  Diagnosis Date  . Anxiety   . Depression   . Diabetes mellitus    Type 2  . GERD (gastroesophageal reflux disease)   . Hematuria   . Hyperlipidemia   . Hypertension     Past Surgical History:  Procedure Laterality Date  . CHOLECYSTECTOMY N/A 06/03/2015   Procedure: LAPAROSCOPIC CHOLECYSTECTOMY WITH INTRAOPERATIVE CHOLANGIOGRAM;  Surgeon: Donnie Mesa, MD;  Location: [place];  Service: General;  Laterality: N/A;  . COLONOSCOPY    . FRACTURE SURGERY    . HAMMER TOE SURGERY    . ORIF ANKLE FRACTURE Left 11/17/2014   Procedure: OPEN REDUCTION INTERNAL FIXATION (ORIF) LEFT TRIMALLEOLAR ANKLE FRACTURE;  Surgeon: Marianna Payment, MD;  Location: [place];  Service: Orthopedics;  Laterality: Left;  . OVARIAN CYST REMOVAL      Social History  Substance Use Topics  . Smoking status: Never Smoker  . Smokeless tobacco: Never Used  . Alcohol use 0.6 oz/week    1 Glasses of wine per week    Family History  Problem Relation Age of Onset  . Diabetes Mother   . Hyperlipidemia Mother   . Diabetes Father   . Heart disease Father 55       AMI x 2; first AMI age 22  . Stroke Father   . Heart disease Brother        defibrillator; CHF  . Hyperlipidemia Brother     Allergies  Allergen Reactions  . Pneumovax 23 [Pneumococcal Vac Polyvalent] Other (See Comments)    Pt had severe arm pain  . Amaryl [Glimepiride] Nausea Only  . Cephalexin Hives  . Sulfa Antibiotics Rash    Medication list has been reviewed and updated.  Current Outpatient Prescriptions on File Prior to Visit  Medication Sig Dispense Refill  . acetaminophen (TYLENOL) 325 MG tablet Take 325-650 mg by mouth every 6 (six) hours as needed (for pain).    [name] aspirin EC 81 MG EC tablet Take 1 tablet (81 mg total) by mouth daily.    [name]  atorvastatin (LIPITOR) 20 MG tablet Take 1 tablet (20 mg total) by mouth daily. 90 tablet 3  . ferrous sulfate 325 (65 FE) MG tablet Take 1 tablet (325 mg total) by mouth 2 (two) times daily with a meal. 60 tablet 3  . FLUoxetine (PROZAC) 20 MG capsule Take 20 or 40 mg daily for depression (Patient taking differently: Take 20 mg by mouth See admin instructions. ONE TO TWO TIMES A DAY) 180 capsule 3  . glucose blood (ONETOUCH VERIO) test strip TEST BLOOD SUGARS TWICE DAILY. 200 each 2  . liraglutide (VICTOZA) 18 MG/3ML SOPN INJECT 1.2 MG INTO THE SKIN DAILY 18 mL 1  . losartan (COZAAR) 50 MG tablet Take 1 tablet (50 mg total) by mouth daily. 30 tablet 0  . metFORMIN (GLUCOPHAGE) 1000 MG tablet TAKE 1 TABLET BY MOUTH TWICE DAILY WITH A MEAL. (Patient taking differently: Take 1,000 mg  by mouth 2 (two) times daily with a meal. ) 180 tablet 3  . Multiple Vitamins-Minerals (ALIVE WOMENS GUMMY) CHEW Chew 2 tablets by mouth daily.    [name] omeprazole (PRILOSEC) 20 MG capsule Take 1 capsule (20 mg total) by mouth daily. 90 capsule 3  . propranolol (INDERAL) 10 MG tablet TAKE 1 TABLET BY MOUTH EVERY 8 TO 12 HOURS AS NEEDED FOR TREMOR (Patient taking differently: Take 10 mg by mouth See admin instructions. EVERY 8 TO 12 HOURS AS NEEDED FOR TREMORS) 60 tablet 5  . traZODone (DESYREL) 150 MG tablet Take 1 tablet (150 mg total) by mouth at bedtime. 90 tablet 3  . nitroGLYCERIN (NITROSTAT) 0.4 MG SL tablet Place 1 tablet (0.4 mg total) under the tongue every 5 (five) minutes as needed for chest pain. 6 tablet 0   No current facility-administered medications on file prior to visit.     Review of Systems:  As per HPI- otherwise negative.   Physical Examination: Vitals:   08/30/17 0901  BP: 132/72  Pulse: 95  Temp: 98.2 F (36.8 C)  SpO2: 98%   Vitals:   08/30/17 0901  Weight: 136 lb 12.8 oz (62.1 kg)  Height: 5\' 5"  (1.651 m)   Body mass index is 22.76 kg/m. Ideal Body Weight: Weight in (lb) to have  BMI = 25: 149.9  GEN: WDWN, NAD, Non-toxic, A & O x 3, normal weight, looks well  HEENT: Atraumatic, Normocephalic. Neck supple. No masses, No LAD.  Bilateral TM wnl, oropharynx normal.  PEERL,EOMI.   Ears and Nose: No external deformity. CV: RRR, No M/G/R. No JVD. No thrill. No extra heart sounds. PULM: CTA B, no wheezes, crackles, rhonchi. No retractions. No resp. distress. No accessory muscle use. ABD: S, NT, ND, +BS. No rebound. No HSM. EXTR: No c/c/e NEURO Normal gait.  PSYCH: Normally interactive. Conversant. Not depressed or anxious appearing.  Calm demeanor.  She indicates the mid thoracic back-between the shoulder blades and on the left side- as the area of concern.  However no tenderness to palpation or with ROM of her left shoulder   Assessment and Plan: Dyslipidemia  Microcytic anemia - Plan: Ferritin  Diabetes mellitus type 2 in nonobese ([place]) - Plan: Hemoglobin W0J, Basic metabolic panel  Follow-up visit today She has been noted to have anemia- was microcytic and her ferritin was low, but she is now normocytic Will check labs for her today as above Of note she did have a colonoscopy in 2014, tested heme negative stool in March of this year  She did have a CT scan during recent hospital stay but it was for coronary calcium score. Uncertain if this got a good look at her T spine or not   Signed Lamar Blinks, MD  Spoke with radiologist who read her CT coronary calcium score.  This type of CT does not give them much info about her thoracic spine unfortunately She did have a negative CXR on 10/11 Her GFR is a bit low- will have her hold her metformin for a week or so and recheck  Message sent on mychart:  Hi Debbie- I spoke with the radiologist who read your CT. This type of CT chest really does not get a great look at the spine bones. What he could see was ok, but we could do some plain films of your spine as well if you want to get a better look. I am glad to  order these for you. If you want to try  seeing your chiropractor for a bit I think that is fine too.   Your A1c looks fine. However, since your kidney function is slightly low today I want you to HOLD (not take) your metformin for the time being. You can continue the Victoza. Let's plan to repeat your kidney function in 1-2 weeks, lab visit only. I suspect it will go back to normal and is probably a bit off due to recent hospital stay, contrast material for your CT   Your ferritin (iron) is better but still on the very low side of normal. I am going to refill your iron- please take it ONCE a day to help maintain your level     Results for orders placed or performed in visit on 08/30/17  Ferritin  Result Value Ref Range   Ferritin 11.8 10.0 - 291.0 ng/mL  Hemoglobin A1c  Result Value Ref Range   Hgb A1c MFr Bld 6.1 4.6 - 6.5 %  Basic metabolic panel  Result Value Ref Range   Sodium 140 135 - 145 mEq/L   Potassium 4.8 3.5 - 5.1 mEq/L   Chloride 101 96 - 112 mEq/L   CO2 27 19 - 32 mEq/L   Glucose, Bld 173 (H) 70 - 99 mg/dL   BUN 23 6 - 23 mg/dL   Creatinine, Ser 1.28 (H) 0.40 - 1.20 mg/dL   Calcium 9.0 8.4 - 10.5 mg/dL   GFR 44.08 (L) >60.00 mL/min

## 2017-08-30 ENCOUNTER — Encounter: Payer: Self-pay | Admitting: Family Medicine

## 2017-08-30 ENCOUNTER — Ambulatory Visit (INDEPENDENT_AMBULATORY_CARE_PROVIDER_SITE_OTHER): Payer: Medicare Other | Admitting: Family Medicine

## 2017-08-30 VITALS — BP 132/72 | HR 95 | Temp 98.2°F | Ht 65.0 in | Wt 136.8 lb

## 2017-08-30 DIAGNOSIS — E119 Type 2 diabetes mellitus without complications: Secondary | ICD-10-CM | POA: Diagnosis not present

## 2017-08-30 DIAGNOSIS — R7989 Other specified abnormal findings of blood chemistry: Secondary | ICD-10-CM

## 2017-08-30 DIAGNOSIS — E785 Hyperlipidemia, unspecified: Secondary | ICD-10-CM

## 2017-08-30 DIAGNOSIS — D509 Iron deficiency anemia, unspecified: Secondary | ICD-10-CM | POA: Diagnosis not present

## 2017-08-30 LAB — BASIC METABOLIC PANEL
BUN: 23 mg/dL (ref 6–23)
CALCIUM: 9 mg/dL (ref 8.4–10.5)
CO2: 27 meq/L (ref 19–32)
Chloride: 101 mEq/L (ref 96–112)
Creatinine, Ser: 1.28 mg/dL — ABNORMAL HIGH (ref 0.40–1.20)
GFR: 44.08 mL/min — ABNORMAL LOW (ref >60.00)
GLUCOSE: 173 mg/dL — AB (ref 70–99)
Potassium: 4.8 mEq/L (ref 3.5–5.1)
SODIUM: 140 meq/L (ref 135–145)

## 2017-08-30 LAB — HEMOGLOBIN A1C: Hgb A1c MFr Bld: 6.1 % (ref 4.6–6.5)

## 2017-08-30 LAB — FERRITIN: Ferritin: 11.8 ng/mL (ref 10.0–291.0)

## 2017-08-30 MED ORDER — FERROUS SULFATE 325 (65 FE) MG PO TABS: 325.0000 mg | ORAL_TABLET | Freq: Two times a day (BID) | ORAL | 3 refills | Status: DC

## 2017-08-30 NOTE — Patient Instructions (Addendum)
It was good to see you today- so glad that you are ok!!   We will check on your A1c and iron level today.  I will be in touch and we can see if you still need to be on iron.    If you like, I can take over managing your diabetes since you are under excellent control  Assuming all is well, we can plan to visit in about 6 months

## 2017-09-01 ENCOUNTER — Other Ambulatory Visit: Payer: Self-pay | Admitting: Family Medicine

## 2017-09-08 ENCOUNTER — Encounter: Payer: Self-pay | Admitting: Physician Assistant

## 2017-09-08 NOTE — Progress Notes (Deleted)
Cardiology Office Note    Date:  09/08/2017  ID:  Abigail [name], DOB 07/28/1951, MRN [id number] PCP:  Darreld Mclean, MD  Cardiologist:  ***   Chief Complaint: ***  History of Present Illness:  Abigail [name] is a 66 y.o. female with history of DM, HTN, HLD, GERD, depression, anxiety, recent normal cardiac CT, mild mitral regurgitation, CKD stage III, anemia who presents for post-hospital follow-up.  Abigail [name] saw Dr. Johnsie Cancel in clinic in 2013 for evaluation of chest pain. At that visit, chest pain was noted to be atypical and exercise treadmill stress test was without ischemia. She states that it was thought to be related to GERD. She has not had chest pain since that visit until recent recurrence 08/2017. She was readmitted with chest heaviness, near-constant for several hours. She reported receiving one nitro SL with partial relief. She still has the chest heaviness now. Despite fairly consistent discomfort, troponins were negative and EKG was nonacute. Cardiac CT 08/18/17 showed no evidence of CAD; recommended to consider noncardiac sources of pain. Overread was unremarkable as well. 2D echo showed normal LVEF 42-68%, normal diastolic function, mild mitral regurg. Most recent labs showed K 4.8, A1C wnl, Cr 1.28, LDL 63, Hgb 10.4 (12 in 2016, 8-10 in 2018).   Noncardiac chest pain Essential HTN CKD III Anemia, unspecified    Past Medical History:  Diagnosis Date  . Anxiety   . Depression   . Diabetes mellitus    Type 2  . GERD (gastroesophageal reflux disease)   . Hematuria   . Hyperlipidemia   . Hypertension     Past Surgical History:  Procedure Laterality Date  . CHOLECYSTECTOMY N/A 06/03/2015   Procedure: LAPAROSCOPIC CHOLECYSTECTOMY WITH INTRAOPERATIVE CHOLANGIOGRAM;  Surgeon: Donnie Mesa, MD;  Location: [place];  Service: General;  Laterality: N/A;  . COLONOSCOPY    . FRACTURE SURGERY    . HAMMER TOE SURGERY    . ORIF ANKLE FRACTURE Left 11/17/2014     Procedure: OPEN REDUCTION INTERNAL FIXATION (ORIF) LEFT TRIMALLEOLAR ANKLE FRACTURE;  Surgeon: Marianna Payment, MD;  Location: [place];  Service: Orthopedics;  Laterality: Left;  . OVARIAN CYST REMOVAL      Current Medications: No outpatient prescriptions have been marked as taking for the 09/11/17 encounter (Appointment) with Charlie Pitter, PA-C.     Allergies:   Pneumovax 23 [pneumococcal vac polyvalent]; Amaryl [glimepiride]; Cephalexin; and Sulfa antibiotics   Social History   Social History  . Marital status: Married    Spouse name: N/A  . Number of children: N/A  . Years of education: N/A   Social History Main Topics  . Smoking status: Never Smoker  . Smokeless tobacco: Never Used  . Alcohol use 0.6 oz/week    1 Glasses of wine per week  . Drug use: No  . Sexual activity: Yes    Birth control/ protection: None   Other Topics Concern  . Not on file   Social History Narrative   Marital status: married x 1970.      Children:  2 children; 7 grandchildren.      Employment:  [employer].      Tobacco: none       Alcohol: sporadic       Exercises: walking 3 days per week.     Family History:  Family History  Problem Relation Age of Onset  . Diabetes Mother   . Hyperlipidemia Mother   . Diabetes Father   .  Heart disease Father 65       AMI x 2; first AMI age 81  . Stroke Father   . Heart disease Brother        defibrillator; CHF  . Hyperlipidemia Brother    ***  ROS:   Please see the history of present illness. Otherwise, review of systems is positive for ***.  All other systems are reviewed and otherwise negative.    PHYSICAL EXAM:   VS:  There were no vitals taken for this visit.  BMI: There is no height or weight on file to calculate BMI. GEN: Well nourished, well developed, in no acute distress  HEENT: normocephalic, atraumatic Neck: no JVD, carotid bruits, or masses Cardiac: ***RRR; no murmurs, rubs, or gallops, no edema   Respiratory:  clear to auscultation bilaterally, normal work of breathing GI: soft, nontender, nondistended, + BS MS: no deformity or atrophy  Skin: warm and dry, no rash Neuro:  Alert and Oriented x 3, Strength and sensation are intact, follows commands Psych: euthymic mood, full affect  Wt Readings from Last 3 Encounters:  08/30/17 136 lb 12.8 oz (62.1 kg)  08/18/17 134 lb 11.2 oz (61.1 kg)  08/17/17 135 lb (61.2 kg)      Studies/Labs Reviewed:   EKG:  EKG was ordered today and personally reviewed by me and demonstrates *** EKG was not ordered today.***  Recent Labs: 01/02/2017: TSH 1.45 08/17/2017: ALT 11; Hemoglobin 10.4; Platelets 177 08/30/2017: BUN 23; Creatinine, Ser 1.28; Potassium 4.8; Sodium 140   Lipid Panel    Component Value Date/Time   CHOL 139 08/18/2017 0642   TRIG 161 (H) 08/18/2017 0642   HDL 44 08/18/2017 0642   CHOLHDL 3.2 08/18/2017 0642   VLDL 32 08/18/2017 0642   LDLCALC 63 08/18/2017 0642    Additional studies/ records that were reviewed today include: Summarized above.***    ASSESSMENT & PLAN:   1. ***  Disposition: F/u with ***   Medication Adjustments/Labs and Tests Ordered: Current medicines are reviewed at length with the patient today.  Concerns regarding medicines are outlined above. Medication changes, Labs and Tests ordered today are summarized above and listed in the Patient Instructions accessible in Encounters.   Signed, Charlie Pitter, PA-C  09/08/2017 3:56 PM    [facility] [place], [place], [place]  [address] Phone: [phone]; Fax: [phone]

## 2017-09-11 ENCOUNTER — Ambulatory Visit: Payer: Federal, State, Local not specified - PPO | Admitting: Physician Assistant

## 2017-09-11 ENCOUNTER — Telehealth: Payer: Self-pay | Admitting: Physician Assistant

## 2017-09-11 NOTE — Telephone Encounter (Signed)
Chart reviewed, recent hospital stay for atypical CP, normal cardiac workup including normal cardiac CT, normal LVEF. Pt saw PCP 10/24 and was asymptomatic and feeling fine. PCP following renal function. I called pt this AM to check on her and to discuss need for f/u. She currently has no further symptoms. I offered her opportunity to come in for recheck versus f/u with cardiology PRN given no active cardiac issues. She elected the latter and will be contact with [place] for any further needs. Dayna Dunn PA-C

## 2017-09-15 DIAGNOSIS — E113291 Type 2 diabetes mellitus with mild nonproliferative diabetic retinopathy without macular edema, right eye: Secondary | ICD-10-CM | POA: Diagnosis not present

## 2017-09-18 NOTE — Telephone Encounter (Signed)
Called pt- the computer alerted me that she did not read her recnet mychart message. She will go back and look at this, and will come in for a lab visit only this week

## 2017-09-20 ENCOUNTER — Other Ambulatory Visit (INDEPENDENT_AMBULATORY_CARE_PROVIDER_SITE_OTHER): Payer: Medicare Other

## 2017-09-20 DIAGNOSIS — R7989 Other specified abnormal findings of blood chemistry: Secondary | ICD-10-CM | POA: Diagnosis not present

## 2017-09-20 DIAGNOSIS — E119 Type 2 diabetes mellitus without complications: Secondary | ICD-10-CM

## 2017-09-20 LAB — BASIC METABOLIC PANEL
BUN: 18 mg/dL (ref 6–23)
CHLORIDE: 101 meq/L (ref 96–112)
CO2: 29 meq/L (ref 19–32)
Calcium: 9.1 mg/dL (ref 8.4–10.5)
Creatinine, Ser: 1.3 mg/dL — ABNORMAL HIGH (ref 0.40–1.20)
GFR: 43.29 mL/min — ABNORMAL LOW (ref >60.00)
Glucose, Bld: 142 mg/dL — ABNORMAL HIGH (ref 70–99)
Potassium: 4.5 mEq/L (ref 3.5–5.1)
SODIUM: 140 meq/L (ref 135–145)

## 2017-09-21 MED ORDER — SITAGLIPTIN PHOSPHATE 50 MG PO TABS: 50.0000 mg | ORAL_TABLET | Freq: Every day | ORAL | 9 refills | Status: DC

## 2017-09-21 NOTE — Progress Notes (Signed)
Received her repeat labs and called her  She is had metformin and will need to stop using this for good due to GFR- her A1c has looked good and hopefully will not go up as she continues to use victoza. However [name] notes that as she has held her metformin the last few days her glucose is going up to around 200 which is not typical for her. Decided to replace her metformin with januvia 50 mg She will let me know if any problems, and we plan to check an A1c in about 2 months lab only   Lab Results  Component Value Date   HGBA1C 6.1 08/30/2017     Results for orders placed or performed in visit on 36/12/24  Basic metabolic panel  Result Value Ref Range   Sodium 140 135 - 145 mEq/L   Potassium 4.5 3.5 - 5.1 mEq/L   Chloride 101 96 - 112 mEq/L   CO2 29 19 - 32 mEq/L   Glucose, Bld 142 (H) 70 - 99 mg/dL   BUN 18 6 - 23 mg/dL   Creatinine, Ser 1.30 (H) 0.40 - 1.20 mg/dL   Calcium 9.1 8.4 - 10.5 mg/dL   GFR 43.29 (L) >60.00 mL/min

## 2017-09-21 NOTE — Addendum Note (Signed)
Addended by: Lamar Blinks C on: 09/21/2017 07:08 PM   Modules accepted: Orders

## 2017-09-22 ENCOUNTER — Encounter: Payer: Self-pay | Admitting: Family Medicine

## 2017-10-18 ENCOUNTER — Encounter: Payer: Self-pay | Admitting: Family Medicine

## 2017-10-19 ENCOUNTER — Encounter: Payer: Self-pay | Admitting: Family Medicine

## 2017-10-19 DIAGNOSIS — E119 Type 2 diabetes mellitus without complications: Secondary | ICD-10-CM

## 2017-10-20 MED ORDER — LIRAGLUTIDE 18 MG/3ML ~~LOC~~ SOPN: PEN_INJECTOR | SUBCUTANEOUS | 3 refills | Status: DC

## 2017-11-22 ENCOUNTER — Encounter: Payer: Self-pay | Admitting: Family Medicine

## 2017-11-22 ENCOUNTER — Ambulatory Visit (INDEPENDENT_AMBULATORY_CARE_PROVIDER_SITE_OTHER): Payer: Medicare Other | Admitting: Family Medicine

## 2017-11-22 VITALS — BP 104/68 | HR 105 | Temp 98.2°F | Ht 65.0 in | Wt 138.2 lb

## 2017-11-22 DIAGNOSIS — M7662 Achilles tendinitis, left leg: Secondary | ICD-10-CM

## 2017-11-22 DIAGNOSIS — K2900 Acute gastritis without bleeding: Secondary | ICD-10-CM

## 2017-11-22 MED ORDER — ONDANSETRON HCL 4 MG PO TABS: 4.0000 mg | ORAL_TABLET | Freq: Three times a day (TID) | ORAL | 0 refills | Status: DC | PRN

## 2017-11-22 NOTE — Progress Notes (Signed)
Pre visit review using our clinic review tool, if applicable. No additional management support is needed unless otherwise documented below in the visit note. 

## 2017-11-22 NOTE — Progress Notes (Signed)
Musculoskeletal Exam  Patient: Abigail [name] DOB: January 27, 1951  DOS: 11/22/2017  SUBJECTIVE:  Chief Complaint:   Chief Complaint  Patient presents with  . Ankle Pain    left ankle pain for 2 weeks  . Nausea    Abigail [name] is a 67 y.o.  female for evaluation and treatment of L ankle pain.   Onset:  2 weeks ago.  No injury or change in activity. Location: Over achilles tendon Character:  aching  Progression of issue:  is unchanged Associated symptoms: Unsteady on feet Treatment: to date has been wearing ankle brace.   Neurovascular symptoms: no  She also combines of 2 days of nausea, diffuse abdominal pain, and general feeling of malaise.  No sick contacts.  Her bowel movements have been normal.  She is not been eating as much as normal.  Denies fevers, bleeding, vomiting, or recent travel.  ROS: Musculoskeletal/Extremities: +heel pain Const: No fevers  Past Medical History:  Diagnosis Date  . Anemia, unspecified   . Anxiety   . CKD (chronic kidney disease), stage III ([place])   . Depression   . Diabetes mellitus    Type 2  . GERD (gastroesophageal reflux disease)   . Hematuria   . Hyperlipidemia   . Hypertension   . Mild mitral regurgitation   . Normal coronary arteries    a. By cardiac CT 08/2017.   Past Surgical History:  Procedure Laterality Date  . CHOLECYSTECTOMY N/A 06/03/2015   Procedure: LAPAROSCOPIC CHOLECYSTECTOMY WITH INTRAOPERATIVE CHOLANGIOGRAM;  Surgeon: Donnie Mesa, MD;  Location: [place];  Service: General;  Laterality: N/A;  . COLONOSCOPY    . FRACTURE SURGERY    . HAMMER TOE SURGERY    . ORIF ANKLE FRACTURE Left 11/17/2014   Procedure: OPEN REDUCTION INTERNAL FIXATION (ORIF) LEFT TRIMALLEOLAR ANKLE FRACTURE;  Surgeon: Marianna Payment, MD;  Location: [place];  Service: Orthopedics;  Laterality: Left;  . OVARIAN CYST REMOVAL     Allergies  Allergen Reactions  . Pneumovax 23 [Pneumococcal Vac Polyvalent] Other (See Comments)    Pt  had severe arm pain  . Amaryl [Glimepiride] Nausea Only  . Cephalexin Hives  . Sulfa Antibiotics Rash   Current Outpatient Medications on File Prior to Visit  Medication Sig Dispense Refill  . acetaminophen (TYLENOL) 325 MG tablet Take 325-650 mg by mouth every 6 (six) hours as needed (for pain).    [name] aspirin EC 81 MG EC tablet Take 1 tablet (81 mg total) by mouth daily.    [name] atorvastatin (LIPITOR) 20 MG tablet Take 1 tablet (20 mg total) by mouth daily. 90 tablet 3  . ferrous sulfate 325 (65 FE) MG tablet Take 1 tablet (325 mg total) by mouth 2 (two) times daily with a meal. 60 tablet 3  . FLUoxetine (PROZAC) 20 MG capsule Take 20 or 40 mg daily for depression (Patient taking differently: Take 20 mg by mouth See admin instructions. ONE TO TWO TIMES A DAY) 180 capsule 3  . glucose blood (ONETOUCH VERIO) test strip TEST BLOOD SUGARS TWICE DAILY. 200 each 2  . liraglutide (VICTOZA) 18 MG/3ML SOPN INJECT 1.8 MG INTO THE SKIN DAILY 18 mL 3  . losartan (COZAAR) 50 MG tablet Take 1 tablet (50 mg total) by mouth daily. 30 tablet 0  . Multiple Vitamins-Minerals (ALIVE WOMENS GUMMY) CHEW Chew 2 tablets by mouth daily.    . nitroGLYCERIN (NITROSTAT) 0.4 MG SL tablet PLACE 1 TABLET UNDER THE TONGUE EVERY 5 MINUTES AS NEEDED FOR  CHEST PAIN 25 tablet 0  . omeprazole (PRILOSEC) 20 MG capsule Take 1 capsule (20 mg total) by mouth daily. 90 capsule 3  . propranolol (INDERAL) 10 MG tablet TAKE 1 TABLET BY MOUTH EVERY 8 TO 12 HOURS AS NEEDED FOR TREMOR (Patient taking differently: Take 10 mg by mouth See admin instructions. EVERY 8 TO 12 HOURS AS NEEDED FOR TREMORS) 60 tablet 5  . sitaGLIPtin (JANUVIA) 50 MG tablet Take 1 tablet (50 mg total) daily by mouth. 30 tablet 9  . traZODone (DESYREL) 150 MG tablet Take 1 tablet (150 mg total) by mouth at bedtime. 90 tablet 3   Objective: VITAL SIGNS: BP 104/68 (BP Location: Left Arm, Patient Position: Sitting, Cuff Size: Normal)   Pulse (!) 105   Temp 98.2 F (36.8  C) (Oral)   Ht 5\' 5"  (1.651 m)   Wt 138 lb 4 oz (62.7 kg)   SpO2 95%   BMI 23.01 kg/m  Constitutional: Well formed, well developed. No acute distress. Cardiovascular: RRR Thorax & Lungs: CTAB Abd: Soft, diffusely ttp, ND Musculoskeletal: L heel.   Normal active range of motion: yes.   Normal passive range of motion: yes Tenderness to palpation: yes, over calcaneal tendon Deformity: no Ecchymosis: no Tests positive: None Tests negative: Squeeze, ant drawer of ankle Neurologic: Normal sensory function. No focal deficits noted.  Psychiatric: Normal mood. Age appropriate judgment and insight. Alert & oriented x 3.    Assessment:  Achilles tendinitis of left lower extremity  Acute gastritis, presence of bleeding unspecified, unspecified gastritis type - Plan: ondansetron (ZOFRAN) 4 MG tablet  Plan: Orders as above.  1. Ice, heat, stretches/exercises, flat shoe/brace if comfy, f/u in 3-4 weeks if no improvement will consider CAM walker vs PT vs sports med referral depending on how she is doing. 2. Supportive care, Zofran prn. Warning s/s's verbalized and written down.  F/u prn otherwise. The patient voiced understanding and agreement to the plan.   [place], DO 11/22/17  12:25 PM

## 2017-11-22 NOTE — Patient Instructions (Addendum)
Ice/cold pack over area for 10-15 min every 2-3 hours while awake.  OK to take Tylenol 1000 mg (2 extra strength tabs) or 975 mg (3 regular strength tabs) every 6 hours as needed.  Ibuprofen 400-600 mg (2-3 over the counter strength tabs) every 6 hours as needed for pain.  Continue wearing the brace if it is helpful.   Wear your flat shoe while not in your brace.   Things to look out for: fevers, bleeding, worsening symptoms.   Achilles Tendinitis Rehab It is normal to feel mild stretching, pulling, tightness, or discomfort as you do these exercises, but you should stop right away if you feel sudden pain or your pain gets worse.   Stretching and range of motion exercises These exercises warm up your muscles and joints and improve the movement and flexibility of your ankle. These exercises also help to relieve pain, numbness, and tingling. Exercise A: Standing wall calf stretch, knee straight  1. Stand with your hands against a wall. 2. Extend your affected leg behind you and bend your front knee slightly. Keep both of your heels on the floor. 3. Point the toes of your back foot slightly inward. 4. Keeping your heels on the floor and your back knee straight, shift your weight toward the wall. Do not allow your back to arch. You should feel a gentle stretch in your calf. 5. Hold this position for seconds. Repeat 2 times. Complete this stretch 3 times per week Exercise B: Standing wall calf stretch, knee bent 1. Stand with your hands against a wall. 2. Extend your affected leg behind you, and bend your front knee slightly. Keep both of your heels on the floor. 3. Point the toes of your back foot slightly inward. 4. Keeping your heels on the floor, unlock your back knee so that it is bent. You should feel a gentle stretch deep in your calf. 5. Hold this position for 30 seconds. Repeat 2 times. Complete this stretch 3 times per week.  Strengthening exercises These exercises build  strength and control of your ankle. Endurance is the ability to use your muscles for a long time, even after they get tired. Exercise C: Plantar flexion with band  1. Sit on the floor with your affected leg extended. You may put a pillow under your calf to give your foot more room to move. 2. Loop a rubber exercise band or tube around the ball of your affected foot. The ball of your foot is on the walking surface, right under your toes. The band or tube should be slightly tense when your foot is relaxed. If the band or tube slips, you can put on your shoe or put a washcloth between the band and your foot to help it stay in place. 3. Slowly point your toes downward, pushing them away from you. 4. Hold this position for 10 seconds. 5. Slowly release the tension in the band or tube, controlling smoothly until your foot is back to the starting position. Repeat 2 times. Complete this exercise 3 times per week. Exercise D: Heel raise with eccentric lower  1. Stand on a step with the balls of your feet. The ball of your foot is on the walking surface, right under your toes. ? Do not put your heels on the step. ? For balance, rest your hands on the wall or on a railing. 2. Rise up onto the balls of your feet. 3. Keeping your heels up, shift all of your weight to  your affected leg and pick up your other leg. 4. Slowly lower your affected leg so your heel drops below the level of the step. 5. Put down your foot. If told by your health care provider, build up to:  3 sets of 15 repetitions while keeping your knees straight.  3 sets of 15 repetitions while keeping your knees bent as far as told by your health care provider.  Complete this exercise 3 times per week. If this exercise is too easy, try doing it while wearing a backpack with weights in it. Balance exercises These exercises improve or maintain your balance. Balance is important in preventing falls. Exercise E: Single leg stand 1. Without  shoes, stand near a railing or in a door frame. Hold on to the railing or door frame as needed. 2. Stand on your affected foot. Keep your big toe down on the floor and try to keep your arch lifted. 3. Hold this position for 10 seconds. Repeat 2 times. Complete this exercise 3 times per week. If this exercise is too easy, you can try it with your eyes closed or while standing on a pillow. Make sure you discuss any questions you have with your health care provider. Document Released: 05/25/2005 Document Revised: 06/30/2016 Document Reviewed: 06/30/2015 Elsevier Interactive Patient Education  [employer].

## 2018-01-10 ENCOUNTER — Other Ambulatory Visit: Payer: Self-pay | Admitting: Family Medicine

## 2018-01-10 DIAGNOSIS — G250 Essential tremor: Secondary | ICD-10-CM

## 2018-01-31 NOTE — Progress Notes (Signed)
[facility] at [facility] [address], [place], [place] [address] [phone]  Date:  02/01/2018   Name:  Abigail [name]   DOB:  Dec 21, 1950   MRN:  [id number]  PCP:  Darreld Mclean, MD    Chief Complaint: Abdominal Pain (c/o abd pain and diarrhea x 3 weeks. Gallbladder removed in 2016.)   History of Present Illness:  Abigail [name] is a 67 y.o. very pleasant female patient who presents with the following:  History of HTN, well controlled DM, CKD Lab Results  Component Value Date   HGBA1C 6.5 02/01/2018   I last saw her in the office back in October, after she was admitted overnight for CP.  Thankfully all checked out ok with her heart We also had to stop her metformin due to her GFR being too low.   She is now on Victoza 1.8 daily- she will take 1.2 mg some times as her glucose may run low We will check A1c and may decrease her dose permanently   If renal function is not improved from recent previous labs will need to consider referral to nephrology at this point   She notes stomach problems for 2-3 weeks Can be worse after eating- she has to avoid greasy foods, but can occur even if she did not eat much or eats a non fatty meal Will even have pain at night She had some zofran left over and tried some- did not really help She has noted nausea but no vomiting She has noted runny stools- occasionally firm but mostly "yellow, slimy" No fever noted No blood in her stools  She had a chole in 2016- these sx are similar to what she experienced prior to having her gallbladder out  She does take trazodone in the evening at bedtime and this does help her She feels depressed due to all the pressures that are on her  She takes care of her husband [name] who has a lot of health problems, and also her mom.   She may feel overwhelmed and sat at time.  Does not have any SI, but admits that she might like to run away sometimes!    BP Readings from Last 3 Encounters:  02/01/18 140/82  11/22/17 104/68  08/30/17 132/72   Wt Readings from Last 3 Encounters:  02/01/18 142 lb 12.8 oz (64.8 kg)  11/22/17 138 lb 4 oz (62.7 kg)  08/30/17 136 lb 12.8 oz (62.1 kg)     Patient Active Problem List   Diagnosis Date Noted  . Essential hypertension   . Gastroesophageal reflux disease without esophagitis   . Microscopic hematuria 09/16/2015  . Trimalleolar fracture of ankle, closed 11/17/2014  . Other and unspecified hyperlipidemia 07/24/2013  . Chest pain 10/23/2012  . Diabetes mellitus ([place]) 10/08/2012  . Anxiety 10/08/2012    Past Medical History:  Diagnosis Date  . Anemia, unspecified   . Anxiety   . CKD (chronic kidney disease), stage III ([place])   . Depression   . Diabetes mellitus    Type 2  . GERD (gastroesophageal reflux disease)   . Hematuria   . Hyperlipidemia   . Hypertension   . Mild mitral regurgitation   . Normal coronary arteries    a. By cardiac CT 08/2017.    Past Surgical History:  Procedure Laterality Date  . CHOLECYSTECTOMY N/A 06/03/2015   Procedure: LAPAROSCOPIC CHOLECYSTECTOMY WITH INTRAOPERATIVE CHOLANGIOGRAM;  Surgeon: Donnie Mesa, MD;  Location: [facility] OR;  Service: General;  Laterality: N/A;  . COLONOSCOPY    . FRACTURE SURGERY    . HAMMER TOE SURGERY    . ORIF ANKLE FRACTURE Left 11/17/2014   Procedure: OPEN REDUCTION INTERNAL FIXATION (ORIF) LEFT TRIMALLEOLAR ANKLE FRACTURE;  Surgeon: Marianna Payment, MD;  Location: [facility];  Service: Orthopedics;  Laterality: Left;  . OVARIAN CYST REMOVAL      Social History   Tobacco Use  . Smoking status: Never Smoker  . Smokeless tobacco: Never Used  Substance Use Topics  . Alcohol use: Yes    Alcohol/week: 0.6 oz    Types: 1 Glasses of wine per week  . Drug use: No    Family History  Problem Relation Age of Onset  . Diabetes Mother   . Hyperlipidemia Mother   . Diabetes Father   . Heart disease Father 64       AMI x 2;  first AMI age 34  . Stroke Father   . Heart disease Brother        defibrillator; CHF  . Hyperlipidemia Brother     Allergies  Allergen Reactions  . Pneumovax 23 [Pneumococcal Vac Polyvalent] Other (See Comments)    Pt had severe arm pain  . Amaryl [Glimepiride] Nausea Only  . Cephalexin Hives  . Sulfa Antibiotics Rash    Medication list has been reviewed and updated.  Current Outpatient Medications on File Prior to Visit  Medication Sig Dispense Refill  . acetaminophen (TYLENOL) 325 MG tablet Take 325-650 mg by mouth every 6 (six) hours as needed (for pain).    [name] aspirin EC 81 MG EC tablet Take 1 tablet (81 mg total) by mouth daily.    [name] atorvastatin (LIPITOR) 20 MG tablet Take 1 tablet (20 mg total) by mouth daily. 90 tablet 3  . ferrous sulfate 325 (65 FE) MG tablet Take 1 tablet (325 mg total) by mouth 2 (two) times daily with a meal. 60 tablet 3  . FLUoxetine (PROZAC) 20 MG capsule Take 20 or 40 mg daily for depression (Patient taking differently: Take 20 mg by mouth See admin instructions. ONE TO TWO TIMES A DAY) 180 capsule 3  . glucose blood (ONETOUCH VERIO) test strip TEST BLOOD SUGARS TWICE DAILY. 200 each 2  . liraglutide (VICTOZA) 18 MG/3ML SOPN INJECT 1.8 MG INTO THE SKIN DAILY 18 mL 3  . losartan (COZAAR) 50 MG tablet Take 1 tablet (50 mg total) by mouth daily. 30 tablet 0  . Multiple Vitamins-Minerals (ALIVE WOMENS GUMMY) CHEW Chew 2 tablets by mouth daily.    . nitroGLYCERIN (NITROSTAT) 0.4 MG SL tablet PLACE 1 TABLET UNDER THE TONGUE EVERY 5 MINUTES AS NEEDED FOR CHEST PAIN 25 tablet 0  . omeprazole (PRILOSEC) 20 MG capsule Take 1 capsule (20 mg total) by mouth daily. 90 capsule 3  . ondansetron (ZOFRAN) 4 MG tablet Take 1 tablet (4 mg total) by mouth every 8 (eight) hours as needed for nausea or vomiting. 20 tablet 0  . propranolol (INDERAL) 10 MG tablet TAKE 1 TABLET BY MOUTH EVERY 8 TO 12 HOURS AS NEEDED FOR TREMOR 60 tablet 3  . traZODone (DESYREL) 150 MG tablet  Take 1 tablet (150 mg total) by mouth at bedtime. 90 tablet 3   No current facility-administered medications on file prior to visit.     Review of Systems:  As per HPI- otherwise negative.   Physical Examination: Vitals:   02/01/18 1009  BP: 140/82  Pulse: 89  Temp: 98.2 F (36.8 C)  SpO2: 97%   Vitals:   02/01/18 1009  Weight: 142 lb 12.8 oz (64.8 kg)  Height: 5\' 5"  (1.651 m)   Body mass index is 23.76 kg/m. Ideal Body Weight: Weight in (lb) to have BMI = 25: 149.9  GEN: WDWN, NAD, Non-toxic, A & O x 3, looks well, normal weight HEENT: Atraumatic, Normocephalic. Neck supple. No masses, No LAD.  Bilateral TM wnl, oropharynx normal.  PEERL,EOMI.   Ears and Nose: No external deformity. CV: RRR, No M/G/R. No JVD. No thrill. No extra heart sounds. PULM: CTA B, no wheezes, crackles, rhonchi. No retractions. No resp. distress. No accessory muscle use. ABD: S, ND, +BS. No rebound. No HSM.  She exhibits minimal epigastric and RUQ tenderness on exam today EXTR: No c/c/e NEURO Normal gait.  PSYCH: Normally interactive. Conversant. Not depressed or anxious appearing.  Calm demeanor.    Assessment and Plan: Right upper quadrant abdominal tenderness without rebound tenderness - Plan: CBC, Comprehensive metabolic panel, Lipase, Amylase  Diabetes mellitus type 2 in nonobese (HCC) - Plan: Hemoglobin A1c  Elevated serum creatinine - Plan: Comprehensive metabolic panel  Adjustment reaction with anxiety and depression - Plan: venlafaxine XR (EFFEXOR-XR) 37.5 MG 24 hr capsule  Here today with a couple of concerns She has noted belly pain- epigastric and RUQ- for a few weeks S/p chole Will obtain labs today and if all normal plan for a CT scan Will also plan to try effexor for depression. However, also noted prozac on her med list.  [name] is not really sure if she is taking this. She will check and let me know.  In any case it does not seem to be helping her so likely will change to  effexor anyway  Signed Lamar Blinks, MD  Spoke with her on 3/29- labs are ok, benign.  Slight elevation of lipase is non- specific Will order a CT scan for this weekend She is taking prozac 20 mg - however does not seem to be helping control her depression.  She will DC this med and start effexor after a 2 day break.    Also sent mychart message to pt: As we discussed on the phone, your labs look overall reassuring Slight increase in your lipase (pancreatic enzyme) is non- specific, but a CT will give [place] a look at your pancreas.   Your renal function is not worse, but has not come back to your previous baseline from a year ago.  We will continue to watch this closely and may have you see nephrology at some point  Your A1c shows very good control- you can go ahead and decrease your Victoza back to 1.2mg  per day  I ordered the CT today and asked for it to be done tomorrow.  If you don't hear about this please reply or call me  Results for orders placed or performed in visit on 02/01/18  CBC  Result Value Ref Range   WBC 6.2 4.0 - 10.5 K/uL   RBC 4.26 3.87 - 5.11 Mil/uL   Platelets 209.0 150.0 - 400.0 K/uL   Hemoglobin 12.0 12.0 - 15.0 g/dL   HCT 36.4 36.0 - 46.0 %   MCV 85.3 78.0 - 100.0 fl   MCHC 32.9 30.0 - 36.0 g/dL   RDW 13.6 11.5 - 15.5 %  Comprehensive metabolic panel  Result Value Ref Range   Sodium 140 135 - 145 mEq/L   Potassium 4.4 3.5 - 5.1 mEq/L   Chloride  102 96 - 112 mEq/L   CO2 30 19 - 32 mEq/L   Glucose, Bld 116 (H) 70 - 99 mg/dL   BUN 23 6 - 23 mg/dL   Creatinine, Ser 1.31 (H) 0.40 - 1.20 mg/dL   Total Bilirubin 0.3 0.2 - 1.2 mg/dL   Alkaline Phosphatase 66 39 - 117 U/L   AST 12 0 - 37 U/L   ALT 10 0 - 35 U/L   Total Protein 6.8 6.0 - 8.3 g/dL   Albumin 3.9 3.5 - 5.2 g/dL   Calcium 9.0 8.4 - 10.5 mg/dL   GFR 42.86 (L) >60.00 mL/min  Hemoglobin A1c  Result Value Ref Range   Hgb A1c MFr Bld 6.5 4.6 - 6.5 %  Lipase  Result Value Ref Range   Lipase  63.0 (H) 11.0 - 59.0 U/L  Amylase  Result Value Ref Range   Amylase 50 27 - 131 U/L

## 2018-02-01 ENCOUNTER — Ambulatory Visit (INDEPENDENT_AMBULATORY_CARE_PROVIDER_SITE_OTHER): Payer: Medicare Other | Admitting: Family Medicine

## 2018-02-01 ENCOUNTER — Encounter: Payer: Self-pay | Admitting: Family Medicine

## 2018-02-01 VITALS — BP 140/82 | HR 89 | Temp 98.2°F | Ht 65.0 in | Wt 142.8 lb

## 2018-02-01 DIAGNOSIS — F4323 Adjustment disorder with mixed anxiety and depressed mood: Secondary | ICD-10-CM | POA: Diagnosis not present

## 2018-02-01 DIAGNOSIS — R10811 Right upper quadrant abdominal tenderness: Secondary | ICD-10-CM | POA: Diagnosis not present

## 2018-02-01 DIAGNOSIS — R7989 Other specified abnormal findings of blood chemistry: Secondary | ICD-10-CM | POA: Diagnosis not present

## 2018-02-01 DIAGNOSIS — E119 Type 2 diabetes mellitus without complications: Secondary | ICD-10-CM

## 2018-02-01 LAB — CBC
HEMATOCRIT: 36.4 % (ref 36.0–46.0)
HEMOGLOBIN: 12 g/dL (ref 12.0–15.0)
MCHC: 32.9 g/dL (ref 30.0–36.0)
MCV: 85.3 fl (ref 78.0–100.0)
Platelets: 209 10*3/uL (ref 150.0–400.0)
RBC: 4.26 Mil/uL (ref 3.87–5.11)
RDW: 13.6 % (ref 11.5–15.5)
WBC: 6.2 10*3/uL (ref 4.0–10.5)

## 2018-02-01 LAB — COMPREHENSIVE METABOLIC PANEL
ALK PHOS: 66 U/L (ref 39–117)
ALT: 10 U/L (ref 0–35)
AST: 12 U/L (ref 0–37)
Albumin: 3.9 g/dL (ref 3.5–5.2)
BUN: 23 mg/dL (ref 6–23)
CO2: 30 mEq/L (ref 19–32)
Calcium: 9 mg/dL (ref 8.4–10.5)
Chloride: 102 mEq/L (ref 96–112)
Creatinine, Ser: 1.31 mg/dL — ABNORMAL HIGH (ref 0.40–1.20)
GFR: 42.86 mL/min — AB (ref >60.00)
GLUCOSE: 116 mg/dL — AB (ref 70–99)
POTASSIUM: 4.4 meq/L (ref 3.5–5.1)
Sodium: 140 mEq/L (ref 135–145)
TOTAL PROTEIN: 6.8 g/dL (ref 6.0–8.3)
Total Bilirubin: 0.3 mg/dL (ref 0.2–1.2)

## 2018-02-01 LAB — HEMOGLOBIN A1C: Hgb A1c MFr Bld: 6.5 % (ref 4.6–6.5)

## 2018-02-01 LAB — AMYLASE: AMYLASE: 50 U/L (ref 27–131)

## 2018-02-01 LAB — LIPASE: LIPASE: 63 U/L — AB (ref 11.0–59.0)

## 2018-02-01 MED ORDER — VENLAFAXINE HCL ER 37.5 MG PO CP24: 37.5000 mg | ORAL_CAPSULE | Freq: Every day | ORAL | 6 refills | Status: DC

## 2018-02-01 NOTE — Patient Instructions (Signed)
Great to see you as always! I will be in touch with your labs asap If your A1c continues to be low we may want to decrease your victoza dose Otherwise when I get your labs in we will discuss the next step in evaluating your abdominal pain- likely a CT scan. If you have any significant worsening please alert me or otherwise seek emergency care!  For depression, let's add effexor xr once a day.

## 2018-02-02 ENCOUNTER — Encounter: Payer: Self-pay | Admitting: Family Medicine

## 2018-02-03 NOTE — Telephone Encounter (Signed)
Called her- CT scheduled for 4/4.  I had wanted it done today.  Called [facility] but we not have insurance approval so no guarantee of payment.  Called pt back, her sx are stable and non- acute.  Plan to try and get scan done on Monday.  However if worsening or not ok over the weekend she will call me or go to the ER

## 2018-02-05 ENCOUNTER — Ambulatory Visit
Admission: RE | Admit: 2018-02-05 | Discharge: 2018-02-05 | Disposition: A | Discharge status: Home / Self Care | Payer: Medicare Other | Source: Ambulatory Visit | Attending: Family Medicine | Admitting: Family Medicine

## 2018-02-05 DIAGNOSIS — R10811 Right upper quadrant abdominal tenderness: Secondary | ICD-10-CM

## 2018-02-05 DIAGNOSIS — R1011 Right upper quadrant pain: Secondary | ICD-10-CM | POA: Diagnosis not present

## 2018-02-05 MED ORDER — IOPAMIDOL (ISOVUE-300) INJECTION 61%
80.0000 mL | Freq: Once | INTRAVENOUS | Status: AC | PRN
  Administered 2018-02-05: 80 mL via INTRAVENOUS

## 2018-02-06 NOTE — Telephone Encounter (Signed)
Called pt to go over her CT- is is benign.  She would like to give her sx another week or two to resolve and will let me know if they do not go away,  In that case will plan to refer back to GI

## 2018-02-08 ENCOUNTER — Other Ambulatory Visit: Payer: Medicare Other

## 2018-02-20 DIAGNOSIS — L821 Other seborrheic keratosis: Secondary | ICD-10-CM | POA: Diagnosis not present

## 2018-02-20 DIAGNOSIS — L570 Actinic keratosis: Secondary | ICD-10-CM | POA: Diagnosis not present

## 2018-04-19 DIAGNOSIS — Z01419 Encounter for gynecological examination (general) (routine) without abnormal findings: Secondary | ICD-10-CM | POA: Diagnosis not present

## 2018-04-19 DIAGNOSIS — N958 Other specified menopausal and perimenopausal disorders: Secondary | ICD-10-CM | POA: Diagnosis not present

## 2018-04-19 DIAGNOSIS — Z6824 Body mass index (BMI) 24.0-24.9, adult: Secondary | ICD-10-CM | POA: Diagnosis not present

## 2018-04-19 DIAGNOSIS — Z1231 Encounter for screening mammogram for malignant neoplasm of breast: Secondary | ICD-10-CM | POA: Diagnosis not present

## 2018-05-31 ENCOUNTER — Telehealth: Payer: Self-pay

## 2018-05-31 ENCOUNTER — Encounter: Payer: Self-pay | Admitting: Family Medicine

## 2018-05-31 DIAGNOSIS — E119 Type 2 diabetes mellitus without complications: Secondary | ICD-10-CM

## 2018-05-31 MED ORDER — LIRAGLUTIDE 18 MG/3ML ~~LOC~~ SOPN: PEN_INJECTOR | SUBCUTANEOUS | 3 refills | Status: DC

## 2018-05-31 NOTE — Telephone Encounter (Signed)
Victoza sent to [facility] instead per husband's request via [place]. Author phoned [facility] and cancelled order sent to them originally.

## 2018-06-19 ENCOUNTER — Encounter: Payer: Self-pay | Admitting: Family Medicine

## 2018-06-19 DIAGNOSIS — D126 Benign neoplasm of colon, unspecified: Secondary | ICD-10-CM | POA: Diagnosis not present

## 2018-06-19 DIAGNOSIS — K5730 Diverticulosis of large intestine without perforation or abscess without bleeding: Secondary | ICD-10-CM | POA: Diagnosis not present

## 2018-06-19 DIAGNOSIS — Z86010 Personal history of colonic polyps: Secondary | ICD-10-CM | POA: Diagnosis not present

## 2018-06-22 DIAGNOSIS — D126 Benign neoplasm of colon, unspecified: Secondary | ICD-10-CM | POA: Diagnosis not present

## 2018-07-07 ENCOUNTER — Other Ambulatory Visit: Payer: Self-pay | Admitting: Family Medicine

## 2018-07-07 DIAGNOSIS — G250 Essential tremor: Secondary | ICD-10-CM

## 2018-07-16 ENCOUNTER — Other Ambulatory Visit: Payer: Self-pay

## 2018-08-14 DIAGNOSIS — J069 Acute upper respiratory infection, unspecified: Secondary | ICD-10-CM | POA: Diagnosis not present

## 2018-08-14 DIAGNOSIS — J029 Acute pharyngitis, unspecified: Secondary | ICD-10-CM | POA: Diagnosis not present

## 2018-09-06 ENCOUNTER — Other Ambulatory Visit: Payer: Self-pay | Admitting: Family Medicine

## 2018-09-06 DIAGNOSIS — I10 Essential (primary) hypertension: Secondary | ICD-10-CM

## 2018-09-25 ENCOUNTER — Other Ambulatory Visit: Payer: Self-pay

## 2018-10-07 ENCOUNTER — Other Ambulatory Visit: Payer: Self-pay | Admitting: Family Medicine

## 2018-10-07 DIAGNOSIS — F329 Major depressive disorder, single episode, unspecified: Secondary | ICD-10-CM

## 2018-10-17 ENCOUNTER — Other Ambulatory Visit: Payer: Self-pay | Admitting: Family Medicine

## 2018-10-17 DIAGNOSIS — G250 Essential tremor: Secondary | ICD-10-CM

## 2018-10-18 ENCOUNTER — Other Ambulatory Visit: Payer: Self-pay | Admitting: Family Medicine

## 2018-10-18 DIAGNOSIS — G250 Essential tremor: Secondary | ICD-10-CM

## 2018-10-19 ENCOUNTER — Other Ambulatory Visit: Payer: Self-pay | Admitting: Family Medicine

## 2018-10-19 DIAGNOSIS — F329 Major depressive disorder, single episode, unspecified: Secondary | ICD-10-CM

## 2018-10-19 MED ORDER — TRAZODONE HCL 150 MG PO TABS: 150.0000 mg | ORAL_TABLET | Freq: Every day | ORAL | 3 refills | Status: DC

## 2018-10-19 NOTE — Telephone Encounter (Signed)
Copied from [place] [phone]. Topic: Quick Communication - Rx Refill/Question >> Oct 19, 2018  2:31 PM Reyne Dumas L wrote: Medication: traZODone (DESYREL) 150 MG tablet  Has the patient contacted their pharmacy? Yes - states they have heard nothing - requested December 4th (Agent: If no, request that the patient contact the pharmacy for the refill.) (Agent: If yes, when and what did the pharmacy advise?)  Preferred Pharmacy (with phone number or street name): [facility] [place], [address] AT [address] [phone] (Phone) [phone] (Fax)  Agent: Please be advised that RX refills may take up to 3 business days. We ask that you follow-up with your pharmacy.

## 2018-10-31 ENCOUNTER — Other Ambulatory Visit: Payer: Self-pay | Admitting: Family Medicine

## 2018-10-31 DIAGNOSIS — K219 Gastro-esophageal reflux disease without esophagitis: Secondary | ICD-10-CM

## 2018-10-31 DIAGNOSIS — E785 Hyperlipidemia, unspecified: Secondary | ICD-10-CM

## 2018-12-09 ENCOUNTER — Other Ambulatory Visit: Payer: Self-pay | Admitting: Family Medicine

## 2018-12-09 DIAGNOSIS — I10 Essential (primary) hypertension: Secondary | ICD-10-CM

## 2018-12-13 ENCOUNTER — Telehealth: Payer: Self-pay | Admitting: Family Medicine

## 2018-12-13 MED ORDER — OSELTAMIVIR PHOSPHATE 75 MG PO CAPS: 75.0000 mg | ORAL_CAPSULE | Freq: Every day | ORAL | 0 refills | Status: DC

## 2018-12-13 NOTE — Telephone Encounter (Signed)
Pt called- her husband has the flu She requests tamiflu rx sent in for her

## 2018-12-25 DIAGNOSIS — C44622 Squamous cell carcinoma of skin of right upper limb, including shoulder: Secondary | ICD-10-CM | POA: Diagnosis not present

## 2018-12-25 DIAGNOSIS — L821 Other seborrheic keratosis: Secondary | ICD-10-CM | POA: Diagnosis not present

## 2018-12-25 DIAGNOSIS — D0472 Carcinoma in situ of skin of left lower limb, including hip: Secondary | ICD-10-CM | POA: Diagnosis not present

## 2018-12-25 DIAGNOSIS — L570 Actinic keratosis: Secondary | ICD-10-CM | POA: Diagnosis not present

## 2018-12-25 DIAGNOSIS — D0471 Carcinoma in situ of skin of right lower limb, including hip: Secondary | ICD-10-CM | POA: Diagnosis not present

## 2019-01-31 ENCOUNTER — Other Ambulatory Visit: Payer: Self-pay | Admitting: Family Medicine

## 2019-01-31 DIAGNOSIS — E785 Hyperlipidemia, unspecified: Secondary | ICD-10-CM

## 2019-01-31 DIAGNOSIS — K219 Gastro-esophageal reflux disease without esophagitis: Secondary | ICD-10-CM

## 2019-03-10 ENCOUNTER — Other Ambulatory Visit: Payer: Self-pay | Admitting: Family Medicine

## 2019-03-10 DIAGNOSIS — I10 Essential (primary) hypertension: Secondary | ICD-10-CM

## 2019-03-11 ENCOUNTER — Other Ambulatory Visit: Payer: Self-pay | Admitting: Family Medicine

## 2019-03-11 DIAGNOSIS — I10 Essential (primary) hypertension: Secondary | ICD-10-CM

## 2019-03-12 ENCOUNTER — Encounter: Payer: Self-pay | Admitting: *Deleted

## 2019-04-08 ENCOUNTER — Other Ambulatory Visit: Payer: Self-pay | Admitting: Family Medicine

## 2019-04-08 DIAGNOSIS — I10 Essential (primary) hypertension: Secondary | ICD-10-CM

## 2019-04-09 ENCOUNTER — Other Ambulatory Visit: Payer: Self-pay | Admitting: Family Medicine

## 2019-04-09 DIAGNOSIS — I10 Essential (primary) hypertension: Secondary | ICD-10-CM

## 2019-05-10 ENCOUNTER — Other Ambulatory Visit: Payer: Self-pay | Admitting: Family Medicine

## 2019-05-10 DIAGNOSIS — I10 Essential (primary) hypertension: Secondary | ICD-10-CM

## 2019-05-13 ENCOUNTER — Other Ambulatory Visit: Payer: Self-pay | Admitting: Family Medicine

## 2019-05-13 ENCOUNTER — Telehealth: Payer: Self-pay

## 2019-05-13 DIAGNOSIS — I10 Essential (primary) hypertension: Secondary | ICD-10-CM

## 2019-05-13 NOTE — Telephone Encounter (Signed)
Copied from [place] [phone]. Topic: General - Other >> May 09, 2019  3:06 PM Keene Breath wrote: Reason for CRM: Patient stated she is returning a call to the office.  She said she would be at this number for another hour.  CB# [phone].  After that, she can be reached tomorrow.

## 2019-05-17 ENCOUNTER — Other Ambulatory Visit: Payer: Self-pay | Admitting: Family Medicine

## 2019-05-17 DIAGNOSIS — G250 Essential tremor: Secondary | ICD-10-CM

## 2019-06-01 NOTE — Progress Notes (Signed)
[facility] [place], [place], [place] [address] [phone] [id number]  Date:  06/03/2019   Name:  Abigail [name]   DOB:  January 08, 1951   MRN:  [id number]  PCP:  Darreld Mclean, MD    Chief Complaint: Annual Exam   History of Present Illness:  Abigail [name] is a 68 y.o. very pleasant female patient who presents with the following:  Here today for CPE Last visit with myself 3/19 although I have seen her when she came in with her husband Abigail [name]  History of HTN, GERD, DM, serious ankle fracture in 2016, microhematuria s/p urology eval in 2016  Foot exam due Mammo:  Will order for med center  Eye exam: this week  Labs are due Colon due in 2024 Due for dexa scan; will order  IUTD but suggest shingrix  Will do pap for her   Her husband Abigail [name] has been really struggling recently with his health He was recently changed over to nightly peritoneal dialysis.  This is easier on him but harder for Abigail [name] He has needed a lot of care and extra help -he is irritable and often angry, he is very talkative and needs a lot from her  Her mother also lives with them- she has poor vision  She is working a couple of days a week at [employer] for 4 hours at time- this is really her only break   She is not walking for exercise as much recently due to being so much with Abigail [name]   lipitor 20 daily  Propranolol 20 mg daily Losartan total of 75 daily victoza daily She is not on metformin due to her kidneys   Abigail [name] admits that she is feeling depressed and overwhelmed due to stressors at home  She is also taking both prozac 20 and venlafaxine 75 per her GYN- I was not aware that she was taking both of these.  Venlafaxine is from her GYN.  We discussed this regimen- would like to have her on just one of these meds esp as she is also taking trazodone Will stop prozac and go up on her venlafaxine from 75 to 150 Wt Readings from Last 3  Encounters:  06/03/19 151 lb (68.5 kg)  02/01/18 142 lb 12.8 oz (64.8 kg)  11/22/17 138 lb 4 oz (62.7 kg)     Patient Active Problem List   Diagnosis Date Noted  . Essential hypertension   . Gastroesophageal reflux disease without esophagitis   . Microscopic hematuria 09/16/2015  . Trimalleolar fracture of ankle, closed 11/17/2014  . Other and unspecified hyperlipidemia 07/24/2013  . Chest pain 10/23/2012  . Diabetes mellitus ([facility]) 10/08/2012  . Anxiety 10/08/2012    Past Medical History:  Diagnosis Date  . Anemia, unspecified   . Anxiety   . CKD (chronic kidney disease), stage III ([facility])   . Depression   . Diabetes mellitus    Type 2  . GERD (gastroesophageal reflux disease)   . Hematuria   . Hyperlipidemia   . Hypertension   . Mild mitral regurgitation   . Normal coronary arteries    a. By cardiac CT 08/2017.    Past Surgical History:  Procedure Laterality Date  . CHOLECYSTECTOMY N/A 06/03/2015   Procedure: LAPAROSCOPIC CHOLECYSTECTOMY WITH INTRAOPERATIVE CHOLANGIOGRAM;  Surgeon: Donnie Mesa, MD;  Location: [facility];  Service: General;  Laterality: N/A;  . COLONOSCOPY    . FRACTURE SURGERY    .  HAMMER TOE SURGERY    . ORIF ANKLE FRACTURE Left 11/17/2014   Procedure: OPEN REDUCTION INTERNAL FIXATION (ORIF) LEFT TRIMALLEOLAR ANKLE FRACTURE;  Surgeon: Marianna Payment, MD;  Location: [facility];  Service: Orthopedics;  Laterality: Left;  . OVARIAN CYST REMOVAL      Social History   Tobacco Use  . Smoking status: Never Smoker  . Smokeless tobacco: Never Used  Substance Use Topics  . Alcohol use: Yes    Alcohol/week: 1.0 standard drinks    Types: 1 Glasses of wine per week  . Drug use: No    Family History  Problem Relation Age of Onset  . Diabetes Mother   . Hyperlipidemia Mother   . Diabetes Father   . Heart disease Father 42       AMI x 2; first AMI age 24  . Stroke Father   . Heart disease Brother        defibrillator; CHF  . Hyperlipidemia Brother      Allergies  Allergen Reactions  . Pneumovax 23 [Pneumococcal Vac Polyvalent] Other (See Comments)    Pt had severe arm pain  . Amaryl [Glimepiride] Nausea Only  . Cephalexin Hives  . Sulfa Antibiotics Rash    Medication list has been reviewed and updated.  Current Outpatient Medications on File Prior to Visit  Medication Sig Dispense Refill  . atorvastatin (LIPITOR) 20 MG tablet TAKE 1 TABLET(20 MG) BY MOUTH DAILY 90 tablet 1  . glucose blood (ONETOUCH VERIO) test strip TEST BLOOD SUGARS TWICE DAILY. 200 each 2  . liraglutide (VICTOZA) 18 MG/3ML SOPN INJECT 1.8 MG INTO THE SKIN DAILY 18 mL 3  . losartan (COZAAR) 25 MG tablet TAKE 1 TABLET(25 MG) BY MOUTH DAILY 90 tablet 0  . losartan (COZAAR) 50 MG tablet Take 1 tablet (50 mg total) by mouth daily. 30 tablet 0  . Multiple Vitamins-Minerals (ALIVE WOMENS GUMMY) CHEW Chew 2 tablets by mouth daily.    . propranolol (INDERAL) 10 MG tablet TAKE 1 TABLET BY MOUTH EVERY 8 TO 12 HOURS AS NEEDED FOR TREMOR. 270 tablet 0  . traZODone (DESYREL) 150 MG tablet Take 1 tablet (150 mg total) by mouth at bedtime. 90 tablet 3  . nitroGLYCERIN (NITROSTAT) 0.4 MG SL tablet PLACE 1 TABLET UNDER THE TONGUE EVERY 5 MINUTES AS NEEDED FOR CHEST PAIN (Patient not taking: Reported on 06/03/2019) 25 tablet 0   No current facility-administered medications on file prior to visit.     Review of Systems:  As per HPI- otherwise negative. No fever or chills   Physical Examination: Vitals:   06/03/19 0926  BP: 102/80  Pulse: 75  Resp: 16  Temp: 97.9 F (36.6 C)  SpO2: 98%   Vitals:   06/03/19 0926  Weight: 151 lb (68.5 kg)  Height: 5\' 5"  (1.651 m)   Body mass index is 25.13 kg/m. Ideal Body Weight: Weight in (lb) to have BMI = 25: 149.9  GEN: WDWN, NAD, Non-toxic, A & O x 3, normal weight, looks well  HEENT: Atraumatic, Normocephalic. Neck supple. No masses, No LAD. Ears and Nose: No external deformity. CV: RRR, No M/G/R. No JVD. No thrill. No  extra heart sounds. PULM: CTA B, no wheezes, crackles, rhonchi. No retractions. No resp. distress. No accessory muscle use. ABD: S, NT, ND, +BS. No rebound. No HSM. EXTR: No c/c/e NEURO Normal gait.  PSYCH: Normally interactive. Conversant. Not depressed or anxious appearing.  Calm demeanor.  Pelvic: normal vulva except for dilated blood vessels  on right labia majora which are longstanding per pt , no vaginal lesions or discharge. Uterus normal, no CMT, no adnexal tendereness or masses   Assessment and Plan:   ICD-10-CM   1. Physical exam  Z00.00   2. Screening for breast cancer  Z12.39 MM 3D SCREEN BREAST BILATERAL  3. Estrogen deficiency  E28.39 DG Bone Density  4. Diabetes mellitus type 2 in nonobese (HCC)  E11.9 Comprehensive metabolic panel    Hemoglobin A1c    Microalbumin / creatinine urine ratio  5. Benign essential HTN  I10 CBC    Comprehensive metabolic panel  6. Screening for hyperlipidemia  Z13.220 Lipid panel  7. Screening for cervical cancer  Z12.4 Cytology - PAP  8. Adjustment reaction with anxiety and depression  F43.23 venlafaxine XR (EFFEXOR-XR) 150 MG 24 hr capsule  9. Stress at home  F43.9    Physical exam today Pap, other screening as above Ordered mammo and dexa BP under ok control  Discussed resources to help her manage her ill family members- encouraged her to get help as she cannot do it all herself DC prozac  Go up on effexor to 150 Will plan further follow- up pending labs.   Follow-up: No follow-ups on file.  Meds ordered this encounter  Medications  . venlafaxine XR (EFFEXOR-XR) 150 MG 24 hr capsule    Sig: Take 1 capsule (150 mg total) by mouth daily with breakfast.    Dispense:  30 capsule    Refill:  11   Orders Placed This Encounter  Procedures  . MM 3D SCREEN BREAST BILATERAL  . DG Bone Density  . CBC  . Comprehensive metabolic panel  . Hemoglobin A1c  . Lipid panel  . Microalbumin / creatinine urine ratio     @SIGN @    Signed Lamar Blinks, MD  Received her labs as below, message to pt   A1c has gone up one point  Results for orders placed or performed in visit on 06/03/19  CBC  Result Value Ref Range   WBC 7.7 4.0 - 10.5 K/uL   RBC 4.73 3.87 - 5.11 Mil/uL   Platelets 232.0 150.0 - 400.0 K/uL   Hemoglobin 13.2 12.0 - 15.0 g/dL   HCT 40.2 36.0 - 46.0 %   MCV 85.0 78.0 - 100.0 fl   MCHC 32.9 30.0 - 36.0 g/dL   RDW 13.6 11.5 - 15.5 %  Comprehensive metabolic panel  Result Value Ref Range   Sodium 140 135 - 145 mEq/L   Potassium 4.0 3.5 - 5.1 mEq/L   Chloride 98 96 - 112 mEq/L   CO2 30 19 - 32 mEq/L   Glucose, Bld 198 (H) 70 - 99 mg/dL   BUN 27 (H) 6 - 23 mg/dL   Creatinine, Ser 1.96 (H) 0.40 - 1.20 mg/dL   Total Bilirubin 0.4 0.2 - 1.2 mg/dL   Alkaline Phosphatase 107 39 - 117 U/L   AST 13 0 - 37 U/L   ALT 10 0 - 35 U/L   Total Protein 7.0 6.0 - 8.3 g/dL   Albumin 4.5 3.5 - 5.2 g/dL   Calcium 8.9 8.4 - 10.5 mg/dL   GFR 25.23 (L) >60.00 mL/min  Hemoglobin A1c  Result Value Ref Range   Hgb A1c MFr Bld 7.5 (H) 4.6 - 6.5 %  Lipid panel  Result Value Ref Range   Cholesterol 179 0 - 200 mg/dL   Triglycerides 157.0 (H) 0.0 - 149.0 mg/dL   HDL 53.30 >39.00 mg/dL  VLDL 31.4 0.0 - 40.0 mg/dL   LDL Cholesterol 94 0 - 99 mg/dL   Total CHOL/HDL Ratio 3    NonHDL 125.37    Blood counts are normal Your kidney function has dropped significantly since we last checked in 2019.  I am not sure if this is a fluke or something real.  It may also be just that you were dehydrated.  I am going to order a repeat kidney function- please schedule a lab visit with me in 1-2 weeks and we will recheck this.   Also, please go ahead and stop prozac but hold off on increasing Effexor until we figure this out  A1c has gone up some- not an emergency.  Please work on your diet and we can recheck in 3 months Cholesterol looks good - continue lipitor  Let me know if any questions!

## 2019-06-03 ENCOUNTER — Other Ambulatory Visit (HOSPITAL_COMMUNITY)
Admission: RE | Admit: 2019-06-03 | Discharge: 2019-06-03 | Disposition: A | Discharge status: Home / Self Care | Payer: Medicare Other | Source: Ambulatory Visit | Attending: Family Medicine | Admitting: Family Medicine

## 2019-06-03 ENCOUNTER — Ambulatory Visit (INDEPENDENT_AMBULATORY_CARE_PROVIDER_SITE_OTHER): Payer: Medicare Other | Admitting: Family Medicine

## 2019-06-03 ENCOUNTER — Other Ambulatory Visit: Payer: Self-pay

## 2019-06-03 ENCOUNTER — Encounter: Payer: Self-pay | Admitting: Family Medicine

## 2019-06-03 VITALS — BP 102/80 | HR 75 | Temp 97.9°F | Resp 16 | Ht 65.0 in | Wt 151.0 lb

## 2019-06-03 DIAGNOSIS — F4323 Adjustment disorder with mixed anxiety and depressed mood: Secondary | ICD-10-CM

## 2019-06-03 DIAGNOSIS — I10 Essential (primary) hypertension: Secondary | ICD-10-CM

## 2019-06-03 DIAGNOSIS — E2839 Other primary ovarian failure: Secondary | ICD-10-CM

## 2019-06-03 DIAGNOSIS — Z124 Encounter for screening for malignant neoplasm of cervix: Secondary | ICD-10-CM | POA: Diagnosis not present

## 2019-06-03 DIAGNOSIS — Z13220 Encounter for screening for lipoid disorders: Secondary | ICD-10-CM

## 2019-06-03 DIAGNOSIS — Z1239 Encounter for other screening for malignant neoplasm of breast: Secondary | ICD-10-CM

## 2019-06-03 DIAGNOSIS — Z780 Asymptomatic menopausal state: Secondary | ICD-10-CM | POA: Diagnosis not present

## 2019-06-03 DIAGNOSIS — Z0000 Encounter for general adult medical examination without abnormal findings: Secondary | ICD-10-CM

## 2019-06-03 DIAGNOSIS — Z1151 Encounter for screening for human papillomavirus (HPV): Secondary | ICD-10-CM | POA: Insufficient documentation

## 2019-06-03 DIAGNOSIS — N289 Disorder of kidney and ureter, unspecified: Secondary | ICD-10-CM

## 2019-06-03 DIAGNOSIS — E119 Type 2 diabetes mellitus without complications: Secondary | ICD-10-CM | POA: Diagnosis not present

## 2019-06-03 DIAGNOSIS — F439 Reaction to severe stress, unspecified: Secondary | ICD-10-CM

## 2019-06-03 LAB — COMPREHENSIVE METABOLIC PANEL
ALT: 10 U/L (ref 0–35)
AST: 13 U/L (ref 0–37)
Albumin: 4.5 g/dL (ref 3.5–5.2)
Alkaline Phosphatase: 107 U/L (ref 39–117)
BUN: 27 mg/dL — ABNORMAL HIGH (ref 6–23)
CO2: 30 mEq/L (ref 19–32)
Calcium: 8.9 mg/dL (ref 8.4–10.5)
Chloride: 98 mEq/L (ref 96–112)
Creatinine, Ser: 1.96 mg/dL — ABNORMAL HIGH (ref 0.40–1.20)
GFR: 25.23 mL/min — ABNORMAL LOW (ref >60.00)
Glucose, Bld: 198 mg/dL — ABNORMAL HIGH (ref 70–99)
Potassium: 4 mEq/L (ref 3.5–5.1)
Sodium: 140 mEq/L (ref 135–145)
Total Bilirubin: 0.4 mg/dL (ref 0.2–1.2)
Total Protein: 7 g/dL (ref 6.0–8.3)

## 2019-06-03 LAB — LIPID PANEL
Cholesterol: 179 mg/dL (ref 0–200)
HDL: 53.3 mg/dL (ref >39.00)
LDL Cholesterol: 94 mg/dL (ref 0–99)
NonHDL: 125.37
Total CHOL/HDL Ratio: 3
Triglycerides: 157 mg/dL — ABNORMAL HIGH (ref 0.0–149.0)
VLDL: 31.4 mg/dL (ref 0.0–40.0)

## 2019-06-03 LAB — CBC
HCT: 40.2 % (ref 36.0–46.0)
Hemoglobin: 13.2 g/dL (ref 12.0–15.0)
MCHC: 32.9 g/dL (ref 30.0–36.0)
MCV: 85 fl (ref 78.0–100.0)
Platelets: 232 10*3/uL (ref 150.0–400.0)
RBC: 4.73 Mil/uL (ref 3.87–5.11)
RDW: 13.6 % (ref 11.5–15.5)
WBC: 7.7 10*3/uL (ref 4.0–10.5)

## 2019-06-03 LAB — MICROALBUMIN / CREATININE URINE RATIO
Creatinine,U: 293.4 mg/dL
Microalb Creat Ratio: 1.1 mg/g (ref 0.0–30.0)
Microalb, Ur: 3.1 mg/dL — ABNORMAL HIGH (ref 0.0–1.9)

## 2019-06-03 LAB — HEMOGLOBIN A1C: Hgb A1c MFr Bld: 7.5 % — ABNORMAL HIGH (ref 4.6–6.5)

## 2019-06-03 MED ORDER — VENLAFAXINE HCL ER 150 MG PO CP24: 150.0000 mg | ORAL_CAPSULE | Freq: Every day | ORAL | 11 refills | Status: DC

## 2019-06-03 NOTE — Patient Instructions (Addendum)
[facility]- might be able to offer some help for your mom  [facility] [employer] Phone: [phone] Fax: [facility] [address] [place],  [address] Email: [email or web address]  You also might try [facility] for some help for [name] - I think you need more assistance at home   I will be in touch with your labs and pap I ordered a mammogram and bone density for you  For depression- STOP fluoxetine (prozac) and go up on Venlafaxine to 150 mg- I sent in a new rx for this, but you can also take 2 of the 75 until you finish this up    Health Maintenance After Age 52 After age 11, you are at a higher risk for certain long-term diseases and infections as well as injuries from falls. Falls are a major cause of broken bones and head injuries in people who are older than age 42. Getting regular preventive care can help to keep you healthy and well. Preventive care includes getting regular testing and making lifestyle changes as recommended by your health care provider. Talk with your health care provider about:  Which screenings and tests you should have. A screening is a test that checks for a disease when you have no symptoms.  A diet and exercise plan that is right for you. What should I know about screenings and tests to prevent falls? Screening and testing are the best ways to find a health problem early. Early diagnosis and treatment give you the best chance of managing medical conditions that are common after age 50. Certain conditions and lifestyle choices may make you more likely to have a fall. Your health care provider may recommend:  Regular vision checks. Poor vision and conditions such as cataracts can make you more likely to have a fall. If you wear glasses, make sure to get your prescription updated if your vision changes.  Medicine review. Work with your health care provider to regularly  review all of the medicines you are taking, including over-the-counter medicines. Ask your health care provider about any side effects that may make you more likely to have a fall. Tell your health care provider if any medicines that you take make you feel dizzy or sleepy.  Osteoporosis screening. Osteoporosis is a condition that causes the bones to get weaker. This can make the bones weak and cause them to break more easily.  Blood pressure screening. Blood pressure changes and medicines to control blood pressure can make you feel dizzy.  Strength and balance checks. Your health care provider may recommend certain tests to check your strength and balance while standing, walking, or changing positions.  Foot health exam. Foot pain and numbness, as well as not wearing proper footwear, can make you more likely to have a fall.  Depression screening. You may be more likely to have a fall if you have a fear of falling, feel emotionally low, or feel unable to do activities that you used to do.  Alcohol use screening. Using too much alcohol can affect your balance and may make you more likely to have a fall. What actions can I take to lower my risk of falls? General instructions  Talk with your health care provider about your risks for falling. Tell your health care provider if: ? You fall. Be sure to tell your health care provider about all falls, even ones that seem minor. ? You feel dizzy, sleepy, or off-balance.  Take over-the-counter  and prescription medicines only as told by your health care provider. These include any supplements.  Eat a healthy diet and maintain a healthy weight. A healthy diet includes low-fat dairy products, low-fat (lean) meats, and fiber from whole grains, beans, and lots of fruits and vegetables. Home safety  Remove any tripping hazards, such as rugs, cords, and clutter.  Install safety equipment such as grab bars in bathrooms and safety rails on stairs.  Keep  rooms and walkways well-lit. Activity   Follow a regular exercise program to stay fit. This will help you maintain your balance. Ask your health care provider what types of exercise are appropriate for you.  If you need a cane or walker, use it as recommended by your health care provider.  Wear supportive shoes that have nonskid soles. Lifestyle  Do not drink alcohol if your health care provider tells you not to drink.  If you drink alcohol, limit how much you have: ? 0-1 drink a day for women. ? 0-2 drinks a day for men.  Be aware of how much alcohol is in your drink. In the [place] one drink equals one typical bottle of beer (12 oz), one-half glass of wine (5 oz), or one shot of hard liquor (1 oz).  Do not use any products that contain nicotine or tobacco, such as cigarettes and e-cigarettes. If you need help quitting, ask your health care provider. Summary  Having a healthy lifestyle and getting preventive care can help to protect your health and wellness after age 19.  Screening and testing are the best way to find a health problem early and help you avoid having a fall. Early diagnosis and treatment give you the best chance for managing medical conditions that are more common for people who are older than age 40.  Falls are a major cause of broken bones and head injuries in people who are older than age 16. Take precautions to prevent a fall at home.  Work with your health care provider to learn what changes you can make to improve your health and wellness and to prevent falls. This information is not intended to replace advice given to you by your health care provider. Make sure you discuss any questions you have with your health care provider. Document Released: 09/06/2017 Document Revised: 02/14/2019 Document Reviewed: 09/06/2017 Elsevier Patient Education  2020 [employer].

## 2019-06-05 ENCOUNTER — Encounter: Payer: Self-pay | Admitting: Family Medicine

## 2019-06-05 DIAGNOSIS — E119 Type 2 diabetes mellitus without complications: Secondary | ICD-10-CM | POA: Diagnosis not present

## 2019-06-05 LAB — CYTOLOGY - PAP
Diagnosis: NEGATIVE
HPV: NOT DETECTED

## 2019-06-05 LAB — HM DIABETES EYE EXAM

## 2019-06-12 ENCOUNTER — Ambulatory Visit (HOSPITAL_BASED_OUTPATIENT_CLINIC_OR_DEPARTMENT_OTHER)
Admission: RE | Admit: 2019-06-12 | Discharge: 2019-06-12 | Disposition: A | Discharge status: Home / Self Care | Payer: Medicare Other | Source: Ambulatory Visit | Attending: Family Medicine | Admitting: Family Medicine

## 2019-06-12 ENCOUNTER — Other Ambulatory Visit: Payer: Self-pay

## 2019-06-12 DIAGNOSIS — Z1239 Encounter for other screening for malignant neoplasm of breast: Secondary | ICD-10-CM

## 2019-06-12 DIAGNOSIS — Z1231 Encounter for screening mammogram for malignant neoplasm of breast: Secondary | ICD-10-CM | POA: Diagnosis not present

## 2019-06-25 ENCOUNTER — Other Ambulatory Visit: Payer: Self-pay | Admitting: Family Medicine

## 2019-06-25 DIAGNOSIS — K219 Gastro-esophageal reflux disease without esophagitis: Secondary | ICD-10-CM

## 2019-06-27 DIAGNOSIS — E119 Type 2 diabetes mellitus without complications: Secondary | ICD-10-CM | POA: Diagnosis not present

## 2019-06-27 DIAGNOSIS — H25811 Combined forms of age-related cataract, right eye: Secondary | ICD-10-CM | POA: Diagnosis not present

## 2019-06-27 DIAGNOSIS — H25812 Combined forms of age-related cataract, left eye: Secondary | ICD-10-CM | POA: Diagnosis not present

## 2019-07-01 ENCOUNTER — Other Ambulatory Visit: Payer: Self-pay | Admitting: Family Medicine

## 2019-07-01 ENCOUNTER — Other Ambulatory Visit (INDEPENDENT_AMBULATORY_CARE_PROVIDER_SITE_OTHER): Payer: Medicare Other

## 2019-07-01 ENCOUNTER — Ambulatory Visit (INDEPENDENT_AMBULATORY_CARE_PROVIDER_SITE_OTHER): Payer: Medicare Other

## 2019-07-01 ENCOUNTER — Other Ambulatory Visit: Payer: Self-pay

## 2019-07-01 DIAGNOSIS — E119 Type 2 diabetes mellitus without complications: Secondary | ICD-10-CM

## 2019-07-01 DIAGNOSIS — Z23 Encounter for immunization: Secondary | ICD-10-CM | POA: Diagnosis not present

## 2019-07-01 DIAGNOSIS — N289 Disorder of kidney and ureter, unspecified: Secondary | ICD-10-CM

## 2019-07-01 MED ORDER — VICTOZA 18 MG/3ML ~~LOC~~ SOPN: PEN_INJECTOR | SUBCUTANEOUS | 3 refills | Status: DC

## 2019-07-01 NOTE — Progress Notes (Signed)
Pre visit review using our clinic review tool, if applicable. No additional management support is needed unless otherwise documented below in the visit note.  Patient is here today for flu shot. 0.41mL influenza vaccine given in patient's right deltoid IM. Patient tolerated well.

## 2019-07-02 ENCOUNTER — Other Ambulatory Visit: Payer: Self-pay

## 2019-07-02 ENCOUNTER — Encounter: Payer: Self-pay | Admitting: Family Medicine

## 2019-07-02 DIAGNOSIS — E119 Type 2 diabetes mellitus without complications: Secondary | ICD-10-CM

## 2019-07-02 LAB — BASIC METABOLIC PANEL
BUN: 20 mg/dL (ref 6–23)
CO2: 30 mEq/L (ref 19–32)
Calcium: 8.4 mg/dL (ref 8.4–10.5)
Chloride: 101 mEq/L (ref 96–112)
Creatinine, Ser: 1.43 mg/dL — ABNORMAL HIGH (ref 0.40–1.20)
GFR: 36.3 mL/min — ABNORMAL LOW (ref >60.00)
Glucose, Bld: 189 mg/dL — ABNORMAL HIGH (ref 70–99)
Potassium: 4.2 mEq/L (ref 3.5–5.1)
Sodium: 141 mEq/L (ref 135–145)

## 2019-07-02 MED ORDER — BLOOD GLUCOSE METER KIT: PACK | 0 refills | Status: AC

## 2019-07-18 DIAGNOSIS — H25812 Combined forms of age-related cataract, left eye: Secondary | ICD-10-CM | POA: Diagnosis not present

## 2019-07-18 DIAGNOSIS — H2512 Age-related nuclear cataract, left eye: Secondary | ICD-10-CM | POA: Diagnosis not present

## 2019-08-04 ENCOUNTER — Encounter: Payer: Self-pay | Admitting: Family Medicine

## 2019-08-04 ENCOUNTER — Other Ambulatory Visit: Payer: Self-pay | Admitting: Family Medicine

## 2019-08-04 MED ORDER — DOXYCYCLINE HYCLATE 100 MG PO CAPS: 100.0000 mg | ORAL_CAPSULE | Freq: Two times a day (BID) | ORAL | 0 refills | Status: DC

## 2019-08-04 NOTE — Progress Notes (Signed)
Received a message from pt that she thinks she has a sinus infection.  Her mom is in the hospital with pneumonia and her husband has ESRF. She is requesting abx.  Will rx doxycycline for her - [facility] at [facility]

## 2019-08-05 MED ORDER — DOXYCYCLINE HYCLATE 100 MG PO TABS: 100.0000 mg | ORAL_TABLET | Freq: Two times a day (BID) | ORAL | 0 refills | Status: DC

## 2019-08-08 DIAGNOSIS — H25811 Combined forms of age-related cataract, right eye: Secondary | ICD-10-CM | POA: Diagnosis not present

## 2019-08-08 DIAGNOSIS — H2511 Age-related nuclear cataract, right eye: Secondary | ICD-10-CM | POA: Diagnosis not present

## 2019-08-10 ENCOUNTER — Other Ambulatory Visit: Payer: Self-pay | Admitting: Family Medicine

## 2019-08-10 DIAGNOSIS — I10 Essential (primary) hypertension: Secondary | ICD-10-CM

## 2019-08-17 ENCOUNTER — Other Ambulatory Visit: Payer: Self-pay | Admitting: Family Medicine

## 2019-08-17 DIAGNOSIS — G250 Essential tremor: Secondary | ICD-10-CM

## 2019-09-12 ENCOUNTER — Other Ambulatory Visit: Payer: Self-pay | Admitting: Family Medicine

## 2019-10-22 NOTE — Progress Notes (Addendum)
[place] at [facility] [address] [place], [place] [address] [phone]  Date:  10/23/2019   Name:  Abigail [name]   DOB:  1951/04/25   MRN:  [id number]  PCP:  Darreld Mclean, MD    Chief Complaint: Lower Abdominal Pain (no blood, 2 weeks) and Medication Refill (atorvastation [facility])   History of Present Illness:  Abigail [name] is a 68 y.o. very pleasant female patient who presents with the following:  In office visit today with concern of pain in "my ovaries" Patient with history of diabetes, hypertension, GERD, serious ankle fracture in 2016 Her husband of many years also died on 2023-08-13 following a long illness  We discussed the passing of her husband [name] today, [name] feels that she has good family support and is doing okay.  Her son is local, and her daughter is coming to visit from [facility] for the holidays  She has noted lower abd pain for a couple of weeks- bilateral, in the lower pelvic area Not getting worse, but just persistent No vaginal bleeding She did have an ovarian cyst years ago- had some sort of operative procedure, she does not recall the details as it was years ago Pap normal in July She has felt nauseated but no vomiting No change in her bowel unfction  No urinary sx noted She does notice that her glucose has been high  She needs an A1c today When she changes her health insurance to medicare we may need to change her from [place] to another medication, she is just letting me know  Lab Results  Component Value Date   HGBA1C 7.5 (H) 06/03/2019   Lipitor Losartan Victoza Venlafaxine Trazodone Prilosec  Needs A1c, foot exam today  Patient Active Problem List   Diagnosis Date Noted  . Essential hypertension   . Gastroesophageal reflux disease without esophagitis   . Microscopic hematuria 09/16/2015  . Trimalleolar fracture of ankle, closed 11/17/2014  . Other and  unspecified hyperlipidemia 07/24/2013  . Chest pain 10/23/2012  . Diabetes mellitus ([place]) 10/08/2012  . Anxiety 10/08/2012    Past Medical History:  Diagnosis Date  . Anemia, unspecified   . Anxiety   . CKD (chronic kidney disease), stage III   . Depression   . Diabetes mellitus    Type 2  . GERD (gastroesophageal reflux disease)   . Hematuria   . Hyperlipidemia   . Hypertension   . Mild mitral regurgitation   . Normal coronary arteries    a. By cardiac CT 08/2017.    Past Surgical History:  Procedure Laterality Date  . CHOLECYSTECTOMY N/A 06/03/2015   Procedure: LAPAROSCOPIC CHOLECYSTECTOMY WITH INTRAOPERATIVE CHOLANGIOGRAM;  Surgeon: Donnie Mesa, MD;  Location: [place];  Service: General;  Laterality: N/A;  . COLONOSCOPY    . FRACTURE SURGERY    . HAMMER TOE SURGERY    . ORIF ANKLE FRACTURE Left 11/17/2014   Procedure: OPEN REDUCTION INTERNAL FIXATION (ORIF) LEFT TRIMALLEOLAR ANKLE FRACTURE;  Surgeon: Marianna Payment, MD;  Location: [place];  Service: Orthopedics;  Laterality: Left;  . OVARIAN CYST REMOVAL      Social History   Tobacco Use  . Smoking status: Never Smoker  . Smokeless tobacco: Never Used  Substance Use Topics  . Alcohol use: Yes    Alcohol/week: 1.0 standard drinks    Types: 1 Glasses of wine per week  . Drug use: No    Family History  Problem Relation Age of Onset  . Diabetes Mother   . Hyperlipidemia Mother   . Diabetes Father   . Heart disease Father 69       AMI x 2; first AMI age 42  . Stroke Father   . Heart disease Brother        defibrillator; CHF  . Hyperlipidemia Brother     Allergies  Allergen Reactions  . Pneumovax 23 [Pneumococcal Vac Polyvalent] Other (See Comments)    Pt had severe arm pain  . Amaryl [Glimepiride] Nausea Only  . Cephalexin Hives  . Sulfa Antibiotics Rash    Medication list has been reviewed and updated.  Current Outpatient Medications on File Prior to Visit  Medication Sig Dispense Refill  .  Accu-Chek FastClix Lancets MISC USE TO TEST BLOOD SUGAR UP TO FOUR TIMES DAILY AS DIRECTED 306 each 2  . atorvastatin (LIPITOR) 20 MG tablet TAKE 1 TABLET(20 MG) BY MOUTH DAILY 90 tablet 1  . blood glucose meter kit and supplies Dispense based on patient and insurance preference. Use up to four times daily as directed. (FOR ICD-10 E10.9, E11.9). 1 each 0  . glucose blood (ONETOUCH VERIO) test strip TEST BLOOD SUGARS TWICE DAILY. 200 each 2  . liraglutide (VICTOZA) 18 MG/3ML SOPN INJECT 1.8 MG INTO THE SKIN DAILY 18 mL 3  . losartan (COZAAR) 25 MG tablet TAKE 1 TABLET(25 MG) BY MOUTH DAILY 90 tablet 1  . losartan (COZAAR) 50 MG tablet Take 1 tablet (50 mg total) by mouth daily. 30 tablet 0  . Multiple Vitamins-Minerals (ALIVE WOMENS GUMMY) CHEW Chew 2 tablets by mouth daily.    . nitroGLYCERIN (NITROSTAT) 0.4 MG SL tablet PLACE 1 TABLET UNDER THE TONGUE EVERY 5 MINUTES AS NEEDED FOR CHEST PAIN 25 tablet 0  . omeprazole (PRILOSEC) 20 MG capsule TAKE 1 CAPSULE(20 MG) BY MOUTH DAILY 90 capsule 1  . propranolol (INDERAL) 10 MG tablet TAKE 1 TABLET BY MOUTH EVERY 8-12 HOURS AS NEEDED FOR TREMOR 270 tablet 1  . traZODone (DESYREL) 150 MG tablet Take 1 tablet (150 mg total) by mouth at bedtime. 90 tablet 3  . venlafaxine XR (EFFEXOR-XR) 150 MG 24 hr capsule Take 1 capsule (150 mg total) by mouth daily with breakfast. 30 capsule 11   No current facility-administered medications on file prior to visit.    Review of Systems:  As per HPI- otherwise negative.  No fever or chills, no chest pain or shortness of breath Notes that she has been eating more and has gained a few pounds-she plans to try to lose these again  Wt Readings from Last 3 Encounters:  10/23/19 156 lb (70.8 kg)  06/03/19 151 lb (68.5 kg)  02/01/18 142 lb 12.8 oz (64.8 kg)    Physical Examination: Vitals:   10/23/19 1335  BP: 130/82  Pulse: 91  Resp: 16  Temp: (!) 97.5 F (36.4 C)  SpO2: 97%   Vitals:   10/23/19 1335   Weight: 156 lb (70.8 kg)  Height: '5\' 6"'$  (1.676 m)   Body mass index is 25.18 kg/m. Ideal Body Weight: Weight in (lb) to have BMI = 25: 154.6  GEN: WDWN, NAD, Non-toxic, A & O x 3, normal weight, looks well HEENT: Atraumatic, Normocephalic. Neck supple. No masses, No LAD. Ears and Nose: No external deformity. CV: RRR, No M/G/R. No JVD. No thrill. No extra heart sounds. PULM: CTA B, no wheezes, crackles, rhonchi. No retractions. No resp. distress. No accessory muscle use. ABD: S, ND, +  BS. No rebound. No HSM.  She has minimal tenderness in the suprapubic region on exam EXTR: No c/c/e NEURO Normal gait.  PSYCH: Normally interactive. Conversant. Not depressed or anxious appearing.  Calm demeanor.   Results for orders placed or performed in visit on 10/23/19  POCT urinalysis dipstick  Result Value Ref Range   Color, UA yellow yellow   Clarity, UA cloudy (A) clear   Glucose, UA negative negative mg/dL   Bilirubin, UA negative negative   Ketones, POC UA negative negative mg/dL   Spec Grav, UA 1.025 1.010 - 1.025   Blood, UA trace-intact (A) negative   pH, UA 5.0 5.0 - 8.0   Protein Ur, POC trace (A) negative mg/dL   Urobilinogen, UA 0.2 0.2 or 1.0 E.U./dL   Nitrite, UA Negative Negative   Leukocytes, UA Negative Negative    Assessment and Plan: Diabetes mellitus without complication ([place]) - Plan: Hemoglobin A1c, CANCELED: Basic metabolic panel  Benign essential HTN  Adjustment reaction with anxiety and depression  Grief  Pelvic pain - Plan: Urine culture, POCT urinalysis dipstick, CBC, Comprehensive metabolic panel, CT Abdomen Pelvis W Contrast  Hematuria, unspecified type - Plan: Urinalysis, Routine w reflex microscopic  Dyslipidemia - Plan: atorvastatin (LIPITOR) 20 MG tablet  Here today with nonspecific lower abdominal/pelvic pain for about 2 weeks.  Urine appears benign, urine culture pending Diverticulitis is a possibility, she has never been diagnosed with this in  the past Ovarian cyst less likely given bilateral nature of pain and patient age 77 labs today, and will plan for a CT scan in the next day or 2.  We will have to get kidney function prior to giving contrast I have encouraged her to use a soft, low residue diet or liquids only for the next few days.  If she is getting worse or not doing okay she will let me know This visit occurred during the SARS-CoV-2 public health emergency.  Safety protocols were in place, including screening questions prior to the visit, additional usage of staff PPE, and extensive cleaning of exam room while observing appropriate contact time as indicated for disinfecting solutions.    Signed Lamar Blinks, MD  Received her labs 12/18- message to pt  Results for orders placed or performed in visit on 10/23/19  Urine culture   Specimen: Blood  Result Value Ref Range   MICRO NUMBER: [id number]    SPECIMEN QUALITY: Adequate    Sample Source NOT GIVEN    STATUS: FINAL    ISOLATE 1:      Growth of mixed flora was isolated, suggesting probable contamination. No further testing will be performed. If clinically indicated, recollection using a method to minimize contamination, with prompt transfer to Urine Culture Transport Tube, is  recommended.   Hemoglobin A1c  Result Value Ref Range   Hgb A1c MFr Bld 8.3 (H) 4.6 - 6.5 %  CBC  Result Value Ref Range   WBC 6.6 4.0 - 10.5 K/uL   RBC 4.60 3.87 - 5.11 Mil/uL   Platelets 225.0 150.0 - 400.0 K/uL   Hemoglobin 12.7 12.0 - 15.0 g/dL   HCT 38.9 36.0 - 46.0 %   MCV 84.7 78.0 - 100.0 fl   MCHC 32.7 30.0 - 36.0 g/dL   RDW 13.2 11.5 - 15.5 %  Urinalysis, Routine w reflex microscopic  Result Value Ref Range   Color, Urine YELLOW Yellow;Lt. Yellow;Straw;Dark Yellow;Amber;Green;Red;Brown   APPearance CLEAR Clear;Turbid;Slightly Cloudy;Cloudy   Specific Gravity, Urine >=1.030 (A) 1.000 - 1.030  pH 5.5 5.0 - 8.0   Total Protein, Urine NEGATIVE Negative   Urine Glucose  100 (A) Negative   Ketones, ur NEGATIVE Negative   Bilirubin Urine NEGATIVE Negative   Hgb urine dipstick TRACE-INTACT (A) Negative   Urobilinogen, UA 0.2 0.0 - 1.0   Leukocytes,Ua NEGATIVE Negative   Nitrite NEGATIVE Negative   WBC, UA none seen 0-2/hpf   RBC / HPF none seen 0-2/hpf   Squamous Epithelial / LPF Rare(0-4/hpf) Rare(0-4/hpf)   Amorphous Present (A) None;Present  Comprehensive metabolic panel  Result Value Ref Range   Sodium 136 135 - 145 mEq/L   Potassium 4.0 3.5 - 5.1 mEq/L   Chloride 100 96 - 112 mEq/L   CO2 29 19 - 32 mEq/L   Glucose, Bld 136 (H) 70 - 99 mg/dL   BUN 27 (H) 6 - 23 mg/dL   Creatinine, Ser 1.37 (H) 0.40 - 1.20 mg/dL   Total Bilirubin 0.3 0.2 - 1.2 mg/dL   Alkaline Phosphatase 97 39 - 117 U/L   AST 13 0 - 37 U/L   ALT 11 0 - 35 U/L   Total Protein 6.6 6.0 - 8.3 g/dL   Albumin 4.3 3.5 - 5.2 g/dL   GFR 38.10 (L) >60.00 mL/min   Calcium 9.0 8.4 - 10.5 mg/dL  POCT urinalysis dipstick  Result Value Ref Range   Color, UA yellow yellow   Clarity, UA cloudy (A) clear   Glucose, UA negative negative mg/dL   Bilirubin, UA negative negative   Ketones, POC UA negative negative mg/dL   Spec Grav, UA 1.025 1.010 - 1.025   Blood, UA trace-intact (A) negative   pH, UA 5.0 5.0 - 8.0   Protein Ur, POC trace (A) negative mg/dL   Urobilinogen, UA 0.2 0.2 or 1.0 E.U./dL   Nitrite, UA Negative Negative   Leukocytes, UA Negative Negative

## 2019-10-23 ENCOUNTER — Other Ambulatory Visit: Payer: Self-pay

## 2019-10-23 ENCOUNTER — Ambulatory Visit (INDEPENDENT_AMBULATORY_CARE_PROVIDER_SITE_OTHER): Payer: Medicare Other | Admitting: Family Medicine

## 2019-10-23 ENCOUNTER — Encounter: Payer: Self-pay | Admitting: Family Medicine

## 2019-10-23 VITALS — BP 130/82 | HR 91 | Temp 97.5°F | Resp 16 | Ht 66.0 in | Wt 156.0 lb

## 2019-10-23 DIAGNOSIS — R102 Pelvic and perineal pain: Secondary | ICD-10-CM

## 2019-10-23 DIAGNOSIS — F4323 Adjustment disorder with mixed anxiety and depressed mood: Secondary | ICD-10-CM

## 2019-10-23 DIAGNOSIS — E785 Hyperlipidemia, unspecified: Secondary | ICD-10-CM

## 2019-10-23 DIAGNOSIS — F4321 Adjustment disorder with depressed mood: Secondary | ICD-10-CM

## 2019-10-23 DIAGNOSIS — R319 Hematuria, unspecified: Secondary | ICD-10-CM

## 2019-10-23 DIAGNOSIS — N1832 Chronic kidney disease, stage 3b: Secondary | ICD-10-CM

## 2019-10-23 DIAGNOSIS — E119 Type 2 diabetes mellitus without complications: Secondary | ICD-10-CM | POA: Diagnosis not present

## 2019-10-23 DIAGNOSIS — I10 Essential (primary) hypertension: Secondary | ICD-10-CM | POA: Diagnosis not present

## 2019-10-23 LAB — POCT URINALYSIS DIP (MANUAL ENTRY)
Bilirubin, UA: NEGATIVE
Glucose, UA: NEGATIVE mg/dL
Ketones, POC UA: NEGATIVE mg/dL
Leukocytes, UA: NEGATIVE
Nitrite, UA: NEGATIVE
Spec Grav, UA: 1.025 (ref 1.010–1.025)
Urobilinogen, UA: 0.2 E.U./dL
pH, UA: 5 (ref 5.0–8.0)

## 2019-10-23 MED ORDER — ATORVASTATIN CALCIUM 20 MG PO TABS: ORAL_TABLET | ORAL | 3 refills | Status: DC

## 2019-10-23 NOTE — Patient Instructions (Signed)
Good to see you today- I will be in touch with your labs asap, and we will plan to set up a CT scan for your abd pain tomorrow or Friday  Please let me know if you start to get worse I would recommend a soft, easy to digest diet for the next couple of days- liquids only ok if you don't feel like eating  Please let me know if you need anything as you process your grief.

## 2019-10-24 LAB — URINALYSIS, ROUTINE W REFLEX MICROSCOPIC
Bilirubin Urine: NEGATIVE
Ketones, ur: NEGATIVE
Leukocytes,Ua: NEGATIVE
Nitrite: NEGATIVE
RBC / HPF: NONE SEEN (ref 0–?)
Specific Gravity, Urine: >=1.03 — AB (ref 1.000–1.030)
Total Protein, Urine: NEGATIVE
Urine Glucose: 100 — AB
Urobilinogen, UA: 0.2 (ref 0.0–1.0)
WBC, UA: NONE SEEN (ref 0–?)
pH: 5.5 (ref 5.0–8.0)

## 2019-10-24 LAB — COMPREHENSIVE METABOLIC PANEL
ALT: 11 U/L (ref 0–35)
AST: 13 U/L (ref 0–37)
Albumin: 4.3 g/dL (ref 3.5–5.2)
Alkaline Phosphatase: 97 U/L (ref 39–117)
BUN: 27 mg/dL — ABNORMAL HIGH (ref 6–23)
CO2: 29 mEq/L (ref 19–32)
Calcium: 9 mg/dL (ref 8.4–10.5)
Chloride: 100 mEq/L (ref 96–112)
Creatinine, Ser: 1.37 mg/dL — ABNORMAL HIGH (ref 0.40–1.20)
GFR: 38.1 mL/min — ABNORMAL LOW (ref >60.00)
Glucose, Bld: 136 mg/dL — ABNORMAL HIGH (ref 70–99)
Potassium: 4 mEq/L (ref 3.5–5.1)
Sodium: 136 mEq/L (ref 135–145)
Total Bilirubin: 0.3 mg/dL (ref 0.2–1.2)
Total Protein: 6.6 g/dL (ref 6.0–8.3)

## 2019-10-24 LAB — CBC
HCT: 38.9 % (ref 36.0–46.0)
Hemoglobin: 12.7 g/dL (ref 12.0–15.0)
MCHC: 32.7 g/dL (ref 30.0–36.0)
MCV: 84.7 fl (ref 78.0–100.0)
Platelets: 225 10*3/uL (ref 150.0–400.0)
RBC: 4.6 Mil/uL (ref 3.87–5.11)
RDW: 13.2 % (ref 11.5–15.5)
WBC: 6.6 10*3/uL (ref 4.0–10.5)

## 2019-10-24 LAB — URINE CULTURE
MICRO NUMBER:: 1204209
SPECIMEN QUALITY:: ADEQUATE

## 2019-10-24 LAB — HEMOGLOBIN A1C: Hgb A1c MFr Bld: 8.3 % — ABNORMAL HIGH (ref 4.6–6.5)

## 2019-10-25 ENCOUNTER — Encounter: Payer: Self-pay | Admitting: Family Medicine

## 2019-10-25 DIAGNOSIS — N1832 Chronic kidney disease, stage 3b: Secondary | ICD-10-CM

## 2019-10-25 DIAGNOSIS — E119 Type 2 diabetes mellitus without complications: Secondary | ICD-10-CM

## 2019-10-25 DIAGNOSIS — E785 Hyperlipidemia, unspecified: Secondary | ICD-10-CM

## 2019-10-25 NOTE — Addendum Note (Signed)
Addended by: Lamar Blinks C on: 10/25/2019 11:09 AM   Modules accepted: Orders

## 2019-10-28 MED ORDER — SITAGLIPTIN PHOSPHATE 50 MG PO TABS: 50.0000 mg | ORAL_TABLET | Freq: Every day | ORAL | 3 refills | Status: DC

## 2019-10-29 ENCOUNTER — Encounter: Payer: Self-pay | Admitting: Family Medicine

## 2019-10-29 ENCOUNTER — Encounter (HOSPITAL_BASED_OUTPATIENT_CLINIC_OR_DEPARTMENT_OTHER): Payer: Self-pay

## 2019-10-29 ENCOUNTER — Ambulatory Visit (HOSPITAL_BASED_OUTPATIENT_CLINIC_OR_DEPARTMENT_OTHER)
Admission: RE | Admit: 2019-10-29 | Discharge: 2019-10-29 | Disposition: A | Discharge status: Home / Self Care | Payer: Medicare Other | Source: Ambulatory Visit | Attending: Family Medicine | Admitting: Family Medicine

## 2019-10-29 ENCOUNTER — Other Ambulatory Visit: Payer: Self-pay

## 2019-10-29 DIAGNOSIS — R102 Pelvic and perineal pain: Secondary | ICD-10-CM | POA: Diagnosis present

## 2019-10-29 DIAGNOSIS — K449 Diaphragmatic hernia without obstruction or gangrene: Secondary | ICD-10-CM | POA: Diagnosis not present

## 2019-10-29 MED ORDER — IOHEXOL 300 MG/ML  SOLN
100.0000 mL | Freq: Once | INTRAMUSCULAR | Status: AC | PRN
  Administered 2019-10-29: 08:00:00 80 mL via INTRAVENOUS

## 2019-11-06 MED ORDER — SITAGLIPTIN PHOSPHATE 50 MG PO TABS: 50.0000 mg | ORAL_TABLET | Freq: Every day | ORAL | 3 refills | Status: DC

## 2019-11-06 MED ORDER — ATORVASTATIN CALCIUM 20 MG PO TABS: ORAL_TABLET | ORAL | 3 refills | Status: DC

## 2019-11-06 NOTE — Addendum Note (Signed)
Addended by: Lamar Blinks C on: 11/06/2019 07:44 AM   Modules accepted: Orders

## 2019-12-11 ENCOUNTER — Encounter: Payer: Self-pay | Admitting: Family Medicine

## 2019-12-25 ENCOUNTER — Other Ambulatory Visit: Payer: Self-pay | Admitting: Family Medicine

## 2019-12-25 DIAGNOSIS — K219 Gastro-esophageal reflux disease without esophagitis: Secondary | ICD-10-CM

## 2020-01-02 ENCOUNTER — Other Ambulatory Visit: Payer: Self-pay | Admitting: Family Medicine

## 2020-01-02 DIAGNOSIS — F329 Major depressive disorder, single episode, unspecified: Secondary | ICD-10-CM

## 2020-01-02 MED ORDER — TRAZODONE HCL 150 MG PO TABS: 150.0000 mg | ORAL_TABLET | Freq: Every day | ORAL | 3 refills | Status: DC

## 2020-01-02 NOTE — Telephone Encounter (Signed)
Medication:  traZODone (DESYREL) 150 MG tablet   Has the patient contacted their pharmacy? Yes.   (If no, request that the patient contact the pharmacy for the refill.) (If yes, when and what did the pharmacy advise?)  Preferred Pharmacy (with phone number or street name): [facility] [place], [address] AT [facility]  [place], [place] [address]  Phone:  [phone] Fax:  [phone]  DEA #:  [id number]     Agent: Please be advised that RX refills may take up to 3 business days. We ask that you follow-up with your pharmacy.

## 2020-01-13 ENCOUNTER — Ambulatory Visit: Payer: PPO | Attending: Internal Medicine

## 2020-01-13 DIAGNOSIS — Z23 Encounter for immunization: Secondary | ICD-10-CM | POA: Insufficient documentation

## 2020-01-13 NOTE — Progress Notes (Signed)
   Covid-19 Vaccination Clinic  Name:  Abigail [name]    MRN: [id number] DOB: 1951-02-10  01/13/2020  [name] was observed post Covid-19 immunization for 15 minutes without incident. She was provided with Vaccine Information Sheet and instruction to access the V-Safe system.   [name] was instructed to call 911 with any severe reactions post vaccine: [name] Difficulty breathing  . Swelling of face and throat  . A fast heartbeat  . A bad rash all over body  . Dizziness and weakness   Immunizations Administered    Name Date Dose VIS Date Route   Pfizer COVID-19 Vaccine 01/13/2020 10:49 AM 0.3 mL 10/18/2019 Intramuscular   Manufacturer: [place]   Lot: [id number]   [place]: [id number]

## 2020-01-22 ENCOUNTER — Encounter: Payer: Self-pay | Admitting: Family Medicine

## 2020-01-22 DIAGNOSIS — C44729 Squamous cell carcinoma of skin of left lower limb, including hip: Secondary | ICD-10-CM | POA: Diagnosis not present

## 2020-01-22 DIAGNOSIS — L821 Other seborrheic keratosis: Secondary | ICD-10-CM | POA: Diagnosis not present

## 2020-01-22 DIAGNOSIS — L570 Actinic keratosis: Secondary | ICD-10-CM | POA: Diagnosis not present

## 2020-01-22 DIAGNOSIS — Z85828 Personal history of other malignant neoplasm of skin: Secondary | ICD-10-CM | POA: Diagnosis not present

## 2020-01-29 ENCOUNTER — Encounter: Payer: Self-pay | Admitting: Family Medicine

## 2020-01-29 DIAGNOSIS — C4490 Unspecified malignant neoplasm of skin, unspecified: Secondary | ICD-10-CM | POA: Insufficient documentation

## 2020-01-29 HISTORY — DX: Unspecified malignant neoplasm of skin, unspecified: C44.90

## 2020-02-12 ENCOUNTER — Ambulatory Visit: Payer: PPO | Attending: Internal Medicine

## 2020-02-12 DIAGNOSIS — Z23 Encounter for immunization: Secondary | ICD-10-CM

## 2020-02-12 NOTE — Progress Notes (Signed)
   Covid-19 Vaccination Clinic  Name:  Abigail [name]    MRN: [id number] DOB: 23-Nov-1950  02/12/2020  [name] was observed post Covid-19 immunization for 15 minutes without incident. She was provided with Vaccine Information Sheet and instruction to access the V-Safe system.   [name] was instructed to call 911 with any severe reactions post vaccine: [name] Difficulty breathing  . Swelling of face and throat  . A fast heartbeat  . A bad rash all over body  . Dizziness and weakness   Immunizations Administered    Name Date Dose VIS Date Route   Pfizer COVID-19 Vaccine 02/12/2020 11:17 AM 0.3 mL 10/18/2019 Intramuscular   Manufacturer: [employer]   Lot: [id number]   [place]: [id number]

## 2020-02-18 ENCOUNTER — Encounter: Payer: Self-pay | Admitting: Family Medicine

## 2020-02-18 DIAGNOSIS — E119 Type 2 diabetes mellitus without complications: Secondary | ICD-10-CM

## 2020-02-18 MED ORDER — VICTOZA 18 MG/3ML ~~LOC~~ SOPN: PEN_INJECTOR | SUBCUTANEOUS | 3 refills | Status: DC

## 2020-02-21 ENCOUNTER — Other Ambulatory Visit: Payer: Self-pay

## 2020-02-22 NOTE — Progress Notes (Addendum)
[place] at [facility] [address] [place], [place] [address] [phone]  Date:  02/24/2020   Name:  Abigail [name]   DOB:  [birth year]   MRN:  [id number]  PCP:  Darreld Mclean, MD    Chief Complaint: Lab Work   History of Present Illness:  Abigail [name] is a 69 y.o. very pleasant female patient who presents with the following:  History of hypertension, diabetes, CKD, hyperlipidemia, serous ankle fracture in 2016 requiring surgery Abigail [name] lost her husband last year following a long illness Last seen by myself in December:  Here today with nonspecific lower abdominal/pelvic pain for about 2 weeks.  Urine appears benign, urine culture pending Diverticulitis is a possibility, she has never been diagnosed with this in the past Ovarian cyst less likely given bilateral nature of pain and patient age 17 labs today, and will plan for a CT scan in the next day or 2.  We will have to get kidney function prior to giving contrast I have encouraged her to use a soft, low residue diet or liquids only for the next few days.  If she is getting worse or not doing okay she will let me know  She has a son who lives locally, her daughter lives in [place] She recently moved in with her mother who is 12 yo- she is still pretty independent but it is helpful for Abigail [name] to be near to her.  Abigail [name] is no longer working, this allows her to take care of her mom Her mother unfortunately has has macular degeneration   They are having a celebration of life for [place] in June  She is feeling tired - not sleepy, more worn out.  She is normally pretty high energy She does not feel like she is depressed She is walking her dog twice a day- they do a few miles daily   Needs foot exam Her dexa is done per her GYN  Lab Results  Component Value Date   HGBA1C 8.3 (H) 10/23/2019   Update A1c today  She is looking forward to a couple of  upcoming trips to the beach  Patient Active Problem List   Diagnosis Date Noted  . 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/29/2020  . Essential hypertension   . Gastroesophageal reflux disease without esophagitis   . Microscopic hematuria 09/16/2015  . Trimalleolar fracture of ankle, closed 11/17/2014  . Other and unspecified hyperlipidemia 07/24/2013  . Chest pain 10/23/2012  . Diabetes mellitus ([place]) 10/08/2012  . Anxiety 10/08/2012    Past Medical History:  Diagnosis Date  . Anemia, unspecified   . Anxiety   . CKD (chronic kidney disease), stage III   . Depression   . Diabetes mellitus    Type 2  . GERD (gastroesophageal reflux disease)   . Hematuria   . Hyperlipidemia   . Hypertension   . Mild mitral regurgitation   . Normal coronary arteries    a. By cardiac CT 08/2017.  . 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    Past Surgical History:  Procedure Laterality Date  . CHOLECYSTECTOMY N/A 06/03/2015   Procedure: LAPAROSCOPIC CHOLECYSTECTOMY WITH INTRAOPERATIVE CHOLANGIOGRAM;  Surgeon: Donnie Mesa, MD;  Location: [facility];  Service: General;  Laterality: N/A;  . COLONOSCOPY    . FRACTURE SURGERY    . HAMMER TOE SURGERY    . ORIF ANKLE FRACTURE Left 11/17/2014   Procedure: OPEN REDUCTION INTERNAL FIXATION (  ORIF) LEFT TRIMALLEOLAR ANKLE FRACTURE;  Surgeon: Marianna Payment, MD;  Location: [facility] [place];  Service: Orthopedics;  Laterality: Left;  . OVARIAN CYST REMOVAL      Social History   Tobacco Use  . Smoking status: Never Smoker  . Smokeless tobacco: Never Used  Substance Use Topics  . Alcohol use: Yes    Alcohol/week: 1.0 standard drinks    Types: 1 Glasses of wine per week  . Drug use: No    Family History  Problem Relation Age of Onset  . Diabetes Mother   . Hyperlipidemia Mother   . Diabetes Father   . Heart disease Father 31       AMI x 2; first AMI age 61  . Stroke Father   . Heart disease Brother        defibrillator; CHF  . Hyperlipidemia Brother      Allergies  Allergen Reactions  . Pneumovax 23 [Pneumococcal Vac Polyvalent] Other (See Comments)    Pt had severe arm pain  . Amaryl [Glimepiride] Nausea Only  . Cephalexin Hives  . Sulfa Antibiotics Rash    Medication list has been reviewed and updated.  Current Outpatient Medications on File Prior to Visit  Medication Sig Dispense Refill  . Accu-Chek FastClix Lancets MISC USE TO TEST BLOOD SUGAR UP TO FOUR TIMES DAILY AS DIRECTED 306 each 2  . Ascorbic Acid (VITAMIN C) 1000 MG tablet Take 1,000 mg by mouth daily.    [name] atorvastatin (LIPITOR) 20 MG tablet TAKE 1 TABLET(20 MG) BY MOUTH DAILY 90 tablet 3  . blood glucose meter kit and supplies Dispense based on patient and insurance preference. Use up to four times daily as directed. (FOR ICD-10 E10.9, E11.9). 1 each 0  . glucose blood (ONETOUCH VERIO) test strip TEST BLOOD SUGARS TWICE DAILY. 200 each 2  . liraglutide (VICTOZA) 18 MG/3ML SOPN INJECT 1.8 MG INTO THE SKIN DAILY 18 mL 3  . losartan (COZAAR) 25 MG tablet TAKE 1 TABLET(25 MG) BY MOUTH DAILY 90 tablet 1  . Multiple Vitamins-Minerals (ALIVE WOMENS GUMMY) CHEW Chew 2 tablets by mouth daily.    . nitroGLYCERIN (NITROSTAT) 0.4 MG SL tablet PLACE 1 TABLET UNDER THE TONGUE EVERY 5 MINUTES AS NEEDED FOR CHEST PAIN 25 tablet 0  . omeprazole (PRILOSEC) 20 MG capsule TAKE 1 CAPSULE(20 MG) BY MOUTH DAILY 90 capsule 1  . propranolol (INDERAL) 10 MG tablet TAKE 1 TABLET BY MOUTH EVERY 8-12 HOURS AS NEEDED FOR TREMOR 270 tablet 1  . sitaGLIPtin (JANUVIA) 50 MG tablet Take 1 tablet (50 mg total) by mouth daily. 90 tablet 3  . traZODone (DESYREL) 150 MG tablet Take 1 tablet (150 mg total) by mouth at bedtime. 90 tablet 3  . venlafaxine XR (EFFEXOR-XR) 150 MG 24 hr capsule Take 1 capsule (150 mg total) by mouth daily with breakfast. 30 capsule 11  . zinc gluconate 50 MG tablet Take 50 mg by mouth daily.     No current facility-administered medications on file prior to visit.    Review  of Systems:  As per HPI- otherwise negative.   Physical Examination: Vitals:   02/24/20 1503  BP: 130/84  Pulse: 85  Resp: 16  Temp: 97.7 F (36.5 C)  SpO2: 98%   Vitals:   02/24/20 1503  Weight: 154 lb (69.9 kg)  Height: 5' 6"  (1.676 m)   Body mass index is 24.86 kg/m. Ideal Body Weight: Weight in (lb) to have BMI = 25: 154.6  GEN: no  acute distress.  Looks well, her normal self HEENT: Atraumatic, Normocephalic.  Ears and Nose: No external deformity. CV: RRR, No M/G/R. No JVD. No thrill. No extra heart sounds. PULM: CTA B, no wheezes, crackles, rhonchi. No retractions. No resp. distress. No accessory muscle use. ABD: S, NT, ND, +BS. No rebound. No HSM. EXTR: No c/c/e PSYCH: Normally interactive. Conversant.  Foot exam- normal except I cannot palpate dorsal pulse on right foot, pedal pulses strong.  Patient denies any claudication symptoms  Assessment and Plan: Diabetes mellitus type 2 in nonobese ([place]) - Plan: Hemoglobin A1c  Dyslipidemia - Plan: Lipid panel  Chronic renal impairment, stage 3b - Plan: Basic metabolic panel  Benign essential HTN - Plan: Basic metabolic panel, CBC  Fatigue, unspecified type - Plan: TSH, Vitamin D (25 hydroxy)  Here today for follow-up visit Labs pending as above Abigail [name] has felt tired, she does not think she is depressed.  We will check her vitamin D.  I will follow up with her with her results, she will keep me posted Moderate medical decision making today This visit occurred during the SARS-CoV-2 public health emergency.  Safety protocols were in place, including screening questions prior to the visit, additional usage of staff PPE, and extensive cleaning of exam room while observing appropriate contact time as indicated for disinfecting solutions.    Signed Lamar Blinks, MD  Received her labs 4/21, message to patient  Renal function is no better She is not taking Metformin or any diuretic, taking losartan 25 A1c is  slightly improved Results for orders placed or performed in visit on 02/24/20  Hemoglobin A1c  Result Value Ref Range   Hgb A1c MFr Bld 7.9 (H) 4.6 - 6.5 %  Basic metabolic panel  Result Value Ref Range   Sodium 135 135 - 145 mEq/L   Potassium 4.3 3.5 - 5.1 mEq/L   Chloride 94 (L) 96 - 112 mEq/L   CO2 32 19 - 32 mEq/L   Glucose, Bld 97 70 - 99 mg/dL   BUN 19 6 - 23 mg/dL   Creatinine, Ser 1.53 (H) 0.40 - 1.20 mg/dL   GFR 33.51 (L) >60.00 mL/min   Calcium 9.2 8.4 - 10.5 mg/dL  Lipid panel  Result Value Ref Range   Cholesterol 173 0 - 200 mg/dL   Triglycerides 199.0 (H) 0.0 - 149.0 mg/dL   HDL 56.60 >39.00 mg/dL   VLDL 39.8 0.0 - 40.0 mg/dL   LDL Cholesterol 76 0 - 99 mg/dL   Total CHOL/HDL Ratio 3    NonHDL 115.98   CBC  Result Value Ref Range   WBC 7.7 4.0 - 10.5 K/uL   RBC 4.61 3.87 - 5.11 Mil/uL   Platelets 239.0 150.0 - 400.0 K/uL   Hemoglobin 13.1 12.0 - 15.0 g/dL   HCT 39.1 36.0 - 46.0 %   MCV 84.7 78.0 - 100.0 fl   MCHC 33.6 30.0 - 36.0 g/dL   RDW 13.8 11.5 - 15.5 %  TSH  Result Value Ref Range   TSH 2.16 0.35 - 4.50 uIU/mL  Vitamin D (25 hydroxy)  Result Value Ref Range   VITD 37.36 30.00 - 100.00 ng/mL

## 2020-02-22 NOTE — Patient Instructions (Addendum)
It was great to see you today, I will be in touch with your labs as soon as possible  Assuming all is well we can plan to visit in 6 months Please do see your GYN about pelvic pain as we mentioned- ask her about your bone density scan

## 2020-02-24 ENCOUNTER — Encounter: Payer: Self-pay | Admitting: Family Medicine

## 2020-02-24 ENCOUNTER — Other Ambulatory Visit: Payer: Self-pay

## 2020-02-24 ENCOUNTER — Ambulatory Visit (INDEPENDENT_AMBULATORY_CARE_PROVIDER_SITE_OTHER): Payer: PPO | Admitting: Family Medicine

## 2020-02-24 VITALS — BP 130/84 | HR 85 | Temp 97.7°F | Resp 16 | Ht 66.0 in | Wt 154.0 lb

## 2020-02-24 DIAGNOSIS — N1832 Chronic kidney disease, stage 3b: Secondary | ICD-10-CM | POA: Diagnosis not present

## 2020-02-24 DIAGNOSIS — R5383 Other fatigue: Secondary | ICD-10-CM

## 2020-02-24 DIAGNOSIS — E785 Hyperlipidemia, unspecified: Secondary | ICD-10-CM

## 2020-02-24 DIAGNOSIS — I10 Essential (primary) hypertension: Secondary | ICD-10-CM | POA: Diagnosis not present

## 2020-02-24 DIAGNOSIS — E119 Type 2 diabetes mellitus without complications: Secondary | ICD-10-CM

## 2020-02-25 LAB — LIPID PANEL
Cholesterol: 173 mg/dL (ref 0–200)
HDL: 56.6 mg/dL (ref >39.00)
LDL Cholesterol: 76 mg/dL (ref 0–99)
NonHDL: 115.98
Total CHOL/HDL Ratio: 3
Triglycerides: 199 mg/dL — ABNORMAL HIGH (ref 0.0–149.0)
VLDL: 39.8 mg/dL (ref 0.0–40.0)

## 2020-02-25 LAB — CBC
HCT: 39.1 % (ref 36.0–46.0)
Hemoglobin: 13.1 g/dL (ref 12.0–15.0)
MCHC: 33.6 g/dL (ref 30.0–36.0)
MCV: 84.7 fl (ref 78.0–100.0)
Platelets: 239 10*3/uL (ref 150.0–400.0)
RBC: 4.61 Mil/uL (ref 3.87–5.11)
RDW: 13.8 % (ref 11.5–15.5)
WBC: 7.7 10*3/uL (ref 4.0–10.5)

## 2020-02-25 LAB — TSH: TSH: 2.16 u[IU]/mL (ref 0.35–4.50)

## 2020-02-25 LAB — BASIC METABOLIC PANEL
BUN: 19 mg/dL (ref 6–23)
CO2: 32 mEq/L (ref 19–32)
Calcium: 9.2 mg/dL (ref 8.4–10.5)
Chloride: 94 mEq/L — ABNORMAL LOW (ref 96–112)
Creatinine, Ser: 1.53 mg/dL — ABNORMAL HIGH (ref 0.40–1.20)
GFR: 33.51 mL/min — ABNORMAL LOW (ref >60.00)
Glucose, Bld: 97 mg/dL (ref 70–99)
Potassium: 4.3 mEq/L (ref 3.5–5.1)
Sodium: 135 mEq/L (ref 135–145)

## 2020-02-25 LAB — HEMOGLOBIN A1C: Hgb A1c MFr Bld: 7.9 % — ABNORMAL HIGH (ref 4.6–6.5)

## 2020-02-25 LAB — VITAMIN D 25 HYDROXY (VIT D DEFICIENCY, FRACTURES): VITD: 37.36 ng/mL (ref 30.00–100.00)

## 2020-02-26 ENCOUNTER — Encounter: Payer: Self-pay | Admitting: Family Medicine

## 2020-03-09 ENCOUNTER — Encounter: Payer: Self-pay | Admitting: Family Medicine

## 2020-03-09 DIAGNOSIS — N1832 Chronic kidney disease, stage 3b: Secondary | ICD-10-CM | POA: Diagnosis not present

## 2020-03-09 DIAGNOSIS — I129 Hypertensive chronic kidney disease with stage 1 through stage 4 chronic kidney disease, or unspecified chronic kidney disease: Secondary | ICD-10-CM | POA: Diagnosis not present

## 2020-03-09 DIAGNOSIS — E1122 Type 2 diabetes mellitus with diabetic chronic kidney disease: Secondary | ICD-10-CM | POA: Diagnosis not present

## 2020-03-10 DIAGNOSIS — N1832 Chronic kidney disease, stage 3b: Secondary | ICD-10-CM | POA: Diagnosis not present

## 2020-04-21 DIAGNOSIS — N958 Other specified menopausal and perimenopausal disorders: Secondary | ICD-10-CM | POA: Diagnosis not present

## 2020-04-27 DIAGNOSIS — N1832 Chronic kidney disease, stage 3b: Secondary | ICD-10-CM | POA: Diagnosis not present

## 2020-05-01 NOTE — Progress Notes (Signed)
Subjective:   Abigail [name] is a 69 y.o. female who presents for Medicare Annual (Subsequent) preventive examination.  Review of Systems    Cardiac Risk Factors include: advanced age (>65mn, >>53women);hypertension;diabetes mellitus     Objective:    Today's Vitals   05/04/20 1455  BP: 128/76  Pulse: 91  Temp: (!) 97 F (36.1 C)  TempSrc: Temporal  SpO2: 99%  Weight: 153 lb (69.4 kg)  Height: 5' 6"  (1.676 m)   Body mass index is 24.69 kg/m.  Advanced Directives 05/04/2020 08/17/2017 08/17/2017 06/01/2015 11/18/2014 11/10/2014  Does Patient Have a Medical Advance Directive? Yes No No No No No  Type of Advance Directive Living will - - - - -  Does patient want to make changes to medical advance directive? No - Patient declined - - - - -  Would patient like information on creating a medical advance directive? - No - Patient declined - No - patient declined information Yes - Educational materials given Yes - Educational materials given    Current Medications (verified) Outpatient Encounter Medications as of 05/04/2020  Medication Sig  . Accu-Chek FastClix Lancets MISC USE TO TEST BLOOD SUGAR UP TO FOUR TIMES DAILY AS DIRECTED  . Ascorbic Acid (VITAMIN C) 1000 MG tablet Take 1,000 mg by mouth daily.  .Marland Kitchenatorvastatin (LIPITOR) 20 MG tablet TAKE 1 TABLET(20 MG) BY MOUTH DAILY  . blood glucose meter kit and supplies Dispense based on patient and insurance preference. Use up to four times daily as directed. (FOR ICD-10 E10.9, E11.9).  .Marland Kitchenglucose blood (ONETOUCH VERIO) test strip TEST BLOOD SUGARS TWICE DAILY.  .Marland Kitchenliraglutide (VICTOZA) 18 MG/3ML SOPN INJECT 1.8 MG INTO THE SKIN DAILY  . losartan (COZAAR) 25 MG tablet TAKE 1 TABLET(25 MG) BY MOUTH DAILY  . Multiple Vitamins-Minerals (ALIVE WOMENS GUMMY) CHEW Chew 2 tablets by mouth daily.  .Marland Kitchenomeprazole (PRILOSEC) 20 MG capsule TAKE 1 CAPSULE(20 MG) BY MOUTH DAILY  . propranolol (INDERAL) 10 MG tablet TAKE 1 TABLET BY MOUTH EVERY 8-12  HOURS AS NEEDED FOR TREMOR  . sitaGLIPtin (JANUVIA) 50 MG tablet Take 1 tablet (50 mg total) by mouth daily.  . traZODone (DESYREL) 150 MG tablet Take 1 tablet (150 mg total) by mouth at bedtime.  .Marland Kitchenvenlafaxine XR (EFFEXOR-XR) 150 MG 24 hr capsule Take 1 capsule (150 mg total) by mouth daily with breakfast.  . zinc gluconate 50 MG tablet Take 50 mg by mouth daily.  . nitroGLYCERIN (NITROSTAT) 0.4 MG SL tablet PLACE 1 TABLET UNDER THE TONGUE EVERY 5 MINUTES AS NEEDED FOR CHEST PAIN (Patient not taking: Reported on 05/04/2020)   No facility-administered encounter medications on file as of 05/04/2020.    Allergies (verified) Pneumovax 23 [pneumococcal vac polyvalent], Amaryl [glimepiride], Cephalexin, and Sulfa antibiotics   History: Past Medical History:  Diagnosis Date  . Anemia, unspecified   . Anxiety   . CKD (chronic kidney disease), stage III   . Depression   . Diabetes mellitus    Type 2  . GERD (gastroesophageal reflux disease)   . Hematuria   . Hyperlipidemia   . Hypertension   . Mild mitral regurgitation   . Normal coronary arteries    a. By cardiac CT 08/2017.  . 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, GLaurel Dimmer  Past Surgical History:  Procedure Laterality Date  . CHOLECYSTECTOMY N/A 06/03/2015   Procedure: LAPAROSCOPIC CHOLECYSTECTOMY WITH INTRAOPERATIVE CHOLANGIOGRAM;  Surgeon: MDonnie Mesa MD;  [facility] [facility]  Service: General;  Laterality: N/A;  . COLONOSCOPY    . FRACTURE SURGERY    . HAMMER TOE SURGERY    . ORIF ANKLE FRACTURE Left 11/17/2014   Procedure: OPEN REDUCTION INTERNAL FIXATION (ORIF) LEFT TRIMALLEOLAR ANKLE FRACTURE;  Surgeon: Marianna Payment, MD;  Location: [facility];  Service: Orthopedics;  Laterality: Left;  . OVARIAN CYST REMOVAL     Family History  Problem Relation Age of Onset  . Diabetes Mother   . Hyperlipidemia Mother   . Diabetes Father   . Heart disease Father 65       AMI x 2; first AMI age 54  . Stroke Father   . Heart  disease Brother        defibrillator; CHF  . Hyperlipidemia Brother    Social History   Socioeconomic History  . Marital status: Married    Spouse name: Not on file  . Number of children: Not on file  . Years of education: Not on file  . Highest education level: Not on file  Occupational History  . Not on file  Tobacco Use  . Smoking status: Never Smoker  . Smokeless tobacco: Never Used  Vaping Use  . Vaping Use: Never used  Substance and Sexual Activity  . Alcohol use: Yes    Alcohol/week: 1.0 standard drink    Types: 1 Glasses of wine per week  . Drug use: No  . Sexual activity: Yes    Birth control/protection: None  Other Topics Concern  . Not on file  Social History Narrative   Marital status: married x 1970.      Children:  2 children; 7 grandchildren.      Employment:  [employer].      Tobacco: none       Alcohol: sporadic       Exercises: walking 3 days per week.   Social Determinants of Health   Financial Resource Strain: Medium Risk  . Difficulty of Paying Living Expenses: Somewhat hard  Food Insecurity: No Food Insecurity  . Worried About Charity fundraiser in the Last Year: Never true  . Ran Out of Food in the Last Year: Never true  Transportation Needs: No Transportation Needs  . Lack of Transportation (Medical): No  . Lack of Transportation (Non-Medical): No  Physical Activity:   . Days of Exercise per Week:   . Minutes of Exercise per Session:   Stress:   . Feeling of Stress :   Social Connections:   . Frequency of Communication with Friends and Family:   . Frequency of Social Gatherings with Friends and Family:   . Attends Religious Services:   . Active Member of Clubs or Organizations:   . Attends Archivist Meetings:   [name] Marital Status:     Tobacco Counseling Counseling given: Not Answered   Clinical Intake: Pain : No/denies pain     Activities of Daily Living In your present state of health, do you  have any difficulty performing the following activities: 05/04/2020  Hearing? N  Vision? N  Difficulty concentrating or making decisions? N  Walking or climbing stairs? N  Dressing or bathing? N  Doing errands, shopping? N  Preparing Food and eating ? N  Using the Toilet? N  In the past six months, have you accidently leaked urine? N  Do you have problems with loss of bowel control? N  Managing your Medications? N  Managing your Finances? N  Housekeeping or managing your Housekeeping? N  Some recent data might be hidden    Patient Care Team: Copland, Gay Filler, MD as PCP - General (Family Medicine)  Indicate any recent Medical Services you may have received from other than [facility] providers in the past year (date may be approximate).     Assessment:   This is a routine wellness examination for [place].  Dietary issues and exercise activities discussed: Current Exercise Habits: Home exercise routine, Type of exercise: walking, Time (Minutes): 30, Intensity: Mild, Exercise limited by: None identified   Diet (meal preparation, eat out, water intake, caffeinated beverages, dairy products, fruits and vegetables): well balanced  Goals    . DIET - INCREASE WATER INTAKE      Depression Screen PHQ 2/9 Scores 05/04/2020 08/17/2017 02/10/2016 08/05/2015 05/04/2015 11/03/2014  PHQ - 2 Score 0 0 0 0 0 0    Fall Risk Fall Risk  05/04/2020 09/25/2018 08/17/2017 02/10/2016 11/03/2014  Falls in the past year? 0 0 No No No  Comment - Emmi Telephone Survey: data to providers prior to load - - -  Number falls in past yr: 0 - - - -  Injury with Fall? 0 - - - -  Follow up Education provided;Falls prevention discussed - - - -   Mother lives w/ her.  Any stairs in or around the home? Yes  If so, are there any without handrails? No  Home free of loose throw rugs in walkways, pet beds, electrical cords, etc? Yes  Adequate lighting in your home to reduce risk of falls? Yes   ASSISTIVE DEVICES UTILIZED  TO PREVENT FALLS:  Life alert? No  Use of a cane, walker or w/c? No  Grab bars in the bathroom? No  Shower chair or bench in shower? No  Elevated toilet seat or a handicapped toilet? No    Cognitive Function: Ad8 score reviewed for issues:  Issues making decisions:no  Less interest in hobbies / activities:no  Repeats questions, stories (family complaining):no  Trouble using ordinary gadgets (microwave, computer, phone):no  Forgets the month or year: no  Mismanaging finances: no  Remembering appts:no  Daily problems with thinking and/or memory:no Ad8 score is=0        Immunizations Immunization History  Administered Date(s) Administered  . Fluad Quad(high Dose 65+) 07/01/2019  . Influenza Whole 08/08/2013  . Influenza, High Dose Seasonal PF 08/03/2016, 08/17/2017  . Influenza, Seasonal, Injecte, Preservative Fre 10/08/2012  . Influenza,inj,Quad PF,6+ Mos 08/05/2015  . Influenza-Unspecified 08/08/2014  . PFIZER SARS-COV-2 Vaccination 01/13/2020, 02/12/2020  . Pneumococcal Conjugate-13 11/03/2014  . Pneumococcal Polysaccharide-23 02/10/2016    TDAP status: Due, Education has been provided regarding the importance of this vaccine. Advised may receive this vaccine at local pharmacy or Health Dept. Aware to provide a copy of the vaccination record if obtained from local pharmacy or Health Dept. Verbalized acceptance and understanding. Flu Vaccine status: Up to date Pneumococcal vaccine status: Up to date Covid-19 vaccine status: Completed vaccines   Screening Tests Health Maintenance  Topic Date Due  . OPHTHALMOLOGY EXAM  06/04/2020  . INFLUENZA VACCINE  06/07/2020  . HEMOGLOBIN A1C  08/25/2020  . FOOT EXAM  02/23/2021  . TETANUS/TDAP  03/09/2021  . MAMMOGRAM  06/11/2021  . COLONOSCOPY  06/20/2023  . DEXA SCAN  Completed  . COVID-19 Vaccine  Completed  . Hepatitis C Screening  Completed  . PNA vac Low Risk Adult  Completed    Health Maintenance  There  are no preventive care reminders to display for this patient.  Colorectal cancer screening: Completed 06/19/18. Repeat every ( no recall on file ) years Mammogram status: Completed 06/12/19. Repeat every year Dexa: Pt states done w/ Dr.Grewall a few wks ago and will have report sent.   Lung Cancer Screening: (Low Dose CT Chest recommended if Age 75-80 years, 30 pack-year currently smoking OR have quit w/in 15years.) does not qualify.    Additional Screening:  Hepatitis C Screening: - Completed 08/05/15   Vision Screening: Recommended annual ophthalmology exams for early detection of glaucoma and other disorders of the eye. Is the patient up to date with their annual eye exam?  Yes  Who is the provider or what is the name of the office in which the patient attends annual eye exams? Dr.Scott   Dental Screening: Recommended annual dental exams for proper oral hygiene  Community Resource Referral / Chronic Care Management: CRR required this visit?  No   CCM required this visit?  No      Plan:    Please schedule your next medicare wellness visit with me in 1 yr.  Continue to eat heart healthy diet (full of fruits, vegetables, whole grains, lean protein, water--limit salt, fat, and sugar intake) and increase physical activity as tolerated.  Continue doing brain stimulating activities (puzzles, reading, adult coloring books, staying active) to keep memory sharp.    I have personally reviewed and noted the following in the patient's chart:   . Medical and social history . Use of alcohol, tobacco or illicit drugs  . Current medications and supplements . Functional ability and status . Nutritional status . Physical activity . Advanced directives . List of other physicians . Hospitalizations, surgeries, and ER visits in previous 12 months . Vitals . Screenings to include cognitive, depression, and falls . Referrals and appointments  In addition, I have reviewed and discussed with  patient certain preventive protocols, quality metrics, and best practice recommendations. A written personalized care plan for preventive services as well as general preventive health recommendations were provided to patient.     Shela Nevin, [place]   05/04/2020   Nurse Notes: Pt states her Januvia and Victoza are becoming too expensive for her to afford. Will notify PCP.

## 2020-05-04 ENCOUNTER — Other Ambulatory Visit: Payer: Self-pay

## 2020-05-04 ENCOUNTER — Ambulatory Visit (INDEPENDENT_AMBULATORY_CARE_PROVIDER_SITE_OTHER): Payer: PPO | Admitting: *Deleted

## 2020-05-04 ENCOUNTER — Encounter: Payer: Self-pay | Admitting: *Deleted

## 2020-05-04 VITALS — BP 128/76 | HR 91 | Temp 97.0°F | Ht 66.0 in | Wt 153.0 lb

## 2020-05-04 DIAGNOSIS — Z0000 Encounter for general adult medical examination without abnormal findings: Secondary | ICD-10-CM

## 2020-05-04 DIAGNOSIS — I10 Essential (primary) hypertension: Secondary | ICD-10-CM

## 2020-05-04 MED ORDER — LOSARTAN POTASSIUM 25 MG PO TABS: ORAL_TABLET | ORAL | 3 refills | Status: DC

## 2020-05-04 MED ORDER — NITROGLYCERIN 0.4 MG SL SUBL: SUBLINGUAL_TABLET | SUBLINGUAL | 2 refills | Status: DC

## 2020-05-04 NOTE — Patient Instructions (Signed)
[name] , Thank you for taking time to come for your Medicare Wellness Visit. I appreciate your ongoing commitment to your health goals. Please review the following plan we discussed and let me know if I can assist you in the future.   Screening recommendations/referrals: Colorectal cancer screening: Completed 06/19/18. Repeat every ( no recall on file ) years Mammogram status: Completed 06/12/19. Repeat every year Dexa: Pt states done w/ Dr.Grewall a few wks ago  Recommended yearly ophthalmology/optometry visit for glaucoma screening and checkup Recommended yearly dental visit for hygiene and checkup  Vaccinations: Colorectal cancer screening: Completed 06/19/18. Repeat every ( no recall on file ) years Mammogram status: Completed 06/12/19. Repeat every year Dexa: Pt states done w/ Dr.Grewall a few wks ago and will have report sent.   Next appointment: Follow up in one year for your annual wellness visit    Preventive Care 65 Years and Older, Female Preventive care refers to lifestyle choices and visits with your health care provider that can promote health and wellness. What does preventive care include?  A yearly physical exam. This is also called an annual well check.  Dental exams once or twice a year.  Routine eye exams. Ask your health care provider how often you should have your eyes checked.  Personal lifestyle choices, including:  Daily care of your teeth and gums.  Regular physical activity.  Eating a healthy diet.  Avoiding tobacco and drug use.  Limiting alcohol use.  Practicing safe sex.  Taking low-dose aspirin every day.  Taking vitamin and mineral supplements as recommended by your health care provider. What happens during an annual well check? The services and screenings done by your health care provider during your annual well check will depend on your age, overall health, lifestyle risk factors, and family history of disease. Counseling  Your health  care provider may ask you questions about your:  Alcohol use.  Tobacco use.  Drug use.  Emotional well-being.  Home and relationship well-being.  Sexual activity.  Eating habits.  History of falls.  Memory and ability to understand (cognition).  Work and work Statistician.  Reproductive health. Screening  You may have the following tests or measurements:  Height, weight, and BMI.  Blood pressure.  Lipid and cholesterol levels. These may be checked every 5 years, or more frequently if you are over 50 years old.  Skin check.  Lung cancer screening. You may have this screening every year starting at age 36 if you have a 30-pack-year history of smoking and currently smoke or have quit within the past 15 years.  Fecal occult blood test (FOBT) of the stool. You may have this test every year starting at age 55.  Flexible sigmoidoscopy or colonoscopy. You may have a sigmoidoscopy every 5 years or a colonoscopy every 10 years starting at age 62.  Hepatitis C blood test.  Hepatitis B blood test.  Sexually transmitted disease (STD) testing.  Diabetes screening. This is done by checking your blood sugar (glucose) after you have not eaten for a while (fasting). You may have this done every 1-3 years.  Bone density scan. This is done to screen for osteoporosis. You may have this done starting at age 49.  Mammogram. This may be done every 1-2 years. Talk to your health care provider about how often you should have regular mammograms. Talk with your health care provider about your test results, treatment options, and if necessary, the need for more tests. Vaccines  Your health care  provider may recommend certain vaccines, such as:  Influenza vaccine. This is recommended every year.  Tetanus, diphtheria, and acellular pertussis (Tdap, Td) vaccine. You may need a Td booster every 10 years.  Zoster vaccine. You may need this after age 27.  Pneumococcal 13-valent conjugate  (PCV13) vaccine. One dose is recommended after age 54.  Pneumococcal polysaccharide (PPSV23) vaccine. One dose is recommended after age 54. Talk to your health care provider about which screenings and vaccines you need and how often you need them. This information is not intended to replace advice given to you by your health care provider. Make sure you discuss any questions you have with your health care provider. Document Released: 11/20/2015 Document Revised: 07/13/2016 Document Reviewed: 08/25/2015 Elsevier Interactive Patient Education  2017 [place] Prevention in the Home Falls can cause injuries. They can happen to people of all ages. There are many things you can do to make your home safe and to help prevent falls. What can I do on the outside of my home?  Regularly fix the edges of walkways and driveways and fix any cracks.  Remove anything that might make you trip as you walk through a door, such as a raised step or threshold.  Trim any bushes or trees on the path to your home.  Use bright outdoor lighting.  Clear any walking paths of anything that might make someone trip, such as rocks or tools.  Regularly check to see if handrails are loose or broken. Make sure that both sides of any steps have handrails.  Any raised decks and porches should have guardrails on the edges.  Have any leaves, snow, or ice cleared regularly.  Use sand or salt on walking paths during winter.  Clean up any spills in your garage right away. This includes oil or grease spills. What can I do in the bathroom?  Use night lights.  Install grab bars by the toilet and in the tub and shower. Do not use towel bars as grab bars.  Use non-skid mats or decals in the tub or shower.  If you need to sit down in the shower, use a plastic, non-slip stool.  Keep the floor dry. Clean up any water that spills on the floor as soon as it happens.  Remove soap buildup in the tub or shower  regularly.  Attach bath mats securely with double-sided non-slip rug tape.  Do not have throw rugs and other things on the floor that can make you trip. What can I do in the bedroom?  Use night lights.  Make sure that you have a light by your bed that is easy to reach.  Do not use any sheets or blankets that are too big for your bed. They should not hang down onto the floor.  Have a firm chair that has side arms. You can use this for support while you get dressed.  Do not have throw rugs and other things on the floor that can make you trip. What can I do in the kitchen?  Clean up any spills right away.  Avoid walking on wet floors.  Keep items that you use a lot in easy-to-reach places.  If you need to reach something above you, use a strong step stool that has a grab bar.  Keep electrical cords out of the way.  Do not use floor polish or wax that makes floors slippery. If you must use wax, use non-skid floor wax.  Do not have throw  rugs and other things on the floor that can make you trip. What can I do with my stairs?  Do not leave any items on the stairs.  Make sure that there are handrails on both sides of the stairs and use them. Fix handrails that are broken or loose. Make sure that handrails are as long as the stairways.  Check any carpeting to make sure that it is firmly attached to the stairs. Fix any carpet that is loose or worn.  Avoid having throw rugs at the top or bottom of the stairs. If you do have throw rugs, attach them to the floor with carpet tape.  Make sure that you have a light switch at the top of the stairs and the bottom of the stairs. If you do not have them, ask someone to add them for you. What else can I do to help prevent falls?  Wear shoes that:  Do not have high heels.  Have rubber bottoms.  Are comfortable and fit you well.  Are closed at the toe. Do not wear sandals.  If you use a stepladder:  Make sure that it is fully  opened. Do not climb a closed stepladder.  Make sure that both sides of the stepladder are locked into place.  Ask someone to hold it for you, if possible.  Clearly mark and make sure that you can see:  Any grab bars or handrails.  First and last steps.  Where the edge of each step is.  Use tools that help you move around (mobility aids) if they are needed. These include:  Canes.  Walkers.  Scooters.  Crutches.  Turn on the lights when you go into a dark area. Replace any light bulbs as soon as they burn out.  Set up your furniture so you have a clear path. Avoid moving your furniture around.  If any of your floors are uneven, fix them.  If there are any pets around you, be aware of where they are.  Review your medicines with your doctor. Some medicines can make you feel dizzy. This can increase your chance of falling. Ask your doctor what other things that you can do to help prevent falls. This information is not intended to replace advice given to you by your health care provider. Make sure you discuss any questions you have with your health care provider. Document Released: 08/20/2009 Document Revised: 03/31/2016 Document Reviewed: 11/28/2014 Elsevier Interactive Patient Education  2017 [employer].

## 2020-05-05 ENCOUNTER — Telehealth: Payer: Self-pay | Admitting: Family Medicine

## 2020-05-05 ENCOUNTER — Other Ambulatory Visit: Payer: Self-pay

## 2020-05-05 ENCOUNTER — Encounter: Payer: Self-pay | Admitting: Family Medicine

## 2020-05-05 DIAGNOSIS — I10 Essential (primary) hypertension: Secondary | ICD-10-CM

## 2020-05-05 MED ORDER — NITROGLYCERIN 0.4 MG SL SUBL: SUBLINGUAL_TABLET | SUBLINGUAL | 2 refills | Status: AC

## 2020-05-05 MED ORDER — LOSARTAN POTASSIUM 25 MG PO TABS: ORAL_TABLET | ORAL | 3 refills | Status: DC

## 2020-05-05 NOTE — Telephone Encounter (Signed)
I would love to help! I have forwarded this information on to my scheduling assistant to get her scheduled as soon as possible.   Please let me know if I can assist with anything further!  De [place], PharmD Clinical Pharmacist [facility] at [facility] [phone]

## 2020-05-05 NOTE — Telephone Encounter (Signed)
-----   Message from Dennis Bast, RN sent at 05/04/2020  3:20 PM EDT ----- Regarding: DM meds Pt states she can not continue to afford Victoza  and Januvia. Would like to know if there are other options.   Thanks [employer]

## 2020-05-06 ENCOUNTER — Telehealth: Payer: Self-pay | Admitting: Family Medicine

## 2020-05-06 NOTE — Progress Notes (Signed)
°  Chronic Care Management   Note  05/06/2020 Name: LAIA WILEY MRN: [id number] DOB: 1951/08/31  Shelda Jakes Inclan is a 69 y.o. year old female who is a primary care patient of Copland, Gay Filler, MD. I reached out to [name] by phone today in response to a referral sent by Ms. Shelda Jakes Wyke's PCP, Copland, Gay Filler, MD.   Ms. Khalsa was given information about Chronic Care Management services today including:  1. CCM service includes personalized support from designated clinical staff supervised by her physician, including individualized plan of care and coordination with other care providers 2. 24/7 contact phone numbers for assistance for urgent and routine care needs. 3. Service will only be billed when office clinical staff spend 20 minutes or more in a month to coordinate care. 4. Only one practitioner may furnish and bill the service in a calendar month. 5. The patient may stop CCM services at any time (effective at the end of the month) by phone call to the office staff.   Patient agreed to services and verbal consent obtained.  This note is not being shared with the patient for the following reason: To respect privacy (The patient or proxy has requested that the information not be shared).  Follow up plan:  Earney [place] [facility] Scheduler

## 2020-05-16 IMAGING — MG DIGITAL SCREENING BILATERAL MAMMOGRAM WITH TOMO AND CAD
6 of 12 series · 6 of 36 positions shown · non-contrast
Comparison: Previous exam(s).

CLINICAL DATA: Screening.

EXAM:
DIGITAL SCREENING BILATERAL MAMMOGRAM WITH TOMO AND CAD

[R CC synth-2D]
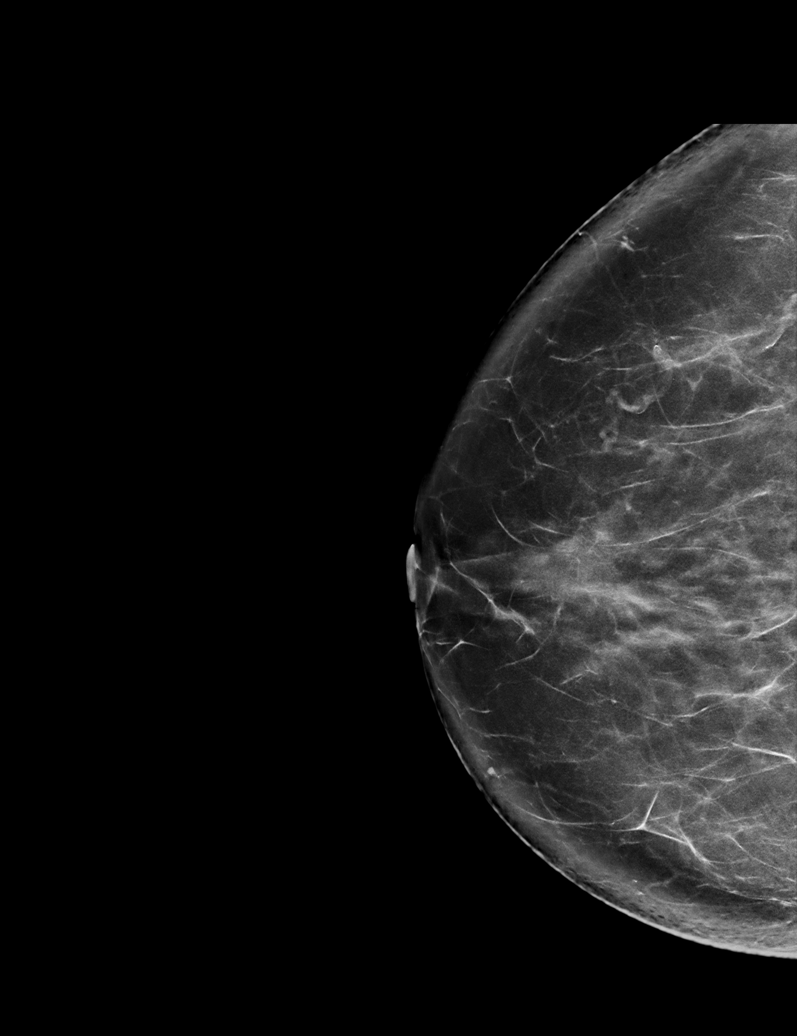

[L CC synth-2D]
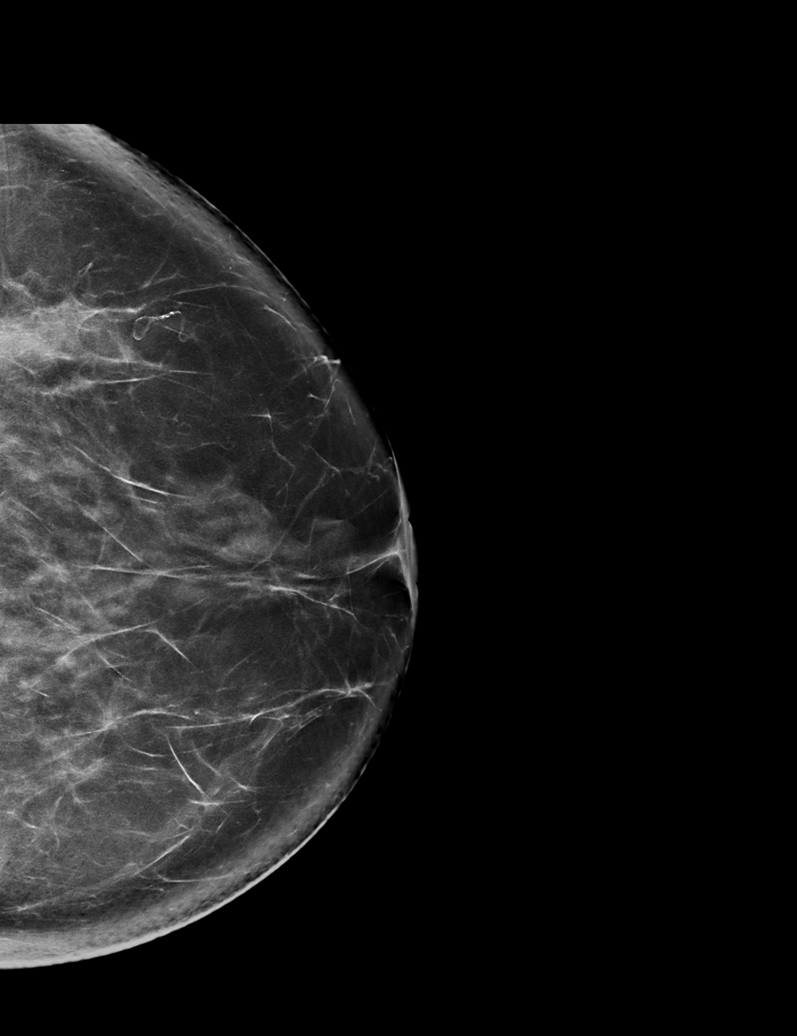

[L MLO synth-2D (1 of 2)]
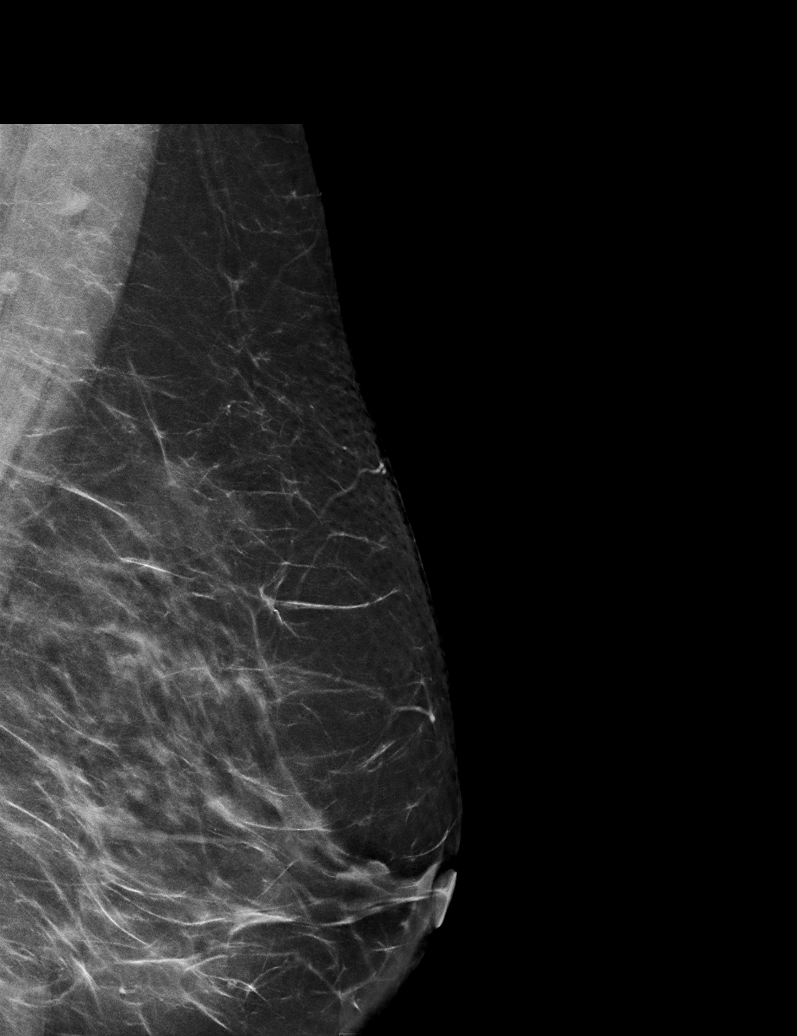

[R MLO synth-2D (1 of 2)]
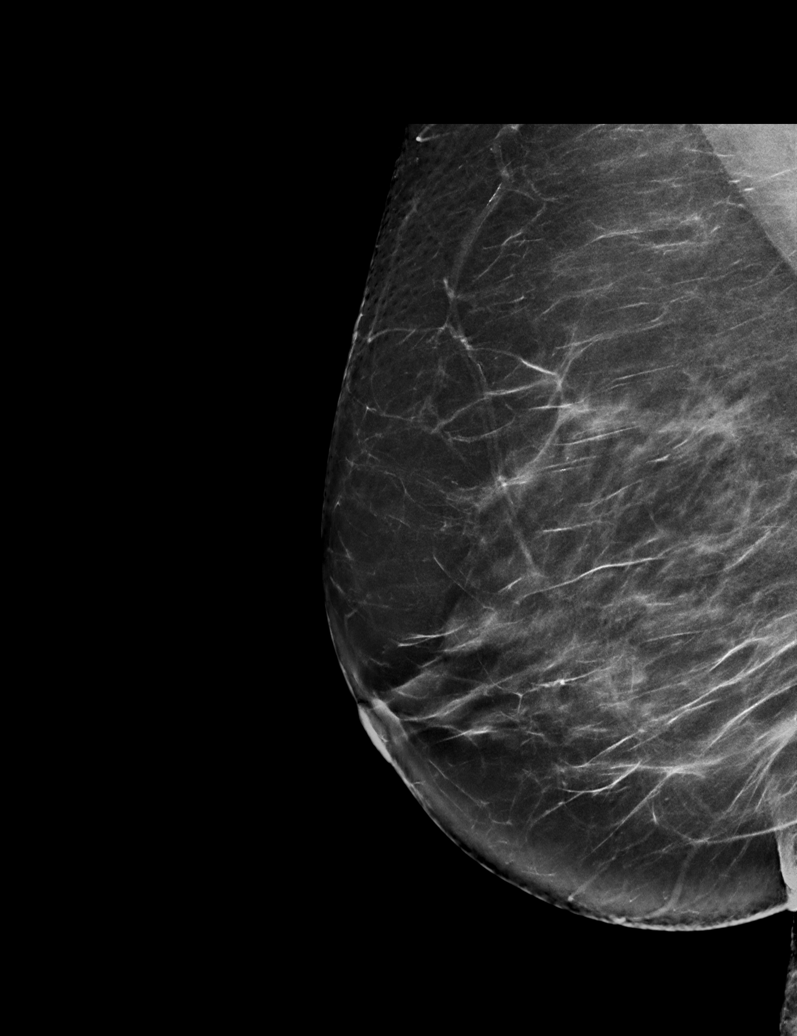

[R MLO synth-2D (2 of 2)]
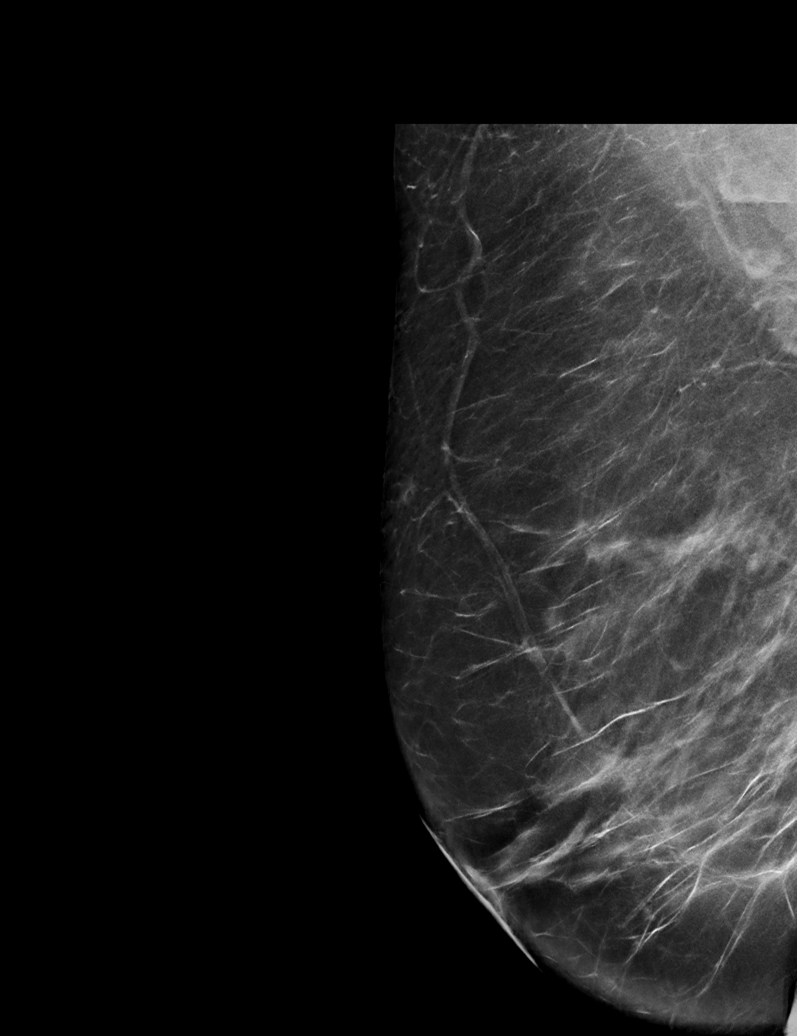

[L MLO synth-2D (2 of 2)]
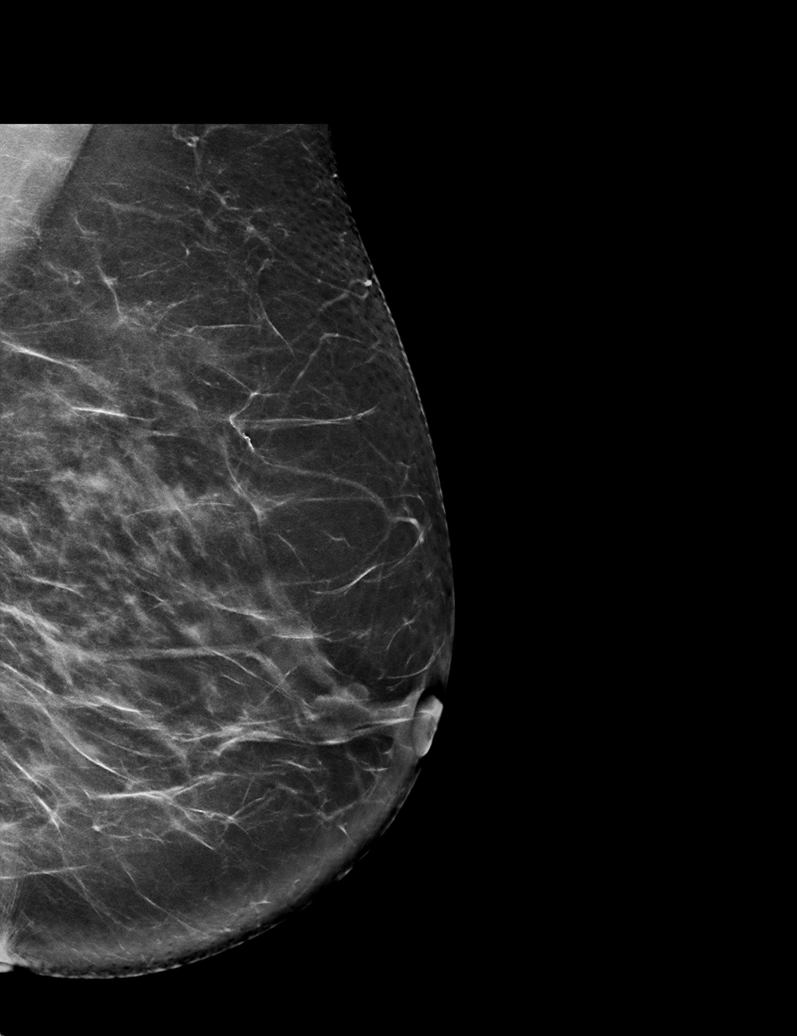

[6 of 36 positions shown; findings below may reference images not displayed]

ACR Breast Density Category c: The breast tissue is heterogeneously
dense, which may obscure small masses.
FINDINGS: There are no findings suspicious for malignancy. Images were
processed with CAD.
IMPRESSION: No mammographic evidence of malignancy. A result letter of this
screening mammogram will be mailed directly to the patient.

RECOMMENDATION:
Screening mammogram in one year. (Code:[id number])

BI-RADS CATEGORY  1: Negative.

## 2020-05-18 ENCOUNTER — Other Ambulatory Visit: Payer: Self-pay

## 2020-05-18 ENCOUNTER — Other Ambulatory Visit: Payer: Self-pay | Admitting: Family Medicine

## 2020-05-18 DIAGNOSIS — E119 Type 2 diabetes mellitus without complications: Secondary | ICD-10-CM

## 2020-05-18 DIAGNOSIS — E785 Hyperlipidemia, unspecified: Secondary | ICD-10-CM

## 2020-05-18 DIAGNOSIS — F4323 Adjustment disorder with mixed anxiety and depressed mood: Secondary | ICD-10-CM

## 2020-05-18 DIAGNOSIS — F419 Anxiety disorder, unspecified: Secondary | ICD-10-CM

## 2020-05-19 ENCOUNTER — Telehealth: Payer: Self-pay | Admitting: Family Medicine

## 2020-05-19 NOTE — Progress Notes (Signed)
  Chronic Care Management   Outreach Note  05/19/2020 Name: BOBBE QUILTER MRN: [id number] DOB: January 29, 1951  Referred by: Darreld Mclean, MD Reason for referral : No chief complaint on file.   An unsuccessful telephone outreach was attempted today. The patient was referred to the pharmacist for assistance with care management and care coordination.   Follow Up Plan:   Earney [place] Upstream Scheduler

## 2020-05-22 ENCOUNTER — Telehealth: Payer: PPO

## 2020-06-09 ENCOUNTER — Telehealth: Payer: PPO

## 2020-06-12 ENCOUNTER — Ambulatory Visit: Payer: PPO | Admitting: Pharmacist

## 2020-06-12 ENCOUNTER — Other Ambulatory Visit: Payer: Self-pay

## 2020-06-12 DIAGNOSIS — E785 Hyperlipidemia, unspecified: Secondary | ICD-10-CM

## 2020-06-12 DIAGNOSIS — E119 Type 2 diabetes mellitus without complications: Secondary | ICD-10-CM

## 2020-06-12 DIAGNOSIS — I10 Essential (primary) hypertension: Secondary | ICD-10-CM

## 2020-06-12 NOTE — Patient Instructions (Signed)
Visit Information  Goals Addressed            This Visit's Progress   . Chronic Care Management Pharmacy Care Plan       CARE PLAN ENTRY (see longitudinal plan of care for additional care plan information)  Current Barriers:  . Chronic Disease Management support, education, and care coordination needs related to Diabetes, Hypertension, Hyperlipidemia, Chest Pain, Anxiety, Tremor, Insomnia, GERD   Hypertension BP Readings from Last 3 Encounters:  05/04/20 128/76  02/24/20 130/84  10/23/19 130/82   . Pharmacist Clinical Goal(s): o Over the next 90 days, patient will work with PharmD and providers to maintain BP goal <140/90 . Current regimen:  o Losartan 25mg  daily . Patient self care activities - Over the next 90 days, patient will: o Maintain hypertension medication regimen.  Hyperlipidemia Lab Results  Component Value Date/Time   LDLCALC 76 02/24/2020 03:52 PM   . Pharmacist Clinical Goal(s): o Over the next 90 days, patient will work with PharmD and providers to maintain LDL goal < 100 . Current regimen:  o Atorvastatin 20mg  daily . Patient self care activities - Over the next 90 days, patient will: o Maintain cholesterol medication regimen.  Diabetes Lab Results  Component Value Date/Time   HGBA1C 7.9 (H) 02/24/2020 03:52 PM   HGBA1C 8.3 (H) 10/23/2019 02:41 PM   . Pharmacist Clinical Goal(s): o Over the next 90 days, patient will work with PharmD and providers to achieve A1c goal <7% . Current regimen:  o Victoza 1.8mg  daily . Interventions: o D/C Januvia o Consistently take Victoza 1.8mg  daily o Switch to Ozempic when Victoza completed o Complete PAP for Ozempic . Patient self care activities - Over the next 90 days, patient will: o Check blood sugar twice daily, document, and provide at future appointments o Contact provider with any episodes of hypoglycemia o Complete portions of PAP necessary to submit for Ozempic  Medication  management . Pharmacist Clinical Goal(s): o Over the next 90 days, patient will work with PharmD and providers to achieve optimal medication adherence . Current pharmacy: [facility] . Interventions o Comprehensive medication review performed. o Continue current medication management strategy . Patient self care activities - Over the next 90 days, patient will: o Focus on medication adherence by filling and taking medications appropriately  o Take medications as prescribed o Report any questions or concerns to PharmD and/or provider(s)  Initial goal documentation        Ms. Clodfelter was given information about Chronic Care Management services today including:  1. CCM service includes personalized support from designated clinical staff supervised by her physician, including individualized plan of care and coordination with other care providers 2. 24/7 contact phone numbers for assistance for urgent and routine care needs. 3. Standard insurance, coinsurance, copays and deductibles apply for chronic care management only during months in which we provide at least 20 minutes of these services. Most insurances cover these services at 100%, however patients may be responsible for any copay, coinsurance and/or deductible if applicable. This service may help you avoid the need for more expensive face-to-face services. 4. Only one practitioner may furnish and bill the service in a calendar month. 5. The patient may stop CCM services at any time (effective at the end of the month) by phone call to the office staff.  Patient agreed to services and verbal consent obtained.   The patient verbalized understanding of instructions provided today and agreed to receive a mailed copy of patient instruction and/or  Scientist, clinical (histocompatibility and immunogenetics). Telephone follow up appointment with pharmacy team member scheduled for: 06/26/2020  Melvenia Beam Sajan Cheatwood, PharmD Clinical Pharmacist [facility] [phone]   Carbohydrate Counting for Diabetes Mellitus, Adult  Carbohydrate counting is a method of keeping track of how many carbohydrates you eat. Eating carbohydrates naturally increases the amount of sugar (glucose) in the blood. Counting how many carbohydrates you eat helps keep your blood glucose within normal limits, which helps you manage your diabetes (diabetes mellitus). It is important to know how many carbohydrates you can safely have in each meal. This is different for every person. A diet and nutrition specialist (registered dietitian) can help you make a meal plan and calculate how many carbohydrates you should have at each meal and snack. Carbohydrates are found in the following foods:  Grains, such as breads and cereals.  Dried beans and soy products.  Starchy vegetables, such as potatoes, peas, and corn.  Fruit and fruit juices.  Milk and yogurt.  Sweets and snack foods, such as cake, cookies, candy, chips, and soft drinks. How do I count carbohydrates? There are two ways to count carbohydrates in food. You can use either of the methods or a combination of both. Reading "Nutrition Facts" on packaged food The "Nutrition Facts" list is included on the labels of almost all packaged foods and beverages in the [place] It includes:  The serving size.  Information about nutrients in each serving, including the grams (g) of carbohydrate per serving. To use the "Nutrition Facts":  Decide how many servings you will have.  Multiply the number of servings by the number of carbohydrates per serving.  The resulting number is the total amount of carbohydrates that you will be having. Learning standard serving sizes of other foods When you eat carbohydrate foods that are not packaged or do not include "Nutrition Facts" on the label, you need to measure the servings in order to count the amount of carbohydrates:  Measure the foods that you will eat with a food scale or  measuring cup, if needed.  Decide how many standard-size servings you will eat.  Multiply the number of servings by 15. Most carbohydrate-rich foods have about 15 g of carbohydrates per serving. ? For example, if you eat 8 oz (170 g) of strawberries, you will have eaten 2 servings and 30 g of carbohydrates (2 servings x 15 g = 30 g).  For foods that have more than one food mixed, such as soups and casseroles, you must count the carbohydrates in each food that is included. The following list contains standard serving sizes of common carbohydrate-rich foods. Each of these servings has about 15 g of carbohydrates:   hamburger bun or  English muffin.   oz (15 mL) syrup.   oz (14 g) jelly.  1 slice of bread.  1 six-inch tortilla.  3 oz (85 g) cooked rice or pasta.  4 oz (113 g) cooked dried beans.  4 oz (113 g) starchy vegetable, such as peas, corn, or potatoes.  4 oz (113 g) hot cereal.  4 oz (113 g) mashed potatoes or  of a large baked potato.  4 oz (113 g) canned or frozen fruit.  4 oz (120 mL) fruit juice.  4-6 crackers.  6 chicken nuggets.  6 oz (170 g) unsweetened dry cereal.  6 oz (170 g) plain fat-free yogurt or yogurt sweetened with artificial sweeteners.  8 oz (240 mL) milk.  8 oz (170 g) fresh fruit or  one small piece of fruit.  24 oz (680 g) popped popcorn. Example of carbohydrate counting Sample meal  3 oz (85 g) chicken breast.  6 oz (170 g) brown rice.  4 oz (113 g) corn.  8 oz (240 mL) milk.  8 oz (170 g) strawberries with sugar-free whipped topping. Carbohydrate calculation 1. Identify the foods that contain carbohydrates: ? Rice. ? Corn. ? Milk. ? Strawberries. 2. Calculate how many servings you have of each food: ? 2 servings rice. ? 1 serving corn. ? 1 serving milk. ? 1 serving strawberries. 3. Multiply each number of servings by 15 g: ? 2 servings rice x 15 g = 30 g. ? 1 serving corn x 15 g = 15 g. ? 1 serving milk x 15 g =  15 g. ? 1 serving strawberries x 15 g = 15 g. 4. Add together all of the amounts to find the total grams of carbohydrates eaten: ? 30 g + 15 g + 15 g + 15 g = 75 g of carbohydrates total. Summary  Carbohydrate counting is a method of keeping track of how many carbohydrates you eat.  Eating carbohydrates naturally increases the amount of sugar (glucose) in the blood.  Counting how many carbohydrates you eat helps keep your blood glucose within normal limits, which helps you manage your diabetes.  A diet and nutrition specialist (registered dietitian) can help you make a meal plan and calculate how many carbohydrates you should have at each meal and snack. This information is not intended to replace advice given to you by your health care provider. Make sure you discuss any questions you have with your health care provider. Document Revised: 05/18/2017 Document Reviewed: 04/06/2016 Elsevier Patient Education  [place].

## 2020-06-12 NOTE — Chronic Care Management (AMB) (Signed)
Chronic Care Management Pharmacy  Name: [name]  MRN: [id number] DOB: [birth year]   Chief Complaint/ HPI  Abigail [name],  69 y.o. , female presents for their Initial CCM visit with the clinical pharmacist via telephone due to COVID-19 Pandemic.  PCP : Darreld Mclean, MD  Their chronic conditions include: Diabetes, Hypertension, Hyperlipidemia, Chest Pain, Anxiety, Tremor, Insomnia, GERD  Office Visits: 05/04/2020: Medicare Annual Wellness Exam w/ Abigail Plummer, RN - Celesta Gentile and Vicotza expensive to afford.   02/24/20: Visit w/ Dr. Lorelei Pont - DXA performed per GYN. No med changes noted  Consult Visit: 03/09/20: Nephrology visit w/ Dr. Moshe Cipro  01/22/20: Derm visit w/ Dr. Fontaine No  Medications: Outpatient Encounter Medications as of 06/12/2020  Medication Sig Note  . Ascorbic Acid (VITAMIN C) 1000 MG tablet Take 1,000 mg by mouth daily.   [name] atorvastatin (LIPITOR) 20 MG tablet TAKE 1 TABLET(20 MG) BY MOUTH DAILY   . liraglutide (VICTOZA) 18 MG/3ML SOPN INJECT 1.8 MG INTO THE SKIN DAILY   . losartan (COZAAR) 25 MG tablet TAKE 1 TABLET(25 MG) BY MOUTH DAILY   . Multiple Vitamins-Minerals (ALIVE WOMENS GUMMY) CHEW Chew 2 tablets by mouth daily.   [name] omeprazole (PRILOSEC) 20 MG capsule TAKE 1 CAPSULE(20 MG) BY MOUTH DAILY   . propranolol (INDERAL) 10 MG tablet TAKE 1 TABLET BY MOUTH EVERY 8-12 HOURS AS NEEDED FOR TREMOR 06/12/2020: Using twice daily  . traZODone (DESYREL) 150 MG tablet Take 1 tablet (150 mg total) by mouth at bedtime.   [name] venlafaxine XR (EFFEXOR-XR) 150 MG 24 hr capsule Take 1 capsule (150 mg total) by mouth daily with breakfast.   . zinc gluconate 50 MG tablet Take 50 mg by mouth daily.   . Accu-Chek FastClix Lancets MISC USE TO TEST BLOOD SUGAR UP TO FOUR TIMES DAILY AS DIRECTED   . blood glucose meter kit and supplies Dispense based on patient and insurance preference. Use up to four times daily as directed. (FOR ICD-10 E10.9, E11.9).   [name]  glucose blood (ONETOUCH VERIO) test strip TEST BLOOD SUGARS TWICE DAILY.   . nitroGLYCERIN (NITROSTAT) 0.4 MG SL tablet PLACE 1 TABLET UNDER THE TONGUE EVERY 5 MINUTES AS NEEDED FOR CHEST PAIN 06/12/2020: Has not had to use, but has it just in case  . sitaGLIPtin (JANUVIA) 50 MG tablet Take 1 tablet (50 mg total) by mouth daily. (Patient not taking: Reported on 06/12/2020) 06/12/2020: Has been out for 3 weeks   No facility-administered encounter medications on file as of 06/12/2020.   SDOH Screenings   Alcohol Screen:   . Last Alcohol Screening Score (AUDIT):   Depression (PHQ2-9): Low Risk   . PHQ-2 Score: 0  Financial Resource Strain: Medium Risk  . Difficulty of Paying Living Expenses: Somewhat hard  Food Insecurity: No Food Insecurity  . Worried About Charity fundraiser in the Last Year: Never true  . Ran Out of Food in the Last Year: Never true  Housing: Low Risk   . Last Housing Risk Score: 0  Physical Activity:   . Days of Exercise per Week:   . Minutes of Exercise per Session:   Social Connections:   . Frequency of Communication with Friends and Family:   . Frequency of Social Gatherings with Friends and Family:   . Attends Religious Services:   . Active Member of Clubs or Organizations:   . Attends Archivist Meetings:   [name] Marital Status:   Stress:   . Feeling of  Stress :   Tobacco Use: Low Risk   . Smoking Tobacco Use: Never Smoker  . Smokeless Tobacco Use: Never Used  Transportation Needs: No Transportation Needs  . Lack of Transportation (Medical): No  . Lack of Transportation (Non-Medical): No     Current Diagnosis/Assessment:  Goals Addressed            This Visit's Progress   . Chronic Care Management Pharmacy Care Plan       CARE PLAN ENTRY (see longitudinal plan of care for additional care plan information)  Current Barriers:  . Chronic Disease Management support, education, and care coordination needs related to Diabetes, Hypertension,  Hyperlipidemia, Chest Pain, Anxiety, Tremor, Insomnia, GERD   Hypertension BP Readings from Last 3 Encounters:  05/04/20 128/76  02/24/20 130/84  10/23/19 130/82   . Pharmacist Clinical Goal(s): o Over the next 90 days, patient will work with PharmD and providers to maintain BP goal <140/90 . Current regimen:  o Losartan 47m daily . Patient self care activities - Over the next 90 days, patient will: o Maintain hypertension medication regimen.  Hyperlipidemia Lab Results  Component Value Date/Time   LDLCALC 76 02/24/2020 03:52 PM   . Pharmacist Clinical Goal(s): o Over the next 90 days, patient will work with PharmD and providers to maintain LDL goal < 100 . Current regimen:  o Atorvastatin 277mdaily . Patient self care activities - Over the next 90 days, patient will: o Maintain cholesterol medication regimen.  Diabetes Lab Results  Component Value Date/Time   HGBA1C 7.9 (H) 02/24/2020 03:52 PM   HGBA1C 8.3 (H) 10/23/2019 02:41 PM   . Pharmacist Clinical Goal(s): o Over the next 90 days, patient will work with PharmD and providers to achieve A1c goal <7% . Current regimen:  o Victoza 1.51m66maily . Interventions: o D/C Januvia o Consistently take Victoza 1.51mg29mily o Switch to Ozempic when Victoza completed o Complete PAP for Ozempic . Patient self care activities - Over the next 90 days, patient will: o Check blood sugar twice daily, document, and provide at future appointments o Contact provider with any episodes of hypoglycemia o Complete portions of PAP necessary to submit for Ozempic  Medication management . Pharmacist Clinical Goal(s): o Over the next 90 days, patient will work with PharmD and providers to achieve optimal medication adherence . Current pharmacy: [facility] . Interventions o Comprehensive medication review performed. o Continue current medication management strategy . Patient self care activities - Over the next 90 days, patient  will: o Focus on medication adherence by filling and taking medications appropriately  o Take medications as prescribed o Report any questions or concerns to PharmD and/or provider(s)  Initial goal documentation      Takes care of her mom who is 90. 62st husband in 2020 due to sepsis.  She was married 50 y63rs.  She has a daughter and son.  Has 4 grandchildren.  Fills box up every 4 weeks.   Diabetes   A1c goal <7%  FBG 80-130  Recent Relevant Labs: Lab Results  Component Value Date/Time   HGBA1C 7.9 (H) 02/24/2020 03:52 PM   HGBA1C 8.3 (H) 10/23/2019 02:41 PM   GFR 33.51 (L) 02/24/2020 03:52 PM   GFR 38.10 (L) 10/23/2019 02:41 PM   MICROALBUR 3.1 (H) 06/03/2019 10:07 AM   MICROALBUR 6.7 (H) 10/25/2016 08:29 AM    Last diabetic Eye exam: No results found for: HMDIABEYEEXA  Last diabetic Foot exam: No results found for: HMDIABFOOTEX  Checking BG: 2x per Nevan Creighton  Recent FBG Readings: 180-200 yesterday at the lowest BG of 98. Lowest it's been in a long time. Highest about 215.   Patient has failed these meds in past: metformin (renal fx decline) Patient is currently uncontrolled on the following medications: . Januvia 36m daily  . Victoza 1.860mdaily (patient only taking 1.92m93most days. Will use 1.8mg592men she feels BG is high)  Called [employer] confirmed that all Tier 3 medications including Victoza, Ozempic, and Jardiance (FarWilder Gladenon-formulary on her plan) are $45 for 30 DS $90 for 60 DS and 90 DS  Patient would benefit from filling for a 90 DS at the same cost as a $60 DS.  Victoza Fill Dates 04/20/20 60 DS 02/20/20 60 DS  Has 2 pens remaining (~20 Dreana Britz supply)  Januvia Fill Dates 02/03/20 90 DS States this one was costing about $400 Is completely out. Has been out for about 3 weeks  States she has a family hx of diabetes. She also gets very sad about the passing of her husband and tends to eat more.  In 2016 states she was around  130lbs and felt that was too thin.  She is now around 153 and states she would like to be 10-15lbs lighter.   We discussed:  DM goals  How to dose ozempic (0.25mg592mkly x 4 weeks then move to 0.5mg w68mly)  No need to refill Januvia  Will complete appropriate portions of the PAP for Ozempic. Will place patient portion up front for her to sign at her earliest convenience. Also requested that patient provide proof of income. Will provide portions for Dr. CoplanLorelei Pontgn. Will fax completed applications when all portions completed and returned.  Plan -Discontinue Januvia as DPP4 and GLP1 (Victoza) not recommended to take at same time -Increase Victoza to 1.8mg da53m -Switch to Ozempic once done with Victoza -Complete patient assistance paperwork for Ozempic (provide patient with sample if PAP not approved by the time she runs out) -Follow up in 2 weeks for further assessment and coordination with PAP  Future Plan -If BG not at goal on max dose of Ozempic, consider completing PAP for patient to get FarxigaWilder Glade it's indication for patients with CKD. Also note that this could be added without regard to a1c, but also note this would increase pill burden. Will have discussion in future.  Hypertension   BP goal is:  <140/90  Office blood pressures are  BP Readings from Last 3 Encounters:  05/04/20 128/76  02/24/20 130/84  10/23/19 130/82   Patient checks BP at home infrequently Patient home BP readings are ranging: Unable to assess  Patient has failed these meds in the past: triamterene-hctz (listed in D/C meds. No apparent reason for D/C) Patient is currently controlled on the following medications:  . Losartan 25mg da38m Fill Date 05/05/20 90 DS  Plan -Continue current medications     Hyperlipidemia   LDL goal <100  Lipid Panel     Component Value Date/Time   CHOL 173 02/24/2020 1552   TRIG 199.0 (H) 02/24/2020 1552   HDL 56.60 02/24/2020 1552   LDLCALC 76  02/24/2020 1552    Hepatic Function Latest Ref Rng & Units 10/23/2019 06/03/2019 02/01/2018  Total Protein 6.0 - 8.3 g/dL 6.6 7.0 6.8  Albumin 3.5 - 5.2 g/dL 4.3 4.5 3.9  AST 0 - 37 U/L 13 13 12   ALT 0 - 35 U/L 11 10 10   Alk  Phosphatase 39 - 117 U/L 97 107 66  Total Bilirubin 0.2 - 1.2 mg/dL 0.3 0.4 0.3     The 10-year ASCVD risk score Mikey Bussing DC Jr., et al., 2013) is: 22%   Values used to calculate the score:     Age: 59 years     Sex: Female     Is Non-Hispanic African American: No     Diabetic: Yes     Tobacco smoker: No     Systolic Blood Pressure: 791 mmHg     Is BP treated: Yes     HDL Cholesterol: 56.6 mg/dL     Total Cholesterol: 173 mg/dL   Patient has failed these meds in past: None noted  Patient is currently controlled on the following medications:  . Atorvastatin 80m daily  Fill Dates 05/02/20 90 DS 02/02/20 90 DS  Plan -Continue current medications   Chest Pain    Patient has failed these meds in past: None noted  Patient is currently controlled on the following medications:  Nitroglycerin 0.449mSL as needed  Denies any chest pain  Plan -Continue current medications   Anxiety    Patient has failed these meds in past: Fluoxetine (duplicaton?) Patient is currently controlled on the following medications:  Venlafaxine ER 15035maily Breakfast  Fill Dates 05/18/20 90 DS 04/19/20 30 DS 03/16/20 30 DS 02/17/20 30 DS  Plan -Continue current medications   Tremor    Patient has failed these meds in past: None noted  Patient is currently controlled on the following medications:   Propranolol 83m47mery 8-12 hours as needed (uses twice daily)  Plan -Continue current medications   Insomnia    Patient has failed these meds in past: None noted  Patient is currently controlled on the following medications:  Trazodone 150mg78mly at bedtime  Has been taking since 1988 when she lost her sister.  About once a week she doesn't go to sleep until  2-2:30am.  Feels like she can't get her mind to calm down, but gets adequate sleep most nights of the week.  Plan -Continue current medications   GERD    Patient has failed these meds in past: None noted  Patient is currently controlled on the following medications:  Omeprazole 20mg 51my  Plan -Continue current medications   Miscellaneous Meds  Zinc gluconate 50mg M8mvitamin Vitamin C Vitamin K Vitamin D  [facility] Patient may benefit from adherence packaging as she has to care for her mother. This could reduce her medication burden. Will offer at follow up on 06/26/20. Her mom may be eligible and benefit from CCM services as well.

## 2020-06-26 ENCOUNTER — Ambulatory Visit: Payer: PPO | Admitting: Pharmacist

## 2020-06-26 DIAGNOSIS — E785 Hyperlipidemia, unspecified: Secondary | ICD-10-CM

## 2020-06-26 DIAGNOSIS — L570 Actinic keratosis: Secondary | ICD-10-CM | POA: Diagnosis not present

## 2020-06-26 DIAGNOSIS — Z85828 Personal history of other malignant neoplasm of skin: Secondary | ICD-10-CM | POA: Diagnosis not present

## 2020-06-26 DIAGNOSIS — D225 Melanocytic nevi of trunk: Secondary | ICD-10-CM | POA: Diagnosis not present

## 2020-06-26 DIAGNOSIS — D692 Other nonthrombocytopenic purpura: Secondary | ICD-10-CM | POA: Diagnosis not present

## 2020-06-26 DIAGNOSIS — E119 Type 2 diabetes mellitus without complications: Secondary | ICD-10-CM

## 2020-06-26 DIAGNOSIS — L821 Other seborrheic keratosis: Secondary | ICD-10-CM | POA: Diagnosis not present

## 2020-06-26 DIAGNOSIS — L814 Other melanin hyperpigmentation: Secondary | ICD-10-CM | POA: Diagnosis not present

## 2020-06-26 NOTE — Chronic Care Management (AMB) (Signed)
 Chronic Care Management Pharmacy  Name: Abigail [name]  MRN: [id number] DOB: 02/17/1951   Chief Complaint/ HPI  Abigail [name],  69 y.o. , female presents for their Follow-Up CCM visit with the clinical pharmacist via telephone due to COVID-19 Pandemic.  PCP : Copland, Jessica C, MD  Their chronic conditions include: Diabetes, Hypertension, Hyperlipidemia, Chest Pain, Anxiety, Tremor, Insomnia, GERD  Office Visits: None since last CCM visit on 06/12/20.   Consult Visit: None since last CCM visit on 06/12/20.   Medications: Outpatient Encounter Medications as of 06/26/2020  Medication Sig Note  . Accu-Chek FastClix Lancets MISC USE TO TEST BLOOD SUGAR UP TO FOUR TIMES DAILY AS DIRECTED   . Ascorbic Acid (VITAMIN C) 1000 MG tablet Take 1,000 mg by mouth daily.   . atorvastatin (LIPITOR) 20 MG tablet TAKE 1 TABLET(20 MG) BY MOUTH DAILY   . blood glucose meter kit and supplies Dispense based on patient and insurance preference. Use up to four times daily as directed. (FOR ICD-10 E10.9, E11.9).   . glucose blood (ONETOUCH VERIO) test strip TEST BLOOD SUGARS TWICE DAILY.   . liraglutide (VICTOZA) 18 MG/3ML SOPN INJECT 1.8 MG INTO THE SKIN DAILY   . losartan (COZAAR) 25 MG tablet TAKE 1 TABLET(25 MG) BY MOUTH DAILY   . Multiple Vitamins-Minerals (ALIVE WOMENS GUMMY) CHEW Chew 2 tablets by mouth daily.   . nitroGLYCERIN (NITROSTAT) 0.4 MG SL tablet PLACE 1 TABLET UNDER THE TONGUE EVERY 5 MINUTES AS NEEDED FOR CHEST PAIN 06/12/2020: Has not had to use, but has it just in case  . omeprazole (PRILOSEC) 20 MG capsule TAKE 1 CAPSULE(20 MG) BY MOUTH DAILY   . propranolol (INDERAL) 10 MG tablet TAKE 1 TABLET BY MOUTH EVERY 8-12 HOURS AS NEEDED FOR TREMOR 06/12/2020: Using twice daily  . sitaGLIPtin (JANUVIA) 50 MG tablet Take 1 tablet (50 mg total) by mouth daily. (Patient not taking: Reported on 06/12/2020) 06/12/2020: Has been out for 3 weeks  . traZODone (DESYREL) 150 MG tablet Take 1 tablet  (150 mg total) by mouth at bedtime.   . venlafaxine XR (EFFEXOR-XR) 150 MG 24 hr capsule Take 1 capsule (150 mg total) by mouth daily with breakfast.   . zinc gluconate 50 MG tablet Take 50 mg by mouth daily.    No facility-administered encounter medications on file as of 06/26/2020.   SDOH Screenings   Alcohol Screen:   . Last Alcohol Screening Score (AUDIT): Not on file  Depression (PHQ2-9): Low Risk   . PHQ-2 Score: 0  Financial Resource Strain: Medium Risk  . Difficulty of Paying Living Expenses: Somewhat hard  Food Insecurity: No Food Insecurity  . Worried About Running Out of Food in the Last Year: Never true  . Ran Out of Food in the Last Year: Never true  Housing: Low Risk   . Last Housing Risk Score: 0  Physical Activity:   . Days of Exercise per Week: Not on file  . Minutes of Exercise per Session: Not on file  Social Connections:   . Frequency of Communication with Friends and Family: Not on file  . Frequency of Social Gatherings with Friends and Family: Not on file  . Attends Religious Services: Not on file  . Active Member of Clubs or Organizations: Not on file  . Attends Club or Organization Meetings: Not on file  . Marital Status: Not on file  Stress:   . Feeling of Stress : Not on file  Tobacco Use: Low Risk   .   Smoking Tobacco Use: Never Smoker  . Smokeless Tobacco Use: Never Used  Transportation Needs: No Transportation Needs  . Lack of Transportation (Medical): No  . Lack of Transportation (Non-Medical): No     Current Diagnosis/Assessment:  Goals Addressed            This Visit's Progress   . Chronic Care Management Pharmacy Care Plan       CARE PLAN ENTRY (see longitudinal plan of care for additional care plan information)  Current Barriers:  . Chronic Disease Management support, education, and care coordination needs related to Diabetes, Hypertension, Hyperlipidemia, Chest Pain, Anxiety, Tremor, Insomnia, GERD   Hypertension BP Readings from  Last 3 Encounters:  05/04/20 128/76  02/24/20 130/84  10/23/19 130/82   . Pharmacist Clinical Goal(s): o Over the next 90 days, patient will work with PharmD and providers to maintain BP goal <140/90 . Current regimen:  o Losartan 25mg daily . Patient self care activities - Over the next 90 days, patient will: o Maintain hypertension medication regimen.  Hyperlipidemia Lab Results  Component Value Date/Time   LDLCALC 76 02/24/2020 03:52 PM   . Pharmacist Clinical Goal(s): o Over the next 90 days, patient will work with PharmD and providers to maintain LDL goal < 100 . Current regimen:  o Atorvastatin 20mg daily . Patient self care activities - Over the next 90 days, patient will: o Maintain cholesterol medication regimen.  Diabetes Lab Results  Component Value Date/Time   HGBA1C 7.9 (H) 02/24/2020 03:52 PM   HGBA1C 8.3 (H) 10/23/2019 02:41 PM   . Pharmacist Clinical Goal(s): o Over the next 90 days, patient will work with PharmD and providers to achieve A1c goal <7% . Current regimen:  o Ozempic 0.25mg weekly for 4 weeks then increase to 0.5mg weekly . Interventions: o D/C Januvia (completed) o Consistently take Victoza 1.8mg daily (completed) o Switch to Ozempic when Victoza completed (completed) o Complete PAP for Ozempic (completed) . Patient self care activities - Over the next 90 days, patient will: o Check blood sugar twice daily, document, and provide at future appointments o Contact provider with any episodes of hypoglycemia o Complete portions of PAP necessary to submit for Ozempic  Medication management . Pharmacist Clinical Goal(s): o Over the next 90 days, patient will work with PharmD and providers to achieve optimal medication adherence . Current pharmacy: [facility] to [facility] . Interventions o Comprehensive medication review performed. o Utilize [facility] pharmacy for medication synchronization, packaging and delivery . Patient self care  activities - Over the next 90 days, patient will: o Focus on medication adherence by filling and taking medications appropriately  o Take medications as prescribed o Report any questions or concerns to PharmD and/or provider(s)  Please see past updates related to this goal by clicking on the "Past Updates" button in the selected goal       Takes care of her mom who is 90. Lost husband in 2020 due to sepsis.  She was married 50 years.  She has a daughter and son.  Has 4 grandchildren.  Fills box up every 4 weeks.   Diabetes   A1c goal <7%  FBG 80-130  Recent Relevant Labs: Lab Results  Component Value Date/Time   HGBA1C 7.9 (H) 02/24/2020 03:52 PM   HGBA1C 8.3 (H) 10/23/2019 02:41 PM   GFR 33.51 (L) 02/24/2020 03:52 PM   GFR 38.10 (L) 10/23/2019 02:41 PM   MICROALBUR 3.1 (H) 06/03/2019 10:07 AM   MICROALBUR 6.7 (H) 10/25/2016 08:29   AM    Last diabetic Eye exam: No results found for: HMDIABEYEEXA  Last diabetic Foot exam: No results found for: HMDIABFOOTEX   Checking BG: 2x per Day  Recent FBG Readings: 180-200 yesterday at the lowest BG of 98. Lowest it's been in a long time. Highest about 215.   Patient has failed these meds in past: metformin (renal fx decline) Patient is currently uncontrolled on the following medications:  Victoza 1.8mg daily   Called patient's insurance company and confirmed that all Tier 3 medications including Victoza, Ozempic, and Jardiance (Farxiga is non-formulary on her plan) are $45 for 30 DS $90 for 60 DS and 90 DS  Patient would benefit from filling for a 90 DS at the same cost as a $60 DS.  Victoza Fill Dates 04/20/20 60 DS 02/20/20 60 DS  Has 2 pens remaining (~20 day supply)  Januvia Fill Dates 02/03/20 90 DS States this one was costing about $400 Is completely out. Has been out for about 3 weeks  States she has a family hx of diabetes. She also gets very sad about the passing of her husband and tends to eat more.  In 2016  states she was around 130lbs and felt that was too thin.  She is now around 153 and states she would like to be 10-15lbs lighter.   We discussed:  DM goals  How to dose ozempic (0.25mg weekly x 4 weeks then move to 0.5mg weekly)  No need to refill Januvia  Will complete appropriate portions of the PAP for Ozempic. Will place patient portion up front for her to sign at her earliest convenience. Also requested that patient provide proof of income. Will provide portions for Dr. Copland to sign. Will fax completed applications when all portions completed and returned.  Update 06/26/20 Has moved up to Victoza 1.8mg daily.  Has been checking her BG twice daily. Very stressed about her husband's death anniversary.  Fasting Bedtime 168  165 184  193 262  218 182   188  190 243  230 168   219  220 191  223 238  188 170   197   170  225 Average 198.4  205.7   Plan -Switch to Ozempic once done with Victoza -Complete patient assistance paperwork for Ozempic (provide patient with sample if PAP not approved by the time she runs out - sample given, PAP faxed 07/06/20) -Ivey to follow up in 2 weeks -Kanesha to follow up in October.   Future Plan -If BG not at goal on max dose of Ozempic, consider completing PAP for patient to get Farxiga noting it's indication for patients with CKD. Also note that this could be added without regard to a1c, but also note this would increase pill burden. Will have discussion in future.    Hyperlipidemia   LDL goal <100  Lipid Panel     Component Value Date/Time   CHOL 173 02/24/2020 1552   TRIG 199.0 (H) 02/24/2020 1552   HDL 56.60 02/24/2020 1552   LDLCALC 76 02/24/2020 1552    Hepatic Function Latest Ref Rng & Units 10/23/2019 06/03/2019 02/01/2018  Total Protein 6.0 - 8.3 g/dL 6.6 7.0 6.8  Albumin 3.5 - 5.2 g/dL 4.3 4.5 3.9  AST 0 - 37 U/L 13 13 12  ALT 0 - 35 U/L 11 10 10  Alk Phosphatase 39 - 117 U/L 97 107 66  Total Bilirubin 0.2 - 1.2  mg/dL 0.3 0.4 0.3     The 10-year   ASCVD risk score Mikey Bussing DC Jr., et al., 2013) is: 22%   Values used to calculate the score:     Age: 76 years     Sex: Female     Is Non-Hispanic African American: No     Diabetic: Yes     Tobacco smoker: No     Systolic Blood Pressure: 800 mmHg     Is BP treated: Yes     HDL Cholesterol: 56.6 mg/dL     Total Cholesterol: 173 mg/dL   Patient has failed these meds in past: None noted  Patient is currently controlled on the following medications:  . Atorvastatin 42m daily  Fill Dates 05/02/20 90 DS 02/02/20 90 DS 05/02/20 90 DS  Update 06/26/20 Discussed limiting foods that would increase TRIG  Plan -Continue current medications    Miscellaneous Meds  Zinc gluconate 5106mMultivitamin Vitamin C 28253mitamin K2 100m73mtamin D 1000 units Vitamin B12 1000[facility]31[facility] Verbal consent obtained for [facility] enhanced pharmacy services (medication synchronization, adherence packaging, delivery coordination). A medication sync plan was created to allow patient to get all medications delivered once every 30 to 90 days per patient preference. Patient understands they have freedom to choose pharmacy and clinical pharmacist will coordinate care between all prescribers and [facility].

## 2020-07-06 NOTE — Patient Instructions (Addendum)
Visit Information  Goals Addressed            This Visit's Progress   . Chronic Care Management Pharmacy Care Plan       CARE PLAN ENTRY (see longitudinal plan of care for additional care plan information)  Current Barriers:  . Chronic Disease Management support, education, and care coordination needs related to Diabetes, Hypertension, Hyperlipidemia, Chest Pain, Anxiety, Tremor, Insomnia, GERD   Hypertension BP Readings from Last 3 Encounters:  05/04/20 128/76  02/24/20 130/84  10/23/19 130/82   . Pharmacist Clinical Goal(s): o Over the next 90 days, patient will work with PharmD and providers to maintain BP goal <140/90 . Current regimen:  o Losartan 25mg  daily . Patient self care activities - Over the next 90 days, patient will: o Maintain hypertension medication regimen.  Hyperlipidemia Lab Results  Component Value Date/Time   LDLCALC 76 02/24/2020 03:52 PM   . Pharmacist Clinical Goal(s): o Over the next 90 days, patient will work with PharmD and providers to maintain LDL goal < 100 . Current regimen:  o Atorvastatin 20mg  daily . Patient self care activities - Over the next 90 days, patient will: o Maintain cholesterol medication regimen.  Diabetes Lab Results  Component Value Date/Time   HGBA1C 7.9 (H) 02/24/2020 03:52 PM   HGBA1C 8.3 (H) 10/23/2019 02:41 PM   . Pharmacist Clinical Goal(s): o Over the next 90 days, patient will work with PharmD and providers to achieve A1c goal <7% . Current regimen:  o Ozempic 0.25mg  weekly for 4 weeks then increase to 0.5mg  weekly . Interventions: o D/C Januvia (completed) o Consistently take Victoza 1.8mg  daily (completed) o Switch to Ozempic when Victoza completed (completed) o Complete PAP for Ozempic (completed) . Patient self care activities - Over the next 90 days, patient will: o Check blood sugar twice daily, document, and provide at future appointments o Contact provider with any episodes of  hypoglycemia o Complete portions of PAP necessary to submit for Ozempic  Medication management . Pharmacist Clinical Goal(s): o Over the next 90 days, patient will work with PharmD and providers to achieve optimal medication adherence . [facility] . Interventions o Comprehensive medication review performed. o Utilize [facility] for medication synchronization, packaging and delivery . Patient self care activities - Over the next 90 days, patient will: o Focus on medication adherence by filling and taking medications appropriately  o Take medications as prescribed o Report any questions or concerns to PharmD and/or provider(s)  Please see past updates related to this goal by clicking on the "Past Updates" button in the selected goal        Verbal consent obtained for [facility] enhanced pharmacy services (medication synchronization, adherence packaging, delivery coordination). A medication sync plan was created to allow patient to get all medications delivered once every 30 to 90 days per patient preference. Patient understands they have freedom to choose pharmacy and clinical pharmacist will coordinate care between all prescribers and [facility].   The patient verbalized understanding of instructions provided today and agreed to receive a mailed copy of patient instruction and/or educational materials.  Telephone follow up appointment with pharmacy team member scheduled for: 08/10/2020  Melvenia Beam Corabelle Spackman, PharmD Clinical Pharmacist [facility] [phone]   Cholesterol Content in Foods Cholesterol is a waxy, fat-like substance that helps to carry fat in the blood. The body needs cholesterol in small amounts, but too much cholesterol can cause damage to the  arteries and heart. Most people should eat less than 200 milligrams (mg) of cholesterol a Sajan Cheatwood. Foods with cholesterol  Cholesterol is found in  animal-based foods, such as meat, seafood, and dairy. Generally, low-fat dairy and lean meats have less cholesterol than full-fat dairy and fatty meats. The milligrams of cholesterol per serving (mg per serving) of common cholesterol-containing foods are listed below. Meat and other proteins  Egg -- one large whole egg has 186 mg.  Veal shank -- 4 oz has 141 mg.  Lean ground [place] (93% lean) -- 4 oz has 118 mg.  Fat-trimmed lamb loin -- 4 oz has 106 mg.  Lean ground beef (90% lean) -- 4 oz has 100 mg.  Lobster -- 3.5 oz has 90 mg.  Pork loin chops -- 4 oz has 86 mg.  Canned salmon -- 3.5 oz has 83 mg.  Fat-trimmed beef top loin -- 4 oz has 78 mg.  Frankfurter -- 1 frank (3.5 oz) has 77 mg.  Crab -- 3.5 oz has 71 mg.  Roasted chicken without skin, white meat -- 4 oz has 66 mg.  Light bologna -- 2 oz has 45 mg.  Deli-cut [place] -- 2 oz has 31 mg.  Canned tuna -- 3.5 oz has 31 mg.  Berniece Salines -- 1 oz has 29 mg.  Oysters and mussels (raw) -- 3.5 oz has 25 mg.  Mackerel -- 1 oz has 22 mg.  Trout -- 1 oz has 20 mg.  Pork sausage -- 1 link (1 oz) has 17 mg.  Salmon -- 1 oz has 16 mg.  Tilapia -- 1 oz has 14 mg. Dairy  Soft-serve ice cream --  cup (4 oz) has 103 mg.  Whole-milk yogurt -- 1 cup (8 oz) has 29 mg.  Cheddar cheese -- 1 oz has 28 mg.  American cheese -- 1 oz has 28 mg.  Whole milk -- 1 cup (8 oz) has 23 mg.  2% milk -- 1 cup (8 oz) has 18 mg.  Cream cheese -- 1 tablespoon (Tbsp) has 15 mg.  Cottage cheese --  cup (4 oz) has 14 mg.  Low-fat (1%) milk -- 1 cup (8 oz) has 10 mg.  Sour cream -- 1 Tbsp has 8.5 mg.  Low-fat yogurt -- 1 cup (8 oz) has 8 mg.  Nonfat Greek yogurt -- 1 cup (8 oz) has 7 mg.  Half-and-half cream -- 1 Tbsp has 5 mg. Fats and oils  Cod liver oil -- 1 tablespoon (Tbsp) has 82 mg.  Butter -- 1 Tbsp has 15 mg.  Lard -- 1 Tbsp has 14 mg.  Bacon grease -- 1 Tbsp has 14 mg.  Mayonnaise -- 1 Tbsp has 5-10  mg.  Margarine -- 1 Tbsp has 3-10 mg. Exact amounts of cholesterol in these foods may vary depending on specific ingredients and brands. Foods without cholesterol Most plant-based foods do not have cholesterol unless you combine them with a food that has cholesterol. Foods without cholesterol include:  Grains and cereals.  Vegetables.  Fruits.  Vegetable oils, such as olive, canola, and sunflower oil.  Legumes, such as peas, beans, and lentils.  Nuts and seeds.  Egg whites. Summary  The body needs cholesterol in small amounts, but too much cholesterol can cause damage to the arteries and heart.  Most people should eat less than 200 milligrams (mg) of cholesterol a Almir Botts. This information is not intended to replace advice given to you by your health care provider. Make sure you discuss  any questions you have with your health care provider. Document Revised: 10/06/2017 Document Reviewed: 06/20/2017 Elsevier Patient Education  [place].

## 2020-07-07 ENCOUNTER — Telehealth: Payer: Self-pay

## 2020-07-07 DIAGNOSIS — K219 Gastro-esophageal reflux disease without esophagitis: Secondary | ICD-10-CM

## 2020-07-07 DIAGNOSIS — E785 Hyperlipidemia, unspecified: Secondary | ICD-10-CM

## 2020-07-07 DIAGNOSIS — F4323 Adjustment disorder with mixed anxiety and depressed mood: Secondary | ICD-10-CM

## 2020-07-07 DIAGNOSIS — I10 Essential (primary) hypertension: Secondary | ICD-10-CM

## 2020-07-07 DIAGNOSIS — F329 Major depressive disorder, single episode, unspecified: Secondary | ICD-10-CM

## 2020-07-07 DIAGNOSIS — G250 Essential tremor: Secondary | ICD-10-CM

## 2020-07-07 MED ORDER — VENLAFAXINE HCL ER 150 MG PO CP24: 150.0000 mg | ORAL_CAPSULE | Freq: Every day | ORAL | 3 refills | Status: DC

## 2020-07-07 MED ORDER — PROPRANOLOL HCL 10 MG PO TABS: ORAL_TABLET | ORAL | 3 refills | Status: DC

## 2020-07-07 MED ORDER — ATORVASTATIN CALCIUM 20 MG PO TABS: ORAL_TABLET | ORAL | 3 refills | Status: DC

## 2020-07-07 MED ORDER — LOSARTAN POTASSIUM 25 MG PO TABS: ORAL_TABLET | ORAL | 3 refills | Status: DC

## 2020-07-07 MED ORDER — OMEPRAZOLE 20 MG PO CPDR: DELAYED_RELEASE_CAPSULE | ORAL | 3 refills | Status: DC

## 2020-07-07 MED ORDER — TRAZODONE HCL 150 MG PO TABS: 150.0000 mg | ORAL_TABLET | Freq: Every day | ORAL | 3 refills | Status: DC

## 2020-07-07 NOTE — Telephone Encounter (Signed)
-----   Message from [place], [facility] sent at 07/06/2020  4:02 PM EDT ----- Regarding: Scripts to [facility]!   This patient has chosen to fill with [facility]. Would you be willing to send in 53 DS with appropriate refills to [facility] for the patient?   Atorvastatin 20mg   Losartan 25mg   Omeprazole 20mg   Propranolol 10mg   Venlafaxine  ER 150mg  Trazodone 150mg   Thanks, De Blanch, PharmD Clinical Pharmacist [facility] [phone]

## 2020-07-07 NOTE — Telephone Encounter (Signed)
Medications sent to pharmacy

## 2020-07-10 ENCOUNTER — Telehealth: Payer: Self-pay | Admitting: Pharmacist

## 2020-07-10 NOTE — Progress Notes (Addendum)
Chronic Care Management Pharmacy Assistant   Name: Abigail [name]  MRN: [id number] DOB: 04/03/1951  Reason for Encounter: Disease State  Patient Questions:  1.  Have you seen any other providers since your last visit? No  2.  Any changes in your medicines or health? No    PCP : Copland, Gay Filler, MD   Their chronic conditions include: Diabetes, Hypertension, Hyperlipidemia, Chest Pain, Anxiety, Tremor, Insomnia, GERD  No OV's, Consults, or Hospitalizations since their last visit with the clinical pharmacist.  Allergies:   Allergies  Allergen Reactions  . Pneumovax 23 [Pneumococcal Vac Polyvalent] Other (See Comments)    Pt had severe arm pain  . Amaryl [Glimepiride] Nausea Only  . Cephalexin Hives  . Sulfa Antibiotics Rash    Medications: Outpatient Encounter Medications as of 07/10/2020  Medication Sig Note  . Accu-Chek FastClix Lancets MISC USE TO TEST BLOOD SUGAR UP TO FOUR TIMES DAILY AS DIRECTED   . Ascorbic Acid (VITAMIN C) 1000 MG tablet Take 1,000 mg by mouth daily.   [name] atorvastatin (LIPITOR) 20 MG tablet TAKE 1 TABLET(20 MG) BY MOUTH DAILY   . blood glucose meter kit and supplies Dispense based on patient and insurance preference. Use up to four times daily as directed. (FOR ICD-10 E10.9, E11.9).   [name] glucose blood (ONETOUCH VERIO) test strip TEST BLOOD SUGARS TWICE DAILY.   [name] liraglutide (VICTOZA) 18 MG/3ML SOPN INJECT 1.8 MG INTO THE SKIN DAILY   . losartan (COZAAR) 25 MG tablet TAKE 1 TABLET(25 MG) BY MOUTH DAILY   . Multiple Vitamins-Minerals (ALIVE WOMENS GUMMY) CHEW Chew 2 tablets by mouth daily.   . nitroGLYCERIN (NITROSTAT) 0.4 MG SL tablet PLACE 1 TABLET UNDER THE TONGUE EVERY 5 MINUTES AS NEEDED FOR CHEST PAIN 06/12/2020: Has not had to use, but has it just in case  . omeprazole (PRILOSEC) 20 MG capsule TAKE 1 CAPSULE(20 MG) BY MOUTH DAILY   . propranolol (INDERAL) 10 MG tablet TAKE 1 TABLET BY MOUTH EVERY 8-12 HOURS AS NEEDED FOR TREMOR   .  sitaGLIPtin (JANUVIA) 50 MG tablet Take 1 tablet (50 mg total) by mouth daily. (Patient not taking: Reported on 06/12/2020) 06/12/2020: Has been out for 3 weeks  . traZODone (DESYREL) 150 MG tablet Take 1 tablet (150 mg total) by mouth at bedtime.   [name] venlafaxine XR (EFFEXOR-XR) 150 MG 24 hr capsule Take 1 capsule (150 mg total) by mouth daily with breakfast.   . zinc gluconate 50 MG tablet Take 50 mg by mouth daily.    No facility-administered encounter medications on file as of 07/10/2020.    Current Diagnosis: Patient Active Problem List   Diagnosis Date Noted  . 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/29/2020  . Essential hypertension   . Gastroesophageal reflux disease without esophagitis   . Microscopic hematuria 09/16/2015  . Trimalleolar fracture of ankle, closed 11/17/2014  . Other and unspecified hyperlipidemia 07/24/2013  . Chest pain 10/23/2012  . Diabetes mellitus ([place]) 10/08/2012  . Anxiety 10/08/2012    Goals Addressed   None    Recent Relevant Labs: Lab Results  Component Value Date/Time   HGBA1C 7.9 (H) 02/24/2020 03:52 PM   HGBA1C 8.3 (H) 10/23/2019 02:41 PM   MICROALBUR 3.1 (H) 06/03/2019 10:07 AM   MICROALBUR 6.7 (H) 10/25/2016 08:29 AM    Kidney Function Lab Results  Component Value Date/Time   CREATININE 1.53 (H) 02/24/2020 03:52 PM   CREATININE 1.37 (H) 10/23/2019 02:41 PM   CREATININE 1.02 05/04/2015 02:17 PM  CREATININE 1.03 11/03/2014 11:02 AM   GFR 33.51 (L) 02/24/2020 03:52 PM   GFRNONAA 48 (L) 08/17/2017 06:57 PM   GFRAA 56 (L) 08/17/2017 06:57 PM    . Current antihyperglycemic regimen:  o Ozempic 0.60m weekly for 4 weeks then increase to 0.581mweekly . What recent interventions/DTPs have been made to improve glycemic control:  o PAP for Ozempic approved 07-10-2020 . Have there been any recent hospitalizations or ED visits since last visit with CPP? No . Patient denies hypoglycemic symptoms, including None . Patient denies hyperglycemic symptoms, including  none . How often are you checking your blood sugar? twice daily . What are your blood sugars ranging? 200, 210, 218 are the last three readings patient could recall while on the call. . During the week, how often does your blood glucose drop below 70? Never . Are you checking your feet daily/regularly?   Adherence Review: Is the patient currently on a STATIN medication? Yes Is the patient currently on ACE/ARB medication? Yes Does the patient have >5 day gap between last estimated fill dates? No   Patient has been approved as of 07-10-2020 with ETA delivery of 10-14 business days. Ozempic will be delivered to PCP office. [employer] Patient states she has taken two doses of Ozempic and has not seen much improvement as of yet. She will continue to take this medication as directed. She will be going out of town from 07-16-2020 until 07-25-2020. If her Ozempic has arrived at the PCP's office before she returns, she will call to schedule a time for pick up.  Follow-Up:  Pharmacist Review   IvFanny SkatesCMCucumberharmacist Assistant [phone] by: KaDe BlanchPharmD Clinical Pharmacist [phone]

## 2020-07-12 ENCOUNTER — Other Ambulatory Visit: Payer: Self-pay | Admitting: Family Medicine

## 2020-07-12 ENCOUNTER — Encounter: Payer: Self-pay | Admitting: Family Medicine

## 2020-07-14 ENCOUNTER — Other Ambulatory Visit (HOSPITAL_BASED_OUTPATIENT_CLINIC_OR_DEPARTMENT_OTHER): Payer: Self-pay | Admitting: Family Medicine

## 2020-07-14 DIAGNOSIS — E1122 Type 2 diabetes mellitus with diabetic chronic kidney disease: Secondary | ICD-10-CM | POA: Diagnosis not present

## 2020-07-14 DIAGNOSIS — N1832 Chronic kidney disease, stage 3b: Secondary | ICD-10-CM | POA: Diagnosis not present

## 2020-07-14 DIAGNOSIS — I129 Hypertensive chronic kidney disease with stage 1 through stage 4 chronic kidney disease, or unspecified chronic kidney disease: Secondary | ICD-10-CM | POA: Diagnosis not present

## 2020-07-14 DIAGNOSIS — Z1231 Encounter for screening mammogram for malignant neoplasm of breast: Secondary | ICD-10-CM

## 2020-07-15 ENCOUNTER — Telehealth: Payer: Self-pay | Admitting: Family Medicine

## 2020-07-15 NOTE — Telephone Encounter (Signed)
Patient states insurance will not pay for glucose meter, patient will need a new meter. Per patient one touch meter. Per patient, pharmacy will send over a request.

## 2020-07-16 MED ORDER — ONETOUCH DELICA LANCETS 33G MISC: 1 refills | Status: DC

## 2020-07-16 MED ORDER — ONETOUCH ULTRA 2 W/DEVICE KIT: PACK | 0 refills | Status: DC

## 2020-07-16 MED ORDER — ONETOUCH ULTRA VI STRP: ORAL_STRIP | 1 refills | Status: DC

## 2020-07-16 NOTE — Telephone Encounter (Signed)
New meter has been sent in  

## 2020-07-28 ENCOUNTER — Encounter (HOSPITAL_BASED_OUTPATIENT_CLINIC_OR_DEPARTMENT_OTHER): Payer: Self-pay

## 2020-07-28 ENCOUNTER — Other Ambulatory Visit: Payer: Self-pay

## 2020-07-28 ENCOUNTER — Ambulatory Visit (HOSPITAL_BASED_OUTPATIENT_CLINIC_OR_DEPARTMENT_OTHER)
Admission: RE | Admit: 2020-07-28 | Discharge: 2020-07-28 | Disposition: A | Discharge status: Home / Self Care | Payer: PPO | Source: Ambulatory Visit | Attending: Family Medicine | Admitting: Family Medicine

## 2020-07-28 DIAGNOSIS — Z1231 Encounter for screening mammogram for malignant neoplasm of breast: Secondary | ICD-10-CM | POA: Insufficient documentation

## 2020-07-28 NOTE — Telephone Encounter (Signed)
Ozempic 1.34mg /ml prefilled pens arrived from [employer]. PO: [id number] Qty: 5 pens NDC: [id number] Lot #: [id number] Exp: 09/06/2022  Patient picked up today (07/28/20) @ 1040.

## 2020-08-10 ENCOUNTER — Ambulatory Visit: Payer: PPO | Admitting: Pharmacist

## 2020-08-10 DIAGNOSIS — E119 Type 2 diabetes mellitus without complications: Secondary | ICD-10-CM

## 2020-08-10 DIAGNOSIS — E785 Hyperlipidemia, unspecified: Secondary | ICD-10-CM

## 2020-08-10 NOTE — Chronic Care Management (AMB) (Signed)
Chronic Care Management Pharmacy  Name: Abigail [name]  MRN: [id number] DOB: September 17, 1951   Chief Complaint/ HPI  Abigail [name],  69 y.o. , female presents for their Follow-Up CCM visit with the clinical pharmacist via telephone due to COVID-19 Pandemic.  PCP : Darreld Mclean, MD  Their chronic conditions include: Diabetes, Hypertension, Hyperlipidemia, Chest Pain, Anxiety, Tremor, Insomnia, GERD  Office Visits: None since last CCM visit on 06/26/20.   Consult Visit: None since last CCM visit on 06/26/20.   Medications: Outpatient Encounter Medications as of 08/10/2020  Medication Sig Note  . Accu-Chek FastClix Lancets MISC USE TO TEST BLOOD SUGAR UP TO FOUR TIMES DAILY AS DIRECTED   . ACCU-CHEK GUIDE test strip USE TO CHECK BLOOD SUGAR UP TO FOUR TIMES DAILY   . Ascorbic Acid (VITAMIN C) 1000 MG tablet Take 1,000 mg by mouth daily.   [name] atorvastatin (LIPITOR) 20 MG tablet TAKE 1 TABLET(20 MG) BY MOUTH DAILY   . blood glucose meter kit and supplies Dispense based on patient and insurance preference. Use up to four times daily as directed. (FOR ICD-10 E10.9, E11.9).   [name] Blood Glucose Monitoring Suppl (ONE TOUCH ULTRA 2) w/Device KIT Use to check glucose up to 4 times daily. Dx Code E11.9   . glucose blood (ONETOUCH ULTRA) test strip Use to check glucose up to 4 times daily. Dx Code E11.9   . liraglutide (VICTOZA) 18 MG/3ML SOPN INJECT 1.8 MG INTO THE SKIN DAILY   . losartan (COZAAR) 25 MG tablet TAKE 1 TABLET(25 MG) BY MOUTH DAILY   . Multiple Vitamins-Minerals (ALIVE WOMENS GUMMY) CHEW Chew 2 tablets by mouth daily.   . nitroGLYCERIN (NITROSTAT) 0.4 MG SL tablet PLACE 1 TABLET UNDER THE TONGUE EVERY 5 MINUTES AS NEEDED FOR CHEST PAIN 06/12/2020: Has not had to use, but has it just in case  . omeprazole (PRILOSEC) 20 MG capsule TAKE 1 CAPSULE(20 MG) BY MOUTH DAILY   . OneTouch Delica Lancets 43K MISC Use to check glucose up to 4 times daily. Dx Code E11.9   . sitaGLIPtin  (JANUVIA) 50 MG tablet Take 1 tablet (50 mg total) by mouth daily. (Patient not taking: Reported on 06/12/2020) 06/12/2020: Has been out for 3 weeks  . traZODone (DESYREL) 150 MG tablet Take 1 tablet (150 mg total) by mouth at bedtime.   [name] zinc gluconate 50 MG tablet Take 50 mg by mouth daily.   . [DISCONTINUED] propranolol (INDERAL) 10 MG tablet TAKE 1 TABLET BY MOUTH EVERY 8-12 HOURS AS NEEDED FOR TREMOR   . [DISCONTINUED] venlafaxine XR (EFFEXOR-XR) 150 MG 24 hr capsule Take 1 capsule (150 mg total) by mouth daily with breakfast.    No facility-administered encounter medications on file as of 08/10/2020.   SDOH Screenings   Alcohol Screen:   . Last Alcohol Screening Score (AUDIT): Not on file  Depression (PHQ2-9): Low Risk   . PHQ-2 Score: 0  Financial Resource Strain: Medium Risk  . Difficulty of Paying Living Expenses: Somewhat hard  Food Insecurity: No Food Insecurity  . Worried About Charity fundraiser in the Last Year: Never true  . Ran Out of Food in the Last Year: Never true  Housing: Low Risk   . Last Housing Risk Score: 0  Physical Activity:   . Days of Exercise per Week: Not on file  . Minutes of Exercise per Session: Not on file  Social Connections:   . Frequency of Communication with Friends and Family: Not on  file  . Frequency of Social Gatherings with Friends and Family: Not on file  . Attends Religious Services: Not on file  . Active Member of Clubs or Organizations: Not on file  . Attends Archivist Meetings: Not on file  . Marital Status: Not on file  Stress:   . Feeling of Stress : Not on file  Tobacco Use: Low Risk   . Smoking Tobacco Use: Never Smoker  . Smokeless Tobacco Use: Never Used  Transportation Needs: No Transportation Needs  . Lack of Transportation (Medical): No  . Lack of Transportation (Non-Medical): No     Current Diagnosis/Assessment:  Goals Addressed            This Visit's Progress   . Chronic Care Management Pharmacy Care  Plan       CARE PLAN ENTRY (see longitudinal plan of care for additional care plan information)  Current Barriers:  . Chronic Disease Management support, education, and care coordination needs related to Diabetes, Hypertension, Hyperlipidemia, Chest Pain, Anxiety, Tremor, Insomnia, GERD   Hypertension BP Readings from Last 3 Encounters:  05/04/20 128/76  02/24/20 130/84  10/23/19 130/82   . Pharmacist Clinical Goal(s): o Over the next 90 days, patient will work with PharmD and providers to maintain BP goal <140/90 . Current regimen:  o Losartan 65m daily . Patient self care activities - Over the next 90 days, patient will: o Maintain hypertension medication regimen.  Hyperlipidemia Lab Results  Component Value Date/Time   LDLCALC 76 02/24/2020 03:52 PM   . Pharmacist Clinical Goal(s): o Over the next 90 days, patient will work with PharmD and providers to maintain LDL goal < 100 . Current regimen:  o Atorvastatin 27mdaily . Patient self care activities - Over the next 90 days, patient will: o Maintain cholesterol medication regimen.  Diabetes Lab Results  Component Value Date/Time   HGBA1C 7.9 (H) 02/24/2020 03:52 PM   HGBA1C 8.3 (H) 10/23/2019 02:41 PM   . Pharmacist Clinical Goal(s): o Over the next 90 days, patient will work with PharmD and providers to achieve A1c goal <7% . Current regimen:  o Ozempic 0.2569meekly for 4 weeks then increase to 0.5mg20mekly . Interventions: o D/C Januvia (completed) o Consistently take Victoza 1.8mg 49mly (completed) o Switch to Ozempic when Victoza completed (completed) o Complete PAP for Ozempic (completed) . Patient self care activities - Over the next 90 days, patient will: o Check blood sugar twice daily, document, and provide at future appointments o Contact provider with any episodes of hypoglycemia  Medication management . Pharmacist Clinical Goal(s): o Over the next 90 days, patient will work with PharmD and  providers to achieve optimal medication adherence . Current pharmacy: [facility] to [facility] . Interventions o Comprehensive medication review performed. o Utilize [facility] pharmacy for medication synchronization, packaging and delivery . Patient self care activities - Over the next 90 days, patient will: o Focus on medication adherence by filling and taking medications appropriately  o Take medications as prescribed o Report any questions or concerns to PharmD and/or provider(s)  Please see past updates related to this goal by clicking on the "Past Updates" button in the selected goal       Takes care of her mom who is 90. Lost husband in 2020 due to sepsis.  She was married 50 ye66s.  She has a daughter and son.  Has 4 grandchildren.  Fills box up every 4 weeks.   Update 08/10/20 Wonders if she has  fully processed the grief of her husband  Diabetes   A1c goal <7%  FBG 80-130  Recent Relevant Labs: Lab Results  Component Value Date/Time   HGBA1C 7.9 (H) 02/24/2020 03:52 PM   HGBA1C 8.3 (H) 10/23/2019 02:41 PM   GFR 33.51 (L) 02/24/2020 03:52 PM   GFR 38.10 (L) 10/23/2019 02:41 PM   MICROALBUR 3.1 (H) 06/03/2019 10:07 AM   MICROALBUR 6.7 (H) 10/25/2016 08:29 AM    Last diabetic Eye exam:  Lab Results  Component Value Date/Time   HMDIABEYEEXA No Retinopathy 08/17/2020 12:00 AM    Last diabetic Foot exam: No results found for: HMDIABFOOTEX   Checking BG: 2x per Mary Hockey  Recent FBG Readings: 180-200 yesterday at the lowest BG of 98. Lowest it's been in a long time. Highest about 215.   Patient has failed these meds in past: metformin (renal fx decline) Patient is currently uncontrolled on the following medications:  Ozempic 0.26m weekly  [facility] confirmed that all Tier 3 medications including Victoza, Ozempic, and Jardiance (Wilder Gladeis non-formulary on her plan) are $45 for 30 DS $90 for 60 DS and 90 DS  Patient would benefit  from filling for a 90 DS at the same cost as a $60 DS.  Victoza Fill Dates 04/20/20 60 DS 02/20/20 60 DS  Has 2 pens remaining (~20 Jasyn Mey supply)  Januvia Fill Dates 02/03/20 90 DS States this one was costing about $400 Is completely out. Has been out for about 3 weeks  States she has a family hx of diabetes. She also gets very sad about the passing of her husband and tends to eat more.  In 2016 states she was around 130lbs and felt that was too thin.  She is now around 153 and states she would like to be 10-15lbs lighter.   We discussed:  DM goals  How to dose ozempic (0.236mweekly x 4 weeks then move to 0.38m738meekly)  No need to refill Januvia  Will complete appropriate portions of the PAP for Ozempic. Will place patient portion up front for her to sign at her earliest convenience. Also requested that patient provide proof of income. Will provide portions for Dr. CopLorelei Pont sign. Will fax completed applications when all portions completed and returned.  Update 06/26/20 Has moved up to Victoza 1.8mg32mily.  Has been checking her BG twice daily. Very stressed about her husband's death anniversary.  Fasting Bedtime 168  165 184  193 262  218 182   188  190 243  230 168   219  220 191  223 238  188 170   197   170  225 Average 198.4  205.7  Update 08/10/20 She is on her second week of Ozempic. No low or high symptoms.  She is concerned with elevated BG.  Diet B - toast with jam; oatmeal plus toast; eggs with toast L - meatball sub; skinny bagel with chicken salad D - plain chicken wings, 1/2 cup broccoli casserole, and 1/2 mashed potatoes Snacks - No, but reports she ate a caramel a few minutes ago Drinks - 1/2 -1/2 ice tea daily, caffeine free diet coke, reports she doesn't drink much water  Exercise Not as active as when she was in her home Tries to walk dog once-twice a [place] about 30 minutes Does have access to gym at her apt, but doesn't have anyone to go  with her.   Plan -Drink 3 bottles of water daily -Continue with  Ozempic 0.51m -Tamarius Rosenfield to follow up in 2 weeks  Future Plan -If BG not at goal on max dose of Ozempic, consider completing PAP for patient to get FWilder Gladenoting it's indication for patients with CKD. Also note that this could be added without regard to a1c, but also note this would increase pill burden. Will have discussion in future.    Hyperlipidemia   LDL goal <100  Lipid Panel     Component Value Date/Time   CHOL 173 02/24/2020 1552   TRIG 199.0 (H) 02/24/2020 1552   HDL 56.60 02/24/2020 1552   LDLCALC 76 02/24/2020 1552    Hepatic Function Latest Ref Rng & Units 10/23/2019 06/03/2019 02/01/2018  Total Protein 6.0 - 8.3 g/dL 6.6 7.0 6.8  Albumin 3.5 - 5.2 g/dL 4.3 4.5 3.9  AST 0 - 37 U/L 13 13 12   ALT 0 - 35 U/L 11 10 10   Alk Phosphatase 39 - 117 U/L 97 107 66  Total Bilirubin 0.2 - 1.2 mg/dL 0.3 0.4 0.3     The 10-year ASCVD risk score (Mikey BussingDC Jr., et al., 2013) is: 22%   Values used to calculate the score:     Age: [age]years     Sex: Female     Is Non-Hispanic African American: No     Diabetic: Yes     Tobacco smoker: No     Systolic Blood Pressure: 1115mmHg     Is BP treated: Yes     HDL Cholesterol: 56.6 mg/dL     Total Cholesterol: 173 mg/dL   Patient has failed these meds in past: None noted  Patient is currently controlled on the following medications:  . Atorvastatin 264mdaily  Fill Dates 05/02/20 90 DS 02/02/20 90 DS 05/02/20 90 DS  Update 06/26/20 Discussed limiting foods that would increase TRIG  Plan -Continue current medications    Miscellaneous Meds  Zinc gluconate 5049multivitamin Vitamin C 282m56mtamin K2 100mg56mamin D 1000 units Vitamin B12 1000mcg69mStream Received her first delivery.  Will be placed in packaging in November.   KaneshDe BlanchmD Clinical Pharmacist [facility] at [phone]

## 2020-08-14 ENCOUNTER — Other Ambulatory Visit: Payer: Self-pay | Admitting: Family Medicine

## 2020-08-14 DIAGNOSIS — F4323 Adjustment disorder with mixed anxiety and depressed mood: Secondary | ICD-10-CM

## 2020-08-17 LAB — HM DIABETES EYE EXAM

## 2020-08-19 ENCOUNTER — Encounter: Payer: Self-pay | Admitting: *Deleted

## 2020-08-24 ENCOUNTER — Ambulatory Visit: Payer: PPO | Admitting: Pharmacist

## 2020-08-24 DIAGNOSIS — E119 Type 2 diabetes mellitus without complications: Secondary | ICD-10-CM

## 2020-08-24 DIAGNOSIS — E785 Hyperlipidemia, unspecified: Secondary | ICD-10-CM

## 2020-08-24 NOTE — Chronic Care Management (AMB) (Signed)
Chronic Care Management Pharmacy  Name: Abigail [name]  MRN: [id number] DOB: Nov 15, 1950   Chief Complaint/ HPI  Abigail [name],  69 y.o. , female presents for their Follow-Up CCM visit with the clinical pharmacist via telephone due to COVID-19 Pandemic.  PCP : Darreld Mclean, MD  Their chronic conditions include: Diabetes, Hypertension, Hyperlipidemia, Chest Pain, Anxiety, Tremor, Insomnia, GERD  Office Visits: None since last CCM visit on 08/10/20.   Consult Visit: None since last CCM visit on 08/10/20.   Medications: Outpatient Encounter Medications as of 08/24/2020  Medication Sig Note  . Accu-Chek FastClix Lancets MISC USE TO TEST BLOOD SUGAR UP TO FOUR TIMES DAILY AS DIRECTED   . ACCU-CHEK GUIDE test strip USE TO CHECK BLOOD SUGAR UP TO FOUR TIMES DAILY   . Ascorbic Acid (VITAMIN C) 1000 MG tablet Take 1,000 mg by mouth daily.   [name] atorvastatin (LIPITOR) 20 MG tablet TAKE 1 TABLET(20 MG) BY MOUTH DAILY   . blood glucose meter kit and supplies Dispense based on patient and insurance preference. Use up to four times daily as directed. (FOR ICD-10 E10.9, E11.9).   [name] Blood Glucose Monitoring Suppl (ONE TOUCH ULTRA 2) w/Device KIT Use to check glucose up to 4 times daily. Dx Code E11.9   . glucose blood (ONETOUCH ULTRA) test strip Use to check glucose up to 4 times daily. Dx Code E11.9   . liraglutide (VICTOZA) 18 MG/3ML SOPN INJECT 1.8 MG INTO THE SKIN DAILY   . losartan (COZAAR) 25 MG tablet TAKE 1 TABLET(25 MG) BY MOUTH DAILY   . Multiple Vitamins-Minerals (ALIVE WOMENS GUMMY) CHEW Chew 2 tablets by mouth daily.   . nitroGLYCERIN (NITROSTAT) 0.4 MG SL tablet PLACE 1 TABLET UNDER THE TONGUE EVERY 5 MINUTES AS NEEDED FOR CHEST PAIN 06/12/2020: Has not had to use, but has it just in case  . omeprazole (PRILOSEC) 20 MG capsule TAKE 1 CAPSULE(20 MG) BY MOUTH DAILY   . OneTouch Delica Lancets 24S MISC Use to check glucose up to 4 times daily. Dx Code E11.9   . sitaGLIPtin  (JANUVIA) 50 MG tablet Take 1 tablet (50 mg total) by mouth daily. (Patient not taking: Reported on 06/12/2020) 06/12/2020: Has been out for 3 weeks  . traZODone (DESYREL) 150 MG tablet Take 1 tablet (150 mg total) by mouth at bedtime.   [name] venlafaxine XR (EFFEXOR-XR) 150 MG 24 hr capsule TAKE 1 CAPSULE(150 MG) BY MOUTH DAILY WITH BREAKFAST   . zinc gluconate 50 MG tablet Take 50 mg by mouth daily.   . [DISCONTINUED] propranolol (INDERAL) 10 MG tablet TAKE 1 TABLET BY MOUTH EVERY 8-12 HOURS AS NEEDED FOR TREMOR    No facility-administered encounter medications on file as of 08/24/2020.   SDOH Screenings   Alcohol Screen:   . Last Alcohol Screening Score (AUDIT): Not on file  Depression (PHQ2-9): Low Risk   . PHQ-2 Score: 0  Financial Resource Strain: Medium Risk  . Difficulty of Paying Living Expenses: Somewhat hard  Food Insecurity: No Food Insecurity  . Worried About Charity fundraiser in the Last Year: Never true  . Ran Out of Food in the Last Year: Never true  Housing: Low Risk   . Last Housing Risk Score: 0  Physical Activity:   . Days of Exercise per Week: Not on file  . Minutes of Exercise per Session: Not on file  Social Connections:   . Frequency of Communication with Friends and Family: Not on file  .  Frequency of Social Gatherings with Friends and Family: Not on file  . Attends Religious Services: Not on file  . Active Member of Clubs or Organizations: Not on file  . Attends Archivist Meetings: Not on file  . Marital Status: Not on file  Stress:   . Feeling of Stress : Not on file  Tobacco Use: Low Risk   . Smoking Tobacco Use: Never Smoker  . Smokeless Tobacco Use: Never Used  Transportation Needs: No Transportation Needs  . Lack of Transportation (Medical): No  . Lack of Transportation (Non-Medical): No     Current Diagnosis/Assessment:  Goals Addressed            This Visit's Progress   . Chronic Care Management Pharmacy Care Plan       CARE PLAN  ENTRY (see longitudinal plan of care for additional care plan information)  Current Barriers:  . Chronic Disease Management support, education, and care coordination needs related to Diabetes, Hypertension, Hyperlipidemia, Chest Pain, Anxiety, Tremor, Insomnia, GERD   Hypertension BP Readings from Last 3 Encounters:  05/04/20 128/76  02/24/20 130/84  10/23/19 130/82   . Pharmacist Clinical Goal(s): o Over the next 90 days, patient will work with PharmD and providers to maintain BP goal <140/90 . Current regimen:  o Losartan 36m daily . Patient self care activities - Over the next 90 days, patient will: o Maintain hypertension medication regimen.  Hyperlipidemia Lab Results  Component Value Date/Time   LDLCALC 76 02/24/2020 03:52 PM   . Pharmacist Clinical Goal(s): o Over the next 90 days, patient will work with PharmD and providers to maintain LDL goal < 100 . Current regimen:  o Atorvastatin 280mdaily . Patient self care activities - Over the next 90 days, patient will: o Maintain cholesterol medication regimen.  Diabetes Lab Results  Component Value Date/Time   HGBA1C 7.9 (H) 02/24/2020 03:52 PM   HGBA1C 8.3 (H) 10/23/2019 02:41 PM   . Pharmacist Clinical Goal(s): o Over the next 90 days, patient will work with PharmD and providers to achieve A1c goal <7% . Current regimen:  o Ozempic 40m15maily . Interventions: o Titrated ozempic to 40mg78mily . Patient self care activities - Over the next 90 days, patient will: o Check blood sugar twice daily, document, and provide at future appointments o Contact provider with any episodes of hypoglycemia  Medication management . Pharmacist Clinical Goal(s): o Over the next 90 days, patient will work with PharmD and providers to achieve optimal medication adherence . [facility] . Interventions o Comprehensive medication review performed. o Utilize [facility] pharmacy for medication  synchronization, packaging and delivery . Patient self care activities - Over the next 90 days, patient will: o Focus on medication adherence by filling and taking medications appropriately  o Take medications as prescribed o Report any questions or concerns to PharmD and/or provider(s)  Please see past updates related to this goal by clicking on the "Past Updates" button in the selected goal       Takes care of her mom who is 90. Lost husband in 2020 due to sepsis.  She was married 50 y27rs.  She has a daughter and son.  Has 4 grandchildren.  Fills box up every 4 weeks.   Update 08/10/20 Wonders if she has fully processed the grief of her husband  Diabetes   A1c goal <7%  FBG 80-130  Recent Relevant Labs: Lab Results  Component Value Date/Time   HGBA1C  7.9 (H) 02/24/2020 03:52 PM   HGBA1C 8.3 (H) 10/23/2019 02:41 PM   GFR 33.51 (L) 02/24/2020 03:52 PM   GFR 38.10 (L) 10/23/2019 02:41 PM   MICROALBUR 3.1 (H) 06/03/2019 10:07 AM   MICROALBUR 6.7 (H) 10/25/2016 08:29 AM    Last diabetic Eye exam:  Lab Results  Component Value Date/Time   HMDIABEYEEXA No Retinopathy 08/17/2020 12:00 AM    Last diabetic Foot exam: No results found for: HMDIABFOOTEX   Checking BG: 2x per Margurite Duffy  Recent FBG Readings: 180-200 yesterday at the lowest BG of 98. Lowest it's been in a long time. Highest about 215.   Patient has failed these meds in past: metformin (renal fx decline) Patient is currently uncontrolled on the following medications:  Ozempic 0.4m weekly  [facility]ed [facility] confirmed that all Tier 3 medications including Victoza, Ozempic, and Jardiance (Wilder Gladeis non-formulary on her plan) are $45 for 30 DS $90 for 60 DS and 90 DS  Patient would benefit from filling for a 90 DS at the same cost as a $60 DS.  Victoza Fill Dates 04/20/20 60 DS 02/20/20 60 DS  Has 2 pens remaining (~20 Adya Wirz supply)  Januvia Fill Dates 02/03/20 90 DS States this one was  costing about $400 Is completely out. Has been out for about 3 weeks  States she has a family hx of diabetes. She also gets very sad about the passing of her husband and tends to eat more.  In 2016 states she was around 130lbs and felt that was too thin.  She is now around 153 and states she would like to be 10-15lbs lighter.   We discussed:  DM goals  How to dose ozempic (0.250mweekly x 4 weeks then move to 0.46m51meekly)  No need to refill Januvia  Will complete appropriate portions of the PAP for Ozempic. Will place patient portion up front for her to sign at her earliest convenience. Also requested that patient provide proof of income. Will provide portions for Dr. CopLorelei Pont sign. Will fax completed applications when all portions completed and returned.  Update 06/26/20 Has moved up to Victoza 1.8mg43mily.  Has been checking her BG twice daily. Very stressed about her husband's death anniversary.  Fasting Bedtime 168  165 184  193 262  218 182   188  190 243  230 168   219  220 191  223 238  188 170   197   170  225 Average 198.4  205.7  Update 08/10/20 She is on her second week of Ozempic. No low or high symptoms.  She is concerned with elevated BG.  Diet B - toast with jam; oatmeal plus toast; eggs with toast L - meatball sub; skinny bagel with chicken salad D - plain chicken wings, 1/2 cup broccoli casserole, and 1/2 mashed potatoes Snacks - No, but reports she ate a caramel a few minutes ago Drinks - 1/2 -1/2 ice tea daily, caffeine free diet coke, reports she doesn't drink much water  Exercise Not as active as when she was in her home Tries to walk dog once-twice a Timika Muench about 30 minutes Does have access to gym at her apt, but doesn't have anyone to go with her.  Update 08/24/20 How many pens of Ozempic remaining? 6 boxes Start 1mg 23mFriday.  Only drinking one bottle of water  daily 195 179 205 202 180 201 197  219 154 220 178 182 183 181  Avg 198.4  201.7  BG about the same as last visit. Will increase Ozempic.   Plan -Increase to Oxempic 432m daily (patient to give #2 injections of 0.5432muntil she completes medication and received Ozempic 32m63men) -Update PAP application to have 32mg23mn provided to patient   Future Plan -If BG not at goal on max dose of Ozempic, consider completing PAP for patient to get FarxWilder Gladeing it's indication for patients with CKD. Also note that this could be added without regard to a1c, but also note this would increase pill burden. Will have discussion in future.    Hyperlipidemia   LDL goal <100  Lipid Panel     Component Value Date/Time   CHOL 173 02/24/2020 1552   TRIG 199.0 (H) 02/24/2020 1552   HDL 56.60 02/24/2020 1552   LDLCALC 76 02/24/2020 1552    Hepatic Function Latest Ref Rng & Units 10/23/2019 06/03/2019 02/01/2018  Total Protein 6.0 - 8.3 g/dL 6.6 7.0 6.8  Albumin 3.5 - 5.2 g/dL 4.3 4.5 3.9  AST 0 - 37 U/L _0 ALT 0 - 35 U/L _1 Alk Phosphatase 39 - 117 U/L 97 107 66  Total Bilirubin 0.2 - 1.2 mg/dL 0.3 0.4 0.3     The 10-year ASCVD risk score (GofMikey BussingJr., et al., 2013) is: 22%   Values used to calculate the score:     Age: 25 y28rs     Sex: Female     Is Non-Hispanic African American: No     Diabetic: Yes     Tobacco smoker: No     Systolic Blood Pressure: 128 643g     Is BP treated: Yes     HDL Cholesterol: 56.6 mg/dL     Total Cholesterol: 173 mg/dL   Patient has failed these meds in past: None noted  Patient is currently controlled on the following medications:  . Atorvastatin 20mg33mly  Fill Dates 05/02/20 90 DS 02/02/20 90 DS 05/02/20 90 DS  Update 06/26/20 Discussed limiting foods that would increase TRIG  Update 08/26/20 Still tolerating current regimen  Plan -Continue current medications    Miscellaneous Meds  Zinc gluconate 50mg 632mivitamin Vitamin C  282mg V60min K2 100mg Vi80mn D 1000 units Vitamin B12 1000mcg  U20meam Received her first delivery.  Will be placed in packaging in November.   Kanesha DDe BlanchClinical Pharmacist [facility] at [phone]

## 2020-08-26 ENCOUNTER — Other Ambulatory Visit: Payer: Self-pay | Admitting: Family Medicine

## 2020-08-26 DIAGNOSIS — G250 Essential tremor: Secondary | ICD-10-CM

## 2020-08-26 NOTE — Patient Instructions (Signed)
Visit Information  Goals Addressed            This Visit's Progress    Chronic Care Management Pharmacy Care Plan       CARE PLAN ENTRY (see longitudinal plan of care for additional care plan information)  Current Barriers:   Chronic Disease Management support, education, and care coordination needs related to Diabetes, Hypertension, Hyperlipidemia, Chest Pain, Anxiety, Tremor, Insomnia, GERD   Hypertension BP Readings from Last 3 Encounters:  05/04/20 128/76  02/24/20 130/84  10/23/19 130/82    Pharmacist Clinical Goal(s): o Over the next 90 days, patient will work with PharmD and providers to maintain BP goal <140/90  Current regimen:  o Losartan 25mg  daily  Patient self care activities - Over the next 90 days, patient will: o Maintain hypertension medication regimen.  Hyperlipidemia Lab Results  Component Value Date/Time   [facility] 76 02/24/2020 03:52 PM    Pharmacist Clinical Goal(s): o Over the next 90 days, patient will work with PharmD and providers to maintain LDL goal < 100  Current regimen:  o Atorvastatin 20mg  daily  Patient self care activities - Over the next 90 days, patient will: o Maintain cholesterol medication regimen.  Diabetes Lab Results  Component Value Date/Time   HGBA1C 7.9 (H) 02/24/2020 03:52 PM   HGBA1C 8.3 (H) 10/23/2019 02:41 PM    Pharmacist Clinical Goal(s): o Over the next 90 days, patient will work with PharmD and providers to achieve A1c goal <7%  Current regimen:  o Ozempic 1mg  daily  Interventions: o Titrated ozempic to 1mg  daily  Patient self care activities - Over the next 90 days, patient will: o Check blood sugar twice daily, document, and provide at future appointments o Contact provider with any episodes of hypoglycemia  Medication management  Pharmacist Clinical Goal(s): o Over the next 90 days, patient will work with PharmD and providers to achieve optimal medication adherence  Current pharmacy:  [facility] switched to [facility]  Interventions o Comprehensive medication review performed. o Utilize [facility] pharmacy for medication synchronization, packaging and delivery  Patient self care activities - Over the next 90 days, patient will: o Focus on medication adherence by filling and taking medications appropriately  o Take medications as prescribed o Report any questions or concerns to PharmD and/or provider(s)  Please see past updates related to this goal by clicking on the "Past Updates" button in the selected goal         Patient verbalizes understanding of instructions provided today.   Telephone follow up appointment with pharmacy team member scheduled for: 09/25/2020  Melvenia Beam Maricel Swartzendruber, PharmD Clinical Pharmacist [facility] [phone]

## 2020-08-26 NOTE — Patient Instructions (Signed)
Visit Information  Goals Addressed            This Visit's Progress   . Chronic Care Management Pharmacy Care Plan       CARE PLAN ENTRY (see longitudinal plan of care for additional care plan information)  Current Barriers:  . Chronic Disease Management support, education, and care coordination needs related to Diabetes, Hypertension, Hyperlipidemia, Chest Pain, Anxiety, Tremor, Insomnia, GERD   Hypertension BP Readings from Last 3 Encounters:  05/04/20 128/76  02/24/20 130/84  10/23/19 130/82   . Pharmacist Clinical Goal(s): o Over the next 90 days, patient will work with PharmD and providers to maintain BP goal <140/90 . Current regimen:  o Losartan 25mg  daily . Patient self care activities - Over the next 90 days, patient will: o Maintain hypertension medication regimen.  Hyperlipidemia Lab Results  Component Value Date/Time   [facility] 76 02/24/2020 03:52 PM   . Pharmacist Clinical Goal(s): o Over the next 90 days, patient will work with PharmD and providers to maintain LDL goal < 100 . Current regimen:  o Atorvastatin 20mg  daily . Patient self care activities - Over the next 90 days, patient will: o Maintain cholesterol medication regimen.  Diabetes Lab Results  Component Value Date/Time   HGBA1C 7.9 (H) 02/24/2020 03:52 PM   HGBA1C 8.3 (H) 10/23/2019 02:41 PM   . Pharmacist Clinical Goal(s): o Over the next 90 days, patient will work with PharmD and providers to achieve A1c goal <7% . Current regimen:  o Ozempic 0.25mg  weekly for 4 weeks then increase to 0.5mg  weekly . Interventions: o D/C Januvia (completed) o Consistently take Victoza 1.8mg  daily (completed) o Switch to Ozempic when Victoza completed (completed) o Complete PAP for Ozempic (completed) . Patient self care activities - Over the next 90 days, patient will: o Check blood sugar twice daily, document, and provide at future appointments o Contact provider with any episodes of  hypoglycemia  Medication management . Pharmacist Clinical Goal(s): o Over the next 90 days, patient will work with PharmD and providers to achieve optimal medication adherence . Current pharmacy: [facility] switched to [facility] . Interventions o Comprehensive medication review performed. o Utilize [facility] pharmacy for medication synchronization, packaging and delivery . Patient self care activities - Over the next 90 days, patient will: o Focus on medication adherence by filling and taking medications appropriately  o Take medications as prescribed o Report any questions or concerns to PharmD and/or provider(s)  Please see past updates related to this goal by clicking on the "Past Updates" button in the selected goal         Patient verbalizes understanding of instructions provided today.   Telephone follow up appointment with pharmacy team member scheduled for: 08/24/20  De Blanch, PharmD Clinical Pharmacist [facility] at [facility] [phone]

## 2020-09-08 ENCOUNTER — Telehealth: Payer: Self-pay | Admitting: Pharmacist

## 2020-09-08 NOTE — Progress Notes (Addendum)
Chronic Care Management Pharmacy Assistant   Name: Abigail [name]  MRN: [id number] DOB: May 25, 1951  Reason for Encounter: Medication Review  Patient Questions:  1.  Have you seen any other providers since your last visit? No  2.  Any changes in your medicines or health? No    PCP : Copland, Gay Filler, MD   Their chronic conditions include: Diabetes, Hypertension, Hyperlipidemia, Chest Pain, Anxiety, Tremor, Insomnia, GERD  Allergies:   Allergies  Allergen Reactions  . Pneumovax 23 [Pneumococcal Vac Polyvalent] Other (See Comments)    Pt had severe arm pain  . Amaryl [Glimepiride] Nausea Only  . Cephalexin Hives  . Sulfa Antibiotics Rash    Medications: Outpatient Encounter Medications as of 09/08/2020  Medication Sig Note  . Accu-Chek FastClix Lancets MISC USE TO TEST BLOOD SUGAR UP TO FOUR TIMES DAILY AS DIRECTED   . ACCU-CHEK GUIDE test strip USE TO CHECK BLOOD SUGAR UP TO FOUR TIMES DAILY   . Ascorbic Acid (VITAMIN C) 1000 MG tablet Take 1,000 mg by mouth daily.   [name] atorvastatin (LIPITOR) 20 MG tablet TAKE 1 TABLET(20 MG) BY MOUTH DAILY   . blood glucose meter kit and supplies Dispense based on patient and insurance preference. Use up to four times daily as directed. (FOR ICD-10 E10.9, E11.9).   [name] Blood Glucose Monitoring Suppl (ONE TOUCH ULTRA 2) w/Device KIT Use to check glucose up to 4 times daily. Dx Code E11.9   . glucose blood (ONETOUCH ULTRA) test strip Use to check glucose up to 4 times daily. Dx Code E11.9   . liraglutide (VICTOZA) 18 MG/3ML SOPN INJECT 1.8 MG INTO THE SKIN DAILY   . losartan (COZAAR) 25 MG tablet TAKE 1 TABLET(25 MG) BY MOUTH DAILY   . Multiple Vitamins-Minerals (ALIVE WOMENS GUMMY) CHEW Chew 2 tablets by mouth daily.   . nitroGLYCERIN (NITROSTAT) 0.4 MG SL tablet PLACE 1 TABLET UNDER THE TONGUE EVERY 5 MINUTES AS NEEDED FOR CHEST PAIN 06/12/2020: Has not had to use, but has it just in case  . omeprazole (PRILOSEC) 20 MG capsule TAKE 1  CAPSULE(20 MG) BY MOUTH DAILY   . OneTouch Delica Lancets 12W MISC Use to check glucose up to 4 times daily. Dx Code E11.9   . propranolol (INDERAL) 10 MG tablet TAKE 1 TABLET BY MOUTH EVERY 8-12 HOURS AS NEEDED FOR TREMOR   . sitaGLIPtin (JANUVIA) 50 MG tablet Take 1 tablet (50 mg total) by mouth daily. (Patient not taking: Reported on 06/12/2020) 06/12/2020: Has been out for 3 weeks  . traZODone (DESYREL) 150 MG tablet Take 1 tablet (150 mg total) by mouth at bedtime.   [name] venlafaxine XR (EFFEXOR-XR) 150 MG 24 hr capsule TAKE 1 CAPSULE(150 MG) BY MOUTH DAILY WITH BREAKFAST   . zinc gluconate 50 MG tablet Take 50 mg by mouth daily.    No facility-administered encounter medications on file as of 09/08/2020.    Current Diagnosis: Patient Active Problem List   Diagnosis Date Noted  . 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/29/2020  . Essential hypertension   . Gastroesophageal reflux disease without esophagitis   . Microscopic hematuria 09/16/2015  . Trimalleolar fracture of ankle, closed 11/17/2014  . Other and unspecified hyperlipidemia 07/24/2013  . Chest pain 10/23/2012  . Diabetes mellitus ([place]) 10/08/2012  . Anxiety 10/08/2012    Goals Addressed   None    No OVs, Consults, or hospital visits since last care coordination call/Pharmacist visit.   No medication changes indicated OR if recent visit, treatment  plan here.  BP Readings from Last 3 Encounters:  05/04/20 128/76  02/24/20 130/84  10/23/19 130/82    Lab Results  Component Value Date   HGBA1C 7.9 (H) 02/24/2020     Patient obtains medications through Adherence Packaging  90 Days   Last adherence delivery included:  Atorvastatin 20 mg Losartan 25 mg Propranolol 10 mg Venlafaxine 150 mg Trazodone 150 mg-Vial  Patient declined no medication last month due to PRN use/additional supply on hand.   Patient is due for next adherence delivery on: 09-15-20. Called patient and reviewed medications and coordinated delivery.  This delivery to  include: Omeprazole 20 mg  Patient declined the following medications  due to sufficient quantity on hand. Last filled 07/28/20 for 47 DS to get medications synched.  Atorvastatin 20 mg Losartan 25 mg Propranolol 10 mg Venlafaxine 150 mg Trazodone 150 mg-Vial  Patient needs no refills   Confirmed delivery date of 09-15-20, advised patient that pharmacy will contact them the morning of delivery.   Follow-Up:  Comptroller and Pharmacist Review   [name], [facility] [phone]  Called patient and she states she still has meds from her previous pharmacy.  Encouraged patient to let us know when she was running low to prevent gap in medication therapy and rework medication synchronization plan.   Reviewed by: De Blanch, PharmD Clinical Pharmacist [facility] at [facility] [phone]

## 2020-09-22 ENCOUNTER — Encounter: Payer: Self-pay | Admitting: Family Medicine

## 2020-09-24 ENCOUNTER — Encounter: Payer: Self-pay | Admitting: Family Medicine

## 2020-09-25 ENCOUNTER — Ambulatory Visit: Payer: PPO

## 2020-09-25 DIAGNOSIS — E785 Hyperlipidemia, unspecified: Secondary | ICD-10-CM

## 2020-09-25 DIAGNOSIS — I10 Essential (primary) hypertension: Secondary | ICD-10-CM

## 2020-09-25 DIAGNOSIS — E119 Type 2 diabetes mellitus without complications: Secondary | ICD-10-CM

## 2020-09-25 NOTE — Chronic Care Management (AMB) (Signed)
Chronic Care Management Pharmacy  Name: Abigail [name]  MRN: [id number] DOB: [birth year]   Chief Complaint/ HPI  Abigail [name],  69 y.o. , female presents for their Follow-Up CCM visit with the clinical pharmacist via telephone due to COVID-19 Pandemic.  PCP : Abigail Mclean, MD  Their chronic conditions include: Diabetes, Hypertension, Hyperlipidemia, Chest Pain, Anxiety, Tremor, Insomnia, GERD  Office Visits: None since last CCM visit on 08/10/20.   Consult Visit: None since last CCM visit on 08/10/20.   Medications: Outpatient Encounter Medications as of 09/25/2020  Medication Sig Note  . Semaglutide, 1 MG/DOSE, (OZEMPIC, 1 MG/DOSE,) 2 MG/1.5ML SOPN Inject 1 mg into the skin once a week. Getting through PAP   . Accu-Chek FastClix Lancets MISC USE TO TEST BLOOD SUGAR UP TO FOUR TIMES DAILY AS DIRECTED   . ACCU-CHEK GUIDE test strip USE TO CHECK BLOOD SUGAR UP TO FOUR TIMES DAILY   . Ascorbic Acid (VITAMIN C) 1000 MG tablet Take 1,000 mg by mouth daily.   [name] atorvastatin (LIPITOR) 20 MG tablet TAKE 1 TABLET(20 MG) BY MOUTH DAILY   . blood glucose meter kit and supplies Dispense based on patient and insurance preference. Use up to four times daily as directed. (FOR ICD-10 E10.9, E11.9).   [name] Blood Glucose Monitoring Suppl (ONE TOUCH ULTRA 2) w/Device KIT Use to check glucose up to 4 times daily. Dx Code E11.9   . glucose blood (ONETOUCH ULTRA) test strip Use to check glucose up to 4 times daily. Dx Code E11.9   . losartan (COZAAR) 25 MG tablet TAKE 1 TABLET(25 MG) BY MOUTH DAILY   . Multiple Vitamins-Minerals (ALIVE WOMENS GUMMY) CHEW Chew 2 tablets by mouth daily.   . nitroGLYCERIN (NITROSTAT) 0.4 MG SL tablet PLACE 1 TABLET UNDER THE TONGUE EVERY 5 MINUTES AS NEEDED FOR CHEST PAIN 06/12/2020: Has not had to use, but has it just in case  . omeprazole (PRILOSEC) 20 MG capsule TAKE 1 CAPSULE(20 MG) BY MOUTH DAILY   . OneTouch Delica Lancets 29J MISC Use to check glucose up  to 4 times daily. Dx Code E11.9   . propranolol (INDERAL) 10 MG tablet TAKE 1 TABLET BY MOUTH EVERY 8-12 HOURS AS NEEDED FOR TREMOR   . traZODone (DESYREL) 150 MG tablet Take 1 tablet (150 mg total) by mouth at bedtime.   [name] venlafaxine XR (EFFEXOR-XR) 150 MG 24 hr capsule TAKE 1 CAPSULE(150 MG) BY MOUTH DAILY WITH BREAKFAST   . zinc gluconate 50 MG tablet Take 50 mg by mouth daily.   . [DISCONTINUED] liraglutide (VICTOZA) 18 MG/3ML SOPN INJECT 1.8 MG INTO THE SKIN DAILY 09/25/2020: Switched to [place]  . [DISCONTINUED] sitaGLIPtin (JANUVIA) 50 MG tablet Take 1 tablet (50 mg total) by mouth daily. (Patient not taking: Reported on 06/12/2020) 06/12/2020: Has been out for 3 weeks   No facility-administered encounter medications on file as of 09/25/2020.   SDOH Screenings   Alcohol Screen:   . Last Alcohol Screening Score (AUDIT): Not on file  Depression (PHQ2-9): Low Risk   . PHQ-2 Score: 0  Financial Resource Strain: Medium Risk  . Difficulty of Paying Living Expenses: Somewhat hard  Food Insecurity: No Food Insecurity  . Worried About Charity fundraiser in the Last Year: Never true  . Ran Out of Food in the Last Year: Never true  Housing: Low Risk   . Last Housing Risk Score: 0  Physical Activity:   . Days of Exercise per Week: Not on file  .  Minutes of Exercise per Session: Not on file  Social Connections:   . Frequency of Communication with Friends and Family: Not on file  . Frequency of Social Gatherings with Friends and Family: Not on file  . Attends Religious Services: Not on file  . Active Member of Clubs or Organizations: Not on file  . Attends Archivist Meetings: Not on file  . Marital Status: Not on file  Stress:   . Feeling of Stress : Not on file  Tobacco Use: Low Risk   . Smoking Tobacco Use: Never Smoker  . Smokeless Tobacco Use: Never Used  Transportation Needs: No Transportation Needs  . Lack of Transportation (Medical): No  . Lack of Transportation  (Non-Medical): No     Current Diagnosis/Assessment:  Goals Addressed            This Visit's Progress   . Chronic Care Management Pharmacy Care Plan       CARE PLAN ENTRY (see longitudinal plan of care for additional care plan information)  Current Barriers:  . Chronic Disease Management support, education, and care coordination needs related to Diabetes, Hypertension, Hyperlipidemia, Chest Pain, Anxiety, Tremor, Insomnia, GERD   Hypertension BP Readings from Last 3 Encounters:  05/04/20 128/76  02/24/20 130/84  10/23/19 130/82   . Pharmacist Clinical Goal(s): o Over the next 90 days, patient will work with PharmD and providers to maintain BP goal <140/90 . Current regimen:  o Losartan 18m daily . Patient self care activities - Over the next 90 days, patient will: o Maintain hypertension medication regimen.  Hyperlipidemia Lab Results  Component Value Date/Time   LDLCALC 76 02/24/2020 03:52 PM   . Pharmacist Clinical Goal(s): o Over the next 90 days, patient will work with PharmD and providers to maintain LDL goal < 100 . Current regimen:  o Atorvastatin 24mdaily . Patient self care activities - Over the next 90 days, patient will: o Maintain cholesterol medication regimen.  Diabetes Lab Results  Component Value Date/Time   HGBA1C 7.9 (H) 02/24/2020 03:52 PM   HGBA1C 8.3 (H) 10/23/2019 02:41 PM   . Pharmacist Clinical Goal(s): o Over the next 90 days, patient will work with PharmD and providers to achieve A1c goal <7% . Current regimen:  o Ozempic 24m73maily . Interventions: o Titrated ozempic to 24mg22mily o Collaboration with provider regarding medication management (initiation of Farxiga) o Complete patient assistance application for Farxiga if agreeable with Dr. CoplLorelei Pontatient self care activities - Over the next 90 days, patient will: o Check blood sugar twice daily, document, and provide at future appointments o Contact provider with any episodes  of hypoglycemia o Complete PAP for FarxWilder Gladeagreeable with Dr. CoplLorelei Pontdication management . Pharmacist Clinical Goal(s): o Over the next 90 days, patient will work with PharmD and providers to achieve optimal medication adherence . Current pharmacy: [facility] . Interventions o Comprehensive medication review performed. o Utilize [facility] pharmacy for medication synchronization, packaging and delivery . Patient self care activities - Over the next 90 days, patient will: o Focus on medication adherence by filling and taking medications appropriately  o Take medications as prescribed o Report any questions or concerns to PharmD and/or provider(s)  Please see past updates related to this goal by clicking on the "Past Updates" button in the selected goal       Takes care of her mom who is 90. Lost husband in 2020 due to sepsis.  She was married 50 y37rs.  She has a daughter and son.  Has 4 grandchildren.  Fills box up every 4 weeks.   Update 08/10/20 Wonders if she has fully processed the grief of her husband  Hypertension   BP goal is:  <140/90  Office blood pressures are  BP Readings from Last 3 Encounters:  05/04/20 128/76  02/24/20 130/84  10/23/19 130/82   Patient checks BP at home infrequently Patient home BP readings are ranging: Unable to assess  Patient has failed these meds in the past: triamterene/hctz (listed in D/C meds. No apparent reason for D/C) Patient is currently controlled on the following medications:  . Losartan 18m daily   She questions if she is "doubling up" on blood pressure medication considering that she is also taking propranolol. Discussed with patient that while propranolol can affect blood pressure, it would not have a significant impact on her BP and is used more for her tremor.  If concerned she can check her BP more frequently to assure her BP is not going too low.   Plan -Continue current medications    Diabetes   A1c goal <7%   FBG 80-130  Recent Relevant Labs: Lab Results  Component Value Date/Time   HGBA1C 7.9 (H) 02/24/2020 03:52 PM   HGBA1C 8.3 (H) 10/23/2019 02:41 PM   GFR 33.51 (L) 02/24/2020 03:52 PM   GFR 38.10 (L) 10/23/2019 02:41 PM   MICROALBUR 3.1 (H) 06/03/2019 10:07 AM   MICROALBUR 6.7 (H) 10/25/2016 08:29 AM    Last diabetic Eye exam:  Lab Results  Component Value Date/Time   HMDIABEYEEXA No Retinopathy 08/17/2020 12:00 AM    Last diabetic Foot exam: No results found for: HMDIABFOOTEX   Checking BG: 2x per Montez Stryker  Recent FBG Readings: 180-200 yesterday at the lowest BG of 98. Lowest it's been in a long time. Highest about 215.   Patient has failed these meds in past: metformin (renal fx decline) Patient is currently uncontrolled on the following medications:  Ozempic 117mweekly  Called patient's insurance company and confirmed that all Tier 3 medications including Victoza, Ozempic, and Jardiance (FWilder Glades non-formulary on her plan) are $45 for 30 DS $90 for 60 DS and 90 DS  Patient would benefit from filling for a 90 DS at the same cost as a $60 DS.  Victoza Fill Dates 04/20/20 60 DS 02/20/20 60 DS  Has 2 pens remaining (~20 Taelyr Jantz supply)  Januvia Fill Dates 02/03/20 90 DS States this one was costing about $400 Is completely out. Has been out for about 3 weeks  States she has a family hx of diabetes. She also gets very sad about the passing of her husband and tends to eat more.  In 2016 states she was around 130lbs and felt that was too thin.  She is now around 153 and states she would like to be 10-15lbs lighter.   We discussed:  DM goals  How to dose ozempic (0.2584meekly x 4 weeks then move to 0.5mg67mekly)  No need to refill Januvia  Will complete appropriate portions of the PAP for Ozempic. Will place patient portion up front for her to sign at her earliest convenience. Also requested that patient provide proof of income. Will provide portions for Dr. CoplLorelei Pont sign. Will fax completed applications when all portions completed and returned.  Update 06/26/20 Has moved up to Victoza 1.8mg 63mly.  Has been checking her BG twice daily. Very stressed about her husband's death anniversary.  Fasting Bedtime 168  165 184  193 262  218 182   188  190 243  230 168   219  220 191  223 238  188 170   197   170  225 Average 198.4  205.7  Update 08/10/20 She is on her second week of Ozempic. No low or high symptoms.  She is concerned with elevated BG.  Diet B - toast with jam; oatmeal plus toast; eggs with toast L - meatball sub; skinny bagel with chicken salad D - plain chicken wings, 1/2 cup broccoli casserole, and 1/2 mashed potatoes Snacks - No, but reports she ate a caramel a few minutes ago Drinks - 1/2 -1/2 ice tea daily, caffeine free diet coke, reports she doesn't drink much water  Exercise Not as active as when she was in her home Tries to walk dog once-twice a [place] about 30 minutes Does have access to gym at her apt, but doesn't have anyone to go with her.  Update 08/24/20 How many pens of Ozempic remaining? 6 boxes Start 71m on Friday.  Only drinking one bottle of water daily 195 179 205 202 180 201 197  219 154 220 178 182 183 181  Avg 198.4 201.7  BG about the same as last visit. Will increase Ozempic.   Update 09/25/20 Received 154mOzempic pens from PAP? Has not picked up yet. Encouraged pt to pick up today.  How many 0.17m817mens remaining (3 boxes) _0 155 AVG AVG 193.4 205.6  She feels stressed due to losing her husband and being caregiver to her mom. She feels this causes her BG to be elevated. The holiday season is hard for her too. Denies polydipsia, polyphagia, polyuria Discussed her DM control noting that current BG avg might result in a1c higher than previous.  Pt is agreeable with starting  another agent. Consider initiation of FarEmerald Isleonsider initiation of Farxiga 45m62mily pending Dr. CoplLorelei Pontroval  -Complete PAP for FarxWilder Gladeagreeable to Dr. CoplLorelei Pontture Plan -If BG not at goal on max dose of Ozempic, consider completing PAP for patient to get FarxWilder Gladeing it's indication for patients with CKD. Also note that this could be added without regard to a1c, but also note this would increase pill burden. Will have discussion in future.    Hyperlipidemia   LDL goal <100  Lipid Panel     Component Value Date/Time   CHOL 173 02/24/2020 1552   TRIG 199.0 (H) 02/24/2020 1552   HDL 56.60 02/24/2020 1552   LDLCALC 76 02/24/2020 1552    Hepatic Function Latest Ref Rng & Units 10/23/2019 06/03/2019 02/01/2018  Total Protein 6.0 - 8.3 g/dL 6.6 7.0 6.8  Albumin 3.5 - 5.2 g/dL 4.3 4.5 3.9  AST 0 - 37 U/L _1 ALT 0 - 35 U/L _2 Alk Phosphatase 39 - 117 U/L 97 107 66  Total Bilirubin 0.2 - 1.2 mg/dL 0.3 0.4 0.3     The 10-year ASCVD risk score (GofMikey BussingJr., et al., 2013) is: 24.4%   Values used to calculate the score:     Age: 69 y3rs     Sex: Female     Is Non-Hispanic African American: No     Diabetic: Yes     Tobacco smoker: No     Systolic Blood  Pressure: 128 mmHg     Is BP treated: Yes     HDL Cholesterol: 56.6 mg/dL     Total Cholesterol: 173 mg/dL   Patient has failed these meds in past: None noted  Patient is currently controlled on the following medications:  . Atorvastatin 50m daily  Fill Dates 05/02/20 90 DS 02/02/20 90 DS 05/02/20 90 DS  Update 06/26/20 Discussed limiting foods that would increase TRIG  Update 08/26/20 Still tolerating current regimen  Update 09/25/20 Tolerating therapy  Plan -Continue current medications    Miscellaneous Meds  Zinc gluconate 572mMultivitamin Vitamin C 28233mitamin K2 100m61mtamin D 1000 units [facility] When called earlier this month, patient states  she has sufficient qty of medications although based on fill dates she should be out of medication. Will rework pt's sych plan.   Remaining Balances of Medication as of 09/25/20:  Atorvastatin #31 Losartan #95 Omeprazole #90 From [facility] on 11/9 Propranolol #56 Venlafaxine #73 total: #42 from [facility] #31 from [facility] Trazodone #24  KanesDe BlanchrmD Clinical Pharmacist [phone]

## 2020-09-25 NOTE — Patient Instructions (Addendum)
Visit Information  Goals Addressed            This Visit's Progress   . Chronic Care Management Pharmacy Care Plan       CARE PLAN ENTRY (see longitudinal plan of care for additional care plan information)  Current Barriers:  . Chronic Disease Management support, education, and care coordination needs related to Diabetes, Hypertension, Hyperlipidemia, Chest Pain, Anxiety, Tremor, Insomnia, GERD   Hypertension BP Readings from Last 3 Encounters:  05/04/20 128/76  02/24/20 130/84  10/23/19 130/82   . Pharmacist Clinical Goal(s): o Over the next 90 days, patient will work with PharmD and providers to maintain BP goal <140/90 . Current regimen:  o Losartan 25mg  daily . Patient self care activities - Over the next 90 days, patient will: o Maintain hypertension medication regimen.  Hyperlipidemia Lab Results  Component Value Date/Time   LDLCALC 76 02/24/2020 03:52 PM   . Pharmacist Clinical Goal(s): o Over the next 90 days, patient will work with PharmD and providers to maintain LDL goal < 100 . Current regimen:  o Atorvastatin 20mg  daily . Patient self care activities - Over the next 90 days, patient will: o Maintain cholesterol medication regimen.  Diabetes Lab Results  Component Value Date/Time   HGBA1C 7.9 (H) 02/24/2020 03:52 PM   HGBA1C 8.3 (H) 10/23/2019 02:41 PM   . Pharmacist Clinical Goal(s): o Over the next 90 days, patient will work with PharmD and providers to achieve A1c goal <7% . Current regimen:  o Ozempic 1mg  daily . Interventions: o Titrated ozempic to 1mg  daily o Collaboration with provider regarding medication management (initiation of Farxiga) o Complete patient assistance application for Farxiga if agreeable with Dr. Lorelei Pont . Patient self care activities - Over the next 90 days, patient will: o Check blood sugar twice daily, document, and provide at future appointments o Contact provider with any episodes of hypoglycemia o Complete PAP for  Wilder Glade if agreeable with Dr. Lorelei Pont  Medication management . Pharmacist Clinical Goal(s): o Over the next 90 days, patient will work with PharmD and providers to achieve optimal medication adherence . Current pharmacy: [facility] . Interventions o Comprehensive medication review performed. o Utilize [facility] pharmacy for medication synchronization, packaging and delivery . Patient self care activities - Over the next 90 days, patient will: o Focus on medication adherence by filling and taking medications appropriately  o Take medications as prescribed o Report any questions or concerns to PharmD and/or provider(s)  Please see past updates related to this goal by clicking on the "Past Updates" button in the selected goal         The patient verbalized understanding of instructions, educational materials, and care plan provided today and declined offer to receive copy of patient instructions, educational materials, and care plan.   Telephone follow up appointment with pharmacy team member scheduled for: 10/26/2020  Melvenia Beam Mysty Kielty, PharmD Clinical Pharmacist [facility] [phone]

## 2020-10-01 NOTE — Progress Notes (Addendum)
[facility] [address], [address] [place], [place] [address] [phone]  Date:  10/05/2020   Name:  Abigail [name]   DOB:  Jan 16, 1951   MRN:  [id number]  PCP:  Pearline Cables, MD    Chief Complaint: Annual Exam   History of Present Illness:  Abigail [name] is a 69 y.o. very pleasant female patient who presents with the following:  Pt seen today for a CPE- last seen by myself in April  She is a widow. I also take care of her elderly mother History of hypertension, diabetes, CKD, hyperlipidemia, serous ankle fracture in 2016 requiring surgery  She has a son who lives locally, her daughter lives in [place] Abigail [name] has been living with her mother who is 90 yo- she is still pretty independent but it is helpful for Abigail [name] to be near to her.   They recently sold Abigail [name]'s house and moved into an apartment. Abigail [name] is no longer working, this allows her to take care of her mom Flu vaccine- done  A1c due today covid booster done  shingrix- ? Did she get her 2nd dose - not yet, she will do at pharmacy  Nephrologist is at [facility]- last visit in September with Dr Kathrene Bongo  Today pt notes she is feeling a lot of stress and anxiety.  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  No suicidal ideation We had her prozac until a couple of year ago-she would be willing to go back on this medication  She also plans to discuss her mother's care with her brother.  We discussed some ideas such as having her brother keep her mom part of the time, and possibly hiring some help to get Abigail [name] more freedom  She is working with the pharm D for her DM control as  well Started on ozempic about 2 months ago - we plan to add [place] as well   Lab Results  Component Value Date   HGBA1C 7.9 (H) 02/24/2020    Patient Active Problem List   Diagnosis Date Noted  . 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/29/2020  . Essential hypertension   . Gastroesophageal reflux disease without esophagitis   . Microscopic hematuria 09/16/2015  . Trimalleolar fracture of ankle, closed 11/17/2014  . Other and unspecified hyperlipidemia 07/24/2013  . Chest pain 10/23/2012  . Diabetes mellitus (HCC) 10/08/2012  . Anxiety 10/08/2012    Past Medical History:  Diagnosis Date  . Anemia, unspecified   . Anxiety   . CKD (chronic kidney disease), stage III (HCC)   . Depression   . Diabetes mellitus    Type 2  . GERD (gastroesophageal reflux disease)   . Hematuria   . Hyperlipidemia   . Hypertension   . Mild mitral regurgitation   . Normal coronary arteries    a. By cardiac CT 08/2017.  . 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, Dorcas Mcmurray    Past Surgical History:  Procedure Laterality Date  . CHOLECYSTECTOMY N/A 06/03/2015   Procedure: LAPAROSCOPIC CHOLECYSTECTOMY WITH INTRAOPERATIVE CHOLANGIOGRAM;  Surgeon: Manus Rudd, MD;  Location: [facility] OR;  Service: General;  Laterality: N/A;  .  COLONOSCOPY    . FRACTURE SURGERY    . HAMMER TOE SURGERY    . ORIF ANKLE FRACTURE Left 11/17/2014   Procedure: OPEN REDUCTION INTERNAL FIXATION (ORIF) LEFT TRIMALLEOLAR ANKLE FRACTURE;  Surgeon: Marianna Payment, MD;  Location: [facility];  Service: Orthopedics;  Laterality: Left;  . OVARIAN CYST REMOVAL      Social History   Tobacco Use  . Smoking status: Never Smoker  . Smokeless tobacco: Never Used  Vaping Use  . Vaping Use: Never used  Substance Use Topics  . Alcohol use: Yes    Alcohol/week: 1.0 standard drink    Types: 1 Glasses of wine per week  . Drug use: No    Family History  Problem Relation Age of Onset  . Diabetes Mother   . Hyperlipidemia Mother   . Diabetes Father   .  Heart disease Father 38       AMI x 2; first AMI age 25  . Stroke Father   . Heart disease Brother        defibrillator; CHF  . Hyperlipidemia Brother     Allergies  Allergen Reactions  . Pneumovax 23 [Pneumococcal Vac Polyvalent] Other (See Comments)    Pt had severe arm pain  . Amaryl [Glimepiride] Nausea Only  . Cephalexin Hives  . Sulfa Antibiotics Rash    Medication list has been reviewed and updated.  Current Outpatient Medications on File Prior to Visit  Medication Sig Dispense Refill  . Accu-Chek FastClix Lancets MISC USE TO TEST BLOOD SUGAR UP TO FOUR TIMES DAILY AS DIRECTED 306 each 2  . ACCU-CHEK GUIDE test strip USE TO CHECK BLOOD SUGAR UP TO FOUR TIMES DAILY 300 strip 2  . Ascorbic Acid (VITAMIN C) 1000 MG tablet Take 1,000 mg by mouth daily.    [name] atorvastatin (LIPITOR) 20 MG tablet TAKE 1 TABLET(20 MG) BY MOUTH DAILY 90 tablet 3  . blood glucose meter kit and supplies Dispense based on patient and insurance preference. Use up to four times daily as directed. (FOR ICD-10 E10.9, E11.9). 1 each 0  . Blood Glucose Monitoring Suppl (ONE TOUCH ULTRA 2) w/Device KIT Use to check glucose up to 4 times daily. Dx Code E11.9 1 kit 0  . glucose blood (ONETOUCH ULTRA) test strip Use to check glucose up to 4 times daily. Dx Code E11.9 400 each 1  . losartan (COZAAR) 25 MG tablet TAKE 1 TABLET(25 MG) BY MOUTH DAILY 90 tablet 3  . Multiple Vitamins-Minerals (ALIVE WOMENS GUMMY) CHEW Chew 2 tablets by mouth daily.    . nitroGLYCERIN (NITROSTAT) 0.4 MG SL tablet PLACE 1 TABLET UNDER THE TONGUE EVERY 5 MINUTES AS NEEDED FOR CHEST PAIN 25 tablet 2  . omeprazole (PRILOSEC) 20 MG capsule TAKE 1 CAPSULE(20 MG) BY MOUTH DAILY 90 capsule 3  . OneTouch Delica Lancets 75Z MISC Use to check glucose up to 4 times daily. Dx Code E11.9 400 each 1  . propranolol (INDERAL) 10 MG tablet TAKE 1 TABLET BY MOUTH EVERY 8-12 HOURS AS NEEDED FOR TREMOR 270 tablet 0  . Semaglutide, 1 MG/DOSE, (OZEMPIC, 1  MG/DOSE,) 2 MG/1.5ML SOPN Inject 1 mg into the skin once a week. Getting through PAP    . traZODone (DESYREL) 150 MG tablet Take 1 tablet (150 mg total) by mouth at bedtime. 90 tablet 3  . zinc gluconate 50 MG tablet Take 50 mg by mouth daily.     No current facility-administered medications on file prior to visit.  Review of Systems:  As per HPI- otherwise negative.   Physical Examination: Vitals:   10/05/20 0901  BP: 122/72  Pulse: 95  Resp: 16  SpO2: 95%   Vitals:   10/05/20 0901  Weight: 152 lb (68.9 kg)  Height: 5' 6"  (1.676 m)   Body mass index is 24.53 kg/m. Ideal Body Weight: Weight in (lb) to have BMI = 25: 154.6  GEN: no acute distress.  Normal weight, looks well but is tearful today  HEENT: Atraumatic, Normocephalic.  Ears and Nose: No external deformity. CV: RRR, No M/G/R. No JVD. No thrill. No extra heart sounds. PULM: CTA B, no wheezes, crackles, rhonchi. No retractions. No resp. distress. No accessory muscle use. ABD: S, NT, ND, +BS. No rebound. No HSM. EXTR: No c/c/e PSYCH: Normally interactive. Conversant.    Assessment and Plan: Physical exam  Diabetes mellitus type 2 in nonobese ([place]) - Plan: Hemoglobin A1c  Benign essential HTN - Plan: CBC, Comprehensive metabolic panel  Dyslipidemia  Caregiver stress - Plan: FLUoxetine (PROZAC) 20 MG capsule  Adjustment reaction with anxiety and depression - Plan: FLUoxetine (PROZAC) 20 MG capsule  Here today for complete physical Labs are pending as above Discussed health maintenance, she plans to get a shingles vaccine at her pharmacy Encouraged continued healthy diet and exercise program  Main concern today is stress, anxiety and depression [name] denies any suicidal ideation She is taking venlafaxine is still struggling a lot with depression and irritability  We will have her taper off venlafaxine, start on fluoxetine 20.  May increase to 40 mg after 2 to 3 weeks I have asked her to contact me  in about 2 weeks to check how she is doing-sooner if not doing okay  We discussed some concrete steps she can take to help lighten her load as a caregiver  This visit occurred during the SARS-CoV-2 public health emergency.  Safety protocols were in place, including screening questions prior to the visit, additional usage of staff PPE, and extensive cleaning of exam room while observing appropriate contact time as indicated for disinfecting solutions.   Will plan further follow- up pending labs.   Signed Lamar Blinks, MD  Received her labs, message to pt  Renal function is stable   Results for orders placed or performed in visit on 10/05/20  CBC  Result Value Ref Range   WBC 6.7 4.0 - 10.5 K/uL   RBC 4.62 3.87 - 5.11 Mil/uL   Platelets 205.0 150 - 400 K/uL   Hemoglobin 13.1 12.0 - 15.0 g/dL   HCT 38.9 36 - 46 %   MCV 84.2 78.0 - 100.0 fl   MCHC 33.6 30.0 - 36.0 g/dL   RDW 13.5 11.5 - 15.5 %  Comprehensive metabolic panel  Result Value Ref Range   Sodium 135 135 - 145 mEq/L   Potassium 4.5 3.5 - 5.1 mEq/L   Chloride 98 96 - 112 mEq/L   CO2 30 19 - 32 mEq/L   Glucose, Bld 219 (H) 70 - 99 mg/dL   BUN 25 (H) 6 - 23 mg/dL   Creatinine, Ser 1.47 (H) 0.40 - 1.20 mg/dL   Total Bilirubin 0.4 0.2 - 1.2 mg/dL   Alkaline Phosphatase 85 39 - 117 U/L   AST 16 0 - 37 U/L   ALT 13 0 - 35 U/L   Total Protein 6.7 6.0 - 8.3 g/dL   Albumin 4.2 3.5 - 5.2 g/dL   GFR 35.80 (L) >60.00 mL/min   Calcium 8.7  8.4 - 10.5 mg/dL  Hemoglobin A1c  Result Value Ref Range   Hgb A1c MFr Bld 7.9 (H) 4.6 - 6.5 %

## 2020-10-05 ENCOUNTER — Other Ambulatory Visit: Payer: Self-pay

## 2020-10-05 ENCOUNTER — Encounter: Payer: Self-pay | Admitting: Family Medicine

## 2020-10-05 ENCOUNTER — Ambulatory Visit (INDEPENDENT_AMBULATORY_CARE_PROVIDER_SITE_OTHER): Payer: PPO | Admitting: Family Medicine

## 2020-10-05 VITALS — BP 122/72 | HR 95 | Resp 16 | Ht 66.0 in | Wt 152.0 lb

## 2020-10-05 DIAGNOSIS — Z636 Dependent relative needing care at home: Secondary | ICD-10-CM | POA: Diagnosis not present

## 2020-10-05 DIAGNOSIS — I10 Essential (primary) hypertension: Secondary | ICD-10-CM | POA: Diagnosis not present

## 2020-10-05 DIAGNOSIS — E785 Hyperlipidemia, unspecified: Secondary | ICD-10-CM | POA: Diagnosis not present

## 2020-10-05 DIAGNOSIS — Z0000 Encounter for general adult medical examination without abnormal findings: Secondary | ICD-10-CM

## 2020-10-05 DIAGNOSIS — E119 Type 2 diabetes mellitus without complications: Secondary | ICD-10-CM | POA: Diagnosis not present

## 2020-10-05 DIAGNOSIS — F4323 Adjustment disorder with mixed anxiety and depressed mood: Secondary | ICD-10-CM | POA: Diagnosis not present

## 2020-10-05 LAB — CBC
HCT: 38.9 % (ref 36.0–46.0)
Hemoglobin: 13.1 g/dL (ref 12.0–15.0)
MCHC: 33.6 g/dL (ref 30.0–36.0)
MCV: 84.2 fl (ref 78.0–100.0)
Platelets: 205 10*3/uL (ref 150.0–400.0)
RBC: 4.62 Mil/uL (ref 3.87–5.11)
RDW: 13.5 % (ref 11.5–15.5)
WBC: 6.7 10*3/uL (ref 4.0–10.5)

## 2020-10-05 LAB — COMPREHENSIVE METABOLIC PANEL
ALT: 13 U/L (ref 0–35)
AST: 16 U/L (ref 0–37)
Albumin: 4.2 g/dL (ref 3.5–5.2)
Alkaline Phosphatase: 85 U/L (ref 39–117)
BUN: 25 mg/dL — ABNORMAL HIGH (ref 6–23)
CO2: 30 mEq/L (ref 19–32)
Calcium: 8.7 mg/dL (ref 8.4–10.5)
Chloride: 98 mEq/L (ref 96–112)
Creatinine, Ser: 1.47 mg/dL — ABNORMAL HIGH (ref 0.40–1.20)
GFR: 35.8 mL/min — ABNORMAL LOW (ref >60.00)
Glucose, Bld: 219 mg/dL — ABNORMAL HIGH (ref 70–99)
Potassium: 4.5 mEq/L (ref 3.5–5.1)
Sodium: 135 mEq/L (ref 135–145)
Total Bilirubin: 0.4 mg/dL (ref 0.2–1.2)
Total Protein: 6.7 g/dL (ref 6.0–8.3)

## 2020-10-05 LAB — HEMOGLOBIN A1C: Hgb A1c MFr Bld: 7.9 % — ABNORMAL HIGH (ref 4.6–6.5)

## 2020-10-05 MED ORDER — FLUOXETINE HCL 20 MG PO CAPS: 20.0000 mg | ORAL_CAPSULE | Freq: Every day | ORAL | 3 refills | Status: DC

## 2020-10-05 NOTE — Patient Instructions (Signed)
It was good to see you again today-  I am sorry you are struggling.  I do think you have a reason to feel overwhelmed Let's have you come off venlafaxine and change back to Prozac (fluoxetine) Take the venlafaxine every other day for 2-3 doses and then stop, start on the Prozac at 20 mg daily.  You can increase to 40 mg of Prozac after 2 weeks  Work on regular exercise- this will help with stress I would also encourage you to have your brother share the load of caring for you mom, and perhaps hire some help   You are all up to date except for your 2nd dose of shingles vaccine I will be in touch with your labs    Please update me in 2 weeks and let me know how you are doing- sooner if not doing ok   Health Maintenance After Age 74 After age 53, you are at a higher risk for certain long-term diseases and infections as well as injuries from falls. Falls are a major cause of broken bones and head injuries in people who are older than age 72. Getting regular preventive care can help to keep you healthy and well. Preventive care includes getting regular testing and making lifestyle changes as recommended by your health care provider. Talk with your health care provider about:  Which screenings and tests you should have. A screening is a test that checks for a disease when you have no symptoms.  A diet and exercise plan that is right for you. What should I know about screenings and tests to prevent falls? Screening and testing are the best ways to find a health problem early. Early diagnosis and treatment give you the best chance of managing medical conditions that are common after age 21. Certain conditions and lifestyle choices may make you more likely to have a fall. Your health care provider may recommend:  Regular vision checks. Poor vision and conditions such as cataracts can make you more likely to have a fall. If you wear glasses, make sure to get your prescription updated if your vision  changes.  Medicine review. Work with your health care provider to regularly review all of the medicines you are taking, including over-the-counter medicines. Ask your health care provider about any side effects that may make you more likely to have a fall. Tell your health care provider if any medicines that you take make you feel dizzy or sleepy.  Osteoporosis screening. Osteoporosis is a condition that causes the bones to get weaker. This can make the bones weak and cause them to break more easily.  Blood pressure screening. Blood pressure changes and medicines to control blood pressure can make you feel dizzy.  Strength and balance checks. Your health care provider may recommend certain tests to check your strength and balance while standing, walking, or changing positions.  Foot health exam. Foot pain and numbness, as well as not wearing proper footwear, can make you more likely to have a fall.  Depression screening. You may be more likely to have a fall if you have a fear of falling, feel emotionally low, or feel unable to do activities that you used to do.  Alcohol use screening. Using too much alcohol can affect your balance and may make you more likely to have a fall. What actions can I take to lower my risk of falls? General instructions  Talk with your health care provider about your risks for falling. Tell your health care  provider if: ? You fall. Be sure to tell your health care provider about all falls, even ones that seem minor. ? You feel dizzy, sleepy, or off-balance.  Take over-the-counter and prescription medicines only as told by your health care provider. These include any supplements.  Eat a healthy diet and maintain a healthy weight. A healthy diet includes low-fat dairy products, low-fat (lean) meats, and fiber from whole grains, beans, and lots of fruits and vegetables. Home safety  Remove any tripping hazards, such as rugs, cords, and clutter.  Install safety  equipment such as grab bars in bathrooms and safety rails on stairs.  Keep rooms and walkways well-lit. Activity   Follow a regular exercise program to stay fit. This will help you maintain your balance. Ask your health care provider what types of exercise are appropriate for you.  If you need a cane or walker, use it as recommended by your health care provider.  Wear supportive shoes that have nonskid soles. Lifestyle  Do not drink alcohol if your health care provider tells you not to drink.  If you drink alcohol, limit how much you have: ? 0-1 drink a day for women. ? 0-2 drinks a day for men.  Be aware of how much alcohol is in your drink. In the [place] one drink equals one typical bottle of beer (12 oz), one-half glass of wine (5 oz), or one shot of hard liquor (1 oz).  Do not use any products that contain nicotine or tobacco, such as cigarettes and e-cigarettes. If you need help quitting, ask your health care provider. Summary  Having a healthy lifestyle and getting preventive care can help to protect your health and wellness after age 51.  Screening and testing are the best way to find a health problem early and help you avoid having a fall. Early diagnosis and treatment give you the best chance for managing medical conditions that are more common for people who are older than age 21.  Falls are a major cause of broken bones and head injuries in people who are older than age 3. Take precautions to prevent a fall at home.  Work with your health care provider to learn what changes you can make to improve your health and wellness and to prevent falls. This information is not intended to replace advice given to you by your health care provider. Make sure you discuss any questions you have with your health care provider. Document Revised: 02/14/2019 Document Reviewed: 09/06/2017 Elsevier Patient Education  2020 [employer].

## 2020-10-08 ENCOUNTER — Telehealth: Payer: Self-pay | Admitting: Pharmacist

## 2020-10-08 NOTE — Progress Notes (Addendum)
Chronic Care Management Pharmacy Assistant   Name: Abigail [name]  MRN: [id number] DOB: 1951-02-26  Reason for Encounter: Medication Review   PCP : Darreld Mclean, MD  Allergies:   Allergies  Allergen Reactions   Pneumovax 23 [Pneumococcal Vac Polyvalent] Other (See Comments)    Pt had severe arm pain   Amaryl [Glimepiride] Nausea Only   Cephalexin Hives   Sulfa Antibiotics Rash    Medications: Outpatient Encounter Medications as of 10/08/2020  Medication Sig Note   Accu-Chek FastClix Lancets MISC USE TO TEST BLOOD SUGAR UP TO FOUR TIMES DAILY AS DIRECTED    ACCU-CHEK GUIDE test strip USE TO CHECK BLOOD SUGAR UP TO FOUR TIMES DAILY    Ascorbic Acid (VITAMIN C) 1000 MG tablet Take 1,000 mg by mouth daily.    atorvastatin (LIPITOR) 20 MG tablet TAKE 1 TABLET(20 MG) BY MOUTH DAILY    blood glucose meter kit and supplies Dispense based on patient and insurance preference. Use up to four times daily as directed. (FOR ICD-10 E10.9, E11.9).    Blood Glucose Monitoring Suppl (ONE TOUCH ULTRA 2) w/Device KIT Use to check glucose up to 4 times daily. Dx Code E11.9    FLUoxetine (PROZAC) 20 MG capsule Take 1 capsule (20 mg total) by mouth daily. Increase to 40 mg after 2 weeks    glucose blood (ONETOUCH ULTRA) test strip Use to check glucose up to 4 times daily. Dx Code E11.9    losartan (COZAAR) 25 MG tablet TAKE 1 TABLET(25 MG) BY MOUTH DAILY    Multiple Vitamins-Minerals (ALIVE WOMENS GUMMY) CHEW Chew 2 tablets by mouth daily.    nitroGLYCERIN (NITROSTAT) 0.4 MG SL tablet PLACE 1 TABLET UNDER THE TONGUE EVERY 5 MINUTES AS NEEDED FOR CHEST PAIN 06/12/2020: Has not had to use, but has it just in case   omeprazole (PRILOSEC) 20 MG capsule TAKE 1 CAPSULE(20 MG) BY MOUTH DAILY    OneTouch Delica Lancets 40X MISC Use to check glucose up to 4 times daily. Dx Code E11.9    propranolol (INDERAL) 10 MG tablet TAKE 1 TABLET BY MOUTH EVERY 8-12 HOURS AS NEEDED FOR TREMOR    Semaglutide, 1  MG/DOSE, (OZEMPIC, 1 MG/DOSE,) 2 MG/1.5ML SOPN Inject 1 mg into the skin once a week. Getting through PAP    traZODone (DESYREL) 150 MG tablet Take 1 tablet (150 mg total) by mouth at bedtime.    zinc gluconate 50 MG tablet Take 50 mg by mouth daily.    No facility-administered encounter medications on file as of 10/08/2020.    Current Diagnosis: Patient Active Problem List   Diagnosis Date Noted   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/29/2020   Essential hypertension    Gastroesophageal reflux disease without esophagitis    Microscopic hematuria 09/16/2015   Trimalleolar fracture of ankle, closed 11/17/2014   Other and unspecified hyperlipidemia 07/24/2013   Chest pain 10/23/2012   Diabetes mellitus ([place]) 10/08/2012   Anxiety 10/08/2012    Goals Addressed   None    Reviewed chart for medication changes ahead of medication coordination call.  Office Visits: 10-05-2020 (PCP) Patient presented in the office for her physical exam. Patient reported feeling a lot of stress and anxiety. 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 Per Dr. Lorelei Pont she would like for her to her taper off venlafaxine, start on fluoxetine 20.  May increase to 40 mg after  2 to 3 weeks  Lab work ordered: CMP and A1C. Medication changes: Fluoxetine 20 mg daily, increase to 40 mg after two weeks. Venlafaxine 150 mg was discontinued.  No Consults, or hospital visits since last Pharmacist visit.   Medication changes indicated.  BP Readings from Last 3 Encounters:  10/05/20 122/72  05/04/20 128/76  02/24/20 130/84    Lab Results  Component Value Date   HGBA1C 7.9 (H) 10/05/2020     Patient obtains medications through Vials  90 Days   Last adherence delivery included:  Omeprazole 20 mg  Patient declined the following medications last month due to sufficient quantity on hand. Last filled 07/28/20 for 49 DS to get medications  synched.  Atorvastatin 20 mg 20 left Losartan 25 mg  Propranolol 10 mg (has plenty) Venlafaxine 150 mg (d/c'd) Trazodone 150 mg-Vial 12 left  Patient is due for next adherence delivery on: 10-16-2020. Called patient and reviewed medications and coordinated delivery.  This delivery to include: Patient will need a short fill of Trazodone 150 mg  Atorvastatin 20 mg. to align with sync date of 12-16-2020 with her next adherence delivery.   Patient declined the following medications: Venlafaxine 150 mg (d/c'd) Losartan 25 mg (received a 90 ds on 09-15-2020) Omeprazole 20 mg (received a 90 ds on 09-15-2020) Propranolol 10 mg (has plenty)  Patient does not need any refills at this time.  Confirmed delivery date of 10-16-2020, advised patient that pharmacy will contact them the morning of delivery.  Patient will start taking Fluoxetine 20 mg tomorrow. That medication was sent to [facility], not [facility]   Follow-Up:  Pharmacist Review   Fanny Skates, [place] Pharmacist Assistant [phone]  Note that we will eventually need to synch fluoxetine for patient at [facility].  Completed her patient assistance application for Farxiga and faxed to [employer].  Time spent in review, coordination, and documentation: 20 mins  Reviewed by: De Blanch, PharmD, BCACP Clinical Pharmacist [facility] [phone]

## 2020-10-26 ENCOUNTER — Ambulatory Visit: Payer: PPO | Admitting: Pharmacist

## 2020-10-26 DIAGNOSIS — I10 Essential (primary) hypertension: Secondary | ICD-10-CM

## 2020-10-26 DIAGNOSIS — E785 Hyperlipidemia, unspecified: Secondary | ICD-10-CM

## 2020-10-26 DIAGNOSIS — E119 Type 2 diabetes mellitus without complications: Secondary | ICD-10-CM

## 2020-10-26 NOTE — Patient Instructions (Addendum)
Visit Information  Goals Addressed            This Visit's Progress   . Chronic Care Management Pharmacy Care Plan       CARE PLAN ENTRY (see longitudinal plan of care for additional care plan information)  Current Barriers:  . Chronic Disease Management support, education, and care coordination needs related to Diabetes, Hypertension, Hyperlipidemia, Chest Pain, Anxiety, Tremor, Insomnia, GERD   Hypertension BP Readings from Last 3 Encounters:  10/05/20 122/72  05/04/20 128/76  02/24/20 130/84   . Pharmacist Clinical Goal(s): o Over the next 90 days, patient will work with PharmD and providers to maintain BP goal <140/90 . Current regimen:  o Losartan 25mg  daily . Patient self care activities - Over the next 90 days, patient will: o Maintain hypertension medication regimen.  Hyperlipidemia Lab Results  Component Value Date/Time   LDLCALC 76 02/24/2020 03:52 PM   . Pharmacist Clinical Goal(s): o Over the next 90 days, patient will work with PharmD and providers to maintain LDL goal < 100 . Current regimen:  o Atorvastatin 20mg  daily . Patient self care activities - Over the next 90 days, patient will: o Maintain cholesterol medication regimen.  Diabetes Lab Results  Component Value Date/Time   HGBA1C 7.9 (H) 10/05/2020 09:33 AM   HGBA1C 7.9 (H) 02/24/2020 03:52 PM   . Pharmacist Clinical Goal(s): o Over the next 90 days, patient will work with PharmD and providers to achieve A1c goal <7% . Current regimen:  o Ozempic 1mg  daily . Interventions: o Titrated ozempic to 1mg  daily o Collaboration with provider regarding medication management (initiation of Farxiga) o Complete patient assistance application for Farxiga if agreeable with Dr. Lorelei Pont . Patient self care activities - Over the next 90 days, patient will: o Check blood sugar twice daily, document, and provide at future appointments o Contact provider with any episodes of hypoglycemia o Complete PAP for  [place] if agreeable with Dr. Lorelei Pont  Medication management . Pharmacist Clinical Goal(s): o Over the next 90 days, patient will work with PharmD and providers to achieve optimal medication adherence . Current pharmacy: [facility] . Interventions o Comprehensive medication review performed. o Utilize [facility] pharmacy for medication synchronization, packaging and delivery . Patient self care activities - Over the next 90 days, patient will: o Focus on medication adherence by filling and taking medications appropriately  o Take medications as prescribed o Report any questions or concerns to PharmD and/or provider(s)  Please see past updates related to this goal by clicking on the "Past Updates" button in the selected goal         The patient verbalized understanding of instructions, educational materials, and care plan provided today and agreed to receive a mailed copy of patient instructions, educational materials, and care plan.   Telephone follow up appointment with pharmacy team member scheduled for: 01/25/2021  Melvenia Beam Avey Mcmanamon, [facility]

## 2020-10-26 NOTE — Chronic Care Management (AMB) (Addendum)
Chronic Care Management Pharmacy  Name: Abigail [name]  MRN: [id number] DOB: 1951/03/18   Chief Complaint/ HPI  Abigail [name],  69 y.o. , female presents for their Follow-Up CCM visit with the clinical pharmacist via telephone due to COVID-19 Pandemic.  PCP : Darreld Mclean, MD  Their chronic conditions include: Diabetes, Hypertension, Hyperlipidemia, Chest Pain, Anxiety, Tremor, Insomnia, GERD  Office Visits: 10/05/20: Visit w/ Dr. Lorelei Pont - Pt struggling with depression while taking venlafaxine. Taper venlafaxine, start fluoxetine 63m, can increase to 422mafter 2-3 weeks. Discussed caregiver options regarding her mother. 2nd Shingrix vaccine needed.   Consult Visit: None since last CCM visit on 09/25/20.   Medications: Outpatient Encounter Medications as of 10/26/2020  Medication Sig Note  . Accu-Chek FastClix Lancets MISC USE TO TEST BLOOD SUGAR UP TO FOUR TIMES DAILY AS DIRECTED   . ACCU-CHEK GUIDE test strip USE TO CHECK BLOOD SUGAR UP TO FOUR TIMES DAILY   . Ascorbic Acid (VITAMIN C) 1000 MG tablet Take 1,000 mg by mouth daily.   . Marland Kitchentorvastatin (LIPITOR) 20 MG tablet TAKE 1 TABLET(20 MG) BY MOUTH DAILY   . blood glucose meter kit and supplies Dispense based on patient and insurance preference. Use up to four times daily as directed. (FOR ICD-10 E10.9, E11.9).   . Marland Kitchenlood Glucose Monitoring Suppl (ONE TOUCH ULTRA 2) w/Device KIT Use to check glucose up to 4 times daily. Dx Code E11.9   . FLUoxetine (PROZAC) 20 MG capsule Take 1 capsule (20 mg total) by mouth daily. Increase to 40 mg after 2 weeks   . glucose blood (ONETOUCH ULTRA) test strip Use to check glucose up to 4 times daily. Dx Code E11.9   . losartan (COZAAR) 25 MG tablet TAKE 1 TABLET(25 MG) BY MOUTH DAILY   . Multiple Vitamins-Minerals (ALIVE WOMENS GUMMY) CHEW Chew 2 tablets by mouth daily.   . nitroGLYCERIN (NITROSTAT) 0.4 MG SL tablet PLACE 1 TABLET UNDER THE TONGUE EVERY 5 MINUTES AS NEEDED FOR  CHEST PAIN 06/12/2020: Has not had to use, but has it just in case  . omeprazole (PRILOSEC) 20 MG capsule TAKE 1 CAPSULE(20 MG) BY MOUTH DAILY   . OneTouch Delica Lancets 3375FISC Use to check glucose up to 4 times daily. Dx Code E11.9   . propranolol (INDERAL) 10 MG tablet TAKE 1 TABLET BY MOUTH EVERY 8-12 HOURS AS NEEDED FOR TREMOR   . Semaglutide, 1 MG/DOSE, (OZEMPIC, 1 MG/DOSE,) 2 MG/1.5ML SOPN Inject 1 mg into the skin once a week. Getting through PAP   . traZODone (DESYREL) 150 MG tablet Take 1 tablet (150 mg total) by mouth at bedtime.   . Marland Kitcheninc gluconate 50 MG tablet Take 50 mg by mouth daily.    No facility-administered encounter medications on file as of 10/26/2020.   SDOH Screenings   Alcohol Screen: Not on file  Depression (PHQ2-9): Low Risk   . PHQ-2 Score: 4  Financial Resource Strain: Medium Risk  . Difficulty of Paying Living Expenses: Somewhat hard  Food Insecurity: No Food Insecurity  . Worried About RuCharity fundraisern the Last Year: Never true  . Ran Out of Food in the Last Year: Never true  Housing: Low Risk   . Last Housing Risk Score: 0  Physical Activity: Not on file  Social Connections: Not on file  Stress: Not on file  Tobacco Use: Low Risk   . Smoking Tobacco Use: Never Smoker  . Smokeless Tobacco Use: Never Used  Transportation  Needs: No Transportation Needs  . Lack of Transportation (Medical): No  . Lack of Transportation (Non-Medical): No     Current Diagnosis/Assessment:  Goals Addressed            This Visit's Progress   . Chronic Care Management Pharmacy Care Plan       CARE PLAN ENTRY (see longitudinal plan of care for additional care plan information)  Current Barriers:  . Chronic Disease Management support, education, and care coordination needs related to Diabetes, Hypertension, Hyperlipidemia, Chest Pain, Anxiety, Tremor, Insomnia, GERD   Hypertension BP Readings from Last 3 Encounters:  10/05/20 122/72  05/04/20 128/76   02/24/20 130/84   . Pharmacist Clinical Goal(s): o Over the next 90 days, patient will work with PharmD and providers to maintain BP goal <140/90 . Current regimen:  o Losartan $RemoveBe'25mg'iDSQantRM$  daily . Patient self care activities - Over the next 90 days, patient will: o Maintain hypertension medication regimen.  Hyperlipidemia Lab Results  Component Value Date/Time   LDLCALC 76 02/24/2020 03:52 PM   . Pharmacist Clinical Goal(s): o Over the next 90 days, patient will work with PharmD and providers to maintain LDL goal < 100 . Current regimen:  o Atorvastatin $RemoveBefore'20mg'aKClSiuTqAiVR$  daily . Patient self care activities - Over the next 90 days, patient will: o Maintain cholesterol medication regimen.  Diabetes Lab Results  Component Value Date/Time   HGBA1C 7.9 (H) 10/05/2020 09:33 AM   HGBA1C 7.9 (H) 02/24/2020 03:52 PM   . Pharmacist Clinical Goal(s): o Over the next 90 days, patient will work with PharmD and providers to achieve A1c goal <7% . Current regimen:  o Ozempic $RemoveB'1mg'KgauSQoI$  daily . Interventions: o Titrated ozempic to $RemoveBe'1mg'FOTEtuumn$  daily o Collaboration with provider regarding medication management (initiation of Farxiga) o Complete patient assistance application for Farxiga if agreeable with Dr. Lorelei Pont . Patient self care activities - Over the next 90 days, patient will: o Check blood sugar twice daily, document, and provide at future appointments o Contact provider with any episodes of hypoglycemia o Complete PAP for Wilder Glade if agreeable with Dr. Lorelei Pont  Medication management . Pharmacist Clinical Goal(s): o Over the next 90 days, patient will work with PharmD and providers to achieve optimal medication adherence . Current pharmacy: [facility] . Interventions o Comprehensive medication review performed. o Utilize [facility] pharmacy for medication synchronization, packaging and delivery . Patient self care activities - Over the next 90 days, patient will: o Focus on medication adherence by filling and  taking medications appropriately  o Take medications as prescribed o Report any questions or concerns to PharmD and/or provider(s)  Please see past updates related to this goal by clicking on the "Past Updates" button in the selected goal       Takes care of her mom who is 90. Lost husband in 2020 due to sepsis.  She was married 35 years.  She has a daughter and son.  Has 4 grandchildren.  Fills box up every 4 weeks.   Update 08/10/20 Wonders if she has fully processed the grief of her husband  Hypertension   BP goal is:  <140/90  Office blood pressures are  BP Readings from Last 3 Encounters:  10/05/20 122/72  05/04/20 128/76  02/24/20 130/84   Patient checks BP at home infrequently Patient home BP readings are ranging: Unable to assess  Patient has failed these meds in the past: triamterene/hctz (listed in D/C meds. No apparent reason for D/C) Patient is currently controlled on the following medications:  .  Losartan 29m daily   She questions if she is "doubling up" on blood pressure medication considering that she is also taking propranolol. Discussed with patient that while propranolol can affect blood pressure, it would not have a significant impact on her BP and is used more for her tremor.  If concerned she can check her BP more frequently to assure her BP is not going too low.   Update 10/26/20 No specific readings to report. Clinic BP at goal.  Plan -Continue current medications    Diabetes   A1c goal <7%  FBG 80-130  Recent Relevant Labs: Lab Results  Component Value Date/Time   HGBA1C 7.9 (H) 10/05/2020 09:33 AM   HGBA1C 7.9 (H) 02/24/2020 03:52 PM   GFR 35.80 (L) 10/05/2020 09:33 AM   GFR 33.51 (L) 02/24/2020 03:52 PM   MICROALBUR 3.1 (H) 06/03/2019 10:07 AM   MICROALBUR 6.7 (H) 10/25/2016 08:29 AM    Last diabetic Eye exam:  Lab Results  Component Value Date/Time   HMDIABEYEEXA No Retinopathy 08/17/2020 12:00 AM    Last diabetic Foot exam:  No results found for: HMDIABFOOTEX   Checking BG: 2x per Ruth Kovich  Recent FBG Readings: 180-200 yesterday at the lowest BG of 98. Lowest it's been in a long time. Highest about 215.   Patient has failed these meds in past: metformin (renal fx decline) Patient is currently uncontrolled on the following medications:  Ozempic 159mweekly  Problem Story At initial CCM visit pt was using Victoza and DM was above goal. Considering a1c reduction recommended patient move to [place] Completed PAP for Ozempic. Titrated up to Ozempic 32m34mnd BG still above goal.  Decision to add FarIrand apply for PAP as well.   Called [facility] confirmed that all Tier 3 medications including Victoza, Ozempic, and Jardiance (FaWilder Glade non-formulary on her plan) are $45 for 30 DS $90 for 60 DS and 90 DS  Update 09/25/20 Received 32mg732mempic pens from PAP? Has not picked up yet. Encouraged pt to pick up today.  How many 0.5mg 15ms remaining (3 boxes) 203 246 298 197 178 184 204 190 248 193 306 172 174 209 170 200 182 181 175 205 200 184 193  193 160  155 AVG AVG 193.4 205.6  She feels stressed due to losing her husband and being caregiver to her mom. She feels this causes her BG to be elevated. The holiday season is hard for her too. Denies polydipsia, polyphagia, polyuria Discussed her DM control noting that current BG avg might result in a1c higher than previous.  Pt is agreeable with starting another agent. Consider initiation of Farxiga  Update 10/26/20 Thankfully a1c not higher than previous, but remained unchanged.  Farxiga PAP approved? Unsure, pt has not heard anything from [employer] (Attempted to call prior to visit, but wait time was >90 mins, will continue followup)  Update after call Finally reached someone at [employer]. Pt's application was received, but hadn't been processed. Associate stated they would process the application today and call the patient to notify  her of approval. Hopeful patient will be approved and can start Farxi[place].   Plan -Followup on status of Farxiga PAP -Start Farxiga 10mg 26my once PAP application approved through [employer]   Depression   Depression screen PHQ [facility]/20[facility]2021 05/04/2020 08/17/2017  Decreased Interest 0 0 0  Down, Depressed, Hopeless 2 0 0  PHQ - 2 Score 2 0 0  Altered sleeping 0 - -  Tired, decreased  energy 1 - -  Change in appetite 0 - -  Feeling bad or failure about yourself  0 - -  Trouble concentrating 0 - -  Moving slowly or fidgety/restless 1 - -  Suicidal thoughts 0 - -  PHQ-9 Score 4 - -    Patient has failed these meds in past: venlafaxine (inefficacy) Patient is currently controlled on the following medications:  . Fluoxetine 54m #2 daily  Moved up to #2 on Saturday (10/24/20). Tolerating dose Reports she has taken this medication in the past  Plan -Continue current medications     Miscellaneous Meds  Zinc gluconate 528mMultivitamin Vitamin C 28280mitamin K2 100m50mtamin D 1000 units Vitamin B12 1000mc63mUpStream States she does not need any medications.  Created new sync plan at last CCM visit.  New sync date 01/12/21.   KanesDe BlanchrmD, BCACP Clinical Pharmacist [phone]

## 2020-11-12 ENCOUNTER — Telehealth: Payer: Self-pay | Admitting: Pharmacist

## 2020-11-12 NOTE — Progress Notes (Addendum)
° ° °Chronic Care Management °Pharmacy Assistant  ° °Name: Abigail [name]  MRN: [id number] DOB: 10/02/1951 ° °Reason for Encounter: Medication Review ° ° °PCP : Copland, Jessica C, MD ° °Allergies:   °Allergies  °Allergen Reactions  ° Pneumovax 23 [Pneumococcal Vac Polyvalent] Other (See Comments)  °  Pt had severe arm pain  ° Amaryl [Glimepiride] Nausea Only  ° Cephalexin Hives  ° Sulfa Antibiotics Rash  ° ° °Medications: °Outpatient Encounter Medications as of 11/12/2020  °Medication Sig Note  ° Accu-Chek FastClix Lancets MISC USE TO TEST BLOOD SUGAR UP TO FOUR TIMES DAILY AS DIRECTED   ° ACCU-CHEK GUIDE test strip USE TO CHECK BLOOD SUGAR UP TO FOUR TIMES DAILY   ° Ascorbic Acid (VITAMIN C) 1000 MG tablet Take 1,000 mg by mouth daily.   ° atorvastatin (LIPITOR) 20 MG tablet TAKE 1 TABLET(20 MG) BY MOUTH DAILY   ° blood glucose meter kit and supplies Dispense based on patient and insurance preference. Use up to four times daily as directed. (FOR ICD-10 E10.9, E11.9).   ° Blood Glucose Monitoring Suppl (ONE TOUCH ULTRA 2) w/Device KIT Use to check glucose up to 4 times daily. Dx Code E11.9   ° FLUoxetine (PROZAC) 20 MG capsule Take 1 capsule (20 mg total) by mouth daily. Increase to 40 mg after 2 weeks   ° glucose blood (ONETOUCH ULTRA) test strip Use to check glucose up to 4 times daily. Dx Code E11.9   ° losartan (COZAAR) 25 MG tablet TAKE 1 TABLET(25 MG) BY MOUTH DAILY   ° Multiple Vitamins-Minerals (ALIVE WOMENS GUMMY) CHEW Chew 2 tablets by mouth daily.   ° nitroGLYCERIN (NITROSTAT) 0.4 MG SL tablet PLACE 1 TABLET UNDER THE TONGUE EVERY 5 MINUTES AS NEEDED FOR CHEST PAIN 06/12/2020: Has not had to use, but has it just in case  ° omeprazole (PRILOSEC) 20 MG capsule TAKE 1 CAPSULE(20 MG) BY MOUTH DAILY   ° OneTouch Delica Lancets 33G MISC Use to check glucose up to 4 times daily. Dx Code E11.9   ° propranolol (INDERAL) 10 MG tablet TAKE 1 TABLET BY MOUTH EVERY 8-12 HOURS AS NEEDED FOR TREMOR   ° Semaglutide, 1  MG/DOSE, (OZEMPIC, 1 MG/DOSE,) 2 MG/1.5ML SOPN Inject 1 mg into the skin once a week. Getting through PAP   ° traZODone (DESYREL) 150 MG tablet Take 1 tablet (150 mg total) by mouth at bedtime.   ° zinc gluconate 50 MG tablet Take 50 mg by mouth daily.   ° °No facility-administered encounter medications on file as of 11/12/2020.  ° ° °Current Diagnosis: °Patient Active Problem List  ° Diagnosis Date Noted  ° 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/29/2020  ° Essential hypertension   ° Gastroesophageal reflux disease without esophagitis   ° Microscopic hematuria 09/16/2015  ° Trimalleolar fracture of ankle, closed 11/17/2014  ° Other and unspecified hyperlipidemia 07/24/2013  ° Chest pain 10/23/2012  ° Diabetes mellitus (HCC) 10/08/2012  ° Anxiety 10/08/2012  ° ° °Goals Addressed   °None °  ° °Reviewed chart for medication changes ahead of medication coordination call. ° °No OVs, Consults, or hospital visits since last Pharmacist visit.  °  °No medication changes indicated. ° °BP Readings from Last 3 Encounters:  °10/05/20 122/72  °05/04/20 128/76  °02/24/20 130/84  °  °Lab Results  °Component Value Date  ° HGBA1C 7.9 (H) 10/05/2020  °  ° °Patient obtains medications through Adherence Packaging  90 Days  ° °Last adherence delivery included:  °Trazodone 150 mg  °  Atorvastatin 20 mg  Patient declined the following medications last month: Venlafaxine 150 mg (d/c'd) Losartan 25 mg (received a 90 ds on 09-15-2020) Omeprazole 20 mg (received a 90 ds on 09-15-2020) Propranolol 10 mg (has plenty)  Patient is not due for an adherence delivery at this time. Called patient and reviewed medications .    Patient declined the following medications due enough on hand: Losartan 25 mg  Omeprazole 20 mg  Propranolol 10 mg  Trazodone 150 mg  Atorvastatin 20 mg   Patient does not needs any refills.  While reviewing her medications, the patient did share with me that she received a denial letter from [employer]. It stated she did not meet income  requirements to get assistance. Patient would like to have another medication alternative to assit her with blood sugar control. Her BS has been over 200 and she is not comfortable with that. She reports trying to eat right and exercise but is not see results. [name] is concerned about managing her DM. Both her parents were severe diabetics and she is afraid the condition may overcome her. Assured her that I would connect with the clinical pharmacist on other options.  Follow-Up:  Pharmacist Review   Fanny Skates, [place] Pharmacist Assistant [phone]  Note patient's concern and will work to create a plan to assist patient with DM control.   3 minutes spent in review, coordination, and documentation.   Reviewed by: De Blanch, PharmD, BCACP Clinical Pharmacist [facility] [phone]

## 2020-11-16 DIAGNOSIS — E1122 Type 2 diabetes mellitus with diabetic chronic kidney disease: Secondary | ICD-10-CM | POA: Diagnosis not present

## 2020-11-16 DIAGNOSIS — I129 Hypertensive chronic kidney disease with stage 1 through stage 4 chronic kidney disease, or unspecified chronic kidney disease: Secondary | ICD-10-CM | POA: Diagnosis not present

## 2020-11-16 DIAGNOSIS — N1832 Chronic kidney disease, stage 3b: Secondary | ICD-10-CM | POA: Diagnosis not present

## 2020-12-13 ENCOUNTER — Encounter: Payer: Self-pay | Admitting: Family Medicine

## 2020-12-14 ENCOUNTER — Other Ambulatory Visit: Payer: Self-pay

## 2020-12-14 ENCOUNTER — Encounter: Payer: Self-pay | Admitting: Family Medicine

## 2020-12-14 ENCOUNTER — Telehealth (INDEPENDENT_AMBULATORY_CARE_PROVIDER_SITE_OTHER): Payer: PPO | Admitting: Family Medicine

## 2020-12-14 DIAGNOSIS — J029 Acute pharyngitis, unspecified: Secondary | ICD-10-CM

## 2020-12-14 MED ORDER — AZITHROMYCIN 250 MG PO TABS: ORAL_TABLET | ORAL | 0 refills | Status: DC

## 2020-12-14 NOTE — Progress Notes (Signed)
[facility] at [facility] [address], [place], [place] [address] [phone]  Date:  12/14/2020   Name:  Abigail [name]   DOB:  01-12-1951   MRN:  [id number]  PCP:  Darreld Mclean, MD    Chief Complaint: No chief complaint on file.   History of Present Illness:  Abigail [name] is a 70 y.o. very pleasant female patient who presents with the following:  Virtual visit today for possible sinusitis Connected with pt via video monitor  Patient location is home, provider location is office.  Patient identity confirmed with 2 factors, she gives consent for virtual visit today.  The patient myself are present on the visit She is a widow. I also take care of her elderly mother History of hypertension, diabetes, CKD, hyperlipidemia, serous ankle fracture in 2016 requiring surgery Last seen by myself in November for a physical  She developed sx of covid 10 days ago - she took an OTC test and was positive  She has been using OTC medications The right side of her throat was a bit sore She saw some yellow material on her right tonsil and it looks red It does not hurt to swallow any longer No fevers She is coughing mostly at night  She is using mucinex liquid DM max- this does help  No vomiting or diarrhea  No SOB Her right ear is tender She notes soreness of her right sided anterior cervical glands    Patient Active Problem List   Diagnosis Date Noted  . 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/29/2020  . Essential hypertension   . Gastroesophageal reflux disease without esophagitis   . Microscopic hematuria 09/16/2015  . Trimalleolar fracture of ankle, closed 11/17/2014  . Other and unspecified hyperlipidemia 07/24/2013  . Chest pain 10/23/2012  . Diabetes mellitus ([place]) 10/08/2012  . Anxiety 10/08/2012    Past Medical History:  Diagnosis Date  . Anemia, unspecified   . Anxiety   . CKD (chronic kidney disease), stage III ([place])   .  Depression   . Diabetes mellitus    Type 2  . GERD (gastroesophageal reflux disease)   . Hematuria   . Hyperlipidemia   . Hypertension   . Mild mitral regurgitation   . Normal coronary arteries    a. By cardiac CT 08/2017.  . 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    Past Surgical History:  Procedure Laterality Date  . CHOLECYSTECTOMY N/A 06/03/2015   Procedure: LAPAROSCOPIC CHOLECYSTECTOMY WITH INTRAOPERATIVE CHOLANGIOGRAM;  Surgeon: Donnie Mesa, MD;  Location: [facility];  Service: General;  Laterality: N/A;  . COLONOSCOPY    . FRACTURE SURGERY    . HAMMER TOE SURGERY    . ORIF ANKLE FRACTURE Left 11/17/2014   Procedure: OPEN REDUCTION INTERNAL FIXATION (ORIF) LEFT TRIMALLEOLAR ANKLE FRACTURE;  Surgeon: Marianna Payment, MD;  Location: [facility];  Service: Orthopedics;  Laterality: Left;  . OVARIAN CYST REMOVAL      Social History   Tobacco Use  . Smoking status: Never Smoker  . Smokeless tobacco: Never Used  Vaping Use  . Vaping Use: Never used  Substance Use Topics  . Alcohol use: Yes    Alcohol/week: 1.0 standard drink    Types: 1 Glasses of wine per week  . Drug use: No    Family History  Problem Relation Age of Onset  . Diabetes Mother   . Hyperlipidemia Mother   . Diabetes Father   .  Heart disease Father 69       AMI x 2; first AMI age 68  . Stroke Father   . Heart disease Brother        defibrillator; CHF  . Hyperlipidemia Brother     Allergies  Allergen Reactions  . Pneumovax 23 [Pneumococcal Vac Polyvalent] Other (See Comments)    Pt had severe arm pain  . Amaryl [Glimepiride] Nausea Only  . Cephalexin Hives  . Sulfa Antibiotics Rash    Medication list has been reviewed and updated.  Current Outpatient Medications on File Prior to Visit  Medication Sig Dispense Refill  . Accu-Chek FastClix Lancets MISC USE TO TEST BLOOD SUGAR UP TO FOUR TIMES DAILY AS DIRECTED 306 each 2  . ACCU-CHEK GUIDE test strip USE TO CHECK BLOOD SUGAR UP TO  FOUR TIMES DAILY 300 strip 2  . Ascorbic Acid (VITAMIN C) 1000 MG tablet Take 1,000 mg by mouth daily.    [name] atorvastatin (LIPITOR) 20 MG tablet TAKE 1 TABLET(20 MG) BY MOUTH DAILY 90 tablet 3  . blood glucose meter kit and supplies Dispense based on patient and insurance preference. Use up to four times daily as directed. (FOR ICD-10 E10.9, E11.9). 1 each 0  . Blood Glucose Monitoring Suppl (ONE TOUCH ULTRA 2) w/Device KIT Use to check glucose up to 4 times daily. Dx Code E11.9 1 kit 0  . FLUoxetine (PROZAC) 20 MG capsule Take 1 capsule (20 mg total) by mouth daily. Increase to 40 mg after 2 weeks 180 capsule 3  . glucose blood (ONETOUCH ULTRA) test strip Use to check glucose up to 4 times daily. Dx Code E11.9 400 each 1  . losartan (COZAAR) 25 MG tablet TAKE 1 TABLET(25 MG) BY MOUTH DAILY 90 tablet 3  . Multiple Vitamins-Minerals (ALIVE WOMENS GUMMY) CHEW Chew 2 tablets by mouth daily.    . nitroGLYCERIN (NITROSTAT) 0.4 MG SL tablet PLACE 1 TABLET UNDER THE TONGUE EVERY 5 MINUTES AS NEEDED FOR CHEST PAIN 25 tablet 2  . omeprazole (PRILOSEC) 20 MG capsule TAKE 1 CAPSULE(20 MG) BY MOUTH DAILY 90 capsule 3  . OneTouch Delica Lancets 56Y MISC Use to check glucose up to 4 times daily. Dx Code E11.9 400 each 1  . propranolol (INDERAL) 10 MG tablet TAKE 1 TABLET BY MOUTH EVERY 8-12 HOURS AS NEEDED FOR TREMOR 270 tablet 0  . Semaglutide, 1 MG/DOSE, (OZEMPIC, 1 MG/DOSE,) 2 MG/1.5ML SOPN Inject 1 mg into the skin once a week. Getting through PAP    . traZODone (DESYREL) 150 MG tablet Take 1 tablet (150 mg total) by mouth at bedtime. 90 tablet 3  . zinc gluconate 50 MG tablet Take 50 mg by mouth daily.     No current facility-administered medications on file prior to visit.    Review of Systems:  As per HPI- otherwise negative.   Physical Examination: There were no vitals filed for this visit. There were no vitals filed for this visit. There is no height or weight on file to calculate BMI. Ideal  Body Weight:     Pt observed via mychart video- she looks well and her normal self Not checking vital signs at home  Assessment and Plan: Pharyngitis, unspecified etiology - Plan: azithromycin (ZITHROMAX) 250 MG tablet  Virtual visit today to discuss pharyngitis and tender cervical nodes after recent COVID-19 infection.  At this point her main issue is sore throat, possible suspicion of strep.  She is allergic to penicillin.  I called in azithromycin for  to use, she will let me know if not feeling better in the next few days- Sooner if worse.  Video used for duration of visit today  Signed Lamar Blinks, MD

## 2020-12-14 NOTE — Telephone Encounter (Signed)
Scheduled patient for visit this afternoon. Patient had appointment at 7 today.

## 2020-12-18 ENCOUNTER — Other Ambulatory Visit: Payer: Self-pay

## 2020-12-18 ENCOUNTER — Emergency Department (HOSPITAL_BASED_OUTPATIENT_CLINIC_OR_DEPARTMENT_OTHER)
Admission: EM | Admit: 2020-12-18 | Discharge: 2020-12-18 | Disposition: A | Discharge status: Home / Self Care | Payer: PPO | Attending: Emergency Medicine | Admitting: Emergency Medicine

## 2020-12-18 ENCOUNTER — Emergency Department (HOSPITAL_BASED_OUTPATIENT_CLINIC_OR_DEPARTMENT_OTHER): Payer: PPO

## 2020-12-18 ENCOUNTER — Encounter (HOSPITAL_BASED_OUTPATIENT_CLINIC_OR_DEPARTMENT_OTHER): Payer: Self-pay

## 2020-12-18 DIAGNOSIS — J984 Other disorders of lung: Secondary | ICD-10-CM | POA: Diagnosis not present

## 2020-12-18 DIAGNOSIS — R0789 Other chest pain: Secondary | ICD-10-CM | POA: Diagnosis not present

## 2020-12-18 DIAGNOSIS — Z8616 Personal history of COVID-19: Secondary | ICD-10-CM | POA: Insufficient documentation

## 2020-12-18 DIAGNOSIS — K449 Diaphragmatic hernia without obstruction or gangrene: Secondary | ICD-10-CM | POA: Diagnosis not present

## 2020-12-18 DIAGNOSIS — E1122 Type 2 diabetes mellitus with diabetic chronic kidney disease: Secondary | ICD-10-CM | POA: Insufficient documentation

## 2020-12-18 DIAGNOSIS — Z794 Long term (current) use of insulin: Secondary | ICD-10-CM | POA: Diagnosis not present

## 2020-12-18 DIAGNOSIS — Z85828 Personal history of other malignant neoplasm of skin: Secondary | ICD-10-CM | POA: Diagnosis not present

## 2020-12-18 DIAGNOSIS — I2510 Atherosclerotic heart disease of native coronary artery without angina pectoris: Secondary | ICD-10-CM | POA: Diagnosis not present

## 2020-12-18 DIAGNOSIS — Z9049 Acquired absence of other specified parts of digestive tract: Secondary | ICD-10-CM | POA: Diagnosis not present

## 2020-12-18 DIAGNOSIS — N183 Chronic kidney disease, stage 3 unspecified: Secondary | ICD-10-CM | POA: Diagnosis not present

## 2020-12-18 DIAGNOSIS — I129 Hypertensive chronic kidney disease with stage 1 through stage 4 chronic kidney disease, or unspecified chronic kidney disease: Secondary | ICD-10-CM | POA: Diagnosis not present

## 2020-12-18 DIAGNOSIS — I700 Atherosclerosis of aorta: Secondary | ICD-10-CM | POA: Diagnosis not present

## 2020-12-18 DIAGNOSIS — Z79899 Other long term (current) drug therapy: Secondary | ICD-10-CM | POA: Insufficient documentation

## 2020-12-18 DIAGNOSIS — K429 Umbilical hernia without obstruction or gangrene: Secondary | ICD-10-CM | POA: Diagnosis not present

## 2020-12-18 DIAGNOSIS — J9811 Atelectasis: Secondary | ICD-10-CM | POA: Diagnosis not present

## 2020-12-18 LAB — COMPREHENSIVE METABOLIC PANEL
ALT: 17 U/L (ref 0–44)
AST: 20 U/L (ref 15–41)
Albumin: 3.6 g/dL (ref 3.5–5.0)
Alkaline Phosphatase: 70 U/L (ref 38–126)
Anion gap: 10 (ref 5–15)
BUN: 15 mg/dL (ref 8–23)
CO2: 26 mmol/L (ref 22–32)
Calcium: 8.5 mg/dL — ABNORMAL LOW (ref 8.9–10.3)
Chloride: 102 mmol/L (ref 98–111)
Creatinine, Ser: 1.39 mg/dL — ABNORMAL HIGH (ref 0.44–1.00)
GFR, Estimated: 41 mL/min — ABNORMAL LOW (ref >60)
Glucose, Bld: 215 mg/dL — ABNORMAL HIGH (ref 70–99)
Potassium: 3.4 mmol/L — ABNORMAL LOW (ref 3.5–5.1)
Sodium: 138 mmol/L (ref 135–145)
Total Bilirubin: 0.2 mg/dL — ABNORMAL LOW (ref 0.3–1.2)
Total Protein: 6.6 g/dL (ref 6.5–8.1)

## 2020-12-18 LAB — CBC WITH DIFFERENTIAL/PLATELET
Abs Immature Granulocytes: 0.03 10*3/uL (ref 0.00–0.07)
Basophils Absolute: 0 10*3/uL (ref 0.0–0.1)
Basophils Relative: 0 %
Eosinophils Absolute: 0.1 10*3/uL (ref 0.0–0.5)
Eosinophils Relative: 1 %
HCT: 37.4 % (ref 36.0–46.0)
Hemoglobin: 12.1 g/dL (ref 12.0–15.0)
Immature Granulocytes: 1 %
Lymphocytes Relative: 24 %
Lymphs Abs: 1.4 10*3/uL (ref 0.7–4.0)
MCH: 27.6 pg (ref 26.0–34.0)
MCHC: 32.4 g/dL (ref 30.0–36.0)
MCV: 85.4 fL (ref 80.0–100.0)
Monocytes Absolute: 0.3 10*3/uL (ref 0.1–1.0)
Monocytes Relative: 6 %
Neutro Abs: 4.2 10*3/uL (ref 1.7–7.7)
Neutrophils Relative %: 68 %
Platelets: 175 10*3/uL (ref 150–400)
RBC: 4.38 MIL/uL (ref 3.87–5.11)
RDW: 12.6 % (ref 11.5–15.5)
WBC: 6.1 10*3/uL (ref 4.0–10.5)
nRBC: 0 % (ref 0.0–0.2)

## 2020-12-18 LAB — TROPONIN I (HIGH SENSITIVITY)
Troponin I (High Sensitivity): 4 ng/L (ref <18)
Troponin I (High Sensitivity): 4 ng/L (ref <18)

## 2020-12-18 MED ORDER — ACETAMINOPHEN 325 MG PO TABS
650.0000 mg | ORAL_TABLET | Freq: Once | ORAL | Status: AC
  Administered 2020-12-18: 650 mg via ORAL
  Filled 2020-12-18: qty 2

## 2020-12-18 MED ORDER — IOHEXOL 350 MG/ML SOLN
100.0000 mL | Freq: Once | INTRAVENOUS | Status: AC | PRN
  Administered 2020-12-18: 80 mL via INTRAVENOUS

## 2020-12-18 NOTE — ED Notes (Signed)
Pt ambulated to restroom without difficulty

## 2020-12-18 NOTE — ED Triage Notes (Signed)
Pt arrives ambulatory to ED with c/o heaviness in her chest since Wednesday. Pt states she had Covid about 3 weeks ago and has been on antibiotics for infection in her neck.

## 2020-12-18 NOTE — ED Notes (Signed)
Ambulated to bathroom with steady gait

## 2020-12-18 NOTE — Discharge Instructions (Addendum)
You were evaluated in emergency room today for your chest pain.  Your physical exam, vital signs, blood work, and CT scan were very reassuring.  Your CT scan did not show any changes in your blood vessels, and there is no pneumonia in your lungs at this time.  There is some inflammation that may be consistent with bronchitis, following your infection with pneumonia.  There were some new changes on your EKG, for which you should follow-up with cardiology.  Below is contact information for the cardiology group associated with [facility].  Please call and schedule appointment within the next few days for an emergency department follow-up.  Additionally may follow-up with them regarding your chest pain.  Return to the emergency department develop worsening chest pain, difficulty breathing, nausea or vomiting that does not stop, or any other new severe symptoms.

## 2020-12-18 NOTE — ED Notes (Signed)
ED Provider at bedside. 

## 2020-12-18 NOTE — ED Provider Notes (Signed)
[facility] EMERGENCY DEPARTMENT Provider Note   CSN: [id number] Arrival date & time: 12/18/20  1001     History Chief Complaint  Patient presents with  . Chest Pain    Abigail [name] is a 70 y.o. female who presents with concern for 3 days of central chest heaviness.  She states she woke up at 3:30 AM on Wednesday morning with central chest pressure and pain that radiates directly to her back and occasionally up into her left neck and shoulder.  She states that it was the worst when she woke up at 330 that day, however has been present though waxing and waning in severity since that time.  She states today she called her primary care doctor's office to be evaluated, however they had no openings and directed her to the emergency department due to her chest pain.  She denies any shortness of breath or palpitations.  She denies any history of similar chest pain in the past.  She denies any abdominal pain, nausea, vomiting, diarrhea, fevers, chills at home.  She was diagnosed with COVID-19 approximately 3 weeks ago, tested positive with over-the-counter home test after exposure to her daughter who tested positive.  She is fully vaccinated against COVID-19 with a booster, and has recovered from Covid with the exception of some persistent fatigue.  I personally reviewed this patient's medical records.  She history of diabetes, hypertension, GERD, CKD, and anxiety.  In reviewing her records, she has history of chest pain and a prescription for nitroglycerin, she states she has never had to use that was prescribed "just in case".  She endorses compliance with her antihypertensive and antihyperlipidemic medications as well as her diabetes medications.  She is currently taking azithromycin for presumed bacterial throat infection as diagnosed by telemedicine visit her primary care doctor earlier this week.  HPI  HPI: A 70 year old patient with a history of treated diabetes, hypertension and  hypercholesterolemia presents for evaluation of chest pain. Initial onset of pain was more than 6 hours ago. The patient's chest pain is described as heaviness/pressure/tightness and is not worse with exertion. The patient's chest pain is middle- or left-sided, is not well-localized, is not sharp and does radiate to the arms/jaw/neck. The patient does not complain of nausea and denies diaphoresis. The patient has a family history of coronary artery disease in a first-degree relative with onset less than age 101. The patient has no history of stroke, has no history of peripheral artery disease, has not smoked in the past 90 days and does not have an elevated BMI (>=30).   Past Medical History:  Diagnosis Date  . Anemia, unspecified   . Anxiety   . CKD (chronic kidney disease), stage III ([place])   . Depression   . Diabetes mellitus    Type 2  . GERD (gastroesophageal reflux disease)   . Hematuria   . Hyperlipidemia   . Hypertension   . Mild mitral regurgitation   . Normal coronary arteries    a. By cardiac CT 08/2017.  . 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    Patient Active Problem List   Diagnosis Date Noted  . 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/29/2020  . Essential hypertension   . Gastroesophageal reflux disease without esophagitis   . Microscopic hematuria 09/16/2015  . Trimalleolar fracture of ankle, closed 11/17/2014  . Other and unspecified hyperlipidemia 07/24/2013  . Chest pain 10/23/2012  . Diabetes mellitus ([place]) 10/08/2012  . Anxiety 10/08/2012    Past Surgical  History:  Procedure Laterality Date  . CHOLECYSTECTOMY N/A 06/03/2015   Procedure: LAPAROSCOPIC CHOLECYSTECTOMY WITH INTRAOPERATIVE CHOLANGIOGRAM;  Surgeon: Donnie Mesa, MD;  Location: [facility];  Service: General;  Laterality: N/A;  . COLONOSCOPY    . FRACTURE SURGERY    . HAMMER TOE SURGERY    . ORIF ANKLE FRACTURE Left 11/17/2014   Procedure: OPEN REDUCTION INTERNAL FIXATION (ORIF) LEFT TRIMALLEOLAR ANKLE  FRACTURE;  Surgeon: Marianna Payment, MD;  Location: [facility];  Service: Orthopedics;  Laterality: Left;  . OVARIAN CYST REMOVAL       OB History   No obstetric history on file.     Family History  Problem Relation Age of Onset  . Diabetes Mother   . Hyperlipidemia Mother   . Diabetes Father   . Heart disease Father 70       AMI x 2; first AMI age 11  . Stroke Father   . Heart disease Brother        defibrillator; CHF  . Hyperlipidemia Brother     Social History   Tobacco Use  . Smoking status: Never Smoker  . Smokeless tobacco: Never Used  Vaping Use  . Vaping Use: Never used  Substance Use Topics  . Alcohol use: Yes    Alcohol/week: 1.0 standard drink    Types: 1 Glasses of wine per week  . Drug use: No    Home Medications Prior to Admission medications   Medication Sig Start Date End Date Taking? Authorizing Provider  Accu-Chek FastClix Lancets MISC USE TO TEST BLOOD SUGAR UP TO FOUR TIMES DAILY AS DIRECTED 09/12/19   Copland, Gay Filler, MD  ACCU-CHEK GUIDE test strip USE TO CHECK BLOOD SUGAR UP TO FOUR TIMES DAILY 07/14/20   Copland, Gay Filler, MD  Ascorbic Acid (VITAMIN C) 1000 MG tablet Take 1,000 mg by mouth daily.    [provider]  atorvastatin (LIPITOR) 20 MG tablet TAKE 1 TABLET(20 MG) BY MOUTH DAILY 07/07/20   Copland, Gay Filler, MD  azithromycin (ZITHROMAX) 250 MG tablet Use as a zpack 12/14/20   Copland, Gay Filler, MD  blood glucose meter kit and supplies Dispense based on patient and insurance preference. Use up to four times daily as directed. (FOR ICD-10 E10.9, E11.9). 07/02/19   Copland, Gay Filler, MD  Blood Glucose Monitoring Suppl (ONE TOUCH ULTRA 2) w/Device KIT Use to check glucose up to 4 times daily. Dx Code E11.9 07/16/20   Copland, Gay Filler, MD  FARXIGA 10 MG TABS tablet Take 10 mg by mouth daily. 11/17/20   [provider]  FLUoxetine (PROZAC) 20 MG capsule Take 1 capsule (20 mg total) by mouth daily. Increase to 40 mg after 2 weeks  10/05/20   Copland, Gay Filler, MD  glucose blood (ONETOUCH ULTRA) test strip Use to check glucose up to 4 times daily. Dx Code E11.9 07/16/20   Copland, Gay Filler, MD  losartan (COZAAR) 25 MG tablet TAKE 1 TABLET(25 MG) BY MOUTH DAILY 07/07/20   Copland, Gay Filler, MD  Multiple Vitamins-Minerals (ALIVE WOMENS GUMMY) CHEW Chew 2 tablets by mouth daily.    [provider]  nitroGLYCERIN (NITROSTAT) 0.4 MG SL tablet PLACE 1 TABLET UNDER THE TONGUE EVERY 5 MINUTES AS NEEDED FOR CHEST PAIN 05/05/20   Copland, Gay Filler, MD  omeprazole (PRILOSEC) 20 MG capsule TAKE 1 CAPSULE(20 MG) BY MOUTH DAILY 07/07/20   Copland, Gay Filler, MD  OneTouch Delica Lancets 94W MISC Use to check glucose up to 4 times daily.  Dx Code E11.9 07/16/20   Copland, Gay Filler, MD  propranolol (INDERAL) 10 MG tablet TAKE 1 TABLET BY MOUTH EVERY 8-12 HOURS AS NEEDED FOR TREMOR 08/26/20   Copland, Gay Filler, MD  Semaglutide, 1 MG/DOSE, (OZEMPIC, 1 MG/DOSE,) 2 MG/1.5ML SOPN Inject 1 mg into the skin once a week. Getting through PAP    [provider]  traZODone (DESYREL) 150 MG tablet Take 1 tablet (150 mg total) by mouth at bedtime. 07/07/20   Copland, Gay Filler, MD  zinc gluconate 50 MG tablet Take 50 mg by mouth daily.    [provider]    Allergies    Pneumovax 23 [pneumococcal vac polyvalent], Amaryl [glimepiride], Cephalexin, and Sulfa antibiotics  Review of Systems   Review of Systems  Constitutional: Positive for fatigue. Negative for activity change, appetite change, chills and fever.  HENT: Positive for congestion and sore throat. Negative for trouble swallowing.   Eyes: Negative.   Respiratory: Negative for cough, chest tightness and shortness of breath.   Cardiovascular: Positive for chest pain. Negative for palpitations and leg swelling.  Gastrointestinal: Negative.  Negative for abdominal pain, diarrhea, nausea and vomiting.  Genitourinary: Negative.   Musculoskeletal: Negative.   Skin:  Negative.   Neurological: Negative.   Hematological: Negative.   Psychiatric/Behavioral: Negative.     Physical Exam Updated Vital Signs BP (!) 145/78 (BP Location: Right Arm)   Pulse 78   Temp 98.9 F (37.2 C) (Oral)   Resp 18   Ht 5' 5" (1.651 m)   Wt 66.7 kg   SpO2 98%   BMI 24.46 kg/m   Physical Exam Vitals and nursing note reviewed.  Constitutional:      Appearance: Normal appearance. She is normal weight.  HENT:     Head: Normocephalic and atraumatic.     Nose: Nose normal.     Mouth/Throat:     Mouth: Mucous membranes are moist.     Pharynx: Oropharynx is clear. Uvula midline. No oropharyngeal exudate, posterior oropharyngeal erythema or uvula swelling.     Tonsils: No tonsillar exudate.  Eyes:     General: Lids are normal. Vision grossly intact.        Right eye: No discharge.        Left eye: No discharge.     Conjunctiva/sclera: Conjunctivae normal.     Pupils: Pupils are equal, round, and reactive to light.  Neck:     Trachea: Trachea and phonation normal.  Cardiovascular:     Rate and Rhythm: Normal rate and regular rhythm.     Pulses: Normal pulses.          Radial pulses are 2+ on the right side and 2+ on the left side.       Dorsalis pedis pulses are 2+ on the right side and 2+ on the left side.     Heart sounds: Normal heart sounds. No murmur heard.   Pulmonary:     Effort: Pulmonary effort is normal. No respiratory distress.     Breath sounds: Normal breath sounds. No wheezing or rales.  Chest:     Chest wall: No swelling, tenderness, crepitus or edema.  Abdominal:     General: Bowel sounds are normal. There is no distension.     Palpations: Abdomen is soft.     Tenderness: There is no abdominal tenderness. There is no right CVA tenderness or left CVA tenderness.  Musculoskeletal:        General: No deformity.  Cervical back: Normal range of motion and neck supple. No crepitus. No pain with movement or spinous process tenderness.     Right  lower leg: No tenderness. No edema.     Left lower leg: No tenderness. No edema.  Lymphadenopathy:     Cervical: No cervical adenopathy.  Skin:    General: Skin is warm and dry.     Capillary Refill: Capillary refill takes less than 2 seconds.  Neurological:     General: No focal deficit present.     Mental Status: She is alert and oriented to person, place, and time. Mental status is at baseline.     Cranial Nerves: Cranial nerves are intact.     Sensory: Sensation is intact.     Motor: Motor function is intact.     Gait: Gait is intact.  Psychiatric:        Mood and Affect: Mood normal.     ED Results / Procedures / Treatments   Labs (all labs ordered are listed, but only abnormal results are displayed) Labs Reviewed  COMPREHENSIVE METABOLIC PANEL - Abnormal; Notable for the following components:      Result Value   Potassium 3.4 (*)    Glucose, Bld 215 (*)    Creatinine, Ser 1.39 (*)    Calcium 8.5 (*)    Total Bilirubin 0.2 (*)    GFR, Estimated 41 (*)    All other components within normal limits  CBC WITH DIFFERENTIAL/PLATELET  TROPONIN I (HIGH SENSITIVITY)  TROPONIN I (HIGH SENSITIVITY)    EKG EKG Interpretation  Date/Time:  Friday December 18 2020 10:14:49 EST Ventricular Rate:  81 PR Interval:    QRS Duration: 127 QT Interval:  422 QTC Calculation: 490 R Axis:   0 Text Interpretation: Sinus rhythm Left bundle branch block Confirmed by Madalyn Rob [phone]) on 12/18/2020 11:47:43 AM   Radiology DG Chest 2 View  Result Date: 12/18/2020 CLINICAL DATA:  Chest heaviness. EXAM: CHEST - 2 VIEW COMPARISON:  August 17, 2017. FINDINGS: The heart size and mediastinal contours are within normal limits. Both lungs are clear. No pneumothorax or pleural effusion is noted. The visualized skeletal structures are unremarkable. IMPRESSION: No active cardiopulmonary disease. Electronically Signed   By: Marijo Conception M.D.   On: 12/18/2020 11:15   CT Angio Chest/Abd/Pel  for Dissection W and/or W/WO  Result Date: 12/18/2020 CLINICAL DATA:  Chest pressure and shortness of breath. Recent COVID-19 infection. EXAM: CT ANGIOGRAPHY CHEST, ABDOMEN AND PELVIS TECHNIQUE: Non-contrast CT of the chest was initially obtained. Multidetector CT imaging through the chest, abdomen and pelvis was performed using the standard protocol during bolus administration of intravenous contrast. Multiplanar reconstructed images and MIPs were obtained and reviewed to evaluate the vascular anatomy. CONTRAST:  44mL OMNIPAQUE IOHEXOL 350 MG/ML SOLN COMPARISON:  Chest radiographs 12/18/2020. CT abdomen and pelvis 10/29/2019. FINDINGS: CTA CHEST FINDINGS Cardiovascular: No thoracic aortic intramural hematoma is evident on precontrast images. There is no thoracic aortic dissection or aneurysm. No central pulmonary arterial emboli are evident. The heart is normal in size. There is no pericardial effusion. Mediastinum/Nodes: No enlarged axillary, mediastinal, or hilar lymph nodes. Unremarkable thyroid. Small sliding hiatal hernia. Lungs/Pleura: No pleural effusion or pneumothorax. Mild tree in bud densities in the posterolateral right upper lobe. Separate 4 mm nodule more superiorly in the right upper lobe (series 6, image 10). Minimal atelectasis in the lung bases. Musculoskeletal: No acute osseous abnormality or suspicious osseous lesion. Review of the MIP images confirms the above  findings. CTA ABDOMEN AND PELVIS FINDINGS VASCULAR Aorta: Minimal atherosclerosis without evidence of dissection, aneurysm, or significant stenosis. Celiac: Patent without evidence of aneurysm, dissection, vasculitis or significant stenosis. Normal variant anatomy with the common hepatic artery arising from the aorta. SMA: Patent without evidence of aneurysm, dissection, vasculitis or significant stenosis. Renals: Both renal arteries are patent without evidence of aneurysm, dissection, vasculitis, fibromuscular dysplasia or significant  stenosis. IMA: Patent without evidence of aneurysm, dissection, vasculitis or significant stenosis. Inflow: Patent without evidence of aneurysm, dissection, vasculitis or significant stenosis. Veins: No obvious venous abnormality within the limitations of this arterial phase study. Review of the MIP images confirms the above findings. NON-VASCULAR Hepatobiliary: No focal liver abnormality is seen. Status post cholecystectomy. No biliary dilatation. Pancreas: Unremarkable. Spleen: Unremarkable. Adrenals/Urinary Tract: Unremarkable adrenal glands. Bilateral renal cortical thinning/scarring. No renal mass, calculi, or hydronephrosis. Unremarkable bladder. Stomach/Bowel: There is no evidence of bowel obstruction or inflammation. The appendix is unremarkable. Lymphatic: No enlarged lymph nodes. Reproductive: Unremarkable uterus and ovaries. Other: Trace pelvic free fluid. Small fat containing umbilical hernia. Musculoskeletal: No acute osseous abnormality or suspicious osseous lesion. Review of the MIP images confirms the above findings. IMPRESSION: 1. No evidence of aortic dissection or other acute aortic pathology. 2. Mild tree-in-bud pulmonary densities in the right upper lobe suggesting mild bronchiolitis. 3. Separate 4 mm nodule more superiorly in the right upper lobe. No follow-up needed if patient is low-risk. Non-contrast chest CT can be considered in 12 months if patient is high-risk. This recommendation follows the consensus statement: Guidelines for Management of Incidental Pulmonary Nodules Detected on CT Images: From the Fleischner Society 2017; Radiology 2017; 284:228-243. 4. Small sliding hiatal hernia. 5. Trace pelvic free fluid. 6. Aortic Atherosclerosis (ICD10-I70.0). Electronically Signed   By: Logan Bores M.D.   On: 12/18/2020 13:44    Procedures Procedures   Medications Ordered in ED Medications  acetaminophen (TYLENOL) tablet 650 mg (650 mg Oral Given 12/18/20 1149)  iohexol (OMNIPAQUE) 350  MG/ML injection 100 mL (80 mLs Intravenous Contrast Given 12/18/20 1310)    ED Course  I have reviewed the triage vital signs and the nursing notes.  Pertinent labs & imaging results that were available during my care of the patient were reviewed by me and considered in my medical decision making (see chart for details).    MDM Rules/Calculators/A&P HEAR Score: 11                       70 year old female presents with concern for 3 days of central chest heaviness that radiates to her back.  Patient is 3 weeks post COVID-19 infection.  She has been vaccinated against COVID-19.  Differential diagnosis for the patient's symptoms include but are not limited to ACS, PE, aortic dissection, aortic aneurysm, cholecystitis, pancreatitis, pneumothorax, GERD, pneumonia, bronchitis.  Patient hypertensive on intake to 155/78.  Vital signs otherwise normal.  Cardiopulmonary exam is normal, abdominal exam is benign.  Patient is neurovascularly intact in all 4 extremities.  There is no lower extremity edema.  Basic laboratory studies obtained in triage.  CBC unremarkable.  CMP with mild hypokalemia to 3.4.  Elevated creatinine to 1.39, however patient's baseline  with known history of CKD.  Initial troponin negative, 4.  Given elevated risk per HEART score of 6, will proceed with delta troponin at this time.  Additionally given radiation of chest pain to the back we will proceed with blood pressure evaluation in all 4 extremities.  Significant decrease in  blood pressure in the right upper extremity as compared to the left upper extremity and the lower extremities bilaterally.  Attending physician, Dr. Roslynn Amble evaluated this patient at the bedside.  Given discrepancy in blood pressures from left to right, will proceed with CT angiogram to evaluate for dissection at this time.  Delta troponin negative, 4.  EKG with new left bundle branch block.  CT angiogram negative for aortic dissection or acute aortic pathology.   Mild bronchiolitis.  4 mm nodule in the right upper lobe, no need for follow-up the patient is low risk, which she is.  Patient reevaluated chest pain now rated at 4/10.  Given reassuring physical exam, laboratory studies, and imaging studies, no further work-up is warranted in the ED at this time.  Suspect that patient's chest pain may be related to persistent pulmonary inflammation secondary to Covid.  Given new left bundle branch block, recommend she follow-up closely with cardiology.  Anzlee voiced understanding of her medical evaluation and treatment plan.  Each of her questions was answered to her expressed satisfaction.  Return precautions given.  Patient is well-appearing, stable, and appropriate for discharge at this time.  This chart was dictated using voice recognition software, Dragon. Despite the best efforts of this provider to proofread and correct errors, errors may still occur which can change documentation meaning.  Abigail [name] was evaluated in Emergency Department on 12/18/2020 for the symptoms described in the history of present illness. She was evaluated in the context of the global COVID-19 pandemic, which necessitated consideration that the patient might be at risk for infection with the SARS-CoV-2 virus that causes COVID-19. Institutional protocols and algorithms that pertain to the evaluation of patients at risk for COVID-19 are in a state of rapid change based on information released by regulatory bodies including the CDC and federal and state organizations. These policies and algorithms were followed during the patient's care in the ED.  Final Clinical Impression(s) / ED Diagnoses Final diagnoses:  Atypical chest pain    Rx / DC Orders ED Discharge Orders    None       Aura Dials 12/18/20 2047    Lucrezia Starch, MD 12/19/20 1742

## 2020-12-21 ENCOUNTER — Telehealth: Payer: Self-pay

## 2020-12-21 ENCOUNTER — Telehealth: Payer: Self-pay | Admitting: Pharmacist

## 2020-12-21 DIAGNOSIS — Z636 Dependent relative needing care at home: Secondary | ICD-10-CM

## 2020-12-21 DIAGNOSIS — F4323 Adjustment disorder with mixed anxiety and depressed mood: Secondary | ICD-10-CM

## 2020-12-21 DIAGNOSIS — N1832 Chronic kidney disease, stage 3b: Secondary | ICD-10-CM | POA: Diagnosis not present

## 2020-12-21 LAB — BASIC METABOLIC PANEL
BUN: 18 (ref 4–21)
CO2: 28 — AB (ref 13–22)
Chloride: 101 (ref 99–108)
Creatinine: 1.3 — AB (ref 0.5–1.1)
Glucose: 124
Potassium: 4.4 (ref 3.4–5.3)
Sodium: 139 (ref 137–147)

## 2020-12-21 LAB — CBC AND DIFFERENTIAL: Hemoglobin: 12.1 (ref 12.0–16.0)

## 2020-12-21 LAB — COMPREHENSIVE METABOLIC PANEL
Albumin: 4.3 (ref 3.5–5.0)
Calcium: 9 (ref 8.7–10.7)
GFR calc Af Amer: 42

## 2020-12-21 LAB — HEMOGLOBIN A1C: Hemoglobin A1C: 7.4

## 2020-12-21 MED ORDER — FLUOXETINE HCL 20 MG PO CAPS: 20.0000 mg | ORAL_CAPSULE | Freq: Every day | ORAL | 3 refills | Status: DC

## 2020-12-21 NOTE — Progress Notes (Signed)
Chronic Care Management Pharmacy Assistant   Name: Abigail [name]  MRN: [id number] DOB: 1951-03-23  Reason for Encounter: Medication Review  Patient Questions:  1.  Have you seen any other providers since your last visit? Yes.   2.  Any changes in your medicines or health? Yes.   PCP : Darreld Mclean, MD   Office Visits: 12/14/20 (Video Visits) Copland, Gay Filler, MD. STARTED Azithromycin 250 mg use as a zpack.   Consults: None since 11/12/20  Hospital: 12/28/20 For chest pain. EKGs image, Blood work, CT image taken. No medication changes.Discharge Orders: None.  Allergies:   Allergies  Allergen Reactions  . Pneumovax 23 [Pneumococcal Vac Polyvalent] Other (See Comments)    Pt had severe arm pain  . Amaryl [Glimepiride] Nausea Only  . Cephalexin Hives  . Sulfa Antibiotics Rash    Medications: Outpatient Encounter Medications as of 12/21/2020  Medication Sig Note  . Accu-Chek FastClix Lancets MISC USE TO TEST BLOOD SUGAR UP TO FOUR TIMES DAILY AS DIRECTED   . ACCU-CHEK GUIDE test strip USE TO CHECK BLOOD SUGAR UP TO FOUR TIMES DAILY   . Ascorbic Acid (VITAMIN C) 1000 MG tablet Take 1,000 mg by mouth daily.   [name] atorvastatin (LIPITOR) 20 MG tablet TAKE 1 TABLET(20 MG) BY MOUTH DAILY   . azithromycin (ZITHROMAX) 250 MG tablet Use as a zpack   . blood glucose meter kit and supplies Dispense based on patient and insurance preference. Use up to four times daily as directed. (FOR ICD-10 E10.9, E11.9).   [name] Blood Glucose Monitoring Suppl (ONE TOUCH ULTRA 2) w/Device KIT Use to check glucose up to 4 times daily. Dx Code E11.9   . FARXIGA 10 MG TABS tablet Take 10 mg by mouth daily.   [name] FLUoxetine (PROZAC) 20 MG capsule Take 1 capsule (20 mg total) by mouth daily. Increase to 40 mg after 2 weeks   . glucose blood (ONETOUCH ULTRA) test strip Use to check glucose up to 4 times daily. Dx Code E11.9   . losartan (COZAAR) 25 MG tablet TAKE 1 TABLET(25 MG) BY MOUTH DAILY   .  Multiple Vitamins-Minerals (ALIVE WOMENS GUMMY) CHEW Chew 2 tablets by mouth daily.   . nitroGLYCERIN (NITROSTAT) 0.4 MG SL tablet PLACE 1 TABLET UNDER THE TONGUE EVERY 5 MINUTES AS NEEDED FOR CHEST PAIN 06/12/2020: Has not had to use, but has it just in case  . omeprazole (PRILOSEC) 20 MG capsule TAKE 1 CAPSULE(20 MG) BY MOUTH DAILY   . OneTouch Delica Lancets 94W MISC Use to check glucose up to 4 times daily. Dx Code E11.9   . propranolol (INDERAL) 10 MG tablet TAKE 1 TABLET BY MOUTH EVERY 8-12 HOURS AS NEEDED FOR TREMOR   . Semaglutide, 1 MG/DOSE, (OZEMPIC, 1 MG/DOSE,) 2 MG/1.5ML SOPN Inject 1 mg into the skin once a week. Getting through PAP   . traZODone (DESYREL) 150 MG tablet Take 1 tablet (150 mg total) by mouth at bedtime.   [name] zinc gluconate 50 MG tablet Take 50 mg by mouth daily.    No facility-administered encounter medications on file as of 12/21/2020.    Current Diagnosis: Patient Active Problem List   Diagnosis Date Noted  . 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/29/2020  . Essential hypertension   . Gastroesophageal reflux disease without esophagitis   . Microscopic hematuria 09/16/2015  . Trimalleolar fracture of ankle, closed 11/17/2014  . Other and unspecified hyperlipidemia 07/24/2013  . Chest pain 10/23/2012  . Diabetes mellitus ([place]) 10/08/2012  .  Anxiety 10/08/2012    Goals Addressed   None    Requested a refill for Fluoxetine 20 mg on 12/21/20  Follow-Up:  Pharmacist Review   Charlann Lange, [place] Pharmacist Assistant [phone]

## 2020-12-21 NOTE — Telephone Encounter (Signed)
Patient has year supply already at pharmacy sent in 09/2020.   No refill needs to be sent at this time. Patient needs to contact pharmacy.

## 2020-12-21 NOTE — Telephone Encounter (Signed)
-----   Message from Rosetta Posner sent at 12/21/2020  8:58 AM EST ----- Regarding: Medication Refill Good morning can you please send a refill of Fluoxetine 20 mg to [facility] thank you.

## 2020-12-21 NOTE — Telephone Encounter (Signed)
-----   Message from [place] sent at 12/21/2020  2:41 PM EST ----- Regarding: Medication Refill Hi in regards of the refill for Fluoxetine 20 mg it needs to be sent to [facility] please and not [facility]. Thank you, her medication is going out for delivery on 12/23/20 we need this as soon as possible.

## 2020-12-22 NOTE — Progress Notes (Signed)
[place] at [facility] [address] [place], [place] [address] [phone]  Date:  12/23/2020   Name:  Abigail [name]   DOB:  26-Mar-1951   MRN:  [id number]  PCP:  Darreld Mclean, MD    Chief Complaint: ER follow up (Chest pain, seen at ER friday)   History of Present Illness:  Abigail [name] is a 70 y.o. very pleasant female patient who presents with the following:  Following up today from recent ER visit - seen in the ER on 2/11 with chest pain  70 year old female presents with concern for 3 days of central chest heaviness that radiates to her back.  Patient is 3 weeks post COVID-19 infection.  She has been vaccinated against COVID-19.  Differential diagnosis for the patient's symptoms include but are not limited to ACS, PE, aortic dissection, aortic aneurysm, cholecystitis, pancreatitis, pneumothorax, GERD, pneumonia, bronchitis.  Patient hypertensive on intake to 155/78.  Vital signs otherwise normal.  Cardiopulmonary exam is normal, abdominal exam is benign.  Patient is neurovascularly intact in all 4 extremities.  There is no lower extremity edema.  Basic laboratory studies obtained in triage.  CBC unremarkable.  CMP with mild hypokalemia to 3.4.  Elevated creatinine to 1.39, however patient's baseline  with known history of CKD.  Initial troponin negative, 4.  Given elevated risk per HEART score of 6, will proceed with delta troponin at this time.  Additionally given radiation of chest pain to the back we will proceed with blood pressure evaluation in all 4 extremities.  Significant decrease in blood pressure in the right upper extremity as compared to the left upper extremity and the lower extremities bilaterally.  Attending physician, Dr. Roslynn Amble evaluated this patient at the bedside.  Given discrepancy in blood pressures from left to right, will proceed with CT angiogram to evaluate for dissection at this time.   Delta troponin negative, 4.  EKG with new left bundle branch block.  CT angiogram negative for aortic dissection or acute aortic pathology.  Mild bronchiolitis.  4 mm nodule in the right upper lobe, no need for follow-up the patient is low risk, which she is.  Patient reevaluated chest pain now rated at 4/10.  Given reassuring physical exam, laboratory studies, and imaging studies, no further work-up is warranted in the ED at this time.  Suspect that patient's chest pain may be related to persistent pulmonary inflammation secondary to Covid.  Given new left bundle branch block, recommend she follow-up closely with cardiology.  Today is Wednesday- pt was seen in the ER last Friday  She is seeing cardiology next week -has an appointment with Dr Shelton Silvas  She still has milder discomfort in the sides of her chest - the heaviness and pressure is gone  She finished up her abx  She is using ozempic and also farxiga for her diabetes Dr Moshe Cipro is helping her out with her medications -she started her on Farxiga  We discussed rechecking A1c today, the patient has had a lot of blood work recently would like to delay  Overall her chest feels better, but she still feels a bit congested and tight.  We discussed a low-dose of prednisone and she would be interested in trying this    Lab Results  Component Value Date   HGBA1C 7.9 (H) 10/05/2020      Patient Active Problem List   Diagnosis Date Noted  . 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/29/2020  . Essential hypertension   .  Gastroesophageal reflux disease without esophagitis   . Microscopic hematuria 09/16/2015  . Trimalleolar fracture of ankle, closed 11/17/2014  . Other and unspecified hyperlipidemia 07/24/2013  . Chest pain 10/23/2012  . Diabetes mellitus ([place]) 10/08/2012  . Anxiety 10/08/2012    Past Medical History:  Diagnosis Date  . Anemia, unspecified   . Anxiety   . CKD (chronic kidney disease), stage III ([place])   . Depression   .  Diabetes mellitus    Type 2  . GERD (gastroesophageal reflux disease)   . Hematuria   . Hyperlipidemia   . Hypertension   . Mild mitral regurgitation   . Normal coronary arteries    a. By cardiac CT 08/2017.  . 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    Past Surgical History:  Procedure Laterality Date  . CHOLECYSTECTOMY N/A 06/03/2015   Procedure: LAPAROSCOPIC CHOLECYSTECTOMY WITH INTRAOPERATIVE CHOLANGIOGRAM;  Surgeon: Donnie Mesa, MD;  Location: [facility];  Service: General;  Laterality: N/A;  . COLONOSCOPY    . FRACTURE SURGERY    . HAMMER TOE SURGERY    . ORIF ANKLE FRACTURE Left 11/17/2014   Procedure: OPEN REDUCTION INTERNAL FIXATION (ORIF) LEFT TRIMALLEOLAR ANKLE FRACTURE;  Surgeon: Marianna Payment, MD;  Location: [facility];  Service: Orthopedics;  Laterality: Left;  . OVARIAN CYST REMOVAL      Social History   Tobacco Use  . Smoking status: Never Smoker  . Smokeless tobacco: Never Used  Vaping Use  . Vaping Use: Never used  Substance Use Topics  . Alcohol use: Yes    Alcohol/week: 1.0 standard drink    Types: 1 Glasses of wine per week  . Drug use: No    Family History  Problem Relation Age of Onset  . Diabetes Mother   . Hyperlipidemia Mother   . Diabetes Father   . Heart disease Father 51       AMI x 2; first AMI age 20  . Stroke Father   . Heart disease Brother        defibrillator; CHF  . Hyperlipidemia Brother     Allergies  Allergen Reactions  . Pneumovax 23 [Pneumococcal Vac Polyvalent] Other (See Comments)    Pt had severe arm pain  . Amaryl [Glimepiride] Nausea Only  . Cephalexin Hives  . Sulfa Antibiotics Rash    Medication list has been reviewed and updated.  Current Outpatient Medications on File Prior to Visit  Medication Sig Dispense Refill  . Accu-Chek FastClix Lancets MISC USE TO TEST BLOOD SUGAR UP TO FOUR TIMES DAILY AS DIRECTED 306 each 2  . ACCU-CHEK GUIDE test strip USE TO CHECK BLOOD SUGAR UP TO FOUR TIMES  DAILY 300 strip 2  . Ascorbic Acid (VITAMIN C) 1000 MG tablet Take 1,000 mg by mouth daily.    [name] atorvastatin (LIPITOR) 20 MG tablet TAKE 1 TABLET(20 MG) BY MOUTH DAILY 90 tablet 3  . blood glucose meter kit and supplies Dispense based on patient and insurance preference. Use up to four times daily as directed. (FOR ICD-10 E10.9, E11.9). 1 each 0  . Blood Glucose Monitoring Suppl (ONE TOUCH ULTRA 2) w/Device KIT Use to check glucose up to 4 times daily. Dx Code E11.9 1 kit 0  . FARXIGA 10 MG TABS tablet Take 10 mg by mouth daily.    [name] FLUoxetine (PROZAC) 20 MG capsule Take 1 capsule (20 mg total) by mouth daily. 90 capsule 3  . glucose blood (ONETOUCH ULTRA) test strip Use to  check glucose up to 4 times daily. Dx Code E11.9 400 each 1  . losartan (COZAAR) 25 MG tablet TAKE 1 TABLET(25 MG) BY MOUTH DAILY 90 tablet 3  . Multiple Vitamins-Minerals (ALIVE WOMENS GUMMY) CHEW Chew 2 tablets by mouth daily.    . nitroGLYCERIN (NITROSTAT) 0.4 MG SL tablet PLACE 1 TABLET UNDER THE TONGUE EVERY 5 MINUTES AS NEEDED FOR CHEST PAIN 25 tablet 2  . omeprazole (PRILOSEC) 20 MG capsule TAKE 1 CAPSULE(20 MG) BY MOUTH DAILY 90 capsule 3  . OneTouch Delica Lancets 20U MISC Use to check glucose up to 4 times daily. Dx Code E11.9 400 each 1  . propranolol (INDERAL) 10 MG tablet TAKE 1 TABLET BY MOUTH EVERY 8-12 HOURS AS NEEDED FOR TREMOR 270 tablet 0  . Semaglutide, 1 MG/DOSE, (OZEMPIC, 1 MG/DOSE,) 2 MG/1.5ML SOPN Inject 1 mg into the skin once a week. Getting through PAP    . traZODone (DESYREL) 150 MG tablet Take 1 tablet (150 mg total) by mouth at bedtime. 90 tablet 3  . zinc gluconate 50 MG tablet Take 50 mg by mouth daily.     No current facility-administered medications on file prior to visit.    Review of Systems:  As per HPI- otherwise negative.   Physical Examination: Vitals:   12/23/20 1417  BP: 120/76  Pulse: 97  Resp: 16  SpO2: 100%   Vitals:   12/23/20 1417  Weight: 149 lb (67.6 kg)   Height: _0  (1.651 m)   Body mass index is 24.79 kg/m. Ideal Body Weight: Weight in (lb) to have BMI = 25: 149.9  GEN: no acute distress.  Normal weight, looks well HEENT: Atraumatic, Normocephalic.   Bilateral TM wnl, oropharynx normal.  PEERL,EOMI.   There is some cerumen impaction of the right ear canal.  This is treated with irrigation and resolved, TM then normal Ears and Nose: No external deformity. CV: RRR, No M/G/R. No JVD. No thrill. No extra heart sounds. PULM: CTA B, no wheezes, crackles, rhonchi. No retractions. No resp. distress. No accessory muscle use. ABD: S, NT, ND, +BS. No rebound. No HSM. EXTR: No c/c/e PSYCH: Normally interactive. Conversant.    Assessment and Plan: Chest congestion - Plan: predniSONE (DELTASONE) 20 MG tablet  Chest pain, unspecified type  Following up today from recent ER visit for chest pain.  Abigail [name] was seen in the ER and evaluated thoroughly for chest discomfort.  Troponin normal, CT angiogram negative.  At this point we suspect her chest discomfort is residual from recent illness.  We will treat her with a conservative dose of prednisone She is also seeing cardiology next week for further evaluation/EKG change She will contact me if any change or worsening of her symptoms in the meantime  This visit occurred during the SARS-CoV-2 public health emergency.  Safety protocols were in place, including screening questions prior to the visit, additional usage of staff PPE, and extensive cleaning of exam room while observing appropriate contact time as indicated for disinfecting solutions.    Signed Lamar Blinks, MD

## 2020-12-23 ENCOUNTER — Other Ambulatory Visit: Payer: Self-pay

## 2020-12-23 ENCOUNTER — Encounter: Payer: Self-pay | Admitting: Family Medicine

## 2020-12-23 ENCOUNTER — Ambulatory Visit (INDEPENDENT_AMBULATORY_CARE_PROVIDER_SITE_OTHER): Payer: PPO | Admitting: Family Medicine

## 2020-12-23 VITALS — BP 120/76 | HR 97 | Resp 16 | Ht 65.0 in | Wt 149.0 lb

## 2020-12-23 DIAGNOSIS — R0989 Other specified symptoms and signs involving the circulatory and respiratory systems: Secondary | ICD-10-CM | POA: Diagnosis not present

## 2020-12-23 DIAGNOSIS — R079 Chest pain, unspecified: Secondary | ICD-10-CM | POA: Diagnosis not present

## 2020-12-23 MED ORDER — PREDNISONE 20 MG PO TABS: ORAL_TABLET | ORAL | 0 refills | Status: DC

## 2020-12-23 NOTE — Patient Instructions (Addendum)
Good to see you again today!  I will watch for your note from Dr Ellyn Hack.  You might call [facility] and see if they can add an A1c onto your bloodwork from yesterday? Otherwise we can plan to recheck your A1c in a couple of months We will try 5 days of low dose prednisone- 20 mg- to try and clear up your chest.  If your glucose is going over 300 with prednisone please stop it and let me know

## 2021-01-01 ENCOUNTER — Telehealth: Payer: Self-pay | Admitting: Pharmacist

## 2021-01-01 ENCOUNTER — Ambulatory Visit (INDEPENDENT_AMBULATORY_CARE_PROVIDER_SITE_OTHER): Payer: PPO | Admitting: Cardiology

## 2021-01-01 ENCOUNTER — Other Ambulatory Visit: Payer: Self-pay

## 2021-01-01 VITALS — BP 120/72 | HR 75 | Resp 18 | Ht 65.0 in | Wt 146.8 lb

## 2021-01-01 DIAGNOSIS — E1169 Type 2 diabetes mellitus with other specified complication: Secondary | ICD-10-CM

## 2021-01-01 DIAGNOSIS — E785 Hyperlipidemia, unspecified: Secondary | ICD-10-CM | POA: Diagnosis not present

## 2021-01-01 DIAGNOSIS — I10 Essential (primary) hypertension: Secondary | ICD-10-CM

## 2021-01-01 DIAGNOSIS — R072 Precordial pain: Secondary | ICD-10-CM

## 2021-01-01 NOTE — Progress Notes (Signed)
Primary Care Provider: Darreld Mclean, MD Cardiologist: No primary care provider on file.  Has been seen by both Dr. Johnsie Cancel and Dr. Meda Coffee Electrophysiologist: None  Nephrologist: Dr. Moshe Cipro   Clinic Note: Chief Complaint  Patient presents with  . Chest Pain    ER visit febrile 3:11 days of chest pain-2 weeks after COVID-19 infection.  Not really having any more the chest discomfort.   ===================================  ASSESSMENT/PLAN   Problem List Items Addressed This Visit    Chest pain - Primary (Chronic)    No longer having any chest pain.  It was atypical to begin with and that it lasted so long.  She had negative cardiac biomarkers despite having a whole day with the chest pain.  This is unlikely to be anginal in nature.  Probably post Covid symptoms, they seem to have relieved after being treated with antibiotics for bronchiolitis.  She does have some chest wall discomfort now, but not likely anginal in nature.  She has had a negative GXT in 2013, and had a relatively normal coronary CT angiogram in 2018.  Since she is not having any more chest pain, especially exertional chest pain, I think we do not need to do any kind of evaluation at this point.    If She Were to Have Any Recurrence of Symptoms, We Can Consider a Myoview Stress Test      Hyperlipidemia associated with type 2 diabetes mellitus (HCC) (Chronic)    On moderate dose of atorvastatin.  Lipids look fairly well controlled as of April of last year.  LDL was 76.  For now, would simply continue current dose.  Labs are being followed by PCP.  She is on a combination of Farxiga and Ozempic, however her last A1c was not at goal.  Defer to PCP.  Both SGLT2 inhibitor and GLP-1 agonist are cardioprotective      Essential hypertension (Chronic)    Blood pressures well controlled today.  She really is not on anything besides low-dose losartan (because of diabetes), and Inderal which she takes for  tremor.        ===================================  HPI:    Abigail [name] is a 70 y.o. female with a PMH notable for DM-2, HLD, relatively normal coronary CTA in October 2018 who is being seen today for the evaluation of CHEST HEAVINESS at the request of Copland, Gay Filler, MD.  Aneisa Karren was seen in December 2013 by Dr. Johnsie Cancel for chest pain-felt to be atypical.  GXT ordered. => Normal.  Recent Hospitalizations:   [facility] for chest pain 12/18/2020 - woke up 3:30 AM with heaviness in chest on the Wed-lasted til ~7:30-8 pm, but lingered all day on Thurs -back again on the Friday; ruled out for MI, recommended outpatient follow-up  (Abnormal BP findings-different on both arms) CTA showed no PE and no aortic dissection with no subclavian stenosis.  Mild bronchiolitis. => Discharged with antibiotics  She did note that she was about 3 weeks out from COVID-19 infection (vaccinated and boosted).  Only noted some fatigue.  No other symptoms.  She was seen by Dr. Lamar Blinks on December 23, 2020 by PCP for chest congestion/chest pain.  She described 3 days of central chest heaviness radiating to the back. => Said that her chest heaviness and pressure have resolved.  Just had some mild discomfort on the sides of her chest.  Reviewed  CV studies:    The following studies were reviewed today: (if available,  images/films reviewed: From Epic Chart or Care Everywhere) --[facility] stay in October 2018 for chest pain.  GXT January 2014: Normal exercise treadmill stress test.  No EKG changes.  Average exercise tolerance.  (7 METS)  Coronary CTA 08/2017 no evidence of CAD.  Coronary calcium score 0.  TTE 08/18/2017: Normal LV function EF 55-60%.  No RWM A.  Mild MR.  Interval History:   Sierria [name] presents here today because of the ER visit.  She is felt congested and tight when she saw Dr. Lorelei Pont, but the heaviness was gone.  I think this is probably more related to  bronchiolitis from COVID and not cardiac in nature.  She really has not had any further symptoms since visit with Dr. Lorelei Pont.  She now walks the dog about 20-30 minutes a day and a decent pace and does not have any chest discomfort.  She denies any exertional dyspnea.  No PND orthopnea or edema.  CV Review of Symptoms (Summary): no chest pain or dyspnea on exertion positive for - Several days worth of chest heaviness/tightness and aching but is no longer present.  No longer with exertion or at rest. negative for - edema, irregular heartbeat, orthopnea, palpitations, paroxysmal nocturnal dyspnea, rapid heart rate, shortness of breath or Lightheadedness, dizziness, syncope/near syncope, TIA/amaurosis fugax, claudication  The patient does not have symptoms concerning for COVID-19 infection (fever, chills, cough, or new shortness of breath).   REVIEWED OF SYSTEMS   Review of Systems  Constitutional: Negative for malaise/fatigue (Energy level has seem to have bounced back) and weight loss.  HENT: Negative for congestion (No longer noted in the chest congestion.) and nosebleeds.   Respiratory: Negative for cough and shortness of breath.        This resolved by the time she saw Dr. Lorelei Pont  Gastrointestinal: Negative for abdominal pain, blood in stool, diarrhea and melena.  Genitourinary: Negative for hematuria.  Musculoskeletal: Negative for joint pain.  Neurological: Positive for dizziness (Occasionally gets orthostatic dizziness.). Negative for headaches.  Psychiatric/Behavioral: Negative for depression and memory loss. The patient is nervous/anxious. The patient does not have insomnia.    I have reviewed and (if needed) personally updated the patient's problem list, medications, allergies, past medical and surgical history, social and family history.   PAST MEDICAL HISTORY   Past Medical History:  Diagnosis Date  . Anemia, unspecified   . Anxiety   . CKD (chronic kidney disease), stage III  ([place])   . Depression   . Diabetes mellitus type II, non insulin dependent ([place])    On Ozempic and [place]  . GERD (gastroesophageal reflux disease)   . Hematuria   . Hyperlipidemia   . Hypertension   . Mild mitral regurgitation 08/2017   Essentially normal-mild MR on echo.  . Normal coronary arteries    Coronary CTA: No evidence of CAD.  Coronary calcium score 0.  . 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    PAST SURGICAL HISTORY   Past Surgical History:  Procedure Laterality Date  . CHOLECYSTECTOMY N/A 06/03/2015   Procedure: LAPAROSCOPIC CHOLECYSTECTOMY WITH INTRAOPERATIVE CHOLANGIOGRAM;  Surgeon: Donnie Mesa, MD;  Location: [facility];  Service: General;  Laterality: N/A;  . COLONOSCOPY    . HAMMER TOE SURGERY    . ORIF ANKLE FRACTURE Left 11/17/2014   Procedure: OPEN REDUCTION INTERNAL FIXATION (ORIF) LEFT TRIMALLEOLAR ANKLE FRACTURE;  Surgeon: Marianna Payment, MD;  Location: [facility];  Service: Orthopedics;  Laterality: Left;  . OVARIAN CYST REMOVAL  Immunization History  Administered Date(s) Administered  . Fluad Quad(high Dose 65+) 07/01/2019  . Influenza Whole 08/08/2013  . Influenza, High Dose Seasonal PF 08/03/2016, 08/17/2017  . Influenza, Seasonal, Injecte, Preservative Fre 10/08/2012  . Influenza,inj,Quad PF,6+ Mos 08/05/2015  . Influenza-Unspecified 08/08/2014, 08/13/2020  . PFIZER(Purple Top)SARS-COV-2 Vaccination 01/13/2020, 02/12/2020, 08/13/2020  . Pneumococcal Conjugate-13 11/03/2014  . Pneumococcal Polysaccharide-23 02/10/2016  . Zoster Recombinat (Shingrix) 09/13/2018    MEDICATIONS/ALLERGIES   Current Meds  Medication Sig  . Accu-Chek FastClix Lancets MISC USE TO TEST BLOOD SUGAR UP TO FOUR TIMES DAILY AS DIRECTED  . ACCU-CHEK GUIDE test strip USE TO CHECK BLOOD SUGAR UP TO FOUR TIMES DAILY  . Ascorbic Acid (VITAMIN C) 1000 MG tablet Take 1,000 mg by mouth daily.  [name] atorvastatin (LIPITOR) 20 MG tablet TAKE 1 TABLET(20 MG) BY MOUTH  DAILY  . blood glucose meter kit and supplies Dispense based on patient and insurance preference. Use up to four times daily as directed. (FOR ICD-10 E10.9, E11.9).  [name] Blood Glucose Monitoring Suppl (ONE TOUCH ULTRA 2) w/Device KIT Use to check glucose up to 4 times daily. Dx Code E11.9  . FARXIGA 10 MG TABS tablet Take 10 mg by mouth daily.  [name] FLUoxetine (PROZAC) 20 MG capsule Take 1 capsule (20 mg total) by mouth daily.  [name] glucose blood (ONETOUCH ULTRA) test strip Use to check glucose up to 4 times daily. Dx Code E11.9  . losartan (COZAAR) 25 MG tablet TAKE 1 TABLET(25 MG) BY MOUTH DAILY  . Multiple Vitamins-Minerals (ALIVE WOMENS GUMMY) CHEW Chew 2 tablets by mouth daily.  . nitroGLYCERIN (NITROSTAT) 0.4 MG SL tablet PLACE 1 TABLET UNDER THE TONGUE EVERY 5 MINUTES AS NEEDED FOR CHEST PAIN  . omeprazole (PRILOSEC) 20 MG capsule TAKE 1 CAPSULE(20 MG) BY MOUTH DAILY  . OneTouch Delica Lancets 19J MISC Use to check glucose up to 4 times daily. Dx Code E11.9  . propranolol (INDERAL) 10 MG tablet TAKE 1 TABLET BY MOUTH EVERY 8-12 HOURS AS NEEDED FOR TREMOR  . Semaglutide, 1 MG/DOSE, (OZEMPIC, 1 MG/DOSE,) 2 MG/1.5ML SOPN Inject 1 mg into the skin once a week. Getting through PAP  . traZODone (DESYREL) 150 MG tablet Take 1 tablet (150 mg total) by mouth at bedtime.  [name] zinc gluconate 50 MG tablet Take 50 mg by mouth daily.    Allergies  Allergen Reactions  . Pneumovax 23 [Pneumococcal Vac Polyvalent] Other (See Comments)    Pt had severe arm pain  . Amaryl [Glimepiride] Nausea Only  . Cephalexin Hives  . Sulfa Antibiotics Rash    SOCIAL HISTORY/FAMILY HISTORY   Reviewed in Epic:  Pertinent findings:  Social History   Tobacco Use  . Smoking status: Never Smoker  . Smokeless tobacco: Never Used  Vaping Use  . Vaping Use: Never used  Substance Use Topics  . Alcohol use: Yes    Alcohol/week: 1.0 standard drink    Types: 1 Glasses of wine per week  . Drug use: No   Social History    Social History Narrative   Marital status: married x 1970. ->  Now widowed      Children:  2 children; 7 grandchildren.      Employment:  [employer]. ->  Retired      Tobacco: none       Alcohol: sporadic       Exercises: walking 7 days per week.  20-30 minutes.  Walks her dog.   Family History  Problem Relation Age  of Onset  . Diabetes Mother   . Hyperlipidemia Mother   . Diabetes Father   . Heart disease Father 87       AMI x 2; first AMI age 25  . Stroke Father   . Hypertension Father   . Hyperlipidemia Brother   . AAA (abdominal aortic aneurysm) Brother   . Congestive Heart Failure Brother        s/p ICD  . Coronary artery disease Brother        defibrillator; CHF  . Kidney cancer Brother        Status post nephrectomy  . Other Sister 69       Died in a car accident  family history of coronary artery disease in a first-degree relative with onset less than age 64  OBJCTIVE -PE, EKG, labs   Wt Readings from Last 3 Encounters:  01/01/21 146 lb 12.8 oz (66.6 kg)  12/23/20 149 lb (67.6 kg)  12/18/20 147 lb (66.7 kg)    Physical Exam: BP 120/72 (BP Location: Left Arm, Patient Position: Sitting, Cuff Size: Large)   Pulse 75   Resp 18   Ht $R'5\' 5"'BW$  (1.651 m)   Wt 146 lb 12.8 oz (66.6 kg)   BMI 24.43 kg/m  Physical Exam Vitals reviewed.  Constitutional:      General: She is not in acute distress.    Appearance: Normal appearance. She is normal weight. She is not ill-appearing or toxic-appearing.  HENT:     Head: Normocephalic and atraumatic.  Neck:     Vascular: No carotid bruit, hepatojugular reflux or JVD.  Cardiovascular:     Rate and Rhythm: Normal rate and regular rhythm.  No extrasystoles are present.    Chest Wall: PMI is not displaced.     Pulses: Normal pulses.     Heart sounds: Normal heart sounds. No murmur heard. No friction rub. No gallop.   Pulmonary:     Effort: Pulmonary effort is normal. No respiratory distress.     Breath  sounds: Normal breath sounds. No wheezing, rhonchi or rales.  Chest:     Chest wall: Tenderness (Maybe little bit of costosternal tenderness, along the lower rib cage, but not much.) present.  Abdominal:     General: Bowel sounds are normal. There is no distension.     Palpations: Abdomen is soft. There is no mass (No HSM).     Tenderness: There is no abdominal tenderness.  Musculoskeletal:        General: No swelling. Normal range of motion.     Cervical back: Normal range of motion and neck supple.  Skin:    General: Skin is warm and dry.  Neurological:     General: No focal deficit present.     Mental Status: She is alert and oriented to person, place, and time.     Motor: No weakness.     Gait: Gait normal.  Psychiatric:        Mood and Affect: Mood normal.        Behavior: Behavior normal.        Thought Content: Thought content normal.        Judgment: Judgment normal.      Adult ECG Report From ER visit: Rate: 81 ;  Rhythm: normal sinus rhythm and LBBB.  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  Recent Labs: Reviewed Lab Results  Component Value Date   CHOL  173 02/24/2020   HDL 56.60 02/24/2020   LDLCALC 76 02/24/2020   TRIG 199.0 (H) 02/24/2020   CHOLHDL 3 02/24/2020   Lab Results  Component Value Date   CREATININE 1.39 (H) 12/18/2020   BUN 15 12/18/2020   NA 138 12/18/2020   K 3.4 (L) 12/18/2020   CL 102 12/18/2020   CO2 26 12/18/2020   CBC Latest Ref Rng & Units 12/18/2020 10/05/2020 02/24/2020  WBC 4.0 - 10.5 K/uL 6.1 6.7 7.7  Hemoglobin 12.0 - 15.0 g/dL 12.1 13.1 13.1  Hematocrit 36.0 - 46.0 % 37.4 38.9 39.1  Platelets 150 - 400 K/uL 175 205.0 239.0    Lab Results  Component Value Date   TSH 2.16 02/24/2020    ==================================================  COVID-19 Education: The signs and symptoms of COVID-19 were discussed with the patient and how to  seek care for testing (follow up with PCP or arrange E-visit).   The importance of social distancing and COVID-19 vaccination was discussed today. The patient is practicing social distancing & Masking.   I spent a total of 21 minutes with the patient spent in direct patient consultation.  Additional time spent with chart review  / charting (studies, outside notes, etc): 20 min Total Time: 41 min   Current medicines are reviewed at length with the patient today.  (+/- concerns) N/A  This visit occurred during the SARS-CoV-2 public health emergency.  Safety protocols were in place, including screening questions prior to the visit, additional usage of staff PPE, and extensive cleaning of exam room while observing appropriate contact time as indicated for disinfecting solutions.  Notice: This dictation was prepared with Dragon dictation along with smaller phrase technology. Any transcriptional errors that result from this process are unintentional and may not be corrected upon review.  Patient Instructions / Medication Changes & Studies & Tests Ordered   Patient Instructions  Medication Instructions:  No changes  *If you need a refill on your cardiac medications before your next appointment, please call your pharmacy*  Other Instructions  If more chest discomfort returns in the few months next - please call office - a exercise stress Myoview will be ordered   Lab Work:  Not needed   Testing/Procedures:  Not needed  Follow-Up: At [facility], you and your health needs are our priority.  As part of our continuing mission to provide you with exceptional heart care, we have created designated Provider Care Teams.  These Care Teams include your primary Cardiologist (physician) and Advanced Practice Providers (APPs -  Physician Assistants and Nurse Practitioners) who all work together to provide you with the care you need, when you need it.     Your next appointment:   4 month(s) (  if no symptoms can cancel)   The format for your next appointment:   In Person  Provider:   Glenetta Hew, MD    Studies Ordered:   No orders of the defined types were placed in this encounter.    Glenetta Hew, M.D., M.S. Interventional Cardiologist   Pager # [phone] Phone # [phone] [address] [place], [place] [address]   Thank you for choosing Heartcare at [facility]!!

## 2021-01-01 NOTE — Progress Notes (Addendum)
Chronic Care Management Pharmacy Assistant   Name: Abigail [name]  MRN: [id number] DOB: 03-Jul-1951  Reason for Encounter: Medication Review   PCP : Darreld Mclean, MD  Allergies:   Allergies  Allergen Reactions   Pneumovax 23 [Pneumococcal Vac Polyvalent] Other (See Comments)    Pt had severe arm pain   Amaryl [Glimepiride] Nausea Only   Cephalexin Hives   Sulfa Antibiotics Rash    Medications: Outpatient Encounter Medications as of 01/01/2021  Medication Sig Note   Accu-Chek FastClix Lancets MISC USE TO TEST BLOOD SUGAR UP TO FOUR TIMES DAILY AS DIRECTED    ACCU-CHEK GUIDE test strip USE TO CHECK BLOOD SUGAR UP TO FOUR TIMES DAILY    Ascorbic Acid (VITAMIN C) 1000 MG tablet Take 1,000 mg by mouth daily.    atorvastatin (LIPITOR) 20 MG tablet TAKE 1 TABLET(20 MG) BY MOUTH DAILY    blood glucose meter kit and supplies Dispense based on patient and insurance preference. Use up to four times daily as directed. (FOR ICD-10 E10.9, E11.9).    Blood Glucose Monitoring Suppl (ONE TOUCH ULTRA 2) w/Device KIT Use to check glucose up to 4 times daily. Dx Code E11.9    FARXIGA 10 MG TABS tablet Take 10 mg by mouth daily.    FLUoxetine (PROZAC) 20 MG capsule Take 1 capsule (20 mg total) by mouth daily.    glucose blood (ONETOUCH ULTRA) test strip Use to check glucose up to 4 times daily. Dx Code E11.9    losartan (COZAAR) 25 MG tablet TAKE 1 TABLET(25 MG) BY MOUTH DAILY    Multiple Vitamins-Minerals (ALIVE WOMENS GUMMY) CHEW Chew 2 tablets by mouth daily.    nitroGLYCERIN (NITROSTAT) 0.4 MG SL tablet PLACE 1 TABLET UNDER THE TONGUE EVERY 5 MINUTES AS NEEDED FOR CHEST PAIN 06/12/2020: Has not had to use, but has it just in case   omeprazole (PRILOSEC) 20 MG capsule TAKE 1 CAPSULE(20 MG) BY MOUTH DAILY    OneTouch Delica Lancets 58K MISC Use to check glucose up to 4 times daily. Dx Code E11.9    propranolol (INDERAL) 10 MG tablet TAKE 1 TABLET BY MOUTH EVERY 8-12 HOURS AS NEEDED FOR  TREMOR    Semaglutide, 1 MG/DOSE, (OZEMPIC, 1 MG/DOSE,) 2 MG/1.5ML SOPN Inject 1 mg into the skin once a week. Getting through PAP    traZODone (DESYREL) 150 MG tablet Take 1 tablet (150 mg total) by mouth at bedtime.    zinc gluconate 50 MG tablet Take 50 mg by mouth daily.    No facility-administered encounter medications on file as of 01/01/2021.    Current Diagnosis: Patient Active Problem List   Diagnosis Date Noted   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/29/2020   Essential hypertension    Gastroesophageal reflux disease without esophagitis    Microscopic hematuria 09/16/2015   Trimalleolar fracture of ankle, closed 11/17/2014   Hyperlipidemia associated with type 2 diabetes mellitus ([place]) 07/24/2013   Chest pain 10/23/2012   Type 2 diabetes mellitus without complication, without long-term current use of insulin ([employer]) 10/08/2012   Anxiety 10/08/2012    Goals Addressed   None    Reviewed chart for medication changes ahead of medication coordination call.  Office Visits: 12-23-2020 (PCP) Patient presented in the office with chest congestion f/u from recent ER visit.Medication changes: Prednisone 20 mg tab daily x 5 days.  No Consults or hospital visits since last care coordination call.  No medication changes indicated  BP Readings from Last 3 Encounters:  01/01/21 120/72  12/23/20 120/76  12/18/20 (!) 145/78    Lab Results  Component Value Date   HGBA1C 7.9 (H) 10/05/2020     Patient obtains medications through Vials  90 Days  Pt requesting Vials   Last adherence delivery included:  Trazodone 150 mg  Atorvastatin 20 mg  Patient declined the following medications last month due enough supply on hand: Losartan 25 mg  Omeprazole 20 mg  Propranolol 10 mg  Trazodone 150 mg  Atorvastatin 20 mg   Patient is due for next adherence delivery on: 01-18-2021. Called patient and reviewed medications and coordinated delivery.  This delivery to include: Losartan 25 mg  Trazodone 150 mg   Atorvastatin 20 mg   Coordinated acute fill for Omeprazole 20 mg  to be delivered 01-11-2021.  Patient declined the following medications due to enough on hand: Propranolol 10 mg  Fluoxetine 20 mg  Patient does not need refills at this time.  Confirmed delivery date of 01-18-2021, advised patient that pharmacy will contact them the morning of delivery.   Follow-Up:  Pharmacist Review   Fanny Skates, [place] Pharmacist Assistant [phone]  6 minutes spent in review, coordination, and documentation.  Reviewed by: Beverly Milch, PharmD Clinical Pharmacist [facility] [phone]

## 2021-01-01 NOTE — Patient Instructions (Signed)
Medication Instructions:  No changes  *If you need a refill on your cardiac medications before your next appointment, please call your pharmacy*  Other Instructions  If more chest discomfort returns in the few months next - please call office - a exercise stress Myoview will be ordered   Lab Work:  Not needed   Testing/Procedures:  Not needed  Follow-Up: At [facility], you and your health needs are our priority.  As part of our continuing mission to provide you with exceptional heart care, we have created designated Provider Care Teams.  These Care Teams include your primary Cardiologist (physician) and Advanced Practice Providers (APPs -  Physician Assistants and Nurse Practitioners) who all work together to provide you with the care you need, when you need it.     Your next appointment:   4 month(s) ( if no symptoms can cancel)   The format for your next appointment:   In Person  Provider:   Glenetta Hew, MD

## 2021-01-24 ENCOUNTER — Encounter: Payer: Self-pay | Admitting: Cardiology

## 2021-01-24 NOTE — Assessment & Plan Note (Signed)
Blood pressures well controlled today.  She really is not on anything besides low-dose losartan (because of diabetes), and Inderal which she takes for tremor.

## 2021-01-24 NOTE — Assessment & Plan Note (Signed)
On moderate dose of atorvastatin.  Lipids look fairly well controlled as of April of last year.  LDL was 76.  For now, would simply continue current dose.  Labs are being followed by PCP.  She is on a combination of Farxiga and Ozempic, however her last A1c was not at goal.  Defer to PCP.  Both SGLT2 inhibitor and GLP-1 agonist are cardioprotective

## 2021-01-24 NOTE — Assessment & Plan Note (Signed)
No longer having any chest pain.  It was atypical to begin with and that it lasted so long.  She had negative cardiac biomarkers despite having a whole day with the chest pain.  This is unlikely to be anginal in nature.  Probably post Covid symptoms, they seem to have relieved after being treated with antibiotics for bronchiolitis.  She does have some chest wall discomfort now, but not likely anginal in nature.  She has had a negative GXT in 2013, and had a relatively normal coronary CT angiogram in 2018.  Since she is not having any more chest pain, especially exertional chest pain, I think we do not need to do any kind of evaluation at this point.    If She Were to Have Any Recurrence of Symptoms, We Can Consider a Myoview Stress Test

## 2021-01-25 ENCOUNTER — Ambulatory Visit (INDEPENDENT_AMBULATORY_CARE_PROVIDER_SITE_OTHER): Payer: PPO | Admitting: Pharmacist

## 2021-01-25 ENCOUNTER — Encounter: Payer: Self-pay | Admitting: Family Medicine

## 2021-01-25 DIAGNOSIS — E785 Hyperlipidemia, unspecified: Secondary | ICD-10-CM | POA: Diagnosis not present

## 2021-01-25 DIAGNOSIS — E119 Type 2 diabetes mellitus without complications: Secondary | ICD-10-CM

## 2021-01-25 DIAGNOSIS — N1832 Chronic kidney disease, stage 3b: Secondary | ICD-10-CM

## 2021-01-25 NOTE — Progress Notes (Signed)
Chronic Care Management Pharmacy Note  01/25/2021 Name:  Abigail [name] MRN:  [id number] DOB:  1951/08/29  Subjective: Abigail [name] is an 70 y.o. year old female who is a primary patient of Copland, Gay Filler, MD.  The CCM team was consulted for assistance with disease management and care coordination needs.    Engaged with patient by telephone for follow up visit in response to provider referral for pharmacy case management and/or care coordination services.   Consent to Services:  The patient was given information about Chronic Care Management services, agreed to services, and gave verbal consent prior to initiation of services.  Please see initial visit note for detailed documentation.   Patient Care Team: Copland, Gay Filler, MD as PCP - General (Family Medicine) Day, Melvenia Beam, [facility] (Inactive) as Pharmacist (Pharmacist)  Recent office visits: 12/23/2020 - PCP (Dr Lorelei Pont): F/U from recent ER visit on 2/11 with chest pain. She was 2 weeks post COVID infection at presentation to hospital. Dr Lorelei Pont suspected her chest discomfort is residual from recent illness.  Prescribed prednisone for noted chest congestion. Continue with plan to f/u with cardiology next week for further evaluation/EKG.   Recent consult visits: 01/01/2021 - Cardio (Dr Ellyn Hack): precordial chest pain; EKG showed complete left bundle branch block. No medication changes. Cardio f/u 4 months  Hospital visits: Medication Reconciliation was completed by comparing discharge summary, patient's EMR and Pharmacy list, and upon discussion with patient.  ED visit on 12/23/20 due to 3 days of central chest heaviness that radiates to her back. Patient is 3 weeks post COVID-19 infection.    Patient hypertensive on intake to 155/78. Given elevated risk per HEART score of 6, will proceed with delta troponin at this time. Additionally given radiation of chest pain to the back we will proceed with blood pressure evaluation in  all 4 extremities.Significant decrease in blood pressure in the right upper extremity as compared to the left upper extremity and the lower extremities bilaterally.  CT angiogram negative for aortic dissection or acute aortic pathology. Mild bronchiolitis. 4 mm nodule in the right upper lobe, no need for follow-up the patient is low risk.Givenreassuring physical exam, laboratory studies, and imaging studies, no further work-up is warranted in the ED at this time. Suspect that patient's chest pain may be related to persistent pulmonary inflammation secondary to Covid. Given new left bundle branch block, recommend she follow-up closely with cardiology.  New?Medications Started at [facility] Discharge:?? none  Medication Changes at Hospital Discharge: none  Medications Discontinued at Hospital Discharge: none  Medications that remain the same after Hospital Discharge:??  -All medications will remain the same.    Objective:  Lab Results  Component Value Date   CREATININE 1.39 (H) 12/18/2020   CREATININE 1.47 (H) 10/05/2020   CREATININE 1.53 (H) 02/24/2020    Lab Results  Component Value Date   HGBA1C 7.9 (H) 10/05/2020   Last diabetic Eye exam:  Lab Results  Component Value Date/Time   HMDIABEYEEXA No Retinopathy 08/17/2020 12:00 AM    Last diabetic Foot exam: No results found for: HMDIABFOOTEX      Component Value Date/Time   CHOL 173 02/24/2020 1552   TRIG 199.0 (H) 02/24/2020 1552   HDL 56.60 02/24/2020 1552   CHOLHDL 3 02/24/2020 1552   VLDL 39.8 02/24/2020 1552   LDLCALC 76 02/24/2020 1552    Hepatic Function Latest Ref Rng & Units 12/18/2020 10/05/2020 10/23/2019  Total Protein 6.5 - 8.1 g/dL 6.6 6.7 6.6  Albumin 3.5 -  5.0 g/dL 3.6 4.2 4.3  AST 15 - 41 U/L 20 16 13   ALT 0 - 44 U/L 17 13 11   Alk Phosphatase 38 - 126 U/L 70 85 97  Total Bilirubin 0.3 - 1.2 mg/dL 0.2(L) 0.4 0.3    Lab Results  Component Value Date/Time   TSH 2.16 02/24/2020 03:52 PM   TSH  1.45 01/02/2017 11:21 AM   FREET4 0.91 03/17/2016 11:07 AM    CBC Latest Ref Rng & Units 12/18/2020 10/05/2020 02/24/2020  WBC 4.0 - 10.5 K/uL 6.1 6.7 7.7  Hemoglobin 12.0 - 15.0 g/dL 12.1 13.1 13.1  Hematocrit 36.0 - 46.0 % 37.4 38.9 39.1  Platelets 150 - 400 K/uL 175 205.0 239.0    Lab Results  Component Value Date/Time   VD25OH 37.36 02/24/2020 03:52 PM    Clinical ASCVD: No  The 10-year ASCVD risk score Mikey Bussing DC Jr., et al., 2013) is: 21.7%   Values used to calculate the score:     Age: 71 years     Sex: Female     Is Non-Hispanic African American: No     Diabetic: Yes     Tobacco smoker: No     Systolic Blood Pressure: 629 mmHg     Is BP treated: Yes     HDL Cholesterol: 56.6 mg/dL     Total Cholesterol: 173 mg/dL    Other: (CHADS2VASc if Afib, PHQ9 if depression, MMRC or CAT for COPD, ACT, DEXA)  Social History   Tobacco Use  Smoking Status Never Smoker  Smokeless Tobacco Never Used   BP Readings from Last 3 Encounters:  01/01/21 120/72  12/23/20 120/76  12/18/20 (!) 145/78   Pulse Readings from Last 3 Encounters:  01/01/21 75  12/23/20 97  12/18/20 78   Wt Readings from Last 3 Encounters:  01/01/21 146 lb 12.8 oz (66.6 kg)  12/23/20 149 lb (67.6 kg)  12/18/20 147 lb (66.7 kg)    Assessment: Review of patient past medical history, allergies, medications, health status, including review of consultants reports, laboratory and other test data, was performed as part of comprehensive evaluation and provision of chronic care management services.   SDOH:  (Social Determinants of Health) assessments and interventions performed:    CCM Care Plan  Allergies  Allergen Reactions  . Pneumovax 23 [Pneumococcal Vac Polyvalent] Other (See Comments)    Pt had severe arm pain  . Amaryl [Glimepiride] Nausea Only  . Cephalexin Hives  . Sulfa Antibiotics Rash    Medications Reviewed Today    Reviewed by Leonie Man, MD (Physician) on 01/24/21 at 2344  Med  List Status: <None>  Medication Order Taking? Sig Documenting Provider Last Dose Status Informant  Accu-Chek FastClix Lancets MISC [id number] Yes USE TO TEST BLOOD SUGAR UP TO FOUR TIMES DAILY AS DIRECTED Copland, Gay Filler, MD Taking Active   ACCU-CHEK GUIDE test strip [id number] Yes USE TO CHECK BLOOD SUGAR UP TO FOUR TIMES DAILY Copland, Gay Filler, MD Taking Active   Ascorbic Acid (VITAMIN C) 1000 MG tablet [id number] Yes Take 1,000 mg by mouth daily. [provider] Taking Active   atorvastatin (LIPITOR) 20 MG tablet [id number] Yes TAKE 1 TABLET(20 MG) BY MOUTH DAILY Copland, Gay Filler, MD Taking Active   blood glucose meter kit and supplies [id number] Yes Dispense based on patient and insurance preference. Use up to four times daily as directed. (FOR ICD-10 E10.9, E11.9). Copland, Gay Filler, MD Taking Active   Blood Glucose Monitoring Suppl (ONE TOUCH ULTRA 2)  w/Device KIT [id number] Yes Use to check glucose up to 4 times daily. Dx Code E11.9 Copland, Gay Filler, MD Taking Active   FARXIGA 10 MG TABS tablet [id number] Yes Take 10 mg by mouth daily. [provider] Taking Active   FLUoxetine (PROZAC) 20 MG capsule [id number] Yes Take 1 capsule (20 mg total) by mouth daily. Copland, Gay Filler, MD Taking Active   glucose blood (ONETOUCH ULTRA) test strip [id number] Yes Use to check glucose up to 4 times daily. Dx Code E11.9 Copland, Gay Filler, MD Taking Active   losartan (COZAAR) 25 MG tablet [id number] Yes TAKE 1 TABLET(25 MG) BY MOUTH DAILY Copland, Gay Filler, MD Taking Active   Multiple Vitamins-Minerals (ALIVE WOMENS GUMMY) CHEW [id number] Yes Chew 2 tablets by mouth daily. [provider] Taking Active Self  nitroGLYCERIN (NITROSTAT) 0.4 MG SL tablet [id number] Yes PLACE 1 TABLET UNDER THE TONGUE EVERY 5 MINUTES AS NEEDED FOR CHEST PAIN Copland, Gay Filler, MD Taking Active            Med Note [place]   Fri Jun 12, 2020  4:22 PM) Has not had to use, but has it just in case   omeprazole (PRILOSEC) 20 MG capsule [id number] Yes TAKE 1 CAPSULE(20 MG) BY MOUTH DAILY Copland, Gay Filler, MD Taking Active   OneTouch Delica Lancets 09T MISC [id number] Yes Use to check glucose up to 4 times daily. Dx Code E11.9 Copland, Gay Filler, MD Taking Active   propranolol (INDERAL) 10 MG tablet [id number] Yes TAKE 1 TABLET BY MOUTH EVERY 8-12 HOURS AS NEEDED FOR TREMOR Copland, Gay Filler, MD Taking Active   Semaglutide, 1 MG/DOSE, (OZEMPIC, 1 MG/DOSE,) 2 MG/1.5ML SOPN [id number] Yes Inject 1 mg into the skin once a week. Getting through PAP [provider] Taking Active   traZODone (DESYREL) 150 MG tablet [id number] Yes Take 1 tablet (150 mg total) by mouth at bedtime. Copland, Gay Filler, MD Taking Active   zinc gluconate 50 MG tablet [id number] Yes Take 50 mg by mouth daily. [provider] Taking Active           Patient Active Problem List   Diagnosis Date Noted  . 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/29/2020  . Essential hypertension   . Gastroesophageal reflux disease without esophagitis   . Microscopic hematuria 09/16/2015  . Trimalleolar fracture of ankle, closed 11/17/2014  . Hyperlipidemia associated with type 2 diabetes mellitus ([place]) 07/24/2013  . Chest pain 10/23/2012  . Type 2 diabetes mellitus without complication, without long-term current use of insulin ([place]) 10/08/2012  . Anxiety 10/08/2012    Immunization History  Administered Date(s) Administered  . Fluad Quad(high Dose 65+) 07/01/2019  . Influenza Whole 08/08/2013  . Influenza, High Dose Seasonal PF 08/03/2016, 08/17/2017  . Influenza, Seasonal, Injecte, Preservative Fre 10/08/2012  . Influenza,inj,Quad PF,6+ Mos 08/05/2015  . Influenza-Unspecified 08/08/2014, 08/13/2020  . PFIZER(Purple Top)SARS-COV-2 Vaccination 01/13/2020, 02/12/2020, 08/13/2020  . Pneumococcal Conjugate-13 11/03/2014  . Pneumococcal Polysaccharide-23 02/10/2016  . Zoster Recombinat (Shingrix) 09/13/2018    Conditions to be  addressed/monitored: HTN, HLD, DMII, CKD Stage 3B and Depression  There are no care plans that you recently modified to display for this patient.   Medication Assistance: Application for [employer] and NovoNordisk mailed to patient today.  See plan of care for additional detail.  Patient's preferred pharmacy is: [facility] - [place], [place] - [address] [place] [address] Phone: [phone] Fax: [phone]  Diabetes / CKD 3B:  Patient reports that BG has been much improved since addition for Farziga 70m daily to Ozempic 164mweekly. BG ranges from 135 to 170. Checking BG bid.  She has used free trial coupon to get 1 month supply. She was also sent coupon care which I don't think she will be able to use. She thought she had applied to MAP but when I called [employer] program they only had [id number] which was denied. Patient's financial status has changed so will apply again. Also will apply for patient assistance for Ozempic. Applications mailed to patient today.   HTN / tremor:  Patient reports that she is not checking BP at home. She is taking losartan 2575maily for BP and also propranolol 20m23m2 tabet = 20mg30mly for tremors. Last BP was 120/72 at Dr Harding's office and HR was 75. Both HTN and tremors are well controlled with current therapy.   Medication Management:  Patient states she is please with getting med delivered from [facility]. She does not have meds synced yet but they are working toward this.  Working with patient and provider to improve medication access thru MAP.   Follow Up:  Patient agrees to Care Plan and Follow-up.  Plan: Telephone follow up appointment with care management team member scheduled for:  1 month to check on MAP  VernonrmD Clinical Pharmacist [facility]

## 2021-01-25 NOTE — Patient Instructions (Signed)
Visit Information  PATIENT GOALS: Goals Addressed            This Visit's Progress   . Chronic Care Management Pharmacy Care Plan       CARE PLAN ENTRY (see longitudinal plan of care for additional care plan information)  Current Barriers:  . Chronic Disease Management support, education, and care coordination needs related to Diabetes, Hypertension, Hyperlipidemia, Chest Pain, Anxiety, Tremor, Insomnia, GERD   Hypertension BP Readings from Last 3 Encounters:  01/01/21 120/72  12/23/20 120/76  12/18/20 (!) 145/78   . Pharmacist Clinical Goal(s): o Over the next 90 days, patient will work with PharmD and providers to maintain BP goal <140/90 . Current regimen:  o Losartan 25mg  daily . Patient self care activities - Over the next 90 days, patient will: o Maintain hypertension medication regimen.  Hyperlipidemia Lab Results  Component Value Date/Time   LDLCALC 76 02/24/2020 03:52 PM   . Pharmacist Clinical Goal(s): o Over the next 90 days, patient will work with PharmD and providers to maintain LDL goal < 100 . Current regimen:  o Atorvastatin 20mg  daily . Patient self care activities - Over the next 90 days, patient will: o Maintain cholesterol medication regimen.  Diabetes Lab Results  Component Value Date/Time   HGBA1C 7.9 (H) 10/05/2020 09:33 AM   HGBA1C 7.9 (H) 02/24/2020 03:52 PM   . Pharmacist Clinical Goal(s): o Over the next 90 days, patient will work with PharmD and providers to achieve A1c goal <7% . Current regimen:  o Ozempic 1mg  daily o Farxiga 10mg  daily (added by Dr Moshe Cipro for blood glucose and kidney function) . Interventions: o Sent patient applications by mail for [employer] and [employer] assistance. o Requested last A1c result from Dr Shelva Majestic office (done Feb 2022) . Patient self care activities - Over the next 90 days, patient will: o Check blood sugar twice daily, document, and provide at future appointments o Contact provider with  any episodes of hypoglycemia o Complete PAP for Farxiga and Ozempic  Medication management . Pharmacist Clinical Goal(s): o Over the next 90 days, patient will work with PharmD and providers to achieve optimal medication adherence . Current pharmacy: [facility] . Interventions o Comprehensive medication review performed. o Utilize [facility] pharmacy for medication synchronization, packaging and delivery . Patient self care activities - Over the next 90 days, patient will: o Focus on medication adherence by filling and taking medications appropriately  o Take medications as prescribed o Report any questions or concerns to PharmD and/or provider(s)  Please see past updates related to this goal by clicking on the "Past Updates" button in the selected goal         Patient verbalizes understanding of instructions provided today and agrees to view in [place].   Telephone follow up appointment with care management team member scheduled for: 4 weeks  Cherre Robins, PharmD Clinical Pharmacist [facility] [facility]

## 2021-01-29 ENCOUNTER — Encounter: Payer: Self-pay | Admitting: Family Medicine

## 2021-01-29 LAB — IBC PANEL: Iron Saturation: 21

## 2021-01-29 LAB — FERRITIN: Ferritin: 45

## 2021-01-29 LAB — CHG IRON BINDING CAPACITY: Iron Bind.Cap.(Total): 342

## 2021-01-29 LAB — IRON: Iron: 71

## 2021-01-29 LAB — PARATHYROID HORMONE, INTACT (NO CA): PTH: 53

## 2021-02-01 ENCOUNTER — Telehealth: Payer: Self-pay | Admitting: Pharmacist

## 2021-02-01 NOTE — Progress Notes (Addendum)
° ° °  Chronic Care Management Pharmacy Assistant   Name: Abigail [name]  MRN: [id number] DOB: 06-15-1951  Reason for Encounter: Medication Review-Medication Coordination Call   Medications: Outpatient Encounter Medications as of 02/01/2021  Medication Sig Note   Ascorbic Acid (VITAMIN C) 1000 MG tablet Take 1,000 mg by mouth daily.    atorvastatin (LIPITOR) 20 MG tablet TAKE 1 TABLET(20 MG) BY MOUTH DAILY    blood glucose meter kit and supplies Dispense based on patient and insurance preference. Use up to four times daily as directed. (FOR ICD-10 E10.9, E11.9).    Blood Glucose Monitoring Suppl (ONE TOUCH ULTRA 2) w/Device KIT by Does not apply route.    FARXIGA 10 MG TABS tablet Take 10 mg by mouth daily.    FLUoxetine (PROZAC) 20 MG capsule Take 1 capsule (20 mg total) by mouth daily.    glucose blood test strip 1 each by Other route as needed for other. Use as instructed    losartan (COZAAR) 25 MG tablet TAKE 1 TABLET(25 MG) BY MOUTH DAILY    Multiple Vitamins-Minerals (ALIVE WOMENS GUMMY) CHEW Chew 2 tablets by mouth daily.    nitroGLYCERIN (NITROSTAT) 0.4 MG SL tablet PLACE 1 TABLET UNDER THE TONGUE EVERY 5 MINUTES AS NEEDED FOR CHEST PAIN 06/12/2020: Has not had to use, but has it just in case   omeprazole (PRILOSEC) 20 MG capsule TAKE 1 CAPSULE(20 MG) BY MOUTH DAILY    OneTouch Delica Lancets 41U MISC Use to check glucose up to 4 times daily. Dx Code E11.9    propranolol (INDERAL) 10 MG tablet TAKE 1 TABLET BY MOUTH EVERY 8-12 HOURS AS NEEDED FOR TREMOR    Semaglutide, 1 MG/DOSE, (OZEMPIC, 1 MG/DOSE,) 2 MG/1.5ML SOPN Inject 1 mg into the skin once a week. Getting through PAP    traZODone (DESYREL) 150 MG tablet Take 1 tablet (150 mg total) by mouth at bedtime.    zinc gluconate 50 MG tablet Take 50 mg by mouth daily.    No facility-administered encounter medications on file as of 02/01/2021.    Reviewed chart for medication changes ahead of medication coordination call.  No OVs,  Consults, or hospital visits since last Pharmacist visit With [place].  No medication changes indicated.  BP Readings from Last 3 Encounters:  01/01/21 120/72  12/23/20 120/76  12/18/20 (!) 145/78    Lab Results  Component Value Date   HGBA1C 7.4 12/21/2020     Patient obtains medications through Vials  90 Days   Last adherence delivery included:  Losartan 25 mg  Trazodone 150 mg  Atorvastatin 20 mg  Patient declined meds last month:(enough on hand) Propranolol 10 mg  Fluoxetine 20 mg  Patient is due for next adherence delivery on: 01/24/21.  Called patient and reviewed medications and coordinated delivery.  This delivery to include: Propranolol 10 mg   Patient declined the following medications:  Losartan 25 mg  Trazodone 150 mg  Atorvastatin 20 mg  Patient does not need refills at this time.  Confirmed delivery date of 02/03/21, advised patient that pharmacy will contact them the morning of delivery.  Follow-Up-Pharmacist Review  Charlann Lange, RMA Clinical Pharmacist Assistant [phone]  10 minutes spent in review, coordination, and documentation.  Reviewed by: Beverly Milch, PharmD Clinical Pharmacist [facility] [phone]

## 2021-03-01 ENCOUNTER — Ambulatory Visit (INDEPENDENT_AMBULATORY_CARE_PROVIDER_SITE_OTHER): Payer: PPO | Admitting: Pharmacist

## 2021-03-01 DIAGNOSIS — E785 Hyperlipidemia, unspecified: Secondary | ICD-10-CM

## 2021-03-01 DIAGNOSIS — I10 Essential (primary) hypertension: Secondary | ICD-10-CM

## 2021-03-01 DIAGNOSIS — E119 Type 2 diabetes mellitus without complications: Secondary | ICD-10-CM

## 2021-03-01 DIAGNOSIS — E1169 Type 2 diabetes mellitus with other specified complication: Secondary | ICD-10-CM | POA: Diagnosis not present

## 2021-03-01 NOTE — Patient Instructions (Signed)
Visit Information  PATIENT GOALS: Goals Addressed            This Visit's Progress   . Chronic Care Management Pharmacy Care Plan       CARE PLAN ENTRY (see longitudinal plan of care for additional care plan information)  Current Barriers:  . Chronic Disease Management support, education, and care coordination needs related to Diabetes, Hypertension, Hyperlipidemia, Chest Pain, Anxiety, Tremor, Insomnia, GERD   Hypertension BP Readings from Last 3 Encounters:  01/01/21 120/72  12/23/20 120/76  12/18/20 (!) 145/78   . Pharmacist Clinical Goal(s): o Over the next 90 days, patient will work with PharmD and providers to maintain BP goal <140/90 . Current regimen:  o Losartan 25mg  daily . Patient self care activities - Over the next 90 days, patient will: o Maintain hypertension medication regimen.  Hyperlipidemia Lab Results  Component Value Date/Time   LDLCALC 76 02/24/2020 03:52 PM   . Pharmacist Clinical Goal(s): o Over the next 90 days, patient will work with PharmD and providers to maintain LDL goal < 100 . Current regimen:  o Atorvastatin 20mg  daily . Patient self care activities - Over the next 90 days, patient will: o Maintain cholesterol medication regimen.  Diabetes Lab Results  Component Value Date/Time   HGBA1C 7.4 12/21/2020 12:00 AM   HGBA1C 7.9 (H) 10/05/2020 09:33 AM   HGBA1C 7.9 (H) 02/24/2020 03:52 PM   . Pharmacist Clinical Goal(s): o Over the next 90 days, patient will work with PharmD and providers to achieve A1c goal <7% . Current regimen:  o Ozempic 1mg  daily o Farxiga 10mg  daily (added by Dr Moshe Cipro for blood glucose and kidney function) . Interventions: o Reviewed, completed and faxed applications for Farxiga andd Ozempic to assistance program today o Requested last A1c result from Dr Shelva Majestic office (done Feb 2022) o Checking with Dr Lorelei Pont regarding next follow up appointment . Patient self care activities - Over the next 90  days, patient will: o Check blood sugar twice daily, document, and provide at future appointments o Contact provider with any episodes of hypoglycemia o Notify office when you receive letter of from patient assistance program or receive medication  Medication management . Pharmacist Clinical Goal(s): o Over the next 90 days, patient will work with PharmD and providers to achieve optimal medication adherence . Current pharmacy: [facility] . Interventions o Comprehensive medication review performed. o Utilize [facility] pharmacy for medication synchronization, packaging and delivery . Patient self care activities - Over the next 90 days, patient will: o Focus on medication adherence by filling and taking medications appropriately  o Take medications as prescribed o Report any questions or concerns to PharmD and/or provider(s)  Please see past updates related to this goal by clicking on the "Past Updates" button in the selected goal         encounter for patient assistance   Telephone follow up appointment with care management team member scheduled for: 1 month for check on patient assistance applications and 3 months with clinical pharmacist  Cherre Robins, PharmD Clinical Pharmacist [facility] [phone]

## 2021-03-01 NOTE — Chronic Care Management (AMB) (Signed)
Chronic Care Management Pharmacy Note  03/01/2021 Name:  Abigail [name] MRN:  [id number] DOB:  10/12/1951  Subjective: Abigail [name] is an 70 y.o. year old female who is a primary patient of Copland, Gay Filler, MD.  The CCM team was consulted for assistance with disease management and care coordination needs.    Collaboration with provider, patient and manufacturer assistance program for completion of patient assistance applicaiton in response to provider referral for pharmacy case management and/or care coordination services.   Consent to Services:  The patient was given information about Chronic Care Management services, agreed to services, and gave verbal consent prior to initiation of services.  Please see initial visit note for detailed documentation.   Patient Care Team: Copland, Gay Filler, MD as PCP - General (Family Medicine) Cherre Robins, PharmD (Pharmacist)  Recent office visits: 12/23/2020 - PCP (Dr Lorelei Pont): F/U from recent ER visit on 2/11 with chest pain. She was 2 weeks post COVID infection at presentation to hospital. Dr Lorelei Pont suspected her chest discomfort is residual from recent illness. Prescribed prednisone for noted chest congestion. Continue with plan to f/u with cardiology next week for further evaluation/EKG.   Recent consult visits: 01/01/2021 - Cardio (Dr Ellyn Hack): precordial chest pain; EKG showed complete left bundle branch block. No medication changes. Cardio f/u 4 months  Hospital visits: None since last CCM follow up on 01/25/2021  Objective:  Lab Results  Component Value Date   CREATININE 1.3 (A) 12/21/2020   CREATININE 1.39 (H) 12/18/2020   CREATININE 1.47 (H) 10/05/2020    Lab Results  Component Value Date   HGBA1C 7.4 12/21/2020   Last diabetic Eye exam:  Lab Results  Component Value Date/Time   HMDIABEYEEXA No Retinopathy 08/17/2020 12:00 AM    Last diabetic Foot exam: No results found for: HMDIABFOOTEX      Component  Value Date/Time   CHOL 173 02/24/2020 1552   TRIG 199.0 (H) 02/24/2020 1552   HDL 56.60 02/24/2020 1552   CHOLHDL 3 02/24/2020 1552   VLDL 39.8 02/24/2020 1552   LDLCALC 76 02/24/2020 1552    Hepatic Function Latest Ref Rng & Units 12/21/2020 12/18/2020 10/05/2020  Total Protein 6.5 - 8.1 g/dL - 6.6 6.7  Albumin 3.5 - 5.0 4.3 3.6 4.2  AST 15 - 41 U/L - 20 16  ALT 0 - 44 U/L - 17 13  Alk Phosphatase 38 - 126 U/L - 70 85  Total Bilirubin 0.3 - 1.2 mg/dL - 0.2(L) 0.4    Lab Results  Component Value Date/Time   TSH 2.16 02/24/2020 03:52 PM   TSH 1.45 01/02/2017 11:21 AM   FREET4 0.91 03/17/2016 11:07 AM    CBC Latest Ref Rng & Units 12/21/2020 12/18/2020 10/05/2020  WBC 4.0 - 10.5 K/uL - 6.1 6.7  Hemoglobin 12.0 - 16.0 12.1 12.1 13.1  Hematocrit 36.0 - 46.0 % - 37.4 38.9  Platelets 150 - 400 K/uL - 175 205.0    Lab Results  Component Value Date/Time   VD25OH 37.36 02/24/2020 03:52 PM    Clinical ASCVD: No  The 10-year ASCVD risk score Mikey Bussing DC Jr., et al., 2013) is: 21.7%   Values used to calculate the score:     Age: 49 years     Sex: Female     Is Non-Hispanic African American: No     Diabetic: Yes     Tobacco smoker: No     Systolic Blood Pressure: 826 mmHg     Is BP treated: Yes  HDL Cholesterol: 56.6 mg/dL     Total Cholesterol: 173 mg/dL     Social History   Tobacco Use  Smoking Status Never Smoker  Smokeless Tobacco Never Used   BP Readings from Last 3 Encounters:  01/01/21 120/72  12/23/20 120/76  12/18/20 (!) 145/78   Pulse Readings from Last 3 Encounters:  01/01/21 75  12/23/20 97  12/18/20 78   Wt Readings from Last 3 Encounters:  01/01/21 146 lb 12.8 oz (66.6 kg)  12/23/20 149 lb (67.6 kg)  12/18/20 147 lb (66.7 kg)    Assessment: Review of patient past medical history, allergies, medications, health status, including review of consultants reports, laboratory and other test data, was performed as part of comprehensive evaluation and  provision of chronic care management services.   SDOH:  (Social Determinants of Health) assessments and interventions performed:  SDOH Interventions   Flowsheet Row Most Recent Value  SDOH Interventions   Financial Strain Interventions Other (Comment)  [Assisting patient with applying for manufacturer assistance programs for brand medications - Farxiga and Ozempic]      CCM Care Plan  Allergies  Allergen Reactions  . Pneumovax 23 [Pneumococcal Vac Polyvalent] Other (See Comments)    Pt had severe arm pain  . Amaryl [Glimepiride] Nausea Only  . Cephalexin Hives  . Sulfa Antibiotics Rash    Medications Reviewed Today    Reviewed by Cherre Robins, PharmD (Pharmacist) on 03/01/21 at [phone]  Med List Status: <None>  Medication Order Taking? Sig Documenting Provider Last Dose Status Informant  Ascorbic Acid (VITAMIN C) 1000 MG tablet [id number] Yes Take 1,000 mg by mouth daily. [provider] Taking Active   atorvastatin (LIPITOR) 20 MG tablet [id number] Yes TAKE 1 TABLET(20 MG) BY MOUTH DAILY Copland, Gay Filler, MD Taking Active   blood glucose meter kit and supplies [id number] Yes Dispense based on patient and insurance preference. Use up to four times daily as directed. (FOR ICD-10 E10.9, E11.9). Copland, Gay Filler, MD Taking Active   Blood Glucose Monitoring Suppl (ONE TOUCH ULTRA 2) w/Device KIT [id number] Yes by Does not apply route. [provider] Taking Active   FARXIGA 10 MG TABS tablet [id number] Yes Take 10 mg by mouth daily. [provider] Taking Active   FLUoxetine (PROZAC) 20 MG capsule [id number] Yes Take 1 capsule (20 mg total) by mouth daily. Copland, Gay Filler, MD Taking Active   glucose blood test strip [id number] Yes 1 each by Other route as needed for other. Use as instructed [provider] Taking Active   losartan (COZAAR) 25 MG tablet [id number] Yes TAKE 1 TABLET(25 MG) BY MOUTH DAILY Copland, Gay Filler, MD Taking Active   Multiple  Vitamins-Minerals (ALIVE WOMENS GUMMY) CHEW [id number] Yes Chew 2 tablets by mouth daily. [provider] Taking Active Self  nitroGLYCERIN (NITROSTAT) 0.4 MG SL tablet [id number] Yes PLACE 1 TABLET UNDER THE TONGUE EVERY 5 MINUTES AS NEEDED FOR CHEST PAIN Copland, Gay Filler, MD Taking Active            Med Note [place]   Fri Jun 12, 2020  4:22 PM) Has not had to use, but has it just in case  omeprazole (PRILOSEC) 20 MG capsule [id number] Yes TAKE 1 CAPSULE(20 MG) BY MOUTH DAILY Copland, Gay Filler, MD Taking Active   OneTouch Delica Lancets 60O MISC [id number] Yes Use to check glucose up to 4 times daily. Dx Code E11.9 Copland, Gay Filler, MD Taking Active   propranolol (INDERAL) 10 MG tablet [id number] Yes  TAKE 1 TABLET BY MOUTH EVERY 8-12 HOURS AS NEEDED FOR TREMOR Copland, Gay Filler, MD Taking Active   Semaglutide, 1 MG/DOSE, (OZEMPIC, 1 MG/DOSE,) 2 MG/1.5ML SOPN [id number] Yes Inject 1 mg into the skin once a week. Getting through PAP [provider] Taking Active   traZODone (DESYREL) 150 MG tablet [id number] Yes Take 1 tablet (150 mg total) by mouth at bedtime. Copland, Gay Filler, MD Taking Active   zinc gluconate 50 MG tablet [id number] Yes Take 50 mg by mouth daily. [provider] Taking Active           Patient Active Problem List   Diagnosis Date Noted  . 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/29/2020  . Essential hypertension   . Gastroesophageal reflux disease without esophagitis   . Microscopic hematuria 09/16/2015  . Trimalleolar fracture of ankle, closed 11/17/2014  . Hyperlipidemia associated with type 2 diabetes mellitus ([employer]) 07/24/2013  . Chest pain 10/23/2012  . Type 2 diabetes mellitus without complication, without long-term current use of insulin ([place]) 10/08/2012  . Anxiety 10/08/2012    Immunization History  Administered Date(s) Administered  . Fluad Quad(high Dose 65+) 07/01/2019  . Influenza Whole 08/08/2013  . Influenza, High Dose Seasonal PF 08/03/2016,  08/17/2017  . Influenza, Seasonal, Injecte, Preservative Fre 10/08/2012  . Influenza,inj,Quad PF,6+ Mos 08/05/2015  . Influenza-Unspecified 08/08/2014, 08/13/2020  . PFIZER(Purple Top)SARS-COV-2 Vaccination 01/13/2020, 02/12/2020, 08/13/2020  . Pneumococcal Conjugate-13 11/03/2014  . Pneumococcal Polysaccharide-23 02/10/2016  . Zoster Recombinat (Shingrix) 09/13/2018    Conditions to be addressed/monitored: HTN, HLD, DMII and GERD  There are no care plans that you recently modified to display for this patient.   Medication Assistance: Application for Farxiga and Ozempic  medication assistance program. in process.  Anticipated assistance start date 03/31/2021.  See plan of care for additional detail.  Patient's preferred pharmacy is:  [facility] [place], [place] - [address] [place] [address] Phone: [phone] Fax: [phone]  [facility] - [place], [place] - [address] [place] [address] Phone: [phone] Fax: [phone]  [facility] [place], [address] AT [address] [place] [address] Phone: [phone] Fax: [phone]  [facility] [place] [place], [place] - [address] [place] [address] Phone: [phone] Fax: [phone]  [facility] [place], [place] - [address] [place] [address] Phone: [phone] Fax: [phone]   Follow Up:  Patient requests no follow-up at this time.  Plan: Telephone follow up appointment with care management team member scheduled for:  1 month to check on patient assistance applications; 3 months with clinical pharmacist  Cherre Robins, PharmD Clinical Pharmacist [facility]

## 2021-03-02 ENCOUNTER — Telehealth: Payer: Self-pay | Admitting: Pharmacist

## 2021-03-02 NOTE — Progress Notes (Addendum)
    Chronic Care Management Pharmacy Assistant   Name: Analis Distler  MRN: [id number] DOB: 11-28-1950  Reason for Encounter: Medication Review/Medication Coordination Call.  Medications: Outpatient Encounter Medications as of 03/02/2021  Medication Sig Note   Ascorbic Acid (VITAMIN C) 1000 MG tablet Take 1,000 mg by mouth daily.    atorvastatin (LIPITOR) 20 MG tablet TAKE 1 TABLET(20 MG) BY MOUTH DAILY    blood glucose meter kit and supplies Dispense based on patient and insurance preference. Use up to four times daily as directed. (FOR ICD-10 E10.9, E11.9).    Blood Glucose Monitoring Suppl (ONE TOUCH ULTRA 2) w/Device KIT by Does not apply route.    FARXIGA 10 MG TABS tablet Take 10 mg by mouth daily.    FLUoxetine (PROZAC) 20 MG capsule Take 1 capsule (20 mg total) by mouth daily.    glucose blood test strip 1 each by Other route as needed for other. Use as instructed    losartan (COZAAR) 25 MG tablet TAKE 1 TABLET(25 MG) BY MOUTH DAILY    Multiple Vitamins-Minerals (ALIVE WOMENS GUMMY) CHEW Chew 2 tablets by mouth daily.    nitroGLYCERIN (NITROSTAT) 0.4 MG SL tablet PLACE 1 TABLET UNDER THE TONGUE EVERY 5 MINUTES AS NEEDED FOR CHEST PAIN 06/12/2020: Has not had to use, but has it just in case   omeprazole (PRILOSEC) 20 MG capsule TAKE 1 CAPSULE(20 MG) BY MOUTH DAILY    OneTouch Delica Lancets 12I MISC Use to check glucose up to 4 times daily. Dx Code E11.9    propranolol (INDERAL) 10 MG tablet TAKE 1 TABLET BY MOUTH EVERY 8-12 HOURS AS NEEDED FOR TREMOR    Semaglutide, 1 MG/DOSE, (OZEMPIC, 1 MG/DOSE,) 2 MG/1.5ML SOPN Inject 1 mg into the skin once a week. Getting through PAP    traZODone (DESYREL) 150 MG tablet Take 1 tablet (150 mg total) by mouth at bedtime.    zinc gluconate 50 MG tablet Take 50 mg by mouth daily.    No facility-administered encounter medications on file as of 03/02/2021.    Reviewed chart for medication changes ahead of medication coordination call.  No OVs,  Consults, or hospital visits since last care coordination call/Pharmacist visit.  No medication changes indicated.  BP Readings from Last 3 Encounters:  01/01/21 120/72  12/23/20 120/76  12/18/20 (!) 145/78    Lab Results  Component Value Date   HGBA1C 7.4 12/21/2020     Patient obtains medications through Vials  90 Days   Last adherence delivery included:  Propranolol 10 mg   Patient declined meds last month: Losartan 25 mg  Trazodone 150 mg  Atorvastatin 20 mg  Patient is not due for an adherence delivery at this time.  Called patient and reviewed medications and coordinated delivery.  This delivery to include: None  Patient declined the following medications: (enough on hand) Losartan 25 mg  Trazodone 150 mg  Atorvastatin 20 mg Propranolol 10 mg  Omeprazole 20 mg   Patient does not need refills at this time.  Follow-Up:Pharmacist Review  Charlann Lange, RMA Clinical Pharmacist Assistant [phone]   7 minutes spent in review, coordination, and documentation.  Reviewed by: Beverly Milch, PharmD Clinical Pharmacist [facility] [phone]

## 2021-03-15 ENCOUNTER — Ambulatory Visit (INDEPENDENT_AMBULATORY_CARE_PROVIDER_SITE_OTHER): Payer: PPO | Admitting: Medical

## 2021-03-15 ENCOUNTER — Other Ambulatory Visit: Payer: Self-pay

## 2021-03-15 ENCOUNTER — Encounter: Payer: Self-pay | Admitting: Medical

## 2021-03-15 ENCOUNTER — Other Ambulatory Visit (HOSPITAL_BASED_OUTPATIENT_CLINIC_OR_DEPARTMENT_OTHER): Payer: Self-pay

## 2021-03-15 VITALS — BP 132/75 | HR 100 | Temp 98.2°F | Ht 65.0 in

## 2021-03-15 DIAGNOSIS — J029 Acute pharyngitis, unspecified: Secondary | ICD-10-CM

## 2021-03-15 DIAGNOSIS — R059 Cough, unspecified: Secondary | ICD-10-CM

## 2021-03-15 DIAGNOSIS — J40 Bronchitis, not specified as acute or chronic: Secondary | ICD-10-CM

## 2021-03-15 MED ORDER — BENZONATATE 100 MG PO CAPS
100.0000 mg | ORAL_CAPSULE | Freq: Three times a day (TID) | ORAL | 0 refills | Status: DC | PRN
  Filled 2021-03-15: qty 30, 10d supply, fill #0

## 2021-03-15 MED ORDER — FLUTICASONE PROPIONATE 50 MCG/ACT NA SUSP
2.0000 | Freq: Every day | NASAL | 1 refills | Status: AC
  Filled 2021-03-15: qty 16, 30d supply, fill #0

## 2021-03-15 MED ORDER — AZITHROMYCIN 250 MG PO TABS
ORAL_TABLET | ORAL | 0 refills | Status: AC
  Filled 2021-03-15: qty 6, 5d supply, fill #0

## 2021-03-15 NOTE — Patient Instructions (Addendum)
You appear to have bronchitis and sinusitis signs and symptoms(mild st as well). Rest hydrate and tylenol for fever. I am prescribing cough medicine benzonatate  and azithromycin  antibiotic. For your nasal congestion rx flonase.   You should gradually get better. If not then notify us and would recommend a chest xray. Order placed.  Covid test negative. Doubt covid but if symptoms persist then could repeat covid test.  Follow up in 7-10 days or as needed

## 2021-03-15 NOTE — Progress Notes (Addendum)
Subjective:    Patient ID: Abigail [name], female    DOB: 1951-08-13, 70 y.o.   MRN: [id number]  HPI  Pt in with coughing, chest congestion, mild st and mild nasal congestion.  States at night cough up globs of mucus.   Pt states cough keeping her up at night.  No wheezing or shortness of breath.  Pt had negative covid test on Wednesday last week.  Pt has no muscle aches.  Pt had 3 covid vaccines and covid back in January. Mild- moderate.   Pt states having some nasal congestion after possible exposure.    Review of Systems  Constitutional: Negative for chills, fatigue and fever.  Respiratory: Negative for cough, chest tightness, shortness of breath and wheezing.   Cardiovascular: Negative for chest pain and palpitations.  Gastrointestinal: Negative for abdominal pain and anal bleeding.  Genitourinary: Negative for dysuria.  Musculoskeletal: Negative for back pain and joint swelling.  Neurological: Negative for dizziness and headaches.  Hematological: Negative for adenopathy.  Psychiatric/Behavioral: Negative for confusion. The patient is not nervous/anxious.     Past Medical History:  Diagnosis Date  . Anemia, unspecified   . Anxiety   . CKD (chronic kidney disease), stage III ([place])   . Depression   . Diabetes mellitus type II, non insulin dependent ([place])    On Ozempic and [place]  . GERD (gastroesophageal reflux disease)   . Hematuria   . Hyperlipidemia   . Hypertension   . Mild mitral regurgitation 08/2017   Essentially normal-mild MR on echo.  . Normal coronary arteries    Coronary CTA: No evidence of CAD.  Coronary calcium score 0.  . 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     Social History   Socioeconomic History  . Marital status: Widowed    Spouse name: Not on file  . Number of children: 2  . Years of education: Not on file  . Highest education level: High school graduate  Occupational History  . Occupation: Armed forces technical officer    Comment: Retired-Worked at [employer]  . Smoking status: Never Smoker  . Smokeless tobacco: Never Used  Vaping Use  . Vaping Use: Never used  Substance and Sexual Activity  . Alcohol use: Yes    Alcohol/week: 1.0 standard drink    Types: 1 Glasses of wine per week  . Drug use: No  . Sexual activity: Yes    Birth control/protection: None  Other Topics Concern  . Not on file  Social History Narrative   Marital status: married x 1970. ->  Now widowed      Children:  2 children; 7 grandchildren.      Employment:  [employer]. ->  Retired      Tobacco: none       Alcohol: sporadic       Exercises: walking 7 days per week.  20-30 minutes.  Walks her dog.   Social Determinants of Health   Financial Resource Strain: Medium Risk  . Difficulty of Paying Living Expenses: Somewhat hard  Food Insecurity: No Food Insecurity  . Worried About Charity fundraiser in the Last Year: Never true  . Ran Out of Food in the Last Year: Never true  Transportation Needs: No Transportation Needs  . Lack of Transportation (Medical): No  . Lack of Transportation (Non-Medical): No  Physical Activity: Not on file  Stress: Not on file  Social Connections: Not on file  Intimate Partner Violence:  Not on file    Past Surgical History:  Procedure Laterality Date  . CHOLECYSTECTOMY N/A 06/03/2015   Procedure: LAPAROSCOPIC CHOLECYSTECTOMY WITH INTRAOPERATIVE CHOLANGIOGRAM;  Surgeon: Donnie Mesa, MD;  Location: [facility];  Service: General;  Laterality: N/A;  . COLONOSCOPY    . HAMMER TOE SURGERY    . ORIF ANKLE FRACTURE Left 11/17/2014   Procedure: OPEN REDUCTION INTERNAL FIXATION (ORIF) LEFT TRIMALLEOLAR ANKLE FRACTURE;  Surgeon: Marianna Payment, MD;  Location: [facility];  Service: Orthopedics;  Laterality: Left;  . OVARIAN CYST REMOVAL      Family History  Problem Relation Age of Onset  . Diabetes Mother   . Hyperlipidemia Mother   . Diabetes Father    . Heart disease Father 53       AMI x 2; first AMI age 58  . Stroke Father   . Hypertension Father   . Hyperlipidemia Brother   . AAA (abdominal aortic aneurysm) Brother   . Congestive Heart Failure Brother        s/p ICD  . Coronary artery disease Brother        defibrillator; CHF  . Kidney cancer Brother        Status post nephrectomy  . Other Sister 20       Died in a car accident    Allergies  Allergen Reactions  . Pneumovax 23 [Pneumococcal Vac Polyvalent] Other (See Comments)    Pt had severe arm pain  . Amaryl [Glimepiride] Nausea Only  . Cephalexin Hives  . Sulfa Antibiotics Rash    Current Outpatient Medications on File Prior to Visit  Medication Sig Dispense Refill  . Ascorbic Acid (VITAMIN C) 1000 MG tablet Take 1,000 mg by mouth daily.    [name] atorvastatin (LIPITOR) 20 MG tablet TAKE 1 TABLET(20 MG) BY MOUTH DAILY 90 tablet 3  . blood glucose meter kit and supplies Dispense based on patient and insurance preference. Use up to four times daily as directed. (FOR ICD-10 E10.9, E11.9). 1 each 0  . Blood Glucose Monitoring Suppl (ONE TOUCH ULTRA 2) w/Device KIT by Does not apply route.    [name] FARXIGA 10 MG TABS tablet Take 10 mg by mouth daily.    [name] FLUoxetine (PROZAC) 20 MG capsule Take 1 capsule (20 mg total) by mouth daily. 90 capsule 3  . glucose blood test strip 1 each by Other route as needed for other. Use as instructed    . losartan (COZAAR) 25 MG tablet TAKE 1 TABLET(25 MG) BY MOUTH DAILY 90 tablet 3  . Multiple Vitamins-Minerals (ALIVE WOMENS GUMMY) CHEW Chew 2 tablets by mouth daily.    . nitroGLYCERIN (NITROSTAT) 0.4 MG SL tablet PLACE 1 TABLET UNDER THE TONGUE EVERY 5 MINUTES AS NEEDED FOR CHEST PAIN 25 tablet 2  . omeprazole (PRILOSEC) 20 MG capsule TAKE 1 CAPSULE(20 MG) BY MOUTH DAILY 90 capsule 3  . OneTouch Delica Lancets 95J MISC Use to check glucose up to 4 times daily. Dx Code E11.9 400 each 1  . propranolol (INDERAL) 10 MG tablet TAKE 1 TABLET BY MOUTH  EVERY 8-12 HOURS AS NEEDED FOR TREMOR 270 tablet 0  . Semaglutide, 1 MG/DOSE, (OZEMPIC, 1 MG/DOSE,) 2 MG/1.5ML SOPN Inject 1 mg into the skin once a week. Getting through PAP    . traZODone (DESYREL) 150 MG tablet Take 1 tablet (150 mg total) by mouth at bedtime. 90 tablet 3  . zinc gluconate 50 MG tablet Take 50 mg by mouth daily.  No current facility-administered medications on file prior to visit.    BP 132/75   Pulse 100   Temp 98.2 F (36.8 C)   Ht _0  (1.651 m)   SpO2 98%   BMI 24.43 kg/m       Objective:   Physical Exam  General  Mental Status - Alert. General Appearance - Well groomed. Not in acute distress.  Skin Rashes- No Rashes.  HEENT Head- Normal. Ear Auditory Canal - Left- Normal. Right - Normal.Tympanic Membrane- Left- Normal. Right- Normal. Eye Sclera/Conjunctiva- Left- Normal. Right- Normal. Nose & Sinuses Nasal Mucosa- Left-  Boggy and Congested. Right-  Boggy and  Congested.Bilateral mild  maxillary and frontal sinus pressure. Mouth & Throat Lips: Upper Lip- Normal: no dryness, cracking, pallor, cyanosis, or vesicular eruption. Lower Lip-Normal: no dryness, cracking, pallor, cyanosis or vesicular eruption. Buccal Mucosa- Bilateral- No Aphthous ulcers. Oropharynx- No Discharge or Erythema. Tonsils: Characteristics- Bilateral- mild pink red pharynx. Size/Enlargement- Bilateral- No enlargement. Discharge- bilateral-None.  Neck Neck- Supple. No Masses.   Chest and Lung Exam Auscultation: Breath Sounds:-Clear even and unlabored.  Cardiovascular Auscultation:Rythm- Regular, rate and rhythm. Murmurs & Other Heart Sounds:Ausculatation of the heart reveal- No Murmurs.  Lymphatic Head & Neck General Head & Neck Lymphatics: Bilateral: Description- No Localized lymphadenopathy.       Assessment & Plan:   You appear to have bronchitis and sinusitis signs and symptoms(mild st). Rest hydrate and tylenol for fever. I am prescribingcough medicine  benzonatate , and azithromycin  antibiotic. For your nasal congestion rx flonase.   You should gradually get better. If not then notify us and would recommend a chest xray. Order placed.  Covid test negative. Doubt covid but if symptoms persist then could repeat covid test.  Follow up in 7-10 days or as needed

## 2021-03-31 DIAGNOSIS — E1122 Type 2 diabetes mellitus with diabetic chronic kidney disease: Secondary | ICD-10-CM | POA: Diagnosis not present

## 2021-03-31 DIAGNOSIS — I129 Hypertensive chronic kidney disease with stage 1 through stage 4 chronic kidney disease, or unspecified chronic kidney disease: Secondary | ICD-10-CM | POA: Diagnosis not present

## 2021-03-31 DIAGNOSIS — N1832 Chronic kidney disease, stage 3b: Secondary | ICD-10-CM | POA: Diagnosis not present

## 2021-03-31 LAB — BASIC METABOLIC PANEL
BUN: 28 — AB (ref 4–21)
CO2: 28 — AB (ref 13–22)
Chloride: 99 (ref 99–108)
Creatinine: 1.6 — AB (ref 0.5–1.1)
Glucose: 201
Potassium: 4.2 (ref 3.4–5.3)
Sodium: 139 (ref 137–147)

## 2021-03-31 LAB — HEMOGLOBIN A1C: Hemoglobin A1C: 7.6

## 2021-03-31 LAB — IRON,TIBC AND FERRITIN PANEL
%SAT: 33
Ferritin: 50
Iron: 121
TIBC: 366

## 2021-03-31 LAB — COMPREHENSIVE METABOLIC PANEL
Albumin: 4.4 (ref 3.5–5.0)
Calcium: 9.3 (ref 8.7–10.7)
GFR calc non Af Amer: 35

## 2021-03-31 LAB — CBC AND DIFFERENTIAL: Hemoglobin: 12.9 (ref 12.0–16.0)

## 2021-04-05 NOTE — Patient Instructions (Addendum)
Good to see you again today, as always!  We will try increasing your Ozempic to 2mg  weekly for better glucose control  Please call [facility] and check on the price of your Ozempic- let me know if this will not work for you $ wise  [facility] - [place], [place] - [address] [facility] [address]  [address], [place] [address]  Phone:  [phone]  Please stop and get an x-ray of your neck on the ground floor-  I will be in touch with your reports asap  You might also try a massage or chiropractor for your neck

## 2021-04-05 NOTE — Progress Notes (Signed)
[facility] [address], [place], [place] [address] [phone]  Date:  04/08/2021   Name:  Abigail [name]   DOB:  Jul 20, 1951   MRN:  [id number]  PCP:  Darreld Mclean, MD    Chief Complaint: Headache (Headache, neck pain several months, right side pain, no other symptoms of stroke)   History of Present Illness:  Abigail [name] is a 70 y.o. very pleasant female patient who presents with the following:  Abigail [name] is here today with concern of a headache and neck pain Most recent visit with myself in February when she was ill  History of controlled diabetes, hypertension, chronic renal insufficiency-she is seeing nephrology, hyperlipidemia Most recent nephrology visit was in the last 10 days, reviewed note We did some lab work in February  Today she notes right sided head and neck pain for 3-4 months She tried a different pillow but not better If she turns her head to the right she may get a sharp pain also on the right side Last week she had left sided pain  She is not aware of any injury No numbnes or weakness in her arms  No pain down her arms No acute vision change, she does feel like she needs her glassses more recently but this is not acute  Also, her BG has been quite variable- may go up into the mid 250s which is very concerning to Abigail [name] She saw Dr Clover Mealy- she took her off losartan as her BP was low  She is using farxiga now She is also on ozempic but ran out last week - needs a refill  Currently taking 1 mg  She reports that her nephrologist checked her A1c last week - found note, A1c was 7.6% on 03/31/21 BMP was also checked at that time- creat 1.56 BP Readings from Last 3 Encounters:  04/08/21 118/72  03/15/21 132/75  01/01/21 120/72   Wt Readings from Last 3 Encounters:  04/08/21 141 lb (64 kg)  01/01/21 146 lb 12.8 oz (66.6 kg)  12/23/20 149 lb (67.6 kg)   Lab Results  Component  Value Date   HGBA1C 7.4 12/21/2020   Patient Active Problem List   Diagnosis Date Noted  . 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/29/2020  . Essential hypertension   . Gastroesophageal reflux disease without esophagitis   . Microscopic hematuria 09/16/2015  . Trimalleolar fracture of ankle, closed 11/17/2014  . Hyperlipidemia associated with type 2 diabetes mellitus ([place]) 07/24/2013  . Chest pain 10/23/2012  . Type 2 diabetes mellitus without complication, without long-term current use of insulin ([facility] [place]) 10/08/2012  . Anxiety 10/08/2012    Past Medical History:  Diagnosis Date  . Anemia, unspecified   . Anxiety   . CKD (chronic kidney disease), stage III ([place])   . Depression   . Diabetes mellitus type II, non insulin dependent ([place])    On Ozempic and [place]  . GERD (gastroesophageal reflux disease)   . Hematuria   . Hyperlipidemia   . Hypertension   . Mild mitral regurgitation 08/2017   Essentially normal-mild MR on echo.  . Normal coronary arteries    Coronary CTA: No evidence of CAD.  Coronary calcium score 0.  . 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    Past Surgical History:  Procedure Laterality Date  . CHOLECYSTECTOMY N/A 06/03/2015   Procedure: LAPAROSCOPIC CHOLECYSTECTOMY WITH INTRAOPERATIVE CHOLANGIOGRAM;  Surgeon: Donnie Mesa, MD;  Location: [facility];  Service: General;  Laterality: N/A;  . COLONOSCOPY    . HAMMER TOE SURGERY    . ORIF ANKLE FRACTURE Left 11/17/2014   Procedure: OPEN REDUCTION INTERNAL FIXATION (ORIF) LEFT TRIMALLEOLAR ANKLE FRACTURE;  Surgeon: Marianna Payment, MD;  Location: [facility];  Service: Orthopedics;  Laterality: Left;  . OVARIAN CYST REMOVAL      Social History   Tobacco Use  . Smoking status: Never Smoker  . Smokeless tobacco: Never Used  Vaping Use  . Vaping Use: Never used  Substance Use Topics  . Alcohol use: Yes    Alcohol/week: 1.0 standard drink    Types: 1 Glasses of wine per week  . Drug use: No    Family History  Problem  Relation Age of Onset  . Diabetes Mother   . Hyperlipidemia Mother   . Diabetes Father   . Heart disease Father 77       AMI x 2; first AMI age 24  . Stroke Father   . Hypertension Father   . Hyperlipidemia Brother   . AAA (abdominal aortic aneurysm) Brother   . Congestive Heart Failure Brother        s/p ICD  . Coronary artery disease Brother        defibrillator; CHF  . Kidney cancer Brother        Status post nephrectomy  . Other Sister 80       Died in a car accident    Allergies  Allergen Reactions  . Pneumovax 23 [Pneumococcal Vac Polyvalent] Other (See Comments)    Pt had severe arm pain  . Amaryl [Glimepiride] Nausea Only  . Cephalexin Hives  . Sulfa Antibiotics Rash    Medication list has been reviewed and updated.  Current Outpatient Medications on File Prior to Visit  Medication Sig Dispense Refill  . Ascorbic Acid (VITAMIN C) 1000 MG tablet Take 1,000 mg by mouth daily.    [name] atorvastatin (LIPITOR) 20 MG tablet TAKE 1 TABLET(20 MG) BY MOUTH DAILY 90 tablet 3  . blood glucose meter kit and supplies Dispense based on patient and insurance preference. Use up to four times daily as directed. (FOR ICD-10 E10.9, E11.9). 1 each 0  . Blood Glucose Monitoring Suppl (ONE TOUCH ULTRA 2) w/Device KIT by Does not apply route.    [name] FARXIGA 10 MG TABS tablet Take 10 mg by mouth daily.    [name] FLUoxetine (PROZAC) 20 MG capsule Take 1 capsule (20 mg total) by mouth daily. 90 capsule 3  . fluticasone (FLONASE) 50 MCG/ACT nasal spray Place 2 sprays into both nostrils daily. 16 g 1  . glucose blood test strip 1 each by Other route as needed for other. Use as instructed    . losartan (COZAAR) 25 MG tablet TAKE 1 TABLET(25 MG) BY MOUTH DAILY 90 tablet 3  . Multiple Vitamins-Minerals (ALIVE WOMENS GUMMY) CHEW Chew 2 tablets by mouth daily.    . nitroGLYCERIN (NITROSTAT) 0.4 MG SL tablet PLACE 1 TABLET UNDER THE TONGUE EVERY 5 MINUTES AS NEEDED FOR CHEST PAIN 25 tablet 2  . omeprazole  (PRILOSEC) 20 MG capsule TAKE ONE CAPSULE BY MOUTH ONCE DAILY 90 capsule 3  . OneTouch Delica Lancets 83G MISC Use to check glucose up to 4 times daily. Dx Code E11.9 400 each 1  . propranolol (INDERAL) 10 MG tablet TAKE 1 TABLET BY MOUTH EVERY 8-12 HOURS AS NEEDED FOR TREMOR 270 tablet 0  . traZODone (DESYREL) 150 MG tablet Take 1 tablet (150  mg total) by mouth at bedtime. 90 tablet 3  . zinc gluconate 50 MG tablet Take 50 mg by mouth daily.    . Semaglutide, 1 MG/DOSE, (OZEMPIC, 1 MG/DOSE,) 2 MG/1.5ML SOPN Inject 1 mg into the skin once a week. Getting through PAP (Patient not taking: Reported on 04/08/2021)     No current facility-administered medications on file prior to visit.    Review of Systems:  As per HPI- otherwise negative.   Physical Examination: Vitals:   04/08/21 0944  BP: 118/72  Pulse: 82  Resp: 16  Temp: 97.6 F (36.4 C)  SpO2: 97%   Vitals:   04/08/21 0944  Weight: 141 lb (64 kg)  Height: 5' 5"  (1.651 m)   Body mass index is 23.46 kg/m. Ideal Body Weight: Weight in (lb) to have BMI = 25: 149.9  GEN: no acute distress.  Normal weight, looks well HEENT: Atraumatic, Normocephalic.   Bilateral TM wnl, oropharynx normal.  PEERL,EOMI. no tenderness or nodularity over the temporal arteries Ears and Nose: No external deformity. CV: RRR, No M/G/R. No JVD. No thrill. No extra heart sounds. PULM: CTA B, no wheezes, crackles, rhonchi. No retractions. No resp. distress. No accessory muscle use. ABD: S, NT, ND EXTR: No c/c/e PSYCH: Normally interactive. Conversant.  Normal cervical range of motion, perhaps slightly decreased to lateral rotation bilaterally.  Normal strength, sensation and DTR both upper extremities She has tenderness in the cervical paraspinal muscles, more so on the right Assessment and Plan: Neck pain on right side - Plan: DG Cervical Spine Complete, methocarbamol (ROBAXIN) 500 MG tablet  Type 2 diabetes mellitus without complication, without  long-term current use of insulin (HCC) - Plan: Semaglutide, 2 MG/DOSE, (OZEMPIC, 2 MG/DOSE,) 8 MG/3ML SOPN  Essential hypertension  Chronic nonintractable headache, unspecified headache type - Plan: CANCELED: Sedimentation rate, CANCELED: C-reactive protein  Abigail [name] is here today with a couple of concerns.  She is having neck pain, present for a few months and worse on the right side.  On exam this seems most consistent with musculoskeletal pain.  We will obtain plain films of her neck, also Robaxin to use as needed for muscle pain.  She has seen a chiropractor in the past with success, suggested that she might try this again  Concerned about blood glucose still being elevated, her A1c is slightly above goal.  We will increase Ozempic dose to 2 mg  Blood pressure under good control  I discussed low possibility of temporal arteritis.  On physical exam this does not seem likely, but I offered to do a sed rate and CRP.  She declines at this time  This visit occurred during the SARS-CoV-2 public health emergency.  Safety protocols were in place, including screening questions prior to the visit, additional usage of staff PPE, and extensive cleaning of exam room while observing appropriate contact time as indicated for disinfecting solutions.    Signed Lamar Blinks, MD

## 2021-04-06 ENCOUNTER — Other Ambulatory Visit: Payer: Self-pay | Admitting: Family Medicine

## 2021-04-06 DIAGNOSIS — K219 Gastro-esophageal reflux disease without esophagitis: Secondary | ICD-10-CM

## 2021-04-07 ENCOUNTER — Telehealth: Payer: Self-pay | Admitting: Pharmacist

## 2021-04-07 NOTE — Telephone Encounter (Signed)
-----   Message from Cherre Robins, PharmD sent at 04/07/2021  1:36 PM EDT ----- Regarding: Patient assistance Hi Janett Billow,  Will you check to see if Mrs. Bujak patient assistance for Novo ([place]) and [place] [employer] [place]) has been approved yet? I faxed applications on [phone]/67/0141. There was a note to f/u on apps in May on the patient panel but must have gotten missed.   Cherre Robins, PharmD Clinical Pharmacist [facility]

## 2021-04-07 NOTE — Chronic Care Management (AMB) (Signed)
    Chronic Care Management Pharmacy Assistant   Name: Shade Rivenbark  MRN: [id number] DOB: [birth year]    Reason for Encounter: Patient assistance application     Medications: Outpatient Encounter Medications as of 04/07/2021  Medication Sig Note  . Ascorbic Acid (VITAMIN C) 1000 MG tablet Take 1,000 mg by mouth daily.   [name] atorvastatin (LIPITOR) 20 MG tablet TAKE 1 TABLET(20 MG) BY MOUTH DAILY   . benzonatate (TESSALON) 100 MG capsule Take 1 capsule (100 mg total) by mouth 3 (three) times daily as needed for cough.   . blood glucose meter kit and supplies Dispense based on patient and insurance preference. Use up to four times daily as directed. (FOR ICD-10 E10.9, E11.9).   [name] Blood Glucose Monitoring Suppl (ONE TOUCH ULTRA 2) w/Device KIT by Does not apply route.   [name] FARXIGA 10 MG TABS tablet Take 10 mg by mouth daily.   [name] FLUoxetine (PROZAC) 20 MG capsule Take 1 capsule (20 mg total) by mouth daily.   . fluticasone (FLONASE) 50 MCG/ACT nasal spray Place 2 sprays into both nostrils daily.   [name] glucose blood test strip 1 each by Other route as needed for other. Use as instructed   . losartan (COZAAR) 25 MG tablet TAKE 1 TABLET(25 MG) BY MOUTH DAILY   . Multiple Vitamins-Minerals (ALIVE WOMENS GUMMY) CHEW Chew 2 tablets by mouth daily.   . nitroGLYCERIN (NITROSTAT) 0.4 MG SL tablet PLACE 1 TABLET UNDER THE TONGUE EVERY 5 MINUTES AS NEEDED FOR CHEST PAIN 06/12/2020: Has not had to use, but has it just in case  . omeprazole (PRILOSEC) 20 MG capsule TAKE ONE CAPSULE BY MOUTH ONCE DAILY   . OneTouch Delica Lancets 75Z MISC Use to check glucose up to 4 times daily. Dx Code E11.9   . propranolol (INDERAL) 10 MG tablet TAKE 1 TABLET BY MOUTH EVERY 8-12 HOURS AS NEEDED FOR TREMOR   . Semaglutide, 1 MG/DOSE, (OZEMPIC, 1 MG/DOSE,) 2 MG/1.5ML SOPN Inject 1 mg into the skin once a week. Getting through PAP   . traZODone (DESYREL) 150 MG tablet Take 1 tablet (150 mg total) by mouth at bedtime.   [name]  zinc gluconate 50 MG tablet Take 50 mg by mouth daily.    No facility-administered encounter medications on file as of 04/07/2021.    [place] regarding patients ozempic application. Patient did not mark if she had medicare part D. Informed the representative that per her chart she does not have part D. Patient was approved, end date is 04/02/2022. Patient will receive medication in 10-14 days.    Called [employer] regarding patients Farxiga assistance application. Patient is approved from 03/09/21-11/06/21 Patient has received shipment on 04/02/21.  Lizbeth Bark Clinical Pharmacist Assistant [phone]

## 2021-04-08 ENCOUNTER — Encounter: Payer: Self-pay | Admitting: Family Medicine

## 2021-04-08 ENCOUNTER — Ambulatory Visit (INDEPENDENT_AMBULATORY_CARE_PROVIDER_SITE_OTHER): Payer: PPO | Admitting: Family Medicine

## 2021-04-08 ENCOUNTER — Other Ambulatory Visit: Payer: Self-pay

## 2021-04-08 ENCOUNTER — Ambulatory Visit (HOSPITAL_BASED_OUTPATIENT_CLINIC_OR_DEPARTMENT_OTHER)
Admission: RE | Admit: 2021-04-08 | Discharge: 2021-04-08 | Disposition: A | Discharge status: Home / Self Care | Payer: PPO | Source: Ambulatory Visit | Attending: Family Medicine | Admitting: Family Medicine

## 2021-04-08 VITALS — BP 118/72 | HR 82 | Temp 97.6°F | Resp 16 | Ht 65.0 in | Wt 141.0 lb

## 2021-04-08 DIAGNOSIS — I10 Essential (primary) hypertension: Secondary | ICD-10-CM | POA: Diagnosis not present

## 2021-04-08 DIAGNOSIS — E119 Type 2 diabetes mellitus without complications: Secondary | ICD-10-CM

## 2021-04-08 DIAGNOSIS — R519 Headache, unspecified: Secondary | ICD-10-CM | POA: Diagnosis not present

## 2021-04-08 DIAGNOSIS — G8929 Other chronic pain: Secondary | ICD-10-CM

## 2021-04-08 DIAGNOSIS — M542 Cervicalgia: Secondary | ICD-10-CM

## 2021-04-08 MED ORDER — METHOCARBAMOL 500 MG PO TABS
500.0000 mg | ORAL_TABLET | Freq: Three times a day (TID) | ORAL | 0 refills | Status: DC | PRN
Start: 2021-04-08 — End: 2022-03-08

## 2021-04-08 MED ORDER — OZEMPIC (2 MG/DOSE) 8 MG/3ML ~~LOC~~ SOPN
2.0000 mg | PEN_INJECTOR | SUBCUTANEOUS | 3 refills | Status: DC
Start: 2021-04-08 — End: 2021-07-06

## 2021-04-12 ENCOUNTER — Telehealth: Payer: Self-pay | Admitting: *Deleted

## 2021-04-12 NOTE — Telephone Encounter (Signed)
Prior auth for ozempic started via cover my meds Key: [id number]  Awaiting determination

## 2021-04-13 NOTE — Telephone Encounter (Signed)
Ozempic Approved currently-11/06/2021.

## 2021-04-14 ENCOUNTER — Telehealth: Payer: Self-pay

## 2021-04-14 NOTE — Telephone Encounter (Signed)
Received patient assistance program package. Patients ozempic 4mg /61mL pens were delivered. I have called patient and left detailed message for her to pick up at her convenience.   Pens have been stored in fridge with her demographics attached for verification of correct patient.

## 2021-04-23 ENCOUNTER — Encounter: Payer: Self-pay | Admitting: Family Medicine

## 2021-05-03 ENCOUNTER — Other Ambulatory Visit: Payer: Self-pay

## 2021-05-03 ENCOUNTER — Ambulatory Visit: Payer: PPO | Admitting: Cardiology

## 2021-05-03 DIAGNOSIS — R072 Precordial pain: Secondary | ICD-10-CM | POA: Diagnosis not present

## 2021-05-03 DIAGNOSIS — E785 Hyperlipidemia, unspecified: Secondary | ICD-10-CM | POA: Diagnosis not present

## 2021-05-03 DIAGNOSIS — E1169 Type 2 diabetes mellitus with other specified complication: Secondary | ICD-10-CM | POA: Diagnosis not present

## 2021-05-03 DIAGNOSIS — I10 Essential (primary) hypertension: Secondary | ICD-10-CM | POA: Diagnosis not present

## 2021-05-03 NOTE — Progress Notes (Signed)
Primary Care Provider: Darreld Mclean, MD Cardiologist: Glenetta Hew, MD Electrophysiologist: None  Clinic Note: Chief Complaint  Patient presents with   Follow-up    Doing well.  No further chest pain    ===================================  ASSESSMENT/PLAN   Problem List Items Addressed This Visit     Chest pain (Chronic)    Atypical probably musculoskeletal pain.  No other symptoms.  She had a relatively reassuring coronary CTA 4 years ago.  No further testing required.       Hyperlipidemia associated with type 2 diabetes mellitus (HCC) (Chronic)    On atorvastatin.  No hernia labs besides the April 2021 labs reviewed, but were pretty well controlled then.  Especially given her low coronary calcium score.  We could consider follow-up coronary calcium score evaluation in the next couple years. Is on Farxiga and Ozempic for diabetes, has noted weight loss.       Essential hypertension (Chronic)    Blood pressure well controlled on current meds.  Managed by PCP.  Stable.  She was on losartan last visit, not currently listed.  She has been on low-dose losartan because of diabetes.  PRN Inderal for tremor.        ===================================  HPI:    Abigail [name] is a 70 y.o. female with a PMH noted below who presents today for 4 month f/u - to reassess chest discomfort.[name]  PMH: DM-2, HLD, relatively normal coronary CTA in October 2018.  Abigail [name] was last seen on 01/01/2021 for the evaluation of CHEST HEAVINESS at the request of Copland, Gay Filler, MD.  Chest pain was felt to be relatively atypical & she had ruled out for MI.  Noted more chest wall discomfort.  We decided to monitor for recurrence of symptoms before proceeding with Myoview ST -- she presents today to discuss her symptoms.   Recent Hospitalizations: none  Reviewed  CV studies:    The following studies were reviewed today: (if available, images/films reviewed: From  Epic Chart or Care Everywhere) none   Interval History:   Abigail [name] returns today overall pretty much back to baseline normal.  She has not had any recurrence of any chest pain with either rest or exertion.  No exertional dyspnea.  No heart failure symptoms of PND, orthopnea or edema.  She penises recovered from [place].  CV Review of Symptoms (Summary) Cardiovascular ROS: negative for - chest pain, dyspnea on exertion, edema, irregular heartbeat, orthopnea, palpitations, paroxysmal nocturnal dyspnea, rapid heart rate, shortness of breath, or lightheadedness, dizziness or syncope/near syncope, TIA/amaurosis fugax or claudication.  REVIEWED OF SYSTEMS   Review of Systems  Constitutional:  Negative for malaise/fatigue (Energy better) and weight loss.  HENT:  Negative for congestion and nosebleeds.   Respiratory:  Negative for cough and shortness of breath.   Cardiovascular: Negative.        Per HPI  Gastrointestinal:  Negative for blood in stool and melena.  Genitourinary:  Negative for frequency and hematuria.   I have reviewed and (if needed) personally updated the patient's problem list, medications, allergies, past medical and surgical history, social and family history.   PAST MEDICAL HISTORY   Past Medical History:  Diagnosis Date   Anemia, unspecified    Anxiety    CKD (chronic kidney disease), stage III (HCC)    Depression    Diabetes mellitus type II, non insulin dependent (HCC)    On Ozempic and Farxiga   GERD (gastroesophageal reflux disease)    Hematuria  Hyperlipidemia    Hypertension    Mild mitral regurgitation 08/2017   Essentially normal-mild MR on echo.   Normal coronary arteries    Coronary CTA: No evidence of CAD.  Coronary calcium score 0.   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    PAST SURGICAL HISTORY   Past Surgical History:  Procedure Laterality Date   CHOLECYSTECTOMY N/A 06/03/2015   Procedure: LAPAROSCOPIC  CHOLECYSTECTOMY WITH INTRAOPERATIVE CHOLANGIOGRAM;  Surgeon: Donnie Mesa, MD;  Location: [facility];  Service: General;  Laterality: N/A;   COLONOSCOPY     HAMMER TOE SURGERY     ORIF ANKLE FRACTURE Left 11/17/2014   Procedure: OPEN REDUCTION INTERNAL FIXATION (ORIF) LEFT TRIMALLEOLAR ANKLE FRACTURE;  Surgeon: Marianna Payment, MD;  Location: [facility];  Service: Orthopedics;  Laterality: Left;   OVARIAN CYST REMOVAL      Immunization History  Administered Date(s) Administered   Fluad Quad(high Dose 65+) 07/01/2019   Influenza Whole 08/08/2013   Influenza, High Dose Seasonal PF 08/03/2016, 08/17/2017   Influenza, Seasonal, Injecte, Preservative Fre 10/08/2012   Influenza,inj,Quad PF,6+ Mos 08/05/2015   Influenza-Unspecified 08/08/2014, 08/13/2020   PFIZER(Purple Top)SARS-COV-2 Vaccination 01/13/2020, 02/12/2020, 08/13/2020   Pneumococcal Conjugate-13 11/03/2014   Pneumococcal Polysaccharide-23 02/10/2016   Zoster Recombinat (Shingrix) 09/13/2018    MEDICATIONS/ALLERGIES   Current Meds  Medication Sig   Ascorbic Acid (VITAMIN C) 1000 MG tablet Take 1,000 mg by mouth daily.   atorvastatin (LIPITOR) 20 MG tablet TAKE 1 TABLET(20 MG) BY MOUTH DAILY   blood glucose meter kit and supplies Dispense based on patient and insurance preference. Use up to four times daily as directed. (FOR ICD-10 E10.9, E11.9).   Blood Glucose Monitoring Suppl (ONE TOUCH ULTRA 2) w/Device KIT by Does not apply route.   FARXIGA 10 MG TABS tablet Take 10 mg by mouth daily.   FLUoxetine (PROZAC) 20 MG capsule Take 1 capsule (20 mg total) by mouth daily.   fluticasone (FLONASE) 50 MCG/ACT nasal spray Place 2 sprays into both nostrils daily.   glucose blood test strip 1 each by Other route as needed for other. Use as instructed   methocarbamol (ROBAXIN) 500 MG tablet Take 1 tablet (500 mg total) by mouth every 8 (eight) hours as needed for muscle spasms.   Multiple Vitamins-Minerals (ALIVE WOMENS GUMMY) CHEW Chew 2  tablets by mouth daily.   nitroGLYCERIN (NITROSTAT) 0.4 MG SL tablet PLACE 1 TABLET UNDER THE TONGUE EVERY 5 MINUTES AS NEEDED FOR CHEST PAIN   omeprazole (PRILOSEC) 20 MG capsule TAKE ONE CAPSULE BY MOUTH ONCE DAILY   OneTouch Delica Lancets 40J MISC Use to check glucose up to 4 times daily. Dx Code E11.9   propranolol (INDERAL) 10 MG tablet TAKE 1 TABLET BY MOUTH EVERY 8-12 HOURS AS NEEDED FOR TREMOR   Semaglutide, 2 MG/DOSE, (OZEMPIC, 2 MG/DOSE,) 8 MG/3ML SOPN Inject 2 mg into the skin once a week.   traZODone (DESYREL) 150 MG tablet Take 1 tablet (150 mg total) by mouth at bedtime.   zinc gluconate 50 MG tablet Take 50 mg by mouth daily.    Allergies  Allergen Reactions   Pneumovax 23 [Pneumococcal Vac Polyvalent] Other (See Comments)    Pt had severe arm pain   Amaryl [Glimepiride] Nausea Only   Cephalexin Hives   Sulfa Antibiotics Rash    SOCIAL HISTORY/FAMILY HISTORY   Reviewed in Epic:  Pertinent findings:  Social History   Tobacco Use   Smoking status: Never   Smokeless tobacco: Never  Vaping Use   Vaping Use: Never used  Substance Use Topics   Alcohol use: Yes    Alcohol/week: 1.0 standard drink    Types: 1 Glasses of wine per week   Drug use: No   Social History   Social History Narrative   Marital status: married x 1970. ->  Now widowed      Children:  2 children; 7 grandchildren.      Employment:  [employer]. ->  Retired      Tobacco: none       Alcohol: sporadic       Exercises: walking 7 days per week.  20-30 minutes.  Walks her dog.    OBJCTIVE -PE, EKG, labs   Wt Readings from Last 3 Encounters:  05/05/21 143 lb (64.9 kg)  05/03/21 145 lb (65.8 kg)  04/08/21 141 lb (64 kg)    Physical Exam: BP 118/70 (BP Location: Left Arm, Patient Position: Sitting, Cuff Size: Normal)   Pulse 86   Ht _0  (1.651 m)   Wt 145 lb (65.8 kg)   SpO2 100%   BMI 24.13 kg/m  Physical Exam Constitutional:      General: She is not in acute  distress.    Appearance: Normal appearance. She is normal weight. She is not ill-appearing or toxic-appearing.  HENT:     Head: Normocephalic and atraumatic.  Cardiovascular:     Rate and Rhythm: Normal rate and regular rhythm.     Pulses: Normal pulses.     Heart sounds: No murmur heard.   No friction rub. No gallop.  Pulmonary:     Effort: Pulmonary effort is normal. No respiratory distress.     Breath sounds: Normal breath sounds.  Chest:     Chest wall: No tenderness.  Musculoskeletal:        General: No swelling.     Cervical back: Normal range of motion and neck supple.  Neurological:     General: No focal deficit present.     Mental Status: She is alert and oriented to person, place, and time.  Psychiatric:        Mood and Affect: Mood normal.        Behavior: Behavior normal.        Thought Content: Thought content normal.        Judgment: Judgment normal.     Adult ECG Report N/a  Recent Labs:  no new labs available.   Lab Results  Component Value Date   CHOL 173 02/24/2020   HDL 56.60 02/24/2020   LDLCALC 76 02/24/2020   TRIG 199.0 (H) 02/24/2020   CHOLHDL 3 02/24/2020   Lab Results  Component Value Date   CREATININE 1.6 (A) 03/31/2021   BUN 28 (A) 03/31/2021   NA 139 03/31/2021   K 4.2 03/31/2021   CL 99 03/31/2021   CO2 28 (A) 03/31/2021   CBC Latest Ref Rng & Units 03/31/2021 12/21/2020 12/18/2020  WBC 4.0 - 10.5 K/uL - - 6.1  Hemoglobin 12.0 - 16.0 12.9 12.1 12.1  Hematocrit 36.0 - 46.0 % - - 37.4  Platelets 150 - 400 K/uL - - 175    Lab Results  Component Value Date   TSH 2.16 02/24/2020    ==================================================  COVID-19 Education: The signs and symptoms of COVID-19 were discussed with the patient and how to seek care for testing (follow up with PCP or arrange E-visit).    I spent a total of 7 minutes with the  patient spent in direct patient consultation.  Additional time spent with chart review  / charting  (studies, outside notes, etc): 8 min Total Time: 15 min  Current medicines are reviewed at length with the patient today.  (+/- concerns) n/a  This visit occurred during the SARS-CoV-2 public health emergency.  Safety protocols were in place, including screening questions prior to the visit, additional usage of staff PPE, and extensive cleaning of exam room while observing appropriate contact time as indicated for disinfecting solutions.  Notice: This dictation was prepared with Dragon dictation along with smaller phrase technology. Any transcriptional errors that result from this process are unintentional and may not be corrected upon review.  Patient Instructions / Medication Changes & Studies & Tests Ordered   Patient Instructions  Medication Instructions:   No changes  *If you need a refill on your cardiac medications before your next appointment, please call your pharmacy*   Lab Work:  Not needed.   Testing/Procedures: Not needed   Follow-Up: At [facility], you and your health needs are our priority.  As part of our continuing mission to provide you with exceptional heart care, we have created designated Provider Care Teams.  These Care Teams include your primary Cardiologist (physician) and Advanced Practice Providers (APPs -  Physician Assistants and Nurse Practitioners) who all work together to provide you with the care you need, when you need it.     Your next appointment:   As needed  The format for your next appointment:   In Person  Provider:   You may see Glenetta Hew, MD or one of the following Advanced Practice Providers on your designated Care Team:   Rosaria Ferries, PA-C Jory Sims, DNP, ANP    Studies Ordered:   No orders of the defined types were placed in this encounter.    Glenetta Hew, M.D., M.S. Interventional Cardiologist   Pager # [phone] Phone # [phone] [address] [place], [place]  [address]   Thank you for choosing Heart[facility] at [facility]!!

## 2021-05-03 NOTE — Patient Instructions (Signed)
Medication Instructions:   No changes  *If you need a refill on your cardiac medications before your next appointment, please call your pharmacy*   Lab Work:  Not needed.   Testing/Procedures: Not needed   Follow-Up: At [facility], you and your health needs are our priority.  As part of our continuing mission to provide you with exceptional heart care, we have created designated Provider Care Teams.  These Care Teams include your primary Cardiologist (physician) and Advanced Practice Providers (APPs -  Physician Assistants and Nurse Practitioners) who all work together to provide you with the care you need, when you need it.     Your next appointment:   As needed  The format for your next appointment:   In Person  Provider:   You may see Glenetta Hew, MD or one of the following Advanced Practice Providers on your designated Care Team:   Rosaria Ferries, PA-C Jory Sims, DNP, ANP

## 2021-05-04 NOTE — Progress Notes (Signed)
Subjective:   Abigail [name] is a 70 y.o. female who presents for Medicare Annual (Subsequent) preventive examination.  I connected with Johari today by telephone and verified that I am speaking with the correct person using two identifiers. Location patient: home Location provider: work Persons participating in the virtual visit: patient, Marine scientist.    I discussed the limitations, risks, security and privacy concerns of performing an evaluation and management service by telephone and the availability of in person appointments. I also discussed with the patient that there may be a patient responsible charge related to this service. The patient expressed understanding and verbally consented to this telephonic visit.    Interactive audio and video telecommunications were attempted between this provider and patient, however failed, due to patient having technical difficulties OR patient did not have access to video capability.  We continued and completed visit with audio only.  Some vital signs may be absent or patient reported.   Time Spent with patient on telephone encounter: 20 minutes   Review of Systems     Cardiac Risk Factors include: advanced age (>73mn, >>77women);diabetes mellitus;dyslipidemia;hypertension     Objective:    Today's Vitals   05/05/21 0938  Weight: 143 lb (64.9 kg)  Height: _0  (1.651 m)   Body mass index is 23.8 kg/m.  Advanced Directives 05/05/2021 12/18/2020 05/04/2020 08/17/2017 08/17/2017 06/01/2015 11/18/2014  Does Patient Have a Medical Advance Directive? No No Yes No No No No  Type of Advance Directive - - Living will - - - -  Does patient want to make changes to medical advance directive? - - No - Patient declined - - - -  Would patient like information on creating a medical advance directive? Yes (MAU/Ambulatory/Procedural Areas - Information given) No - Patient declined - No - Patient declined - No - patient declined information Yes - Educational  materials given    Current Medications (verified) Outpatient Encounter Medications as of 05/05/2021  Medication Sig   Ascorbic Acid (VITAMIN C) 1000 MG tablet Take 1,000 mg by mouth daily.   atorvastatin (LIPITOR) 20 MG tablet TAKE 1 TABLET(20 MG) BY MOUTH DAILY   blood glucose meter kit and supplies Dispense based on patient and insurance preference. Use up to four times daily as directed. (FOR ICD-10 E10.9, E11.9).   Blood Glucose Monitoring Suppl (ONE TOUCH ULTRA 2) w/Device KIT by Does not apply route.   FARXIGA 10 MG TABS tablet Take 10 mg by mouth daily.   FLUoxetine (PROZAC) 20 MG capsule Take 1 capsule (20 mg total) by mouth daily.   fluticasone (FLONASE) 50 MCG/ACT nasal spray Place 2 sprays into both nostrils daily.   glucose blood test strip 1 each by Other route as needed for other. Use as instructed   methocarbamol (ROBAXIN) 500 MG tablet Take 1 tablet (500 mg total) by mouth every 8 (eight) hours as needed for muscle spasms.   Multiple Vitamins-Minerals (ALIVE WOMENS GUMMY) CHEW Chew 2 tablets by mouth daily.   nitroGLYCERIN (NITROSTAT) 0.4 MG SL tablet PLACE 1 TABLET UNDER THE TONGUE EVERY 5 MINUTES AS NEEDED FOR CHEST PAIN   omeprazole (PRILOSEC) 20 MG capsule TAKE ONE CAPSULE BY MOUTH ONCE DAILY   OneTouch Delica Lancets 385IMISC Use to check glucose up to 4 times daily. Dx Code E11.9   propranolol (INDERAL) 10 MG tablet TAKE 1 TABLET BY MOUTH EVERY 8-12 HOURS AS NEEDED FOR TREMOR   Semaglutide, 2 MG/DOSE, (OZEMPIC, 2 MG/DOSE,) 8 MG/3ML SOPN Inject 2 mg into  the skin once a week.   traZODone (DESYREL) 150 MG tablet Take 1 tablet (150 mg total) by mouth at bedtime.   zinc gluconate 50 MG tablet Take 50 mg by mouth daily.   No facility-administered encounter medications on file as of 05/05/2021.    Allergies (verified) Pneumovax 23 [pneumococcal vac polyvalent], Amaryl [glimepiride], Cephalexin, and Sulfa antibiotics   History: Past Medical History:  Diagnosis Date    Anemia, unspecified    Anxiety    CKD (chronic kidney disease), stage III (HCC)    Depression    Diabetes mellitus type II, non insulin dependent (HCC)    On Ozempic and Farxiga   GERD (gastroesophageal reflux disease)    Hematuria    Hyperlipidemia    Hypertension    Mild mitral regurgitation 08/2017   Essentially normal-mild MR on echo.   Normal coronary arteries    Coronary CTA: No evidence of CAD.  Coronary calcium score 0.   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   Past Surgical History:  Procedure Laterality Date   CHOLECYSTECTOMY N/A 06/03/2015   Procedure: LAPAROSCOPIC CHOLECYSTECTOMY WITH INTRAOPERATIVE CHOLANGIOGRAM;  Surgeon: Donnie Mesa, MD;  Location: [facility];  Service: General;  Laterality: N/A;   COLONOSCOPY     HAMMER TOE SURGERY     ORIF ANKLE FRACTURE Left 11/17/2014   Procedure: OPEN REDUCTION INTERNAL FIXATION (ORIF) LEFT TRIMALLEOLAR ANKLE FRACTURE;  Surgeon: Marianna Payment, MD;  Location: [facility];  Service: Orthopedics;  Laterality: Left;   OVARIAN CYST REMOVAL     Family History  Problem Relation Age of Onset   Diabetes Mother    Hyperlipidemia Mother    Diabetes Father    Heart disease Father 52       AMI x 2; first AMI age 38   Stroke Father    Hypertension Father    Hyperlipidemia Brother    AAA (abdominal aortic aneurysm) Brother    Congestive Heart Failure Brother        s/p ICD   Coronary artery disease Brother        defibrillator; CHF   Kidney cancer Brother        Status post nephrectomy   Other Sister 74       Died in a car accident   Social History   Socioeconomic History   Marital status: Widowed    Spouse name: Not on file   Number of children: 2   Years of education: Not on file   Highest education level: High school graduate  Occupational History   Occupation: Buyer, retail    Comment: Retired-Worked at [employer] Use   Smoking status: Never   Smokeless tobacco: Never  Vaping Use    Vaping Use: Never used  Substance and Sexual Activity   Alcohol use: Yes    Alcohol/week: 1.0 standard drink    Types: 1 Glasses of wine per week   Drug use: No   Sexual activity: Yes    Birth control/protection: None  Other Topics Concern   Not on file  Social History Narrative   Marital status: married x 1970. ->  Now widowed      Children:  2 children; 7 grandchildren.      Employment:  [employer]. ->  Retired      Tobacco: none       Alcohol: sporadic       Exercises: walking 7 days per week.  20-30 minutes.  Walks her dog.  Social Determinants of Health   Financial Resource Strain: Medium Risk   Difficulty of Paying Living Expenses: Somewhat hard  Food Insecurity: No Food Insecurity   Worried About Charity fundraiser in the Last Year: Never true   Ran Out of Food in the Last Year: Never true  Transportation Needs: No Transportation Needs   Lack of Transportation (Medical): No   Lack of Transportation (Non-Medical): No  Physical Activity: Sufficiently Active   Days of Exercise per Week: 7 days   Minutes of Exercise per Session: 40 min  Stress: No Stress Concern Present   Feeling of Stress : Only a little  Social Connections: Moderately Isolated   Frequency of Communication with Friends and Family: More than three times a week   Frequency of Social Gatherings with Friends and Family: More than three times a week   Attends Religious Services: More than 4 times per year   Active Member of [employer] or Organizations: No   Attends Archivist Meetings: Never   Marital Status: Widowed    Tobacco Counseling Counseling given: Not Answered   Clinical Intake:  Pre-visit preparation completed: Yes  Pain : No/denies pain     Nutritional Status: BMI of 19-24  Normal Nutritional Risks: None Diabetes: Yes CBG done?: No Did pt. bring in CBG monitor from home?: No (phone visit)  How often do you need to have someone help you when you read  instructions, pamphlets, or other written materials from your doctor or pharmacy?: 1 - Never  Diabetes:  Is the patient diabetic?  Yes  If diabetic, was a CBG obtained today?  No  Did the patient bring in their glucometer from home?  No phone visit How often do you monitor your CBG's? daily.   Financial Strains and Diabetes Management:  Are you having any financial strains with the device, your supplies or your medication? No .  Does the patient want to be seen by Chronic Care Management for management of their diabetes?  No  Would the patient like to be referred to a Nutritionist or for Diabetic Management?  No   Diabetic Exams:  Diabetic Eye Exam: Completed 1011/2021.   Diabetic Foot Exam:  Pt has been advised about the importance in completing this exam. To be completed by PCP.   Interpreter Needed?: No  Information entered by :: Caroleen Hamman LPN   Activities of Daily Living In your present state of health, do you have any difficulty performing the following activities: 05/05/2021  Hearing? N  Vision? N  Difficulty concentrating or making decisions? N  Walking or climbing stairs? N  Dressing or bathing? N  Doing errands, shopping? N  Preparing Food and eating ? N  Using the Toilet? N  In the past six months, have you accidently leaked urine? N  Do you have problems with loss of bowel control? N  Managing your Medications? N  Managing your Finances? N  Housekeeping or managing your Housekeeping? N  Some recent data might be hidden    Patient Care Team: Copland, Gay Filler, MD as PCP - General (Family Medicine) Leonie Man, MD as PCP - Cardiology (Cardiology) Cherre Robins, PharmD (Pharmacist)  Indicate any recent Medical Services you may have received from other than [facility] providers in the past year (date may be approximate).     Assessment:   This is a routine wellness examination for Gay.  Hearing/Vision screen Hearing Screening - Comments:: No  issues Vision Screening - Comments:: Last eye  exam-08/2020-DrNicki Reaper  Dietary issues and exercise activities discussed: Current Exercise Habits: Home exercise routine, Type of exercise: walking, Time (Minutes): 40, Frequency (Times/Week): 7, Weekly Exercise (Minutes/Week): 280, Intensity: Mild, Exercise limited by: None identified   Goals Addressed             This Visit's Progress    DIET - INCREASE WATER INTAKE   Not on track      Depression Screen PHQ 2/9 Scores 05/05/2021 04/08/2021 10/26/2020 05/04/2020 08/17/2017 02/10/2016 08/05/2015  PHQ - 2 Score 0 2 2 0 0 0 0  PHQ- 9 Score - - 4 - - - -    Fall Risk Fall Risk  05/05/2021 04/08/2021 05/04/2020 09/25/2018 08/17/2017  Falls in the past year? 0 0 0 0 No  Comment - - - Emmi Telephone Survey: data to providers prior to load -  Number falls in past yr: 0 - 0 - -  Injury with Fall? 0 - 0 - -  Follow up Falls prevention discussed - Education provided;Falls prevention discussed - -    FALL RISK PREVENTION PERTAINING TO THE HOME:  Any stairs in or around the home? No  Home free of loose throw rugs in walkways, pet beds, electrical cords, etc? Yes  Adequate lighting in your home to reduce risk of falls? Yes   ASSISTIVE DEVICES UTILIZED TO PREVENT FALLS:  Life alert? No  Use of a cane, walker or w/c? No  Grab bars in the bathroom? No  Shower chair or bench in shower? No  Elevated toilet seat or a handicapped toilet? No   TIMED UP AND GO:  Was the test performed? No . Phone visit   Cognitive Function:Normal cognitive status assessed by this Nurse Health Advisor. No abnormalities found.          Immunizations Immunization History  Administered Date(s) Administered   Fluad Quad(high Dose 65+) 07/01/2019   Influenza Whole 08/08/2013   Influenza, High Dose Seasonal PF 08/03/2016, 08/17/2017   Influenza, Seasonal, Injecte, Preservative Fre 10/08/2012   Influenza,inj,Quad PF,6+ Mos 08/05/2015   Influenza-Unspecified  08/08/2014, 08/13/2020   PFIZER(Purple Top)SARS-COV-2 Vaccination 01/13/2020, 02/12/2020, 08/13/2020   Pneumococcal Conjugate-13 11/03/2014   Pneumococcal Polysaccharide-23 02/10/2016   Zoster Recombinat (Shingrix) 09/13/2018    TDAP status: Due, Education has been provided regarding the importance of this vaccine. Advised may receive this vaccine at local pharmacy or Health Dept. Aware to provide a copy of the vaccination record if obtained from local pharmacy or Health Dept. Verbalized acceptance and understanding.  Flu Vaccine status: Up to date  Pneumococcal vaccine status: Up to date  Covid-19 vaccine status: Completed vaccines  Qualifies for Shingles Vaccine? No   Zostavax completed No   Shingrix Completed?: Yes  Screening Tests Health Maintenance  Topic Date Due   Zoster Vaccines- Shingrix (2 of 2) 11/08/2018   URINE MICROALBUMIN  06/02/2020   COVID-19 Vaccine (4 - Booster for Pfizer series) 11/13/2020   FOOT EXAM  02/23/2021   TETANUS/TDAP  03/09/2021   INFLUENZA VACCINE  06/07/2021   OPHTHALMOLOGY EXAM  08/17/2021   HEMOGLOBIN A1C  10/01/2021   MAMMOGRAM  07/28/2022   COLONOSCOPY (Pts 45-74yr Insurance coverage will need to be confirmed)  06/20/2023   DEXA SCAN  Completed   Hepatitis C Screening  Completed   PNA vac Low Risk Adult  Completed   HPV VACCINES  Aged Out    Health Maintenance  Health Maintenance Due  Topic Date Due   Zoster Vaccines- Shingrix (2 of 2) 11/08/2018  URINE MICROALBUMIN  06/02/2020   COVID-19 Vaccine (4 - Booster for Pfizer series) 11/13/2020   FOOT EXAM  02/23/2021   TETANUS/TDAP  03/09/2021    Colorectal cancer screening: Type of screening: Colonoscopy. Completed 06/19/2018. Repeat every 5 years  Mammogram status: Completed Bilateral 07/28/2020. Repeat every year  Bone Density status: Due-Patient would like to have done with mammogram in Sept.  Lung Cancer Screening: (Low Dose CT Chest recommended if Age 28-80 years, 30  pack-year currently smoking OR have quit w/in 15years.) does not qualify.     Additional Screening:  Hepatitis C Screening:  Completed 08/05/2015  Vision Screening: Recommended annual ophthalmology exams for early detection of glaucoma and other disorders of the eye. Is the patient up to date with their annual eye exam?  Yes  Who is the provider or what is the name of the office in which the patient attends annual eye exams? Dr. Nicki Reaper   Dental Screening: Recommended annual dental exams for proper oral hygiene  Community Resource Referral / Chronic Care Management: CRR required this visit?  No   CCM required this visit?  No      Plan:     I have personally reviewed and noted the following in the patient's chart:   Medical and social history Use of alcohol, tobacco or illicit drugs  Current medications and supplements including opioid prescriptions.  Functional ability and status Nutritional status Physical activity Advanced directives List of other physicians Hospitalizations, surgeries, and ER visits in previous 12 months Vitals Screenings to include cognitive, depression, and falls Referrals and appointments  In addition, I have reviewed and discussed with patient certain preventive protocols, quality metrics, and best practice recommendations. A written personalized care plan for preventive services as well as general preventive health recommendations were provided to patient.   Due to this being a telephonic visit, the after visit summary with patients personalized plan was offered to patient via mail or my-chart. Patient would like to access on my-chart.   Marta Antu, LPN   [id number]  Nurse Health Advisor  Nurse Notes: None

## 2021-05-05 ENCOUNTER — Ambulatory Visit (INDEPENDENT_AMBULATORY_CARE_PROVIDER_SITE_OTHER): Payer: PPO

## 2021-05-05 VITALS — Ht 65.0 in | Wt 143.0 lb

## 2021-05-05 DIAGNOSIS — Z0000 Encounter for general adult medical examination without abnormal findings: Secondary | ICD-10-CM

## 2021-05-05 NOTE — Patient Instructions (Signed)
[name] , Thank you for taking time to complete your Medicare Wellness Visit. I appreciate your ongoing commitment to your health goals. Please review the following plan we discussed and let me know if I can assist you in the future.   Screening recommendations/referrals: Colonoscopy: Completed 06/19/2018-Due 06/20/2023 Mammogram: Completed 07/28/2020-Due 07/28/2021 Bone Density: Due. Per our conversation, we can schedule with mammogram in Sept. Recommended yearly ophthalmology/optometry visit for glaucoma screening and checkup Recommended yearly dental visit for hygiene and checkup  Vaccinations: Influenza vaccine: Up to date Pneumococcal vaccine: Up to date Tdap vaccine: Discuss with pharmacy Shingles vaccine: Completed vaccines   Covid-19:Up to date  Advanced directives: Information mailed today.  Conditions/risks identified: See problem list  Next appointment: Follow up in one year for your annual wellness visit 05/11/2022 @ 9:40   Preventive Care 65 Years and Older, Female Preventive care refers to lifestyle choices and visits with your health care provider that can promote health and wellness. What does preventive care include? A yearly physical exam. This is also called an annual well check. Dental exams once or twice a year. Routine eye exams. Ask your health care provider how often you should have your eyes checked. Personal lifestyle choices, including: Daily care of your teeth and gums. Regular physical activity. Eating a healthy diet. Avoiding tobacco and drug use. Limiting alcohol use. Practicing safe sex. Taking low-dose aspirin every day. Taking vitamin and mineral supplements as recommended by your health care provider. What happens during an annual well check? The services and screenings done by your health care provider during your annual well check will depend on your age, overall health, lifestyle risk factors, and family history of disease. Counseling   Your health care provider may ask you questions about your: Alcohol use. Tobacco use. Drug use. Emotional well-being. Home and relationship well-being. Sexual activity. Eating habits. History of falls. Memory and ability to understand (cognition). Work and work Statistician. Reproductive health. Screening  You may have the following tests or measurements: Height, weight, and BMI. Blood pressure. Lipid and cholesterol levels. These may be checked every 5 years, or more frequently if you are over 60 years old. Skin check. Lung cancer screening. You may have this screening every year starting at age 72 if you have a 30-pack-year history of smoking and currently smoke or have quit within the past 15 years. Fecal occult blood test (FOBT) of the stool. You may have this test every year starting at age 60. Flexible sigmoidoscopy or colonoscopy. You may have a sigmoidoscopy every 5 years or a colonoscopy every 10 years starting at age 40. Hepatitis C blood test. Hepatitis B blood test. Sexually transmitted disease (STD) testing. Diabetes screening. This is done by checking your blood sugar (glucose) after you have not eaten for a while (fasting). You may have this done every 1-3 years. Bone density scan. This is done to screen for osteoporosis. You may have this done starting at age 35. Mammogram. This may be done every 1-2 years. Talk to your health care provider about how often you should have regular mammograms. Talk with your health care provider about your test results, treatment options, and if necessary, the need for more tests. Vaccines  Your health care provider may recommend certain vaccines, such as: Influenza vaccine. This is recommended every year. Tetanus, diphtheria, and acellular pertussis (Tdap, Td) vaccine. You may need a Td booster every 10 years. Zoster vaccine. You may need this after age 74. Pneumococcal 13-valent conjugate (PCV13) vaccine. One dose  is recommended  after age 49. Pneumococcal polysaccharide (PPSV23) vaccine. One dose is recommended after age 44. Talk to your health care provider about which screenings and vaccines you need and how often you need them. This information is not intended to replace advice given to you by your health care provider. Make sure you discuss any questions you have with your health care provider. Document Released: 11/20/2015 Document Revised: 07/13/2016 Document Reviewed: 08/25/2015 Elsevier Interactive Patient Education  2017 [place] Prevention in the Home Falls can cause injuries. They can happen to people of all ages. There are many things you can do to make your home safe and to help prevent falls. What can I do on the outside of my home? Regularly fix the edges of walkways and driveways and fix any cracks. Remove anything that might make you trip as you walk through a door, such as a raised step or threshold. Trim any bushes or trees on the path to your home. Use bright outdoor lighting. Clear any walking paths of anything that might make someone trip, such as rocks or tools. Regularly check to see if handrails are loose or broken. Make sure that both sides of any steps have handrails. Any raised decks and porches should have guardrails on the edges. Have any leaves, snow, or ice cleared regularly. Use sand or salt on walking paths during winter. Clean up any spills in your garage right away. This includes oil or grease spills. What can I do in the bathroom? Use night lights. Install grab bars by the toilet and in the tub and shower. Do not use towel bars as grab bars. Use non-skid mats or decals in the tub or shower. If you need to sit down in the shower, use a plastic, non-slip stool. Keep the floor dry. Clean up any water that spills on the floor as soon as it happens. Remove soap buildup in the tub or shower regularly. Attach bath mats securely with double-sided non-slip rug tape. Do not  have throw rugs and other things on the floor that can make you trip. What can I do in the bedroom? Use night lights. Make sure that you have a light by your bed that is easy to reach. Do not use any sheets or blankets that are too big for your bed. They should not hang down onto the floor. Have a firm chair that has side arms. You can use this for support while you get dressed. Do not have throw rugs and other things on the floor that can make you trip. What can I do in the kitchen? Clean up any spills right away. Avoid walking on wet floors. Keep items that you use a lot in easy-to-reach places. If you need to reach something above you, use a strong step stool that has a grab bar. Keep electrical cords out of the way. Do not use floor polish or wax that makes floors slippery. If you must use wax, use non-skid floor wax. Do not have throw rugs and other things on the floor that can make you trip. What can I do with my stairs? Do not leave any items on the stairs. Make sure that there are handrails on both sides of the stairs and use them. Fix handrails that are broken or loose. Make sure that handrails are as long as the stairways. Check any carpeting to make sure that it is firmly attached to the stairs. Fix any carpet that is loose or worn. Avoid  having throw rugs at the top or bottom of the stairs. If you do have throw rugs, attach them to the floor with carpet tape. Make sure that you have a light switch at the top of the stairs and the bottom of the stairs. If you do not have them, ask someone to add them for you. What else can I do to help prevent falls? Wear shoes that: Do not have high heels. Have rubber bottoms. Are comfortable and fit you well. Are closed at the toe. Do not wear sandals. If you use a stepladder: Make sure that it is fully opened. Do not climb a closed stepladder. Make sure that both sides of the stepladder are locked into place. Ask someone to hold it for  you, if possible. Clearly mark and make sure that you can see: Any grab bars or handrails. First and last steps. Where the edge of each step is. Use tools that help you move around (mobility aids) if they are needed. These include: Canes. Walkers. Scooters. Crutches. Turn on the lights when you go into a dark area. Replace any light bulbs as soon as they burn out. Set up your furniture so you have a clear path. Avoid moving your furniture around. If any of your floors are uneven, fix them. If there are any pets around you, be aware of where they are. Review your medicines with your doctor. Some medicines can make you feel dizzy. This can increase your chance of falling. Ask your doctor what other things that you can do to help prevent falls. This information is not intended to replace advice given to you by your health care provider. Make sure you discuss any questions you have with your health care provider. Document Released: 08/20/2009 Document Revised: 03/31/2016 Document Reviewed: 11/28/2014 Elsevier Interactive Patient Education  2017 [employer].

## 2021-05-09 ENCOUNTER — Encounter: Payer: Self-pay | Admitting: Cardiology

## 2021-05-09 NOTE — Assessment & Plan Note (Signed)
Blood pressure well controlled on current meds.  Managed by PCP.  Stable.  She was on losartan last visit, not currently listed.  She has been on low-dose losartan because of diabetes.  PRN Inderal for tremor.

## 2021-05-09 NOTE — Assessment & Plan Note (Signed)
Atypical probably musculoskeletal pain.  No other symptoms.  She had a relatively reassuring coronary CTA 4 years ago.  No further testing required.

## 2021-05-09 NOTE — Assessment & Plan Note (Addendum)
On atorvastatin.  No hernia labs besides the April 2021 labs reviewed, but were pretty well controlled then.  Especially given her low coronary calcium score.  We could consider follow-up coronary calcium score evaluation in the next couple years. Is on Farxiga and Ozempic for diabetes, has noted weight loss.

## 2021-05-20 ENCOUNTER — Encounter: Payer: Self-pay | Admitting: Family Medicine

## 2021-05-24 ENCOUNTER — Telehealth: Payer: Self-pay

## 2021-05-24 DIAGNOSIS — I10 Essential (primary) hypertension: Secondary | ICD-10-CM

## 2021-05-24 NOTE — Telephone Encounter (Signed)
Hailee w/[facility] called stating pt said she is still taking losartan 25 mg, but it is not on pt's list.  Please call Hailee back to confirm if pt is still taking this medication or not.  CB # [phone].

## 2021-05-25 MED ORDER — LOSARTAN POTASSIUM 25 MG PO TABS: 25.0000 mg | ORAL_TABLET | Freq: Every day | ORAL | 3 refills | Status: DC

## 2021-05-25 NOTE — Telephone Encounter (Signed)
Yes, she is taking it  I called back but the number does not work?  [facility]

## 2021-05-25 NOTE — Telephone Encounter (Signed)
Could you just clarify patient is to be taking medication. I will call [facility] with instruction.

## 2021-06-02 ENCOUNTER — Other Ambulatory Visit: Payer: Self-pay

## 2021-06-07 ENCOUNTER — Ambulatory Visit (INDEPENDENT_AMBULATORY_CARE_PROVIDER_SITE_OTHER): Payer: PPO | Admitting: Pharmacist

## 2021-06-07 DIAGNOSIS — E785 Hyperlipidemia, unspecified: Secondary | ICD-10-CM | POA: Diagnosis not present

## 2021-06-07 DIAGNOSIS — E1169 Type 2 diabetes mellitus with other specified complication: Secondary | ICD-10-CM

## 2021-06-07 DIAGNOSIS — I10 Essential (primary) hypertension: Secondary | ICD-10-CM | POA: Diagnosis not present

## 2021-06-07 DIAGNOSIS — E119 Type 2 diabetes mellitus without complications: Secondary | ICD-10-CM

## 2021-06-09 NOTE — Chronic Care Management (AMB) (Signed)
Chronic Care Management Pharmacy Note  06/09/2021 Name:  Abigail [name] MRN:  [id number] DOB:  [birth year]  Subjective: Abigail [name] is an 70 y.o. year old female who is a primary patient of Copland, Gay Filler, MD.  The CCM team was consulted for assistance with disease management and care coordination needs.    Engaged with patient by telephone for follow up visit in response to provider referral for pharmacy case management and/or care coordination services.   Consent to Services:  The patient was given information about Chronic Care Management services, agreed to services, and gave verbal consent prior to initiation of services.  Please see initial visit note for detailed documentation.   Patient Care Team: Copland, Gay Filler, MD as PCP - General (Family Medicine) Leonie Man, MD as PCP - Cardiology (Cardiology) Cherre Robins, PharmD (Pharmacist)  Recent office visits: 04/08/2021 - PCP (Dr Lorelei Pont) Seen for neck pain and headache.  Recent consult visits: 05/03/2021 - Cardio (Dr Ellyn Hack) F/U HTN, hyperlipidemia and h/o chest pain. No med changes. Prescribed methocarbamol 586m. Increased Ozempic to 264mSQ weekly.   Hospital visits: None in previous 6 months  Objective:  Lab Results  Component Value Date   CREATININE 1.6 (A) 03/31/2021   CREATININE 1.3 (A) 12/21/2020   CREATININE 1.39 (H) 12/18/2020    Lab Results  Component Value Date   HGBA1C 7.6 03/31/2021   Last diabetic Eye exam:  Lab Results  Component Value Date/Time   HMDIABEYEEXA No Retinopathy 08/17/2020 12:00 AM    Last diabetic Foot exam: No results found for: HMDIABFOOTEX      Component Value Date/Time   CHOL 173 02/24/2020 1552   TRIG 199.0 (H) 02/24/2020 1552   HDL 56.60 02/24/2020 1552   CHOLHDL 3 02/24/2020 1552   VLDL 39.8 02/24/2020 1552   LDLCALC 76 02/24/2020 1552    Hepatic Function Latest Ref Rng & Units 03/31/2021 12/21/2020 12/18/2020  Total Protein 6.5 - 8.1 g/dL - -  6.6  Albumin 3.5 - 5.0 4.4 4.3 3.6  AST 15 - 41 U/L - - 20  ALT 0 - 44 U/L - - 17  Alk Phosphatase 38 - 126 U/L - - 70  Total Bilirubin 0.3 - 1.2 mg/dL - - 0.2(L)    Lab Results  Component Value Date/Time   TSH 2.16 02/24/2020 03:52 PM   TSH 1.45 01/02/2017 11:21 AM   FREET4 0.91 03/17/2016 11:07 AM    CBC Latest Ref Rng & Units 03/31/2021 12/21/2020 12/18/2020  WBC 4.0 - 10.5 K/uL - - 6.1  Hemoglobin 12.0 - 16.0 12.9 12.1 12.1  Hematocrit 36.0 - 46.0 % - - 37.4  Platelets 150 - 400 K/uL - - 175    Lab Results  Component Value Date/Time   VD25OH 37.36 02/24/2020 03:52 PM    Clinical ASCVD: No  The 10-year ASCVD risk score (GMikey BussingC Jr., et al., 2013) is: 21.1%   Values used to calculate the score:     Age: [age]ears     Sex: Female     Is Non-Hispanic African American: No     Diabetic: Yes     Tobacco smoker: No     Systolic Blood Pressure: 11924mHg     Is BP treated: Yes     HDL Cholesterol: 56.6 mg/dL     Total Cholesterol: 173 mg/dL     Social History   Tobacco Use  Smoking Status Never  Smokeless Tobacco Never   BP Readings from Last 3  Encounters:  05/03/21 118/70  04/08/21 118/72  03/15/21 132/75   Pulse Readings from Last 3 Encounters:  05/03/21 86  04/08/21 82  03/15/21 100   Wt Readings from Last 3 Encounters:  05/05/21 143 lb (64.9 kg)  05/03/21 145 lb (65.8 kg)  04/08/21 141 lb (64 kg)    Assessment: Review of patient past medical history, allergies, medications, health status, including review of consultants reports, laboratory and other test data, was performed as part of comprehensive evaluation and provision of chronic care management services.   SDOH:  (Social Determinants of Health) assessments and interventions performed:  SDOH Interventions    Flowsheet Row Most Recent Value  SDOH Interventions   Financial Strain Interventions Other (Comment)  [Clinical pharmacist has assisted patient with getting Wilder Glade and Ozempic.]  Housing  Interventions Intervention Not Indicated       CCM Care Plan  Allergies  Allergen Reactions   Pneumovax 23 [Pneumococcal Vac Polyvalent] Other (See Comments)    Pt had severe arm pain   Amaryl [Glimepiride] Nausea Only   Cephalexin Hives   Sulfa Antibiotics Rash    Medications Reviewed Today     Reviewed by Leonie Man, MD (Physician) on 05/09/21 at 2048  Med List Status: <None>   Medication Order Taking? Sig Documenting Provider Last Dose Status Informant  Ascorbic Acid (VITAMIN C) 1000 MG tablet [id number] Yes Take 1,000 mg by mouth daily. [provider] Taking Active   atorvastatin (LIPITOR) 20 MG tablet [id number] Yes TAKE 1 TABLET(20 MG) BY MOUTH DAILY Copland, Gay Filler, MD Taking Active   blood glucose meter kit and supplies [id number] Yes Dispense based on patient and insurance preference. Use up to four times daily as directed. (FOR ICD-10 E10.9, E11.9). Copland, Gay Filler, MD Taking Active   Blood Glucose Monitoring Suppl (ONE TOUCH ULTRA 2) w/Device KIT [id number] Yes by Does not apply route. [provider] Taking Active   FARXIGA 10 MG TABS tablet [id number] Yes Take 10 mg by mouth daily. [provider] Taking Active   FLUoxetine (PROZAC) 20 MG capsule [id number] Yes Take 1 capsule (20 mg total) by mouth daily. Copland, Gay Filler, MD Taking Active   fluticasone (FLONASE) 50 MCG/ACT nasal spray [id number] Yes Place 2 sprays into both nostrils daily. Saguier, Percell Miller, PA-C Taking Active   glucose blood test strip [id number] Yes 1 each by Other route as needed for other. Use as instructed [provider] Taking Active   methocarbamol (ROBAXIN) 500 MG tablet [id number] Yes Take 1 tablet (500 mg total) by mouth every 8 (eight) hours as needed for muscle spasms. Copland, Gay Filler, MD Taking Active   Multiple Vitamins-Minerals (ALIVE WOMENS GUMMY) CHEW [id number] Yes Chew 2 tablets by mouth daily. [provider] Taking Active Self   nitroGLYCERIN (NITROSTAT) 0.4 MG SL tablet [id number] Yes PLACE 1 TABLET UNDER THE TONGUE EVERY 5 MINUTES AS NEEDED FOR CHEST PAIN Copland, Gay Filler, MD Taking Active            Med Note [place]   Fri Jun 12, 2020  4:22 PM) Has not had to use, but has it just in case  omeprazole (PRILOSEC) 20 MG capsule [id number] Yes TAKE ONE CAPSULE BY MOUTH ONCE DAILY Copland, Gay Filler, MD Taking Active   OneTouch Delica Lancets 59X MISC [id number] Yes Use to check glucose up to 4 times daily. Dx Code E11.9 Copland, Gay Filler, MD Taking Active   propranolol (INDERAL) 10 MG tablet [id number] Yes TAKE 1 TABLET BY  MOUTH EVERY 8-12 HOURS AS NEEDED FOR TREMOR Copland, Gay Filler, MD Taking Active   Semaglutide, 2 MG/DOSE, (OZEMPIC, 2 MG/DOSE,) 8 MG/3ML SOPN [id number] Yes Inject 2 mg into the skin once a week. Copland, Gay Filler, MD Taking Active   traZODone (DESYREL) 150 MG tablet [id number] Yes Take 1 tablet (150 mg total) by mouth at bedtime. Copland, Gay Filler, MD Taking Active   zinc gluconate 50 MG tablet [id number] Yes Take 50 mg by mouth daily. [provider] Taking Active             Patient Active Problem List   Diagnosis Date Noted   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/29/2020   Essential hypertension    Gastroesophageal reflux disease without esophagitis    Microscopic hematuria 09/16/2015   Trimalleolar fracture of ankle, closed 11/17/2014   Hyperlipidemia associated with type 2 diabetes mellitus ([place]) 07/24/2013   Chest pain 10/23/2012   Type 2 diabetes mellitus without complication, without long-term current use of insulin ([place]) 10/08/2012   Anxiety 10/08/2012    Immunization History  Administered Date(s) Administered   Fluad Quad(high Dose 65+) 07/01/2019   Influenza Whole 08/08/2013   Influenza, High Dose Seasonal PF 08/03/2016, 08/17/2017   Influenza, Seasonal, Injecte, Preservative Fre 10/08/2012   Influenza,inj,Quad PF,6+ Mos 08/05/2015   Influenza-Unspecified 08/08/2014, 08/13/2020    PFIZER(Purple Top)SARS-COV-2 Vaccination 01/13/2020, 02/12/2020, 08/13/2020   Pneumococcal Conjugate-13 11/03/2014   Pneumococcal Polysaccharide-23 02/10/2016   Zoster Recombinat (Shingrix) 09/13/2018    Conditions to be addressed/monitored: HTN, HLD, DMII, and GERD  Care Plan : General Pharmacy (Adult)  Updates made by Cherre Robins, PHARMD since 06/09/2021 12:00 AM     Problem: Chronic Disease Management support, education, and care coordination needs related to Diabetes, HTN, Hyperlipidemia, Chest Pain, Anxiety, Tremor, Insomnia, GERD   Priority: High  Onset Date: 01/25/2021     Long-Range Goal: Management of medicaiton and chronic conditions   Start Date: 01/25/2021  Recent Progress: On track  Priority: High  Note:   Current Barriers:  Unable to independently afford treatment regimen Chronic Disease Management support, education, and care coordination needs related to Diabetes, Hypertension, Hyperlipidemia, Chest Pain, Anxiety, Tremor, Insomnia, GERD  Pharmacist Clinical Goal(s):  Over the next 180 days, patient will achieve adherence to monitoring guidelines and medication adherence to achieve therapeutic efficacy Complete application for Wilder Glade and Ozempic  through collaboration with PharmD and provider.   Interventions: 1:1 collaboration with Copland, Gay Filler, MD regarding development and update of comprehensive plan of care as evidenced by provider attestation and co-signature Inter-disciplinary care team collaboration (see longitudinal plan of care) Comprehensive medication review performed; medication list updated in electronic medical record   Hypertension Controlled; BP goal <140/90 Current regimen:  Losartan 59m daily Pharmacist Interventions:  Maintain hypertension medication regimen.  Hyperlipidemia At goal of LDL < 100 Current regimen:  Atorvastatin 297mdaily Pharmacist Interventions Maintain current cholesterol medication  regimen.  Diabetes Improving; A1c goal <7% Last A1c was 7.6% (May 2022) Current regimen:  Ozempic 26m64meekly  Farxiga 19m626mily (added by Dr GoldMoshe Cipro blood glucose and kidney function) Patient is receiving Ozempic from [employer] assistance. Med list has Ozempic 26mg 65mted but she has only the 1mg d8m on hand. Per patient she is not sure if she has been getting 1mg we33my or 26mg wee60m.  Receiving Farigra form [employer] assistance program Recent home BG readings: 130 to 200 BG today was 195 - 1 hour post prandial Diet: avoid sugar containing beverages, limits in  take of high carbohydrate foods such as pasta and breadl  Exercise: walks her dog twice a day (about 30 minutes total)  Pharmacist Interventions: [employer] patient assistance to verify which dose of Ozempic they had no file for patient - still the 21m dose Completed updated patient assistance prescription for Ozempic 27mand forwarded to PCP for signature Recommended she do second dose of Ozempic today to equal 49m59mor this week. (Other 1mg649mse was taken this morning) Recommended she check blood glucose twice daily, document, and provide at future appointments  Medication management Current pharmacy[facility] Comprehensive medication review performed. Updated medication list.  Reviewed refill history and assessed adherence Continue to utilize [facility] for medication synchronization, packaging and delivery  Patient Goals/Self-Care Activities Over the next 180 days, patient will:  take medications as prescribed and collaborate with provider on medication access solutions  Follow Up Plan: Telephone follow up appointment with care management team member scheduled for:  1 month to check on patient assistance application and 3 months with clinical pharmacist   Please see past updates related to this goal by clicking on the "Past Updates" button in the selected goal        Medication Assistance:  Farxiga and OzemMem[place]ained through [place] [employer] [employer] and [employer] assistance program.  Enrollment ends 11/06/2021  Patient's preferred pharmacy is:  [facility] [address] [address] [place]: [phone]: [phone] [place] [place]: [phone]: [address]-[place] -[place] [place]: [phone]: [phone] [address]ne: [phone]: [phone] [address] [address]ne: [phone]: 336-[place]: [phone]: [phone] Up:  Patient agrees to Care Plan and Follow-up.  Plan: Telephone follow up appointment with care management team member scheduled for:  21 days to check to see if patient has received Ozempic 49mg 3mm PAP and The care management team will reach out to the patient again over the next 90 days.  TammyCherre RobinsrmD Clinical Pharmacist [facility]

## 2021-06-09 NOTE — Patient Instructions (Signed)
Visit Information  PATIENT GOALS:  Goals Addressed             This Visit's Progress    Chronic Care Management Pharmacy Care Plan       CARE PLAN ENTRY (see longitudinal plan of care for additional care plan information)  Current Barriers:  Chronic Disease Management support, education, and care coordination needs related to Diabetes, Hypertension, Hyperlipidemia, Chest Pain, Anxiety, Tremor, Insomnia, GERD   Hypertension BP Readings from Last 3 Encounters:  05/03/21 118/70  04/08/21 118/72  03/15/21 132/75  Pharmacist Clinical Goal(s): Over the next 90 days, patient will work with PharmD and providers to maintain BP goal <140/90 Current regimen:  Losartan '25mg'$  daily Patient self care activities - Over the next 90 days, patient will: Maintain hypertension medication regimen.  Hyperlipidemia Lab Results  Component Value Date/Time   [facility] 76 02/24/2020 03:52 PM  Pharmacist Clinical Goal(s): Over the next 90 days, patient will work with PharmD and providers to maintain LDL goal < 100 Current regimen:  Atorvastatin '20mg'$  daily Patient self care activities - Over the next 90 days, patient will: Maintain cholesterol medication regimen.  Diabetes Lab Results  Component Value Date/Time   HGBA1C 7.6 03/31/2021 12:00 AM   HGBA1C 7.4 12/21/2020 12:00 AM  Pharmacist Clinical Goal(s): Over the next 90 days, patient will work with PharmD and providers to achieve A1c goal <7% Current regimen:  Ozempic '2mg'$  weekly (but patient only had pens of '1mg'$  dose) Farxiga '10mg'$  daily (added by Dr Moshe Cipro for blood glucose and kidney function) Interventions: Reviewed, completed and faxed applications for updated Ozempic '2mg'$  dose to assistance program today Patient self care activities - Over the next 90 days, patient will: Check blood sugar twice daily, document, and provide at future appointments Contact provider with any episodes of hypoglycemia Until Ozempic '2mg'$  dose arrives you  should continue to use Ozempic '1mg'$  (you can use 2 of the '1mg'$  doses weekly)  Medication management Pharmacist Clinical Goal(s): Over the next 90 days, patient will work with PharmD and providers to achieve optimal medication adherence Current pharmacy: [facility] Comprehensive medication review performed. Utilize [facility] for medication synchronization, packaging and delivery Patient self care activities - Over the next 90 days, patient will: Focus on medication adherence by filling and taking medications appropriately  Take medications as prescribed Report any questions or concerns to PharmD and/or provider(s)  Please see past updates related to this goal by clicking on the "Past Updates" button in the selected goal          The patient verbalized understanding of instructions, educational materials, and care plan provided today and declined offer to receive copy of patient instructions, educational materials, and care plan.   Pharmacy team will check in with patient in 21 days to make sure Ozempic '2mg'$  received from PAP; Pharmacist follow up planned for 09/07/2021  Cherre Robins, PharmD Clinical Pharmacist [place] [facility]

## 2021-06-28 ENCOUNTER — Other Ambulatory Visit: Payer: Self-pay | Admitting: Family Medicine

## 2021-06-28 DIAGNOSIS — E785 Hyperlipidemia, unspecified: Secondary | ICD-10-CM

## 2021-06-28 DIAGNOSIS — F329 Major depressive disorder, single episode, unspecified: Secondary | ICD-10-CM

## 2021-06-29 ENCOUNTER — Other Ambulatory Visit (HOSPITAL_BASED_OUTPATIENT_CLINIC_OR_DEPARTMENT_OTHER): Payer: Self-pay | Admitting: Family Medicine

## 2021-06-29 DIAGNOSIS — Z1231 Encounter for screening mammogram for malignant neoplasm of breast: Secondary | ICD-10-CM

## 2021-07-05 ENCOUNTER — Telehealth: Payer: Self-pay | Admitting: Family Medicine

## 2021-07-05 DIAGNOSIS — E119 Type 2 diabetes mellitus without complications: Secondary | ICD-10-CM

## 2021-07-05 NOTE — Telephone Encounter (Signed)
Pt. Called in stating that prescription cant be filled at current pharmacy due to strength of medication. She wants to know can she come to downstairs pharmacy/office and get prescription?   RX.: Semaglutide, 2 MG/DOSE, (OZEMPIC, 2 MG/DOSE,) 8 MG/3ML SOPN

## 2021-07-06 ENCOUNTER — Other Ambulatory Visit (HOSPITAL_BASED_OUTPATIENT_CLINIC_OR_DEPARTMENT_OTHER): Payer: Self-pay

## 2021-07-06 MED ORDER — OZEMPIC (2 MG/DOSE) 8 MG/3ML ~~LOC~~ SOPN
2.0000 mg | PEN_INJECTOR | SUBCUTANEOUS | 3 refills | Status: DC
  Filled 2021-07-06: qty 9, 84d supply, fill #0
  Filled 2021-07-19: qty 3, 28d supply, fill #0

## 2021-07-06 NOTE — Telephone Encounter (Signed)
Medication sent to pharmacy downstairs.

## 2021-07-08 ENCOUNTER — Other Ambulatory Visit (HOSPITAL_BASED_OUTPATIENT_CLINIC_OR_DEPARTMENT_OTHER): Payer: Self-pay

## 2021-07-19 ENCOUNTER — Other Ambulatory Visit (HOSPITAL_BASED_OUTPATIENT_CLINIC_OR_DEPARTMENT_OTHER): Payer: Self-pay

## 2021-07-20 ENCOUNTER — Other Ambulatory Visit (HOSPITAL_BASED_OUTPATIENT_CLINIC_OR_DEPARTMENT_OTHER): Payer: Self-pay

## 2021-07-28 DIAGNOSIS — I129 Hypertensive chronic kidney disease with stage 1 through stage 4 chronic kidney disease, or unspecified chronic kidney disease: Secondary | ICD-10-CM | POA: Diagnosis not present

## 2021-07-28 DIAGNOSIS — Z23 Encounter for immunization: Secondary | ICD-10-CM | POA: Diagnosis not present

## 2021-07-28 DIAGNOSIS — N1832 Chronic kidney disease, stage 3b: Secondary | ICD-10-CM | POA: Diagnosis not present

## 2021-07-28 DIAGNOSIS — E1122 Type 2 diabetes mellitus with diabetic chronic kidney disease: Secondary | ICD-10-CM | POA: Diagnosis not present

## 2021-07-28 LAB — BASIC METABOLIC PANEL
BUN: 25 — AB (ref 4–21)
CO2: 30 — AB (ref 13–22)
Chloride: 101 (ref 99–108)
Creatinine: 1.5 — AB (ref 0.5–1.1)
Glucose: 134
Potassium: 4.9 (ref 3.4–5.3)
Sodium: 138 (ref 137–147)

## 2021-07-28 LAB — CBC AND DIFFERENTIAL: Hemoglobin: 12.8 (ref 12.0–16.0)

## 2021-07-28 LAB — IRON,TIBC AND FERRITIN PANEL: Ferritin: 29

## 2021-07-28 LAB — HEMOGLOBIN A1C: Hemoglobin A1C: 7.1

## 2021-07-28 LAB — COMPREHENSIVE METABOLIC PANEL
Calcium: 9.1 (ref 8.7–10.7)
GFR calc non Af Amer: 38

## 2021-08-02 ENCOUNTER — Ambulatory Visit (HOSPITAL_BASED_OUTPATIENT_CLINIC_OR_DEPARTMENT_OTHER): Payer: PPO

## 2021-08-04 ENCOUNTER — Telehealth: Payer: Self-pay

## 2021-08-04 NOTE — Telephone Encounter (Signed)
Called and Left vm to let pt know that her pt assistance is here in the refrigerator.

## 2021-08-09 ENCOUNTER — Encounter (HOSPITAL_BASED_OUTPATIENT_CLINIC_OR_DEPARTMENT_OTHER): Payer: Self-pay

## 2021-08-09 ENCOUNTER — Other Ambulatory Visit: Payer: Self-pay

## 2021-08-09 ENCOUNTER — Ambulatory Visit (HOSPITAL_BASED_OUTPATIENT_CLINIC_OR_DEPARTMENT_OTHER)
Admission: RE | Admit: 2021-08-09 | Discharge: 2021-08-09 | Disposition: A | Discharge status: Home / Self Care | Payer: PPO | Source: Ambulatory Visit | Attending: Family Medicine | Admitting: Family Medicine

## 2021-08-09 DIAGNOSIS — Z1231 Encounter for screening mammogram for malignant neoplasm of breast: Secondary | ICD-10-CM | POA: Diagnosis not present

## 2021-09-07 ENCOUNTER — Ambulatory Visit (INDEPENDENT_AMBULATORY_CARE_PROVIDER_SITE_OTHER): Payer: PPO | Admitting: Pharmacist

## 2021-09-07 DIAGNOSIS — E785 Hyperlipidemia, unspecified: Secondary | ICD-10-CM

## 2021-09-07 DIAGNOSIS — Z636 Dependent relative needing care at home: Secondary | ICD-10-CM

## 2021-09-07 DIAGNOSIS — E1169 Type 2 diabetes mellitus with other specified complication: Secondary | ICD-10-CM

## 2021-09-07 DIAGNOSIS — I10 Essential (primary) hypertension: Secondary | ICD-10-CM

## 2021-09-07 DIAGNOSIS — E119 Type 2 diabetes mellitus without complications: Secondary | ICD-10-CM

## 2021-09-07 NOTE — Patient Instructions (Signed)
Visit Information  Patient verbalizes understanding of instructions provided today and agrees to view in [place].   Telephone follow up appointment with care management team member scheduled for: 2 months  Cherre Robins, PharmD Clinical Pharmacist [place] [facility]

## 2021-09-07 NOTE — Chronic Care Management (AMB) (Signed)
Chronic Care Management Pharmacy Note  09/07/2021 Name:  Abigail [name] MRN:  [id number] DOB:  [birth year]  Summary:  Provided 1884 applications for Ozempic and Farxiga patient assistance Discussed needed vaccines Reminded to get yearly eye exam Coordinated refill for fluoxetine at patient's preferred pharmacy  Subjective: Abigail [name] is an 70 y.o. year old female who is a primary patient of Copland, Gay Filler, MD.  The CCM team was consulted for assistance with disease management and care coordination needs.    Engaged with patient by telephone for follow up visit in response to provider referral for pharmacy case management and/or care coordination services.   Consent to Services:  The patient was given information about Chronic Care Management services, agreed to services, and gave verbal consent prior to initiation of services.  Please see initial visit note for detailed documentation.   Patient Care Team: Copland, Gay Filler, MD as PCP - General (Family Medicine) Leonie Man, MD as PCP - Cardiology (Cardiology) Cherre Robins, PharmD (Pharmacist)  Recent office visits: 07/08/2021 - PCP (Dr Lorelei Pont) no changes in medications 04/08/2021 - PCP (Dr Lorelei Pont) Seen for neck pain and headache.  Recent consult visits: 05/03/2021 - Cardio (Dr Ellyn Hack) F/U HTN, hyperlipidemia and h/o chest pain. No med changes. Prescribed methocarbamol 524m. Increased Ozempic to 265mSQ weekly.   Hospital visits: None in previous 6 months  Objective:  Lab Results  Component Value Date   CREATININE 1.5 (A) 07/28/2021   CREATININE 1.6 (A) 03/31/2021   CREATININE 1.3 (A) 12/21/2020    Lab Results  Component Value Date   HGBA1C 7.1 07/28/2021   Last diabetic Eye exam:  Lab Results  Component Value Date/Time   HMDIABEYEEXA No Retinopathy 08/17/2020 12:00 AM    Last diabetic Foot exam: No results found for: HMDIABFOOTEX      Component Value Date/Time   CHOL 173 02/24/2020  1552   TRIG 199.0 (H) 02/24/2020 1552   HDL 56.60 02/24/2020 1552   CHOLHDL 3 02/24/2020 1552   VLDL 39.8 02/24/2020 1552   LDLCALC 76 02/24/2020 1552    Hepatic Function Latest Ref Rng & Units 03/31/2021 12/21/2020 12/18/2020  Total Protein 6.5 - 8.1 g/dL - - 6.6  Albumin 3.5 - 5.0 4.4 4.3 3.6  AST 15 - 41 U/L - - 20  ALT 0 - 44 U/L - - 17  Alk Phosphatase 38 - 126 U/L - - 70  Total Bilirubin 0.3 - 1.2 mg/dL - - 0.2(L)    Lab Results  Component Value Date/Time   TSH 2.16 02/24/2020 03:52 PM   TSH 1.45 01/02/2017 11:21 AM   FREET4 0.91 03/17/2016 11:07 AM    CBC Latest Ref Rng & Units 07/28/2021 03/31/2021 12/21/2020  WBC 4.0 - 10.5 K/uL - - -  Hemoglobin 12.0 - 16.0 12.8 12.9 12.1  Hematocrit 36.0 - 46.0 % - - -  Platelets 150 - 400 K/uL - - -    Lab Results  Component Value Date/Time   VD25OH 37.36 02/24/2020 03:52 PM    Clinical ASCVD: No  The 10-year ASCVD risk score (Arnett DK, et al., 2019) is: 23.5%   Values used to calculate the score:     Age: [age]     Sex: Female     Is Non-Hispanic African American: No     Diabetic: Yes     Tobacco smoker: No     Systolic Blood Pressure: 11166mHg     Is BP treated: Yes  HDL Cholesterol: 56.6 mg/dL     Total Cholesterol: 173 mg/dL     Social History   Tobacco Use  Smoking Status Never  Smokeless Tobacco Never   BP Readings from Last 3 Encounters:  05/03/21 118/70  04/08/21 118/72  03/15/21 132/75   Pulse Readings from Last 3 Encounters:  05/03/21 86  04/08/21 82  03/15/21 100   Wt Readings from Last 3 Encounters:  05/05/21 143 lb (64.9 kg)  05/03/21 145 lb (65.8 kg)  04/08/21 141 lb (64 kg)    Assessment: Review of patient past medical history, allergies, medications, health status, including review of consultants reports, laboratory and other test data, was performed as part of comprehensive evaluation and provision of chronic care management services.   SDOH:  (Social Determinants of Health)  assessments and interventions performed:  SDOH Interventions    Flowsheet Row Most Recent Value  SDOH Interventions   Financial Strain Interventions Other (Comment)  [getting Ozempic and Farxiga from [facility]]  Transportation Interventions Intervention Not Indicated       CCM Care Plan  Allergies  Allergen Reactions   Pneumovax 23 [Pneumococcal Vac Polyvalent] Other (See Comments)    Pt had severe arm pain   Amaryl [Glimepiride] Nausea Only   Cephalexin Hives   Sulfa Antibiotics Rash    Medications Reviewed Today     Reviewed by Cherre Robins, PharmD (Pharmacist) on 09/07/21 at 30  Med List Status: <None>   Medication Order Taking? Sig Documenting Provider Last Dose Status Informant  Ascorbic Acid (VITAMIN C) 1000 MG tablet [id number] Yes Take 1,000 mg by mouth daily. [provider] Taking Active   atorvastatin (LIPITOR) 20 MG tablet [id number] Yes TAKE ONE TABLET BY MOUTH ONCE DAILY Copland, Gay Filler, MD Taking Active   blood glucose meter kit and supplies [id number] Yes Dispense based on patient and insurance preference. Use up to four times daily as directed. (FOR ICD-10 E10.9, E11.9). Copland, Gay Filler, MD Taking Active   Blood Glucose Monitoring Suppl (ONE TOUCH ULTRA 2) w/Device KIT [id number] Yes by Does not apply route. [provider] Taking Active   FARXIGA 10 MG TABS tablet [id number] Yes Take 10 mg by mouth daily. [provider] Taking Active            Med Note Antony Contras, Sandre Kitty   Tue Sep 07, 2021 10:18 AM) 2022 - [place] and [place]   FLUoxetine (PROZAC) 20 MG capsule [id number] Yes Take 1 capsule (20 mg total) by mouth daily. Copland, Gay Filler, MD Taking Active   fluticasone (FLONASE) 50 MCG/ACT nasal spray [id number] Yes Place 2 sprays into both nostrils daily. Saguier, Percell Miller, PA-C Taking Active   glucose blood test strip [id number] Yes 1 each by Other route as needed for other. Use as instructed [provider]  Taking Active   losartan (COZAAR) 25 MG tablet [id number] Yes Take 1 tablet (25 mg total) by mouth daily. Copland, Gay Filler, MD Taking Active   methocarbamol (ROBAXIN) 500 MG tablet [id number] Yes Take 1 tablet (500 mg total) by mouth every 8 (eight) hours as needed for muscle spasms. Copland, Gay Filler, MD Taking Active   Multiple Vitamins-Minerals (ALIVE WOMENS GUMMY) CHEW [id number] Yes Chew 2 tablets by mouth daily. [provider] Taking Active Self  nitroGLYCERIN (NITROSTAT) 0.4 MG SL tablet [id number] Yes PLACE 1 TABLET UNDER THE TONGUE EVERY 5 MINUTES AS NEEDED FOR CHEST PAIN Copland, Gay Filler, MD Taking Active  Med Note [place]   Fri Jun 12, 2020  4:22 PM) Has not had to use, but has it just in case  omeprazole (PRILOSEC) 20 MG capsule [id number] Yes TAKE ONE CAPSULE BY MOUTH ONCE DAILY Copland, Gay Filler, MD Taking Active   OneTouch Delica Lancets 45G MISC [id number] Yes Use to check glucose up to 4 times daily. Dx Code E11.9 Copland, Gay Filler, MD Taking Active   propranolol (INDERAL) 10 MG tablet [id number] Yes TAKE 1 TABLET BY MOUTH EVERY 8-12 HOURS AS NEEDED FOR TREMOR Copland, Gay Filler, MD Taking Active   Semaglutide, 2 MG/DOSE, (OZEMPIC, 2 MG/DOSE,) 8 MG/3ML SOPN [id number] Yes Inject 2 mg into the skin once a week. Copland, Gay Filler, MD Taking Active            Med Note ([place], [place] Sep 07, 2021 10:19 AM) 2022 - [address] [employer] medication assistance program   traZODone (DESYREL) 150 MG tablet [id number] Yes TAKE ONE TABLET BY MOUTH AT bedtime Copland, Gay Filler, MD Taking Active   zinc gluconate 50 MG tablet [id number] Yes Take 50 mg by mouth daily. [provider] Taking Active             Patient Active Problem List   Diagnosis Date Noted   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/29/2020   Essential hypertension    Gastroesophageal reflux disease without esophagitis    Microscopic hematuria 09/16/2015   Trimalleolar fracture of ankle, closed 11/17/2014    Hyperlipidemia associated with type 2 diabetes mellitus ([place]) 07/24/2013   Chest pain 10/23/2012   Type 2 diabetes mellitus without complication, without long-term current use of insulin ([place]) 10/08/2012   Anxiety 10/08/2012    Immunization History  Administered Date(s) Administered   Fluad Quad(high Dose 65+) 07/01/2019   Influenza Whole 08/08/2013   Influenza, High Dose Seasonal PF 08/03/2016, 08/17/2017   Influenza, Seasonal, Injecte, Preservative Fre 10/08/2012   Influenza,inj,Quad PF,6+ Mos 08/05/2015   Influenza-Unspecified 08/08/2014, 08/13/2020, 07/31/2021   PFIZER(Purple Top)SARS-COV-2 Vaccination 01/13/2020, 02/12/2020, 08/13/2020   Pneumococcal Conjugate-13 11/03/2014   Pneumococcal Polysaccharide-23 02/10/2016   Zoster Recombinat (Shingrix) 09/13/2018    Conditions to be addressed/monitored: HTN, HLD, DMII, and GERD  Care Plan : General Pharmacy (Adult)  Updates made by Cherre Robins, PHARMD since 09/07/2021 12:00 AM     Problem: Chronic Disease Management support, education, and care coordination needs related to Diabetes, HTN, Hyperlipidemia, Chest Pain, Anxiety, Tremor, Insomnia, GERD   Priority: High  Onset Date: 01/25/2021     Long-Range Goal: Management of medicaiton and chronic conditions   Start Date: 01/25/2021  Recent Progress: On track  Priority: High  Note:   Current Barriers:  Unable to independently afford treatment regimen Chronic Disease Management support, education, and care coordination needs related to Diabetes, Hypertension, Hyperlipidemia, Chest Pain, Anxiety, Tremor, Insomnia, GERD  Pharmacist Clinical Goal(s):  Over the next 180 days, patient will achieve adherence to monitoring guidelines and medication adherence to achieve therapeutic efficacy Complete application for [place] and Ozempic  through collaboration with PharmD and provider.   Interventions: 1:1 collaboration with Copland, Gay Filler, MD regarding development and update of  comprehensive plan of care as evidenced by provider attestation and co-signature Inter-disciplinary care team collaboration (see longitudinal plan of care) Comprehensive medication review performed; medication list updated in electronic medical record   Hypertension Controlled; BP goal <140/90 Current regimen:  Losartan 91m daily Reviewed refill history - last filled 90 DS 07/08/2021  Pharmacist Interventions:  Maintain hypertension medication regimen.  Hyperlipidemia  At goal of LDL < 100 Current regimen:  Atorvastatin 69m daily Pharmacist Interventions Maintain current cholesterol medication regimen. Revived refill history - last filled atorvastatin 07/08/2021 for 90 DS  Diabetes Improving; A1c goal <7% Last A1c was 7.1% (07/2021) improved from 7.6% (May 2022) Current regimen:  Ozempic 237mweekly  Farxiga 101maily (added by Dr GolMoshe Cipror blood glucose and kidney function) Patient is receiving Ozempic from [employer] assistance. Received updated dose OZempib 2mg31mom patient assistance program 08/04/2021.  Receiving Farigra form [employer] assistance program Recent home BG readings: 130 to 200; Mostly in 150's Diet: avoid sugar containing beverages, limits in take of high carbohydrate foods such as pasta and breadl  Exercise: walks her dog twice a day (about 30 minutes total)  Pharmacist Interventions: Placed applications or Farxiga and Ozempic at front desk for patient to pick up later this week when she in in office with her mother.  Recommended she check blood glucose daily, document, and provide at future appointments Continue to limit intake of sugar and food high in carbohydrates.  Continue to exercise daily Reminded to get yearly eye exam ( patient has appointment scheduled for 12/2021 - soonest she could get)   Health Maintenance:  Reviewed vaccination history and discussed benefits of Shingrix, Tdap and COVID bivalent vaccinations Patient  to get the following vaccines in 2023 - Tdap Declined COVID bivalent booster Thinks she had full series of Shingrix - she will bring in copy of documentation or will check [place] database to verify.  Medication management Current pharmacy: [facility] Comprehensive medication review performed. Updated medication list.  Reviewed refill history and assessed adherence Continue to utilize [facility] pharmacy for medication synchronization, packaging and delivery [facility]ed [facility] to refill patient's fluoxetine. Plan is to fill enough until 10/07/2021 until can sync refill with other maintenance medications.   Patient Goals/Self-Care Activities Over the next 180 days, patient will:  take medications as prescribed and collaborate with provider on medication access solutions  Follow Up Plan: Telephone follow up appointment with clinical pharmacist in 2 months to check on 2023 patient assistance paperwork   Please see past updates related to this goal by clicking on the "Past Updates" button in the selected goal       Medication Assistance:  Farxiga and OzemPiersonained through [place] aSloan Me and [employer]ication assistance program.  Enrollment ends 12/340/98/1191lications placed at front desk for patient to pick up for 2023. Will follow up in 2 months.   Patient's preferred pharmacy is:  [facility] [address]: [phone]: [phone] [place] [id number] [place]: [phone]: 336-[place] -[place] [place] [place]42[address]ne: [phone]: [phone] [address] -[place]ne: [phone]: [phone] [address]8315ne: [phone]: 336-[place]ne: [phone]: [phone] Up:  Patient agrees to Care Plan and Follow-up.  Plan: Telephone follow  up appointment with care management team member scheduled for:  2 months  Cherre Robins, PharmD Clinical Pharmacist [place] [facility]

## 2021-09-16 ENCOUNTER — Other Ambulatory Visit: Payer: Self-pay | Admitting: Family Medicine

## 2021-09-16 ENCOUNTER — Telehealth: Payer: Self-pay | Admitting: Family Medicine

## 2021-09-16 ENCOUNTER — Other Ambulatory Visit: Payer: Self-pay

## 2021-09-16 DIAGNOSIS — G250 Essential tremor: Secondary | ICD-10-CM

## 2021-09-16 MED ORDER — PROPRANOLOL HCL 10 MG PO TABS: ORAL_TABLET | ORAL | 0 refills | Status: DC

## 2021-09-16 NOTE — Telephone Encounter (Signed)
Pt called to inform us that [facility] will be sending a prescription request. But if we can do it sooner that will be great. Pt is out of meds.    propranolol (INDERAL) 10 MG tablet //  [facility] - [place], [place] - [address]  [address], [place] [address]  Phone:  [phone]  Fax:  [phone]

## 2021-09-16 NOTE — Telephone Encounter (Signed)
Rx sent 

## 2021-10-04 ENCOUNTER — Telehealth: Payer: Self-pay | Admitting: Pharmacist

## 2021-10-04 NOTE — Progress Notes (Signed)
Chronic Care Management Pharmacy Assistant   Name: Abigail [name]  MRN: [id number] DOB: 1950/12/24  Reason for Encounter: Medication Review    Recent office visits:  None ID  Recent consult visits:  None ID  Hospital visits:  None in previous 6 months  Medications: Outpatient Encounter Medications as of 10/04/2021  Medication Sig Note   Ascorbic Acid (VITAMIN C) 1000 MG tablet Take 1,000 mg by mouth daily.    atorvastatin (LIPITOR) 20 MG tablet TAKE ONE TABLET BY MOUTH ONCE DAILY    blood glucose meter kit and supplies Dispense based on patient and insurance preference. Use up to four times daily as directed. (FOR ICD-10 E10.9, E11.9).    Blood Glucose Monitoring Suppl (ONE TOUCH ULTRA 2) w/Device KIT by Does not apply route.    FARXIGA 10 MG TABS tablet Take 10 mg by mouth daily. 09/07/2021: 2022 - [place]    FLUoxetine (PROZAC) 20 MG capsule Take 1 capsule (20 mg total) by mouth daily.    fluticasone (FLONASE) 50 MCG/ACT nasal spray Place 2 sprays into both nostrils daily.    glucose blood test strip 1 each by Other route as needed for other. Use as instructed    losartan (COZAAR) 25 MG tablet Take 1 tablet (25 mg total) by mouth daily.    methocarbamol (ROBAXIN) 500 MG tablet Take 1 tablet (500 mg total) by mouth every 8 (eight) hours as needed for muscle spasms.    Multiple Vitamins-Minerals (ALIVE WOMENS GUMMY) CHEW Chew 2 tablets by mouth daily.    nitroGLYCERIN (NITROSTAT) 0.4 MG SL tablet PLACE 1 TABLET UNDER THE TONGUE EVERY 5 MINUTES AS NEEDED FOR CHEST PAIN 06/12/2020: Has not had to use, but has it just in case   omeprazole (PRILOSEC) 20 MG capsule TAKE ONE CAPSULE BY MOUTH ONCE DAILY    OneTouch Delica Lancets 78I MISC Use to check glucose up to 4 times daily. Dx Code E11.9    propranolol (INDERAL) 10 MG tablet TAKE ONE TABLET BY MOUTH EVERY EIGHT TO TWELVE HOURS AS NEEDED FOR tremors    Semaglutide, 2 MG/DOSE, (OZEMPIC, 2 MG/DOSE,)  8 MG/3ML SOPN Inject 2 mg into the skin once a week. 09/07/2021: 2022 - [employer] medication assistance program    traZODone (DESYREL) 150 MG tablet TAKE ONE TABLET BY MOUTH AT bedtime    zinc gluconate 50 MG tablet Take 50 mg by mouth daily.    No facility-administered encounter medications on file as of 10/04/2021.    Reviewed chart for medication changes ahead of medication coordination call.  No OVs, Consults, or hospital visits since last care coordination call/Pharmacist visit. (If appropriate, list visit date, provider name)  No medication changes indicated OR if recent visit, treatment plan here.  BP Readings from Last 3 Encounters:  05/03/21 118/70  04/08/21 118/72  03/15/21 132/75    Lab Results  Component Value Date   HGBA1C 7.1 07/28/2021     Patient obtains medications through V[facility]  90 Days   Last adherence delivery included:  Fluoxetine 20 mg 1 cap daily-last fill 09/07/21 39 ds Trazodone 150 mg 1 tab at bedtime-last fill 07/08/21 90 ds Losartan 25 mg 1 tab daily-last fill 07/08/21 90 ds Atorvastatin 20 mg 1 tab daily-last fill 07/08/21 90 ds Propranolol 10 mg 1 tab every 8 to 12 hours as needed-last fill 11/14   Patient is due for next adherence delivery on: 10/11/21.  Reviewed chart for medication changes ahead of medication coordination  call. This delivery to include: Fluoxetine 20 mg 1 cap daily Trazodone 150 mg 1 tab at bedtime Losartan 25 mg 1 tab daily Atorvastatin 20 mg 1 tab daily Propranolol 10 mg 1 tab every 8 to 12 hours as needed  Patient needs refills for none noted.  Attempted to contact patient x 2 for medication review and delivery, unable to reach patient, left voicemails to return call.  Tentatively scheduled delivery of medications for 10/15/21 pending pharmacy's ability to verify with patient.   Star Rating Drugs: Losartan 25 mg-last fill 07/08/21 90 ds Atorvastatin 20 mg-last fill 07/08/21 90 ds  Ethelene Hal Clinical Pharmacist  Assistant [phone]

## 2021-10-06 DIAGNOSIS — I10 Essential (primary) hypertension: Secondary | ICD-10-CM

## 2021-10-06 DIAGNOSIS — E785 Hyperlipidemia, unspecified: Secondary | ICD-10-CM | POA: Diagnosis not present

## 2021-10-06 DIAGNOSIS — E1169 Type 2 diabetes mellitus with other specified complication: Secondary | ICD-10-CM | POA: Diagnosis not present

## 2021-10-06 DIAGNOSIS — Z7985 Long-term (current) use of injectable non-insulin antidiabetic drugs: Secondary | ICD-10-CM | POA: Diagnosis not present

## 2021-10-08 NOTE — Telephone Encounter (Signed)
Left message on patient's VM to see if she has been able to get in touch with [facility] to request needed prescription.  LM on VM with my contact number [phone] and contact number for [facility].

## 2021-10-13 ENCOUNTER — Telehealth: Payer: Self-pay | Admitting: Pharmacist

## 2021-10-13 MED ORDER — FARXIGA 10 MG PO TABS: 10.0000 mg | ORAL_TABLET | Freq: Every day | ORAL | 3 refills | Status: DC

## 2021-10-13 NOTE — Telephone Encounter (Signed)
Sent prescription to [facility] for Farxiga 10mg  daily for 2023 patient assistance. Patient has Chronic Care Management follow up scheduled for January 2023.

## 2021-10-20 ENCOUNTER — Telehealth: Payer: Self-pay | Admitting: *Deleted

## 2021-10-20 NOTE — Telephone Encounter (Signed)
Reorder request mailed to [place] from [employer] for [employer].  Request faxed back.

## 2021-11-12 ENCOUNTER — Ambulatory Visit (INDEPENDENT_AMBULATORY_CARE_PROVIDER_SITE_OTHER): Payer: PPO | Admitting: Pharmacist

## 2021-11-12 ENCOUNTER — Telehealth: Payer: Self-pay | Admitting: *Deleted

## 2021-11-12 DIAGNOSIS — I10 Essential (primary) hypertension: Secondary | ICD-10-CM

## 2021-11-12 DIAGNOSIS — E1169 Type 2 diabetes mellitus with other specified complication: Secondary | ICD-10-CM

## 2021-11-12 DIAGNOSIS — N1832 Chronic kidney disease, stage 3b: Secondary | ICD-10-CM

## 2021-11-12 DIAGNOSIS — E119 Type 2 diabetes mellitus without complications: Secondary | ICD-10-CM

## 2021-11-12 NOTE — Patient Instructions (Addendum)
Abigail [name] It was a pleasure speaking with you today.  I have attached a summary of our visit today and information about your health goals.   Our next appointment is by telephone on Monday, December 06, 2020 at 2:30pm  Please call the care guide team at [phone] if you need to cancel or reschedule your appointment.     If you have any questions or concerns, please feel free to contact me either at the phone number below or with a MyChart message.   Keep up the good work!  Cherre Robins, PharmD Clinical Pharmacist [facility] [phone] (direct line)  [phone] (main office number)   CARE PLAN ENTRY (see longitudinal plan of care for additional care plan information)  Current Barriers:  Chronic Disease Management support, education, and care coordination needs related to Diabetes, Hypertension, Hyperlipidemia, Chest Pain, Anxiety, Tremor, Insomnia, GERD   Hypertension BP Readings from Last 3 Encounters:  05/03/21 118/70  04/08/21 118/72  03/15/21 132/75   Pharmacist Clinical Goal(s): Over the next 90 days, patient will work with PharmD and providers to maintain BP goal <140/90 Current regimen:  Losartan 25mg  daily Patient self care activities - Over the next 90 days, patient will: Maintain hypertension medication regimen.  Hyperlipidemia Lab Results  Component Value Date/Time   [facility] 76 02/24/2020 03:52 PM   Pharmacist Clinical Goal(s): Over the next 90 days, patient will work with PharmD and providers to maintain LDL goal < 100 Current regimen:  Atorvastatin 20mg  daily Patient self care activities - Over the next 90 days, patient will: Maintain cholesterol medication regimen.  Diabetes Lab Results  Component Value Date/Time   HGBA1C 7.1 07/28/2021 12:00 AM   HGBA1C 7.6 03/31/2021 12:00 AM   Pharmacist Clinical Goal(s): Over the next 90 days, patient will work with PharmD and providers to achieve A1c goal  <7% Current regimen:  Ozempic 2mg  weekly Farxiga 10mg  daily (added by Dr Moshe Cipro for blood glucose and kidney function) Interventions: Mailed patient application for Ozempic for 2023.  Patient self care activities - Over the next 90 days, patient will: Check blood sugar once daily, document, and provide at future appointments Contact provider with any episodes of hypoglycemia Complete application for Ozempic (mailed to patient today 11/12/2021)   Health Maintenance:  Reviewed vaccination history and discussed benefits of Shingrix, Tdap and COVID bivalent vaccinations Patient to get the following vaccines in 2023 - Tdap and Shingrix Patient is due follow up with Dr Lorelei Pont - appointment made today for 11/29/2021  Medication management Pharmacist Clinical Goal(s): Over the next 90 days, patient will work with PharmD and providers to achieve optimal medication adherence Current pharmacy: [facility] Comprehensive medication review performed. Utilize [facility] for medication synchronization, packaging and delivery Requested refill for omeprazole from pharmacy. Will be delivered next week.    Patient self care activities - Over the next 90 days, patient will: Focus on medication adherence by filling and taking medications appropriately  Take medications as prescribed Report any questions or concerns to PharmD and/or provider(s) Come for appointment with Dr Lorelei Pont 11/29/2021 Patient verbalizes understanding of instructions provided today and agrees to view in [place].

## 2021-11-12 NOTE — Chronic Care Management (AMB) (Signed)
Chronic Care Management Pharmacy Note  11/12/2021 Name:  Abigail [name] MRN:  [id number] DOB:  April 07, 1951  Summary:  Made follow up appointment with Dr Lorelei Pont for 11/29/2021 Checked on request from 10/2021 for Ozempic refill form [employer] patient assistance program - in process. [facility] has had shipping  / supply issues and they are behind. Patient report she has 8 weeks of Ozempic on hand.  Mailed application for Ozempic patient assistance program to patient today - she will complete and return to office at her appt 11/29/2021 Reminded to get yearly eye exam ( patient has appointment scheduled for 12/2021 - soonest she could get)   Subjective: Abigail [name] is an 71 y.o. year old female who is a primary patient of Copland, Gay Filler, MD.  The CCM team was consulted for assistance with disease management and care coordination needs.    Engaged with patient by telephone for follow up visit in response to provider referral for pharmacy case management and/or care coordination services.   Consent to Services:  The patient was given information about Chronic Care Management services, agreed to services, and gave verbal consent prior to initiation of services.  Please see initial visit note for detailed documentation.   Patient Care Team: Copland, Gay Filler, MD as PCP - General (Family Medicine) Leonie Man, MD as PCP - Cardiology (Cardiology) Cherre Robins, RPH-CPP (Pharmacist)  Recent office visits: 07/08/2021 - PCP (Dr Lorelei Pont) no changes in medications 04/08/2021 - PCP (Dr Lorelei Pont) Seen for neck pain and headache.  Recent consult visits: 07/2021 - Nephrologist (Dr Moshe Cipro)  05/03/2021 - Cardio (Dr Ellyn Hack) F/U HTN, hyperlipidemia and h/o chest pain. No med changes. Prescribed methocarbamol 541m. Increased Ozempic to 210mSQ weekly.   Hospital visits: None in previous 6 months  Objective:  Lab Results  Component Value Date   CREATININE 1.5 (A) 07/28/2021    CREATININE 1.6 (A) 03/31/2021   CREATININE 1.3 (A) 12/21/2020    Lab Results  Component Value Date   HGBA1C 7.1 07/28/2021   Last diabetic Eye exam:  Lab Results  Component Value Date/Time   HMDIABEYEEXA No Retinopathy 08/17/2020 12:00 AM    Last diabetic Foot exam: No results found for: HMDIABFOOTEX      Component Value Date/Time   CHOL 173 02/24/2020 1552   TRIG 199.0 (H) 02/24/2020 1552   HDL 56.60 02/24/2020 1552   CHOLHDL 3 02/24/2020 1552   VLDL 39.8 02/24/2020 1552   LDLCALC 76 02/24/2020 1552    Hepatic Function Latest Ref Rng & Units 03/31/2021 12/21/2020 12/18/2020  Total Protein 6.5 - 8.1 g/dL - - 6.6  Albumin 3.5 - 5.0 4.4 4.3 3.6  AST 15 - 41 U/L - - 20  ALT 0 - 44 U/L - - 17  Alk Phosphatase 38 - 126 U/L - - 70  Total Bilirubin 0.3 - 1.2 mg/dL - - 0.2(L)    Lab Results  Component Value Date/Time   TSH 2.16 02/24/2020 03:52 PM   TSH 1.45 01/02/2017 11:21 AM   FREET4 0.91 03/17/2016 11:07 AM    CBC Latest Ref Rng & Units 07/28/2021 03/31/2021 12/21/2020  WBC 4.0 - 10.5 K/uL - - -  Hemoglobin 12.0 - 16.0 12.8 12.9 12.1  Hematocrit 36.0 - 46.0 % - - -  Platelets 150 - 400 K/uL - - -    Lab Results  Component Value Date/Time   VD25OH 37.36 02/24/2020 03:52 PM    Clinical ASCVD: No  The 10-year ASCVD risk score (Arnett  DK, et al., 2019) is: 23.5%   Values used to calculate the score:     Age: 35 years     Sex: Female     Is Non-Hispanic African American: No     Diabetic: Yes     Tobacco smoker: No     Systolic Blood Pressure: 250 mmHg     Is BP treated: Yes     HDL Cholesterol: 56.6 mg/dL     Total Cholesterol: 173 mg/dL     Social History   Tobacco Use  Smoking Status Never  Smokeless Tobacco Never   BP Readings from Last 3 Encounters:  05/03/21 118/70  04/08/21 118/72  03/15/21 132/75   Pulse Readings from Last 3 Encounters:  05/03/21 86  04/08/21 82  03/15/21 100   Wt Readings from Last 3 Encounters:  05/05/21 143 lb (64.9 kg)   05/03/21 145 lb (65.8 kg)  04/08/21 141 lb (64 kg)    Assessment: Review of patient past medical history, allergies, medications, health status, including review of consultants reports, laboratory and other test data, was performed as part of comprehensive evaluation and provision of chronic care management services.   SDOH:  (Social Determinants of Health) assessments and interventions performed:  SDOH Interventions    Flowsheet Row Most Recent Value  SDOH Interventions   Financial Strain Interventions Other (Comment)  [assisting with application for PAP for 2023 for Ozempic and [place]  Allergies  Allergen Reactions   Pneumovax 23 [Pneumococcal Vac Polyvalent] Other (See Comments)    Pt had severe arm pain   Amaryl [Glimepiride] Nausea Only   Cephalexin Hives   Sulfa Antibiotics Rash    Medications Reviewed Today     Reviewed by Cherre Robins, RPH-CPP (Pharmacist) on 11/12/21 at 1010  Med List Status: <None>   Medication Order Taking? Sig Documenting Provider Last Dose Status Informant  Ascorbic Acid (VITAMIN C) 1000 MG tablet [id number] Yes Take 1,000 mg by mouth daily. [provider] Taking Active   atorvastatin (LIPITOR) 20 MG tablet [id number] Yes TAKE ONE TABLET BY MOUTH ONCE DAILY Copland, Gay Filler, MD Taking Active   B Complex-C (SUPER B COMPLEX PO) [id number] Yes Take 1 tablet by mouth daily. [provider] Taking Active   blood glucose meter kit and supplies [id number] Yes Dispense based on patient and insurance preference. Use up to four times daily as directed. (FOR ICD-10 E10.9, E11.9). Copland, Gay Filler, MD Taking Active   Blood Glucose Monitoring Suppl (ONE TOUCH ULTRA 2) w/Device KIT [id number] Yes by Does not apply route. [provider] Taking Active   Cholecalciferol (VITAMIN D3) 50 MCG (2000 UT) CAPS [id number] Yes Take 1 capsule by mouth daily. [provider] Taking Active   FARXIGA 10 MG TABS tablet  [id number] Yes Take 1 tablet (10 mg total) by mouth daily. For [place] and [place] program Copland, Gay Filler, MD Taking Active   FLUoxetine (PROZAC) 20 MG capsule [id number] Yes Take 1 capsule (20 mg total) by mouth daily. Copland, Gay Filler, MD Taking Active   fluticasone (FLONASE) 50 MCG/ACT nasal spray [id number] Yes Place 2 sprays into both nostrils daily. [place], Percell Miller, PA-C Taking Active   glucose blood test strip [id number] Yes 1 each by Other route as needed for other. Use as instructed [provider] Taking Active   losartan (COZAAR) 25 MG tablet [id number] Yes Take 1 tablet (25 mg total) by mouth daily. Copland, Gay Filler, MD Taking Active  methocarbamol (ROBAXIN) 500 MG tablet [id number] No Take 1 tablet (500 mg total) by mouth every 8 (eight) hours as needed for muscle spasms.  Patient not taking: Reported on 11/12/2021   Copland, Gay Filler, MD Not Taking Active   Multiple Vitamins-Minerals (ALIVE WOMENS 50+) Sheral Flow [id number] Yes Take 1 tablet by mouth daily. [provider] Taking Active   nitroGLYCERIN (NITROSTAT) 0.4 MG SL tablet [id number]  PLACE 1 TABLET UNDER THE TONGUE EVERY 5 MINUTES AS NEEDED FOR CHEST PAIN Copland, Gay Filler, MD  Active            Med Note [place]   Fri Jun 12, 2020  4:22 PM) Has not had to use, but has it just in case  omeprazole (PRILOSEC) 20 MG capsule [id number] Yes TAKE ONE CAPSULE BY MOUTH ONCE DAILY Copland, Gay Filler, MD Taking Active   OneTouch Delica Lancets 78H MISC [id number] Yes Use to check glucose up to 4 times daily. Dx Code E11.9 Copland, Gay Filler, MD Taking Active   propranolol (INDERAL) 10 MG tablet [id number] Yes TAKE ONE TABLET BY MOUTH EVERY EIGHT TO TWELVE HOURS AS NEEDED FOR tremors Copland, Gay Filler, MD Taking Active   Semaglutide, 2 MG/DOSE, (OZEMPIC, 2 MG/DOSE,) 8 MG/3ML SOPN [id number] Yes Inject 2 mg into the skin once a week. Copland, Gay Filler, MD Taking Active            Med Note ([place], [place] Sep 07, 2021  10:19 AM) 2022 - [address] [place] medication assistance program   traZODone (DESYREL) 150 MG tablet [id number] Yes TAKE ONE TABLET BY MOUTH AT bedtime Copland, Gay Filler, MD Taking Active   zinc gluconate 50 MG tablet [id number] Yes Take 50 mg by mouth daily. [provider] Taking Active             Patient Active Problem List   Diagnosis Date Noted   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/29/2020   Essential hypertension    Gastroesophageal reflux disease without esophagitis    Microscopic hematuria 09/16/2015   Trimalleolar fracture of ankle, closed 11/17/2014   Hyperlipidemia associated with type 2 diabetes mellitus ([place]) 07/24/2013   Chest pain 10/23/2012   Type 2 diabetes mellitus without complication, without long-term current use of insulin ([place]) 10/08/2012   Anxiety 10/08/2012    Immunization History  Administered Date(s) Administered   Fluad Quad(high Dose 65+) 07/01/2019   Influenza Whole 08/08/2013   Influenza, High Dose Seasonal PF 08/03/2016, 08/17/2017   Influenza, Seasonal, Injecte, Preservative Fre 10/08/2012   Influenza,inj,Quad PF,6+ Mos 08/05/2015   Influenza-Unspecified 08/08/2014, 08/13/2020, 07/31/2021   PFIZER(Purple Top)SARS-COV-2 Vaccination 01/13/2020, 02/12/2020, 08/13/2020   Pneumococcal Conjugate-13 11/03/2014   Pneumococcal Polysaccharide-23 02/10/2016   Zoster Recombinat (Shingrix) 09/13/2018    Conditions to be addressed/monitored: HTN, HLD, DMII, and GERD  Care Plan : General Pharmacy (Adult)  Updates made by Cherre Robins, RPH-CPP since 11/12/2021 12:00 AM     Problem: Chronic Disease Management support, education, and care coordination needs related to Diabetes, HTN, Hyperlipidemia, Chest Pain, Anxiety, Tremor, Insomnia, GERD   Priority: High  Onset Date: 01/25/2021     Long-Range Goal: Management of medicaiton and chronic conditions   Start Date: 01/25/2021  Recent Progress: On track  Priority: High  Note:   Current Barriers:  Unable to  independently afford treatment regimen Chronic Disease Management support, education, and care coordination needs related to Diabetes, Hypertension, Hyperlipidemia, Chest Pain, Anxiety, Tremor, Insomnia, GERD  Pharmacist Clinical Goal(s):  Over the next 180 days, patient  will achieve adherence to monitoring guidelines and medication adherence to achieve therapeutic efficacy Complete application for [place] and Ozempic  through collaboration with PharmD and provider.   Interventions: 1:1 collaboration with Copland, Gay Filler, MD regarding development and update of comprehensive plan of care as evidenced by provider attestation and co-signature Inter-disciplinary care team collaboration (see longitudinal plan of care) Comprehensive medication review performed; medication list updated in electronic medical record   Hypertension Controlled; BP goal <140/90 Current regimen:  Losartan 33m daily Reviewed refill history - last filled 90 DS 10/01/2021 Pharmacist Interventions:  Maintain hypertension medication regimen Discussed blood pressure goal  Hyperlipidemia At goal of LDL < 100 Current regimen:  Atorvastatin 213mdaily Pharmacist Interventions Maintain current cholesterol medication regimen. Revived refill history - last filled atorvastatin 10/04/2021 for 90 DS  Diabetes Improving; A1c goal <7% Last A1c was 7.1% (07/2021) improved from 7.6% (May 2022) Current regimen:  Ozempic 57m48meekly  Farxiga 71m36mily (added by Dr GoldMoshe Cipro blood glucose and kidney function) Patient is received Ozempic from [employer] assistance in 2022. She report she currently has about 8 weeks supply on hand.  Receiving Farigra form [employer] assistance program - has been re-enrolled in thru 11/06/2022 Recent home BG readings: 130 to 200; Mostly in 140's.  Blood glucose today was 147 Diet: avoiding sugar containing beverages, limits intake of high carbohydrate foods such as  pasta and bread Exercise: walks her dog twice a day (about 30 minutes total)  Pharmacist Interventions: Mailed application for Ozempic patient assistance program to patient today Recommended she continue to check blood glucose daily, document, and provide at future appointments Continue to limit intake of sugar and food high in carbohydrates.  Continue to exercise daily Reminded to get yearly eye exam ( patient has appointment scheduled for 12/2021 - soonest she could get)   Health Maintenance:  Reviewed vaccination history and discussed benefits of Shingrix, Tdap and COVID bivalent vaccinations Patient to get the following vaccines in 2023 - Tdap and Shingrix Declined COVID bivalent booster Patient is due for f/u with PCP - appointment made today for 11/29/2021  Medication management Current pharmacy: [facility] Interventions Comprehensive medication review performed. Updated medication list.  Reviewed refill history and assessed adherence Continue to utilize [facility] pharmacy for medication synchronization, packaging and delivery Contacted [facility] to refill patient's omeprazole.   Patient Goals/Self-Care Activities Over the next 180 days, patient will:  take medications as prescribed and collaborate with provider on medication access solutions  Follow Up Plan: Telephone follow up appointment with clinical pharmacist in 1 month to check on 2023 patient assistance paperwork   Please see past updates related to this goal by clicking on the "Past Updates" button in the selected goal       Medication Assistance:  [facility] [place] [facility].  Enrollment ends 12/329/79/8921lication for 20231941ient assistance program from [employer] mailed to patient today  Patient's preferred pharmacy is:  [facility] [facility] [place]:  [phone]: [phone] [place] [id number] [place]: [phone]: [address]-[place] -[place] [address] [place]ne: [phone]: [phone] -  [place], [place] [address] Phone: [phone] Fax: [phone]  [facility] [place] [place] [place] [address] [facility] [address] Phone: [phone] Fax: [place] [address] [place] [address] Phone: [phone] Fax: [place], [place] [address] [place] [address] Phone: [phone] Fax: [phone]    Follow Up:  Patient agrees to Care Plan and Follow-up.  Plan: Telephone follow up appointment with care management team member scheduled for:  1 month  Cherre Robins, PharmD Clinical Pharmacist [facility]

## 2021-11-12 NOTE — Telephone Encounter (Deleted)
Prior auth started via cover my meds.  Awaiting determination.   Key: Abigail [name]

## 2021-11-12 NOTE — Telephone Encounter (Signed)
ERROR

## 2021-11-23 NOTE — Progress Notes (Deleted)
[place] at [facility] [address], [place], [place] [address] [phone]  Date:  11/29/2021   Name:  Abigail [name]   DOB:  26-Nov-1950   MRN:  [id number]  PCP:  Darreld Mclean, MD    Chief Complaint: No chief complaint on file.   History of Present Illness:  Abigail [name] is a 71 y.o. very pleasant female patient who presents with the following:  [name] is here today for follow-up of chronic health conditions Most recent visit with myself was in June History of controlled diabetes, hypertension, chronic renal insufficiency-she is seeing nephrology, hyperlipidemia Most recent nephrology visit was in September, [place]   At that time creatinine was 1.8, GFR 38 A1c had improved to 7.1%  Shingles vaccine-she did have Zostavax in 2019 COVID booster Foot exam is due Can update tetanus Eye exam due Mammogram up-to-date Can offer bone density Colonoscopy up-to-date  Lipitor Farxiga 10 Fluoxetine 20 Losartan 25 Prilosec Propranolol as needed tremors Ozempic 2 mg Trazodone 150 at bedtime  Patient Active Problem List   Diagnosis Date Noted   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/29/2020   Essential hypertension    Gastroesophageal reflux disease without esophagitis    Microscopic hematuria 09/16/2015   Trimalleolar fracture of ankle, closed 11/17/2014   Hyperlipidemia associated with type 2 diabetes mellitus ([place]) 07/24/2013   Chest pain 10/23/2012   Type 2 diabetes mellitus without complication, without long-term current use of insulin ([place]) 10/08/2012   Anxiety 10/08/2012    Past Medical History:  Diagnosis Date   Anemia, unspecified    Anxiety    CKD (chronic kidney disease), stage III ([place])    Depression    Diabetes mellitus type II, non insulin dependent ([place])    On Ozempic and Farxiga   GERD (gastroesophageal reflux disease)    Hematuria    Hyperlipidemia    Hypertension    Mild mitral regurgitation  08/2017   Essentially normal-mild MR on echo.   Normal coronary arteries    Coronary CTA: No evidence of CAD.  Coronary calcium score 0.   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    Past Surgical History:  Procedure Laterality Date   CHOLECYSTECTOMY N/A 06/03/2015   Procedure: LAPAROSCOPIC CHOLECYSTECTOMY WITH INTRAOPERATIVE CHOLANGIOGRAM;  Surgeon: Donnie Mesa, MD;  Location: [facility];  Service: General;  Laterality: N/A;   COLONOSCOPY     HAMMER TOE SURGERY     ORIF ANKLE FRACTURE Left 11/17/2014   Procedure: OPEN REDUCTION INTERNAL FIXATION (ORIF) LEFT TRIMALLEOLAR ANKLE FRACTURE;  Surgeon: Marianna Payment, MD;  Location: [facility];  Service: Orthopedics;  Laterality: Left;   OVARIAN CYST REMOVAL      Social History   Tobacco Use   Smoking status: Never   Smokeless tobacco: Never  Vaping Use   Vaping Use: Never used  Substance Use Topics   Alcohol use: Yes    Alcohol/week: 1.0 standard drink    Types: 1 Glasses of wine per week   Drug use: No    Family History  Problem Relation Age of Onset   Diabetes Mother    Hyperlipidemia Mother    Diabetes Father    Heart disease Father 32       AMI x 2; first AMI age 76   Stroke Father    Hypertension Father    Hyperlipidemia Brother    AAA (abdominal aortic aneurysm) Brother    Congestive Heart Failure Brother  s/p ICD   Coronary artery disease Brother        defibrillator; CHF   Kidney cancer Brother        Status post nephrectomy   Other Sister 42       Died in a car accident    Allergies  Allergen Reactions   Pneumovax 23 [Pneumococcal Vac Polyvalent] Other (See Comments)    Pt had severe arm pain   Amaryl [Glimepiride] Nausea Only   Cephalexin Hives   Sulfa Antibiotics Rash    Medication list has been reviewed and updated.  Current Outpatient Medications on File Prior to Visit  Medication Sig Dispense Refill   Ascorbic Acid (VITAMIN C) 1000 MG tablet Take 1,000 mg by mouth daily.      atorvastatin (LIPITOR) 20 MG tablet TAKE ONE TABLET BY MOUTH ONCE DAILY 90 tablet 3   B Complex-C (SUPER B COMPLEX PO) Take 1 tablet by mouth daily.     blood glucose meter kit and supplies Dispense based on patient and insurance preference. Use up to four times daily as directed. (FOR ICD-10 E10.9, E11.9). 1 each 0   Blood Glucose Monitoring Suppl (ONE TOUCH ULTRA 2) w/Device KIT by Does not apply route.     Cholecalciferol (VITAMIN D3) 50 MCG (2000 UT) CAPS Take 1 capsule by mouth daily.     FARXIGA 10 MG TABS tablet Take 1 tablet (10 mg total) by mouth daily. For [place] and [place] assistance program 90 tablet 3   FLUoxetine (PROZAC) 20 MG capsule Take 1 capsule (20 mg total) by mouth daily. 90 capsule 3   fluticasone (FLONASE) 50 MCG/ACT nasal spray Place 2 sprays into both nostrils daily. 16 g 1   glucose blood test strip 1 each by Other route as needed for other. Use as instructed     losartan (COZAAR) 25 MG tablet Take 1 tablet (25 mg total) by mouth daily. 90 tablet 3   methocarbamol (ROBAXIN) 500 MG tablet Take 1 tablet (500 mg total) by mouth every 8 (eight) hours as needed for muscle spasms. (Patient not taking: Reported on 11/12/2021) 30 tablet 0   Multiple Vitamins-Minerals (ALIVE WOMENS 50+) TABS Take 1 tablet by mouth daily.     nitroGLYCERIN (NITROSTAT) 0.4 MG SL tablet PLACE 1 TABLET UNDER THE TONGUE EVERY 5 MINUTES AS NEEDED FOR CHEST PAIN 25 tablet 2   omeprazole (PRILOSEC) 20 MG capsule TAKE ONE CAPSULE BY MOUTH ONCE DAILY 90 capsule 3   OneTouch Delica Lancets 33A MISC Use to check glucose up to 4 times daily. Dx Code E11.9 400 each 1   propranolol (INDERAL) 10 MG tablet TAKE ONE TABLET BY MOUTH EVERY EIGHT TO TWELVE HOURS AS NEEDED FOR tremors 270 tablet 0   Semaglutide, 2 MG/DOSE, (OZEMPIC, 2 MG/DOSE,) 8 MG/3ML SOPN Inject 2 mg into the skin once a week. 24 mL 3   traZODone (DESYREL) 150 MG tablet TAKE ONE TABLET BY MOUTH AT bedtime 90 tablet 3   zinc gluconate 50 MG tablet Take 50 mg  by mouth daily.     No current facility-administered medications on file prior to visit.    Review of Systems:  As per HPI- otherwise negative.   Physical Examination: There were no vitals filed for this visit. There were no vitals filed for this visit. There is no height or weight on file to calculate BMI. Ideal Body Weight:    GEN: no acute distress. HEENT: Atraumatic, Normocephalic.  Ears and Nose: No external deformity. CV: RRR,  No M/G/R. No JVD. No thrill. No extra heart sounds. PULM: CTA B, no wheezes, crackles, rhonchi. No retractions. No resp. distress. No accessory muscle use. ABD: S, NT, ND, +BS. No rebound. No HSM. EXTR: No c/c/e PSYCH: Normally interactive. Conversant.    Assessment and Plan: ***  Signed Lamar Blinks, MD

## 2021-11-23 NOTE — Patient Instructions (Incomplete)
It was great to see you again today, as always.  Assuming all is well please see me in about 6 months

## 2021-11-29 ENCOUNTER — Ambulatory Visit: Payer: PPO | Admitting: Family Medicine

## 2021-11-30 NOTE — Progress Notes (Addendum)
[facility] [place], [place], [place] [address] [phone]  Date:  12/06/2021   Name:  Abigail [name]   DOB:  [birth year]   MRN:  [id number]  PCP:  Darreld Mclean, MD    Chief Complaint: DM follow up (Concerns/ questions: pt has some right flank pain, says she has been coughing from a sinus infection x 3 weeks. [place] exam: unable to see Dr until April )   History of Present Illness:  Abigail [name] is a 71 y.o. very pleasant female patient who presents with the following:  Abigail [name] is seen today for periodic follow-up  History of diabetes, hyperlipidemia, hypertension, chronic renal insufficiency with nephrology follow-up Most recent visit with myself was in June  COVID-19 booster- recommend  Foot exam is due- today  Tetanus appears to be due Eye exam; this is upcoming in April, a bit overdue but nothing sooner available  Can update A1c  She is using ozempic- given a pt assistance supply today She is also on farxiga for her DM from nephrology  Over the last month she has noted some cough and sinus congestion This has improved but she is still coughing up some material Using mucinex  She notes a tender spot in her right ribs- she thinks from cough  She feels like her energy level is low- she gets worn out doing her daily tasks but no CP or SOB   Lab Results  Component Value Date   HGBA1C 7.1 07/28/2021   Wt Readings from Last 3 Encounters:  12/06/21 143 lb (64.9 kg)  05/05/21 143 lb (64.9 kg)  05/03/21 145 lb (65.8 kg)    Patient Active Problem List   Diagnosis Date Noted   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/29/2020   Essential hypertension    Gastroesophageal reflux disease without esophagitis    Microscopic hematuria 09/16/2015   Trimalleolar fracture of ankle, closed 11/17/2014   Hyperlipidemia associated with type 2 diabetes mellitus ([place]) 07/24/2013   Chest pain 10/23/2012   Type 2 diabetes mellitus  without complication, without long-term current use of insulin ([place]) 10/08/2012   Anxiety 10/08/2012    Past Medical History:  Diagnosis Date   Anemia, unspecified    Anxiety    CKD (chronic kidney disease), stage III ([place])    Depression    Diabetes mellitus type II, non insulin dependent ([place])    On Ozempic and Farxiga   GERD (gastroesophageal reflux disease)    Hematuria    Hyperlipidemia    Hypertension    Mild mitral regurgitation 08/2017   Essentially normal-mild MR on echo.   Normal coronary arteries    Coronary CTA: No evidence of CAD.  Coronary calcium score 0.   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    Past Surgical History:  Procedure Laterality Date   CHOLECYSTECTOMY N/A 06/03/2015   Procedure: LAPAROSCOPIC CHOLECYSTECTOMY WITH INTRAOPERATIVE CHOLANGIOGRAM;  Surgeon: Donnie Mesa, MD;  Location: [facility];  Service: General;  Laterality: N/A;   COLONOSCOPY     HAMMER TOE SURGERY     ORIF ANKLE FRACTURE Left 11/17/2014   Procedure: OPEN REDUCTION INTERNAL FIXATION (ORIF) LEFT TRIMALLEOLAR ANKLE FRACTURE;  Surgeon: Marianna Payment, MD;  Location: [facility];  Service: Orthopedics;  Laterality: Left;   OVARIAN CYST REMOVAL      Social History   Tobacco Use   Smoking status: Never   Smokeless tobacco: Never  Vaping Use   Vaping  Use: Never used  Substance Use Topics   Alcohol use: Yes    Alcohol/week: 1.0 standard drink    Types: 1 Glasses of wine per week   Drug use: No    Family History  Problem Relation Age of Onset   Diabetes Mother    Hyperlipidemia Mother    Diabetes Father    Heart disease Father 68       AMI x 2; first AMI age 72   Stroke Father    Hypertension Father    Hyperlipidemia Brother    AAA (abdominal aortic aneurysm) Brother    Congestive Heart Failure Brother        s/p ICD   Coronary artery disease Brother        defibrillator; CHF   Kidney cancer Brother        Status post nephrectomy   Other Sister 69       Died  in a car accident    Allergies  Allergen Reactions   Pneumovax 23 [Pneumococcal Vac Polyvalent] Other (See Comments)    Pt had severe arm pain   Amaryl [Glimepiride] Nausea Only   Cephalexin Hives   Sulfa Antibiotics Rash    Medication list has been reviewed and updated.  Current Outpatient Medications on File Prior to Visit  Medication Sig Dispense Refill   Ascorbic Acid (VITAMIN C) 1000 MG tablet Take 1,000 mg by mouth daily.     atorvastatin (LIPITOR) 20 MG tablet TAKE ONE TABLET BY MOUTH ONCE DAILY 90 tablet 3   B Complex-C (SUPER B COMPLEX PO) Take 1 tablet by mouth daily.     blood glucose meter kit and supplies Dispense based on patient and insurance preference. Use up to four times daily as directed. (FOR ICD-10 E10.9, E11.9). 1 each 0   Blood Glucose Monitoring Suppl (ONE TOUCH ULTRA 2) w/Device KIT by Does not apply route.     Cholecalciferol (VITAMIN D3) 50 MCG (2000 UT) CAPS Take 1 capsule by mouth daily.     FARXIGA 10 MG TABS tablet Take 1 tablet (10 mg total) by mouth daily. For [place] and Me assistance program 90 tablet 3   FLUoxetine (PROZAC) 20 MG capsule Take 1 capsule (20 mg total) by mouth daily. 90 capsule 3   fluticasone (FLONASE) 50 MCG/ACT nasal spray Place 2 sprays into both nostrils daily. 16 g 1   glucose blood test strip 1 each by Other route as needed for other. Use as instructed     losartan (COZAAR) 25 MG tablet Take 1 tablet (25 mg total) by mouth daily. 90 tablet 3   methocarbamol (ROBAXIN) 500 MG tablet Take 1 tablet (500 mg total) by mouth every 8 (eight) hours as needed for muscle spasms. 30 tablet 0   Multiple Vitamins-Minerals (ALIVE WOMENS 50+) TABS Take 1 tablet by mouth daily.     nitroGLYCERIN (NITROSTAT) 0.4 MG SL tablet PLACE 1 TABLET UNDER THE TONGUE EVERY 5 MINUTES AS NEEDED FOR CHEST PAIN 25 tablet 2   omeprazole (PRILOSEC) 20 MG capsule TAKE ONE CAPSULE BY MOUTH ONCE DAILY 90 capsule 3   OneTouch Delica Lancets 23R MISC Use to check glucose  up to 4 times daily. Dx Code E11.9 400 each 1   propranolol (INDERAL) 10 MG tablet TAKE ONE TABLET BY MOUTH EVERY EIGHT TO TWELVE HOURS AS NEEDED FOR tremors 270 tablet 0   Semaglutide, 2 MG/DOSE, (OZEMPIC, 2 MG/DOSE,) 8 MG/3ML SOPN Inject 2 mg into the skin once a week. 24 mL  3   traZODone (DESYREL) 150 MG tablet TAKE ONE TABLET BY MOUTH AT bedtime 90 tablet 3   zinc gluconate 50 MG tablet Take 50 mg by mouth daily.     No current facility-administered medications on file prior to visit.    Review of Systems:  As per HPI- otherwise negative. BP Readings from Last 3 Encounters:  12/06/21 100/62  05/03/21 118/70  04/08/21 118/72      Physical Examination: Vitals:   12/06/21 1323  BP: 100/62  Pulse: 96  Resp: 18  Temp: 97.7 F (36.5 C)  SpO2: 96%   Vitals:   12/06/21 1323  Weight: 143 lb (64.9 kg)  Height: $Remove'5\' 5"'[id number]$  (1.651 m)   Body mass index is 23.8 kg/m. Ideal Body Weight: Weight in (lb) to have BMI = 25: 149.9  GEN: no acute distress. HEENT: Atraumatic, Normocephalic.  Ears and Nose: No external deformity. CV: RRR, No M/G/R. No JVD. No thrill. No extra heart sounds. PULM: CTA B, no wheezes, crackles, rhonchi. No retractions. No resp. distress. No accessory muscle use. ABD: S, NT, ND, +BS. No rebound. No HSM. EXTR: No c/c/e PSYCH: Normally interactive. Conversant.  Foot exam normal Small wound on left shin- no wound care needed but will update tetanus  Can reproduce tenderness by pressing on left lateral ribs   Assessment and Plan: Type 2 diabetes mellitus without complication, without long-term current use of insulin (HCC) - Plan: Comprehensive metabolic panel, Hemoglobin A1c  Hyperlipidemia associated with type 2 diabetes mellitus ([place]) - Plan: Lipid panel  Benign essential HTN - Plan: CBC, Comprehensive metabolic panel  Chronic renal impairment, stage 3b (HCC)  Screening for thyroid disorder - Plan: TSH  Fatigue, unspecified type - Plan: TSH, VITAMIN D 25  Hydroxy (Vit-D Deficiency, Fractures), B12  Wound of left lower extremity, initial encounter - Plan: Td vaccine greater than or equal to 7yo preservative free IM  Subacute cough - Plan: DG Ribs Unilateral W/Chest Right  Following up today as above Labs and x-ray pending Fatigue- if all labs are normal consider a sleep study   Signed Lamar Blinks, MD  Received films as below- message to pt  DG Ribs Unilateral W/Chest Right  Result Date: 12/06/2021 CLINICAL DATA:  Right rib pain for 1 month.  Cough. EXAM: RIGHT RIBS AND CHEST - 3+ VIEW COMPARISON:  Radiographs 12/18/2020.  CT 12/18/2020. FINDINGS: The heart size and mediastinal contours are stable. The lungs are clear. There is no pleural effusion or pneumothorax. There is no evidence of acute right-sided rib fracture or focal rib lesion. Cholecystectomy clips are noted. There are mild degenerative changes in the lumbar spine. IMPRESSION: No evidence of acute right-sided rib fracture, pleural effusion or pneumothorax. The lungs appear clear. Electronically Signed   By: Richardean Sale M.D.   On: 12/06/2021 14:16    Addendum 1/31, received her labs as below.  Message to patient  Results for orders placed or performed in visit on 12/06/21  CBC  Result Value Ref Range   WBC 7.9 4.0 - 10.5 K/uL   RBC 4.55 3.87 - 5.11 Mil/uL   Platelets 254.0 150.0 - 400.0 K/uL   Hemoglobin 12.7 12.0 - 15.0 g/dL   HCT 38.6 36.0 - 46.0 %   MCV 84.8 78.0 - 100.0 fl   MCHC 32.9 30.0 - 36.0 g/dL   RDW 13.8 11.5 - 15.5 %  Comprehensive metabolic panel  Result Value Ref Range   Sodium 138 135 - 145 mEq/L   Potassium 4.5 3.5 -  5.1 mEq/L   Chloride 100 96 - 112 mEq/L   CO2 32 19 - 32 mEq/L   Glucose, Bld 111 (H) 70 - 99 mg/dL   BUN 21 6 - 23 mg/dL   Creatinine, Ser 1.53 (H) 0.40 - 1.20 mg/dL   Total Bilirubin 0.4 0.2 - 1.2 mg/dL   Alkaline Phosphatase 90 39 - 117 U/L   AST 14 0 - 37 U/L   ALT 12 0 - 35 U/L   Total Protein 6.6 6.0 - 8.3 g/dL    Albumin 4.2 3.5 - 5.2 g/dL   GFR 33.85 (L) >60.00 mL/min   Calcium 9.1 8.4 - 10.5 mg/dL  Hemoglobin A1c  Result Value Ref Range   Hgb A1c MFr Bld 7.1 (H) 4.6 - 6.5 %  Lipid panel  Result Value Ref Range   Cholesterol 148 0 - 200 mg/dL   Triglycerides 168.0 (H) 0.0 - 149.0 mg/dL   HDL 48.40 >39.00 mg/dL   VLDL 33.6 0.0 - 40.0 mg/dL   LDL Cholesterol 66 0 - 99 mg/dL   Total CHOL/HDL Ratio 3    NonHDL 99.77   TSH  Result Value Ref Range   TSH 1.54 0.35 - 5.50 uIU/mL  VITAMIN D 25 Hydroxy (Vit-D Deficiency, Fractures)  Result Value Ref Range   VITD 47.16 30.00 - 100.00 ng/mL  B12  Result Value Ref Range   Vitamin B-12 478 211 - 911 pg/mL

## 2021-12-06 ENCOUNTER — Ambulatory Visit (INDEPENDENT_AMBULATORY_CARE_PROVIDER_SITE_OTHER): Payer: PPO | Admitting: Family Medicine

## 2021-12-06 ENCOUNTER — Encounter: Payer: Self-pay | Admitting: Family Medicine

## 2021-12-06 ENCOUNTER — Ambulatory Visit (HOSPITAL_BASED_OUTPATIENT_CLINIC_OR_DEPARTMENT_OTHER)
Admission: RE | Admit: 2021-12-06 | Discharge: 2021-12-06 | Disposition: A | Discharge status: Home / Self Care | Payer: PPO | Source: Ambulatory Visit | Attending: Family Medicine | Admitting: Family Medicine

## 2021-12-06 ENCOUNTER — Telehealth: Payer: PPO

## 2021-12-06 ENCOUNTER — Other Ambulatory Visit: Payer: Self-pay

## 2021-12-06 VITALS — BP 100/62 | HR 96 | Temp 97.7°F | Resp 18 | Ht 65.0 in | Wt 143.0 lb

## 2021-12-06 DIAGNOSIS — R5383 Other fatigue: Secondary | ICD-10-CM | POA: Diagnosis not present

## 2021-12-06 DIAGNOSIS — E119 Type 2 diabetes mellitus without complications: Secondary | ICD-10-CM

## 2021-12-06 DIAGNOSIS — R0781 Pleurodynia: Secondary | ICD-10-CM | POA: Diagnosis not present

## 2021-12-06 DIAGNOSIS — E785 Hyperlipidemia, unspecified: Secondary | ICD-10-CM

## 2021-12-06 DIAGNOSIS — Z23 Encounter for immunization: Secondary | ICD-10-CM

## 2021-12-06 DIAGNOSIS — S81802A Unspecified open wound, left lower leg, initial encounter: Secondary | ICD-10-CM

## 2021-12-06 DIAGNOSIS — I10 Essential (primary) hypertension: Secondary | ICD-10-CM | POA: Diagnosis not present

## 2021-12-06 DIAGNOSIS — R052 Subacute cough: Secondary | ICD-10-CM | POA: Diagnosis not present

## 2021-12-06 DIAGNOSIS — Z1329 Encounter for screening for other suspected endocrine disorder: Secondary | ICD-10-CM | POA: Diagnosis not present

## 2021-12-06 DIAGNOSIS — M47816 Spondylosis without myelopathy or radiculopathy, lumbar region: Secondary | ICD-10-CM | POA: Diagnosis not present

## 2021-12-06 DIAGNOSIS — N1832 Chronic kidney disease, stage 3b: Secondary | ICD-10-CM

## 2021-12-06 DIAGNOSIS — R059 Cough, unspecified: Secondary | ICD-10-CM | POA: Diagnosis not present

## 2021-12-06 DIAGNOSIS — E1169 Type 2 diabetes mellitus with other specified complication: Secondary | ICD-10-CM | POA: Diagnosis not present

## 2021-12-06 DIAGNOSIS — Z9049 Acquired absence of other specified parts of digestive tract: Secondary | ICD-10-CM | POA: Diagnosis not present

## 2021-12-06 NOTE — Patient Instructions (Addendum)
It was good to see you again today!  You got your tetanus booster today Recommend a covid booster if not done recently  I will be in touch with your labs- please have blood drawn and then go to the imaging dept for your x-rays  Ask Dr Moshe Cipro about decreasing farxiga to 5 mg?    Please see me in about 6 months

## 2021-12-07 ENCOUNTER — Encounter: Payer: Self-pay | Admitting: Family Medicine

## 2021-12-07 DIAGNOSIS — E119 Type 2 diabetes mellitus without complications: Secondary | ICD-10-CM | POA: Diagnosis not present

## 2021-12-07 DIAGNOSIS — I10 Essential (primary) hypertension: Secondary | ICD-10-CM | POA: Diagnosis not present

## 2021-12-07 DIAGNOSIS — E1169 Type 2 diabetes mellitus with other specified complication: Secondary | ICD-10-CM | POA: Diagnosis not present

## 2021-12-07 DIAGNOSIS — E785 Hyperlipidemia, unspecified: Secondary | ICD-10-CM | POA: Diagnosis not present

## 2021-12-07 LAB — LIPID PANEL
Cholesterol: 148 mg/dL (ref 0–200)
HDL: 48.4 mg/dL (ref >39.00)
LDL Cholesterol: 66 mg/dL (ref 0–99)
NonHDL: 99.77
Total CHOL/HDL Ratio: 3
Triglycerides: 168 mg/dL — ABNORMAL HIGH (ref 0.0–149.0)
VLDL: 33.6 mg/dL (ref 0.0–40.0)

## 2021-12-07 LAB — VITAMIN B12: Vitamin B-12: 478 pg/mL (ref 211–911)

## 2021-12-07 LAB — CBC
HCT: 38.6 % (ref 36.0–46.0)
Hemoglobin: 12.7 g/dL (ref 12.0–15.0)
MCHC: 32.9 g/dL (ref 30.0–36.0)
MCV: 84.8 fl (ref 78.0–100.0)
Platelets: 254 10*3/uL (ref 150.0–400.0)
RBC: 4.55 Mil/uL (ref 3.87–5.11)
RDW: 13.8 % (ref 11.5–15.5)
WBC: 7.9 10*3/uL (ref 4.0–10.5)

## 2021-12-07 LAB — COMPREHENSIVE METABOLIC PANEL
ALT: 12 U/L (ref 0–35)
AST: 14 U/L (ref 0–37)
Albumin: 4.2 g/dL (ref 3.5–5.2)
Alkaline Phosphatase: 90 U/L (ref 39–117)
BUN: 21 mg/dL (ref 6–23)
CO2: 32 mEq/L (ref 19–32)
Calcium: 9.1 mg/dL (ref 8.4–10.5)
Chloride: 100 mEq/L (ref 96–112)
Creatinine, Ser: 1.53 mg/dL — ABNORMAL HIGH (ref 0.40–1.20)
GFR: 33.85 mL/min — ABNORMAL LOW (ref >60.00)
Glucose, Bld: 111 mg/dL — ABNORMAL HIGH (ref 70–99)
Potassium: 4.5 mEq/L (ref 3.5–5.1)
Sodium: 138 mEq/L (ref 135–145)
Total Bilirubin: 0.4 mg/dL (ref 0.2–1.2)
Total Protein: 6.6 g/dL (ref 6.0–8.3)

## 2021-12-07 LAB — VITAMIN D 25 HYDROXY (VIT D DEFICIENCY, FRACTURES): VITD: 47.16 ng/mL (ref 30.00–100.00)

## 2021-12-07 LAB — HEMOGLOBIN A1C: Hgb A1c MFr Bld: 7.1 % — ABNORMAL HIGH (ref 4.6–6.5)

## 2021-12-07 LAB — TSH: TSH: 1.54 u[IU]/mL (ref 0.35–5.50)

## 2021-12-10 ENCOUNTER — Telehealth: Payer: PPO

## 2021-12-20 ENCOUNTER — Ambulatory Visit (INDEPENDENT_AMBULATORY_CARE_PROVIDER_SITE_OTHER): Payer: PPO | Admitting: Pharmacist

## 2021-12-20 DIAGNOSIS — N1832 Chronic kidney disease, stage 3b: Secondary | ICD-10-CM

## 2021-12-20 DIAGNOSIS — E119 Type 2 diabetes mellitus without complications: Secondary | ICD-10-CM

## 2021-12-20 NOTE — Patient Instructions (Signed)
Mrs. Nazareno, It was a pleasure speaking with you today.  I have attached a summary of our visit today and information about your health goals.   If you have any questions or concerns, please feel free to contact me either at the phone number below or with a MyChart message.   Keep up the good work!  Cherre Robins, PharmD Clinical Pharmacist [facility] [phone] (direct line)  [phone] (main office number)   CARE PLAN ENTRY (see longitudinal plan of care for additional care plan information)  Current Barriers:  Chronic Disease Management support, education, and care coordination needs related to Diabetes, Hypertension, Hyperlipidemia, Chest Pain, Anxiety, Tremor, Insomnia, GERD   Hypertension BP Readings from Last 3 Encounters:  12/06/21 100/62  05/03/21 118/70  04/08/21 118/72   Pharmacist Clinical Goal(s): Over the next 90 days, patient will work with PharmD and providers to maintain BP goal <140/90 Current regimen:  Losartan 25mg  daily Patient self care activities - Over the next 90 days, patient will: Maintain hypertension medication regimen.  Hyperlipidemia Lab Results  Component Value Date/Time   [facility] 12/06/2021 01:47 PM   Pharmacist Clinical Goal(s): Over the next 90 days, patient will work with PharmD and providers to maintain LDL goal < 100 Current regimen:  Atorvastatin 20mg  daily Patient self care activities - Over the next 90 days, patient will: Maintain cholesterol medication regimen.  Diabetes Lab Results  Component Value Date/Time   HGBA1C 7.1 (H) 12/06/2021 01:47 PM   HGBA1C 7.1 07/28/2021 12:00 AM   HGBA1C 7.6 03/31/2021 12:00 AM   Pharmacist Clinical Goal(s): Over the next 90 days, patient will work with PharmD and providers to achieve A1c goal <7% Current regimen:  Ozempic 2mg  weekly Farxiga 10mg  daily (added by Dr Moshe Cipro for blood glucose and kidney  function) Interventions: Coordinated with [employer] patient assistance program regarding [id number] application Patient self care activities - Over the next 90 days, patient will: Check blood sugar once daily, document, and provide at future appointments Contact provider with any episodes of hypoglycemia home blood glucose goals  Fasting blood glucose goal (before meals) = 80 to 130 Blood glucose goal after a meal = less than 180   Health Maintenance:  Reviewed vaccination history and discussed benefits of Shingrix, and COVID bivalent vaccinations Patient to get the following vaccines in 2023 - Shingrix  Medication management Pharmacist Clinical Goal(s): Over the next 90 days, patient will work with PharmD and providers to achieve optimal medication adherence Current pharmacy: [facility] Comprehensive medication review performed. Utilize pharmacy for medication synchronization, packaging and delivery  Patient self care activities - Over the next 90 days, patient will: Focus on medication adherence by filling and taking medications appropriately  Take medications as prescribed Report any questions or concerns to PharmD and/or provider(s) Continue to check blood glucose 1 to 2 times per day and record for future visits.  collaborate with provider on medication access solutions  Follow Up Plan: Telephone follow up appointment with clinical pharmacist in 1 month to check on 2023 patient assistance paperwork  Please see past updates related to this goal by clicking on the "Past Updates" button in the selected goal    Patient verbalizes understanding of instructions and care plan provided today and agrees to view in [place]. Active MyChart status confirmed with patient.

## 2021-12-20 NOTE — Chronic Care Management (AMB) (Signed)
Chronic Care Management Pharmacy Note  12/20/2021 Name:  Abigail [name] MRN:  [id number] DOB:  1951-05-07  Summary:  Checked on request from 11/2021 for Ozempic refill form [employer] patient assistance program - in process. [facility] has had shipping  / supply issues and they are behind. Patient report she has 3 weeks of Ozempic on hand.  [facility] estimates our office should received shipment in 7 to 10 days Reminded to get yearly eye exam ( patient has appointment scheduled for 02/2022 - soonest she could get)   Subjective: Abigail [name] is an 71 y.o. year old female who is a primary patient of Copland, Gay Filler, MD.  The CCM team was consulted for assistance with disease management and care coordination needs.    Engaged with patient by telephone for follow up visit in response to provider referral for pharmacy case management and/or care coordination services.   Consent to Services:  The patient was given information about Chronic Care Management services, agreed to services, and gave verbal consent prior to initiation of services.  Please see initial visit note for detailed documentation.   Patient Care Team: Copland, Gay Filler, MD as PCP - General (Family Medicine) Leonie Man, MD as PCP - Cardiology (Cardiology) Cherre Robins, RPH-CPP (Pharmacist)  Recent office visits: 12/06/2021 - PCP (Dr Lorelei Pont) Follow up DM and right flank pain. No medication changes. Labs checked.  07/08/2021 - PCP (Dr Lorelei Pont) no changes in medications  Recent consult visits: 07/2021 - Nephrologist (Dr Moshe Cipro)   Hospital visits: None in previous 6 months  Objective:  Lab Results  Component Value Date   CREATININE 1.53 (H) 12/06/2021   CREATININE 1.5 (A) 07/28/2021   CREATININE 1.6 (A) 03/31/2021    Lab Results  Component Value Date   HGBA1C 7.1 (H) 12/06/2021   Last diabetic Eye exam:  Lab Results  Component Value Date/Time   HMDIABEYEEXA No Retinopathy 08/17/2020  12:00 AM    Last diabetic Foot exam: No results found for: HMDIABFOOTEX      Component Value Date/Time   CHOL 148 12/06/2021 1347   TRIG 168.0 (H) 12/06/2021 1347   HDL 48.40 12/06/2021 1347   CHOLHDL 3 12/06/2021 1347   VLDL 33.6 12/06/2021 1347   LDLCALC 66 12/06/2021 1347    Hepatic Function Latest Ref Rng & Units 12/06/2021 03/31/2021 12/21/2020  Total Protein 6.0 - 8.3 g/dL 6.6 - -  Albumin 3.5 - 5.2 g/dL 4.2 4.4 4.3  AST 0 - 37 U/L 14 - -  ALT 0 - 35 U/L 12 - -  Alk Phosphatase 39 - 117 U/L 90 - -  Total Bilirubin 0.2 - 1.2 mg/dL 0.4 - -    Lab Results  Component Value Date/Time   TSH 1.54 12/06/2021 01:47 PM   TSH 2.16 02/24/2020 03:52 PM   FREET4 0.91 03/17/2016 11:07 AM    CBC Latest Ref Rng & Units 12/06/2021 07/28/2021 03/31/2021  WBC 4.0 - 10.5 K/uL 7.9 - -  Hemoglobin 12.0 - 15.0 g/dL 12.7 12.8 12.9  Hematocrit 36.0 - 46.0 % 38.6 - -  Platelets 150.0 - 400.0 K/uL 254.0 - -    Lab Results  Component Value Date/Time   VD25OH 47.16 12/06/2021 01:47 PM   VD25OH 37.36 02/24/2020 03:52 PM    Clinical ASCVD: No  The 10-year ASCVD risk score (Arnett DK, et al., 2019) is: 17.1%   Values used to calculate the score:     Age: 33 years     Sex:  Female     Is Non-Hispanic African American: No     Diabetic: Yes     Tobacco smoker: No     Systolic Blood Pressure: 366 mmHg     Is BP treated: Yes     HDL Cholesterol: 48.4 mg/dL     Total Cholesterol: 148 mg/dL     Social History   Tobacco Use  Smoking Status Never  Smokeless Tobacco Never   BP Readings from Last 3 Encounters:  12/06/21 100/62  05/03/21 118/70  04/08/21 118/72   Pulse Readings from Last 3 Encounters:  12/06/21 96  05/03/21 86  04/08/21 82   Wt Readings from Last 3 Encounters:  12/06/21 143 lb (64.9 kg)  05/05/21 143 lb (64.9 kg)  05/03/21 145 lb (65.8 kg)    Assessment: Review of patient past medical history, allergies, medications, health status, including review of consultants  reports, laboratory and other test data, was performed as part of comprehensive evaluation and provision of chronic care management services.   SDOH:  (Social Determinants of Health) assessments and interventions performed:     CCM Care Plan  Allergies  Allergen Reactions   Pneumovax 23 [Pneumococcal Vac Polyvalent] Other (See Comments)    Pt had severe arm pain   Amaryl [Glimepiride] Nausea Only   Cephalexin Hives   Sulfa Antibiotics Rash    Medications Reviewed Today     Reviewed by Cherre Robins, RPH-CPP (Pharmacist) on 12/20/21 at [place]  Med List Status: <None>   Medication Order Taking? Sig Documenting Provider Last Dose Status Informant  Ascorbic Acid (VITAMIN C) 1000 MG tablet [id number] Yes Take 1,000 mg by mouth daily. [provider] Taking Active   atorvastatin (LIPITOR) 20 MG tablet [id number] Yes TAKE ONE TABLET BY MOUTH ONCE DAILY Copland, Gay Filler, MD Taking Active   B Complex-C (SUPER B COMPLEX PO) [id number] Yes Take 1 tablet by mouth daily. [provider] Taking Active   blood glucose meter kit and supplies [id number] Yes Dispense based on patient and insurance preference. Use up to four times daily as directed. (FOR ICD-10 E10.9, E11.9). Copland, Gay Filler, MD Taking Active   Blood Glucose Monitoring Suppl (ONE TOUCH ULTRA 2) w/Device KIT [id number] Yes by Does not apply route. [provider] Taking Active   Cholecalciferol (VITAMIN D3) 50 MCG (2000 UT) CAPS [id number] Yes Take 1 capsule by mouth daily. [provider] Taking Active   FARXIGA 10 MG TABS tablet [id number] Yes Take 1 tablet (10 mg total) by mouth daily. For [place] and Me assistance program Copland, Gay Filler, MD Taking Active   FLUoxetine (PROZAC) 20 MG capsule [id number] Yes Take 1 capsule (20 mg total) by mouth daily. Copland, Gay Filler, MD Taking Active   fluticasone (FLONASE) 50 MCG/ACT nasal spray [id number] Yes Place 2 sprays into both nostrils daily. Saguier, Percell Miller, PA-C  Taking Active   glucose blood test strip [id number] Yes 1 each by Other route as needed for other. Use as instructed [provider] Taking Active   losartan (COZAAR) 25 MG tablet [id number] Yes Take 1 tablet (25 mg total) by mouth daily. Copland, Gay Filler, MD Taking Active   methocarbamol (ROBAXIN) 500 MG tablet [id number] Yes Take 1 tablet (500 mg total) by mouth every 8 (eight) hours as needed for muscle spasms. Copland, Gay Filler, MD Taking Active   Multiple Vitamins-Minerals (ALIVE WOMENS 50+) Sheral Flow [id number] Yes Take 1 tablet by mouth daily. [provider] Taking Active   nitroGLYCERIN (NITROSTAT) 0.4 MG SL tablet  [id number] Yes PLACE 1 TABLET UNDER THE TONGUE EVERY 5 MINUTES AS NEEDED FOR CHEST PAIN Copland, Gay Filler, MD Taking Active            Med Note [place]   Fri Jun 12, 2020  4:22 PM) Has not had to use, but has it just in case  omeprazole (PRILOSEC) 20 MG capsule [id number] Yes TAKE ONE CAPSULE BY MOUTH ONCE DAILY Copland, Gay Filler, MD Taking Active   OneTouch Delica Lancets 64Q MISC [id number] Yes Use to check glucose up to 4 times daily. Dx Code E11.9 Copland, Gay Filler, MD Taking Active   propranolol (INDERAL) 10 MG tablet [id number] Yes TAKE ONE TABLET BY MOUTH EVERY EIGHT TO TWELVE HOURS AS NEEDED FOR tremors Copland, Gay Filler, MD Taking Active   Semaglutide, 2 MG/DOSE, (OZEMPIC, 2 MG/DOSE,) 8 MG/3ML SOPN [id number] Yes Inject 2 mg into the skin once a week. Copland, Gay Filler, MD Taking Active            Med Note Barbaraann Boys Dec 20, 2021  8:48 AM) Approved thur [employer] patient assistance program thru 04/02/2022  traZODone (DESYREL) 150 MG tablet [id number] Yes TAKE ONE TABLET BY MOUTH AT bedtime Copland, Gay Filler, MD Taking Active   zinc gluconate 50 MG tablet [id number] Yes Take 50 mg by mouth daily. [provider] Taking Active             Patient Active Problem List   Diagnosis Date Noted   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/29/2020   Essential  hypertension    Gastroesophageal reflux disease without esophagitis    Microscopic hematuria 09/16/2015   Trimalleolar fracture of ankle, closed 11/17/2014   Hyperlipidemia associated with type 2 diabetes mellitus ([place]) 07/24/2013   Chest pain 10/23/2012   Type 2 diabetes mellitus without complication, without long-term current use of insulin ([place]) 10/08/2012   Anxiety 10/08/2012    Immunization History  Administered Date(s) Administered   Fluad Quad(high Dose 65+) 07/01/2019   Influenza Whole 08/08/2013   Influenza, High Dose Seasonal PF 08/03/2016, 08/17/2017   Influenza, Seasonal, Injecte, Preservative Fre 10/08/2012   Influenza,inj,Quad PF,6+ Mos 08/05/2015   Influenza-Unspecified 08/08/2014, 08/13/2020, 07/31/2021   PFIZER(Purple Top)SARS-COV-2 Vaccination 01/13/2020, 02/12/2020, 08/13/2020   Pneumococcal Conjugate-13 11/03/2014   Pneumococcal Polysaccharide-23 02/10/2016   Td 12/06/2021   Zoster Recombinat (Shingrix) 09/13/2018    Conditions to be addressed/monitored: HTN, HLD, DMII, and GERD  Care Plan : General Pharmacy (Adult)  Updates made by Cherre Robins, RPH-CPP since 12/20/2021 12:00 AM     Problem: Chronic Disease Management support, education, and care coordination needs related to Diabetes, HTN, Hyperlipidemia, Chest Pain, Anxiety, Tremor, Insomnia, GERD   Priority: High  Onset Date: 01/25/2021     Long-Range Goal: Management of medicaiton and chronic conditions   Start Date: 01/25/2021  Recent Progress: On track  Priority: High  Note:   Current Barriers:  Unable to independently afford treatment regimen Chronic Disease Management support, education, and care coordination needs related to Diabetes, Hypertension, Hyperlipidemia, Chest Pain, Anxiety, Tremor, Insomnia, GERD  Pharmacist Clinical Goal(s):  Over the next 180 days, patient will achieve adherence to monitoring guidelines and medication adherence to achieve therapeutic efficacy Complete  application for [place] and Ozempic  through collaboration with PharmD and provider.   Interventions: 1:1 collaboration with Copland, Gay Filler, MD regarding development and update of comprehensive plan of care as evidenced by provider attestation and co-signature Inter-disciplinary care team collaboration (see longitudinal plan of care) Comprehensive medication  review performed; medication list updated in electronic medical record   Hypertension Controlled; BP goal <140/90 Current regimen:  Losartan $RemoveB'25mg'eHuqnHfm$  daily Reviewed refill history  Pharmacist Interventions:  Maintain hypertension medication regimen Discussed blood pressure goal  Hyperlipidemia At goal of LDL < 100 Current regimen:  Atorvastatin $RemoveBefor'20mg'bdOJHODReLWS$  daily Pharmacist Interventions Maintain current cholesterol medication regimen. Revived refill history - last filled atorvastatin 10/04/2021 for 90 DS  Diabetes Improving; A1c goal <7% Last A1c was 7.1% (11/2021) Current regimen:  Ozempic $Remove'2mg'QILHyaE$  weekly  Farxiga $Remove'10mg'BBViYOj$  daily (added by Dr Moshe Cipro for blood glucose and kidney function) Patient received Ozempic from [employer] patient assistance in 2022. She report she currently has about 3 weeks supply on hand.  Receiving Farigra form [employer]  patient assistance program - has been re-enrolled in thru 11/06/2022 Recent home BG readings: 130 to 200; Mostly in 140's.  Diet: avoiding sugar containing beverages, limits intake of high carbohydrate foods such as pasta and bread Exercise: walks her dog twice a day (about 30 minutes total)  Pharmacist Interventions: [employer] patient assistance program. Patient's next [employer] is pending. Expect it will be shipped in next 7 to 10 business days Patient has 3 weeks on hand. Verified she is approved thru 04/02/2022. Recommended she continue to check blood glucose daily, document, and provide at future appointments Continue to limit intake of sugar and food high in  carbohydrates.  Continue to exercise daily Reminded to get yearly eye exam ( patient has appointment scheduled for 02/2022 - soonest she could get)   Health Maintenance:  Reviewed vaccination history and discussed benefits of Shingrix and COVID bivalent vaccinations Patient to get the following vaccines in 2023 - [place] bivalent booster  Medication management Current pharmacy: [facility] Comprehensive medication review performed. Updated medication list.  Reviewed refill history and assessed adherence Continue to utilize pharmacy for medication synchronization, packaging and delivery  Patient Goals/Self-Care Activities Over the next 180 days, patient will:  take medications as prescribed  Continue to check blood glucose 1 to 2 times per day and record for future visits.  collaborate with provider on medication access solutions  Follow Up Plan: Telephone follow up appointment with clinical pharmacist in 1 month to check on 2023 patient assistance paperwork   Please see past updates related to this goal by clicking on the "Past Updates" button in the selected goal       Medication Assistance:  Farxiga obtained through [facility] and Me  medication assistance program.  Enrollment ends 66/44/0347 Application for 4259 patient assistance program for NovoNordisk / Ozempic has been faxed to [employer] patient assistance program. Expecting shipment in 7 to 10 days.   Patient's preferred pharmacy is:  [facility] [place], [place] - [address] [place] [address] [place] [address] Phone: [phone] Fax: [phone]  [facility] - [place], [place] - [address] [place] [address] Phone: [phone] Fax: [phone]  [facility] [place], [place] [place] [address] Phone: [phone] Fax:  [phone]  [facility] [place], [place] - [address] [place] [facility] [address] Phone: [phone] Fax: [place] [address], [place] [address] Phone: [phone] Fax: [place], [place] [address] [place]  [place] [address] Phone: [phone] Fax: [phone]    Follow Up:  Patient agrees to Care Plan and Follow-up.  Plan: Telephone follow up appointment with care management team member scheduled for:  1 month  Cherre Robins, PharmD Clinical Pharmacist [facility]

## 2022-01-04 DIAGNOSIS — E119 Type 2 diabetes mellitus without complications: Secondary | ICD-10-CM

## 2022-01-07 ENCOUNTER — Other Ambulatory Visit: Payer: Self-pay | Admitting: Family Medicine

## 2022-01-07 DIAGNOSIS — Z636 Dependent relative needing care at home: Secondary | ICD-10-CM

## 2022-01-07 DIAGNOSIS — F4323 Adjustment disorder with mixed anxiety and depressed mood: Secondary | ICD-10-CM

## 2022-01-13 ENCOUNTER — Ambulatory Visit (INDEPENDENT_AMBULATORY_CARE_PROVIDER_SITE_OTHER): Payer: PPO | Admitting: Pharmacist

## 2022-01-13 DIAGNOSIS — I10 Essential (primary) hypertension: Secondary | ICD-10-CM

## 2022-01-13 DIAGNOSIS — E785 Hyperlipidemia, unspecified: Secondary | ICD-10-CM

## 2022-01-13 DIAGNOSIS — E119 Type 2 diabetes mellitus without complications: Secondary | ICD-10-CM

## 2022-01-13 NOTE — Patient Instructions (Signed)
Mrs. Mckiddy, ?It was a pleasure speaking with you today.  ?I have attached a summary of our visit today and information about your health goals.  ? ?If you have any questions or concerns, please feel free to contact me either at the phone number below or with a MyChart message.  ? ?Keep up the good work! ? ?Cherre Robins, PharmD ?Clinical Pharmacist ?[facility] ?[place] ?[phone] (direct line)  ?[phone] (main office number) ? ? ?Chronic Care Management CARE PLAN ?Hypertension ?BP Readings from Last 3 Encounters:  ?12/06/21 100/62  ?05/03/21 118/70  ?04/08/21 118/72  ? ?Pharmacist Clinical Goal(s): ?Over the next 90 days, patient will work with PharmD and providers to maintain BP goal <140/90 ?Current regimen:  ?Losartan '25mg'$  daily ?Patient self care activities - Over the next 90 days, patient will: ?Maintain hypertension medication regimen. ? ?Hyperlipidemia ?Lab Results  ?Component Value Date/Time  ? [place] 66 12/06/2021 01:47 PM  ? ?Pharmacist Clinical Goal(s): ?Over the next 90 days, patient will work with PharmD and providers to maintain LDL goal < 100 ?Current regimen:  ?Atorvastatin '20mg'$  daily ?Patient self care activities - Over the next 90 days, patient will: ?Maintain cholesterol medication regimen. ? ?Diabetes ?Lab Results  ?Component Value Date/Time  ? HGBA1C 7.1 (H) 12/06/2021 01:47 PM  ? HGBA1C 7.1 07/28/2021 12:00 AM  ? HGBA1C 7.6 03/31/2021 12:00 AM  ? ?Pharmacist Clinical Goal(s): ?Over the next 90 days, patient will work with PharmD and providers to achieve A1c goal <7% ?Current regimen:  ?Ozempic '2mg'$  weekly ?Farxiga '10mg'$  daily (added by Dr Moshe Cipro for blood glucose and kidney function) ?Interventions: ?Coordinated with [employer] patient assistance program regarding [id number] application ?Patient self care activities - Over the next 90 days, patient will: ?Check blood sugar once daily, document, and provide at future appointments ?Contact provider with any  episodes of hypoglycemia ?home blood glucose goals  ?Fasting blood glucose goal (before meals) = 80 to 130 ?Blood glucose goal after a meal = less than 180  ? ?Health Maintenance:  ?Reviewed vaccination history and discussed benefits of Shingrix, and COVID bivalent vaccinations ?Patient to get the following vaccines in 2023 - Shingrix ? ?Medication management ?Pharmacist Clinical Goal(s): ?Over the next 90 days, patient will work with PharmD and providers to achieve optimal medication adherence ?Current pharmacy: [facility] ?Interventions ?Comprehensive medication review performed. ?Utilize pharmacy for medication synchronization, packaging and delivery ? ?Patient self care activities - Over the next 90 days, patient will: ?Focus on medication adherence by filling and taking medications appropriately  ?Take medications as prescribed ?Report any questions or concerns to PharmD and/or provider(s) ?Continue to check blood glucose 1 to 2 times per day and record for future visits.  ?collaborate with provider on medication access solutions ? ?Follow Up Plan: Telephone follow up appointment with clinical pharmacist in 1 month to check on 2023 patient assistance paperwork ? ? ?Patient verbalizes understanding of instructions and care plan provided today and agrees to view in [place]. Active MyChart status confirmed with patient.    ? ? ? ?

## 2022-01-13 NOTE — Chronic Care Management (AMB) (Signed)
Chronic Care Management Pharmacy Note  01/13/2022 Name:  Abigail [name] MRN:  [id number] DOB:  10/08/1951  Summary:  Checked on request from 11/2021 for Ozempic refill form [employer] patient assistance program - in process. [facility] has had shipping  / supply issues and they are behind. Patient report she has 3 pens of Ozempic on hand.  Unable to reach representative for [employer] to get shipping date estimate.  Reminded to get yearly eye exam ( patient has appointment scheduled for 02/2022 - soonest she could get)   Subjective: Abigail [name] is an 71 y.o. year old female who is a primary patient of Copland, Gay Filler, MD.  The CCM team was consulted for assistance with disease management and care coordination needs.    Engaged with patient by telephone for follow up visit in response to provider referral for pharmacy case management and/or care coordination services.   Consent to Services:  The patient was given information about Chronic Care Management services, agreed to services, and gave verbal consent prior to initiation of services.  Please see initial visit note for detailed documentation.   Patient Care Team: Copland, Gay Filler, MD as PCP - General (Family Medicine) Leonie Man, MD as PCP - Cardiology (Cardiology) Cherre Robins, RPH-CPP (Pharmacist)  Recent office visits: 12/06/2021 - PCP (Dr Lorelei Pont) Follow up DM and right flank pain. No medication changes. Labs checked.  07/08/2021 - PCP (Dr Lorelei Pont) no changes in medications  Recent consult visits: 07/2021 - Nephrologist (Dr Moshe Cipro)   Hospital visits: None in previous 6 months  Objective:  Lab Results  Component Value Date   CREATININE 1.53 (H) 12/06/2021   CREATININE 1.5 (A) 07/28/2021   CREATININE 1.6 (A) 03/31/2021    Lab Results  Component Value Date   HGBA1C 7.1 (H) 12/06/2021   Last diabetic Eye exam:  Lab Results  Component Value Date/Time   HMDIABEYEEXA No Retinopathy  08/17/2020 12:00 AM    Last diabetic Foot exam: No results found for: HMDIABFOOTEX      Component Value Date/Time   CHOL 148 12/06/2021 1347   TRIG 168.0 (H) 12/06/2021 1347   HDL 48.40 12/06/2021 1347   CHOLHDL 3 12/06/2021 1347   VLDL 33.6 12/06/2021 1347   LDLCALC 66 12/06/2021 1347    Hepatic Function Latest Ref Rng & Units 12/06/2021 03/31/2021 12/21/2020  Total Protein 6.0 - 8.3 g/dL 6.6 - -  Albumin 3.5 - 5.2 g/dL 4.2 4.4 4.3  AST 0 - 37 U/L 14 - -  ALT 0 - 35 U/L 12 - -  Alk Phosphatase 39 - 117 U/L 90 - -  Total Bilirubin 0.2 - 1.2 mg/dL 0.4 - -    Lab Results  Component Value Date/Time   TSH 1.54 12/06/2021 01:47 PM   TSH 2.16 02/24/2020 03:52 PM   FREET4 0.91 03/17/2016 11:07 AM    CBC Latest Ref Rng & Units 12/06/2021 07/28/2021 03/31/2021  WBC 4.0 - 10.5 K/uL 7.9 - -  Hemoglobin 12.0 - 15.0 g/dL 12.7 12.8 12.9  Hematocrit 36.0 - 46.0 % 38.6 - -  Platelets 150.0 - 400.0 K/uL 254.0 - -    Lab Results  Component Value Date/Time   VD25OH 47.16 12/06/2021 01:47 PM   VD25OH 37.36 02/24/2020 03:52 PM    Clinical ASCVD: No  The 10-year ASCVD risk score (Arnett DK, et al., 2019) is: 17.1%   Values used to calculate the score:     Age: 17 years     Sex:  Female     Is Non-Hispanic African American: No     Diabetic: Yes     Tobacco smoker: No     Systolic Blood Pressure: 675 mmHg     Is BP treated: Yes     HDL Cholesterol: 48.4 mg/dL     Total Cholesterol: 148 mg/dL     Social History   Tobacco Use  Smoking Status Never  Smokeless Tobacco Never   BP Readings from Last 3 Encounters:  12/06/21 100/62  05/03/21 118/70  04/08/21 118/72   Pulse Readings from Last 3 Encounters:  12/06/21 96  05/03/21 86  04/08/21 82   Wt Readings from Last 3 Encounters:  12/06/21 143 lb (64.9 kg)  05/05/21 143 lb (64.9 kg)  05/03/21 145 lb (65.8 kg)    Assessment: Review of patient past medical history, allergies, medications, health status, including review of  consultants reports, laboratory and other test data, was performed as part of comprehensive evaluation and provision of chronic care management services.   SDOH:  (Social Determinants of Health) assessments and interventions performed:     CCM Care Plan  Allergies  Allergen Reactions   Pneumovax 23 [Pneumococcal Vac Polyvalent] Other (See Comments)    Pt had severe arm pain   Amaryl [Glimepiride] Nausea Only   Cephalexin Hives   Sulfa Antibiotics Rash    Medications Reviewed Today     Reviewed by Cherre Robins, RPH-CPP (Pharmacist) on 01/13/22 at [place] List Status: <None>   Medication Order Taking? Sig Documenting Provider Last Dose Status Informant  Ascorbic Acid (VITAMIN C) 1000 MG tablet [id number] No Take 1,000 mg by mouth daily. [provider] Taking Active   atorvastatin (LIPITOR) 20 MG tablet [id number] No TAKE ONE TABLET BY MOUTH ONCE DAILY Copland, Gay Filler, MD Taking Active   B Complex-C (SUPER B COMPLEX PO) [id number] No Take 1 tablet by mouth daily. [provider] Taking Active   blood glucose meter kit and supplies [id number] No Dispense based on patient and insurance preference. Use up to four times daily as directed. (FOR ICD-10 E10.9, E11.9). Copland, Gay Filler, MD Taking Active   Blood Glucose Monitoring Suppl (ONE TOUCH ULTRA 2) w/Device KIT [id number] No by Does not apply route. [provider] Taking Active   Cholecalciferol (VITAMIN D3) 50 MCG (2000 UT) CAPS [id number] No Take 1 capsule by mouth daily. [provider] Taking Active   FARXIGA 10 MG TABS tablet [id number] No Take 1 tablet (10 mg total) by mouth daily. For [place] and Me assistance program Copland, Gay Filler, MD Taking Active   FLUoxetine (PROZAC) 20 MG capsule [id number]  TAKE ONE CAPSULE BY MOUTH DAILY Copland, Gay Filler, MD  Active   fluticasone (FLONASE) 50 MCG/ACT nasal spray [id number] No Place 2 sprays into both nostrils daily. Saguier, Percell Miller, PA-C Taking Active    glucose blood test strip [id number] No 1 each by Other route as needed for other. Use as instructed [provider] Taking Active   losartan (COZAAR) 25 MG tablet [id number] No Take 1 tablet (25 mg total) by mouth daily. Copland, Gay Filler, MD Taking Active   methocarbamol (ROBAXIN) 500 MG tablet [id number] No Take 1 tablet (500 mg total) by mouth every 8 (eight) hours as needed for muscle spasms. Copland, Gay Filler, MD Taking Active   Multiple Vitamins-Minerals (ALIVE WOMENS 50+) TABS [id number] No Take 1 tablet by mouth daily. [provider] Taking Active   nitroGLYCERIN (NITROSTAT) 0.4 MG SL tablet [id number] No PLACE  1 TABLET UNDER THE TONGUE EVERY 5 MINUTES AS NEEDED FOR CHEST PAIN Copland, Gay Filler, MD Taking Active            Med Note De Blanch   Fri Jun 12, 2020  4:22 PM) Has not had to use, but has it just in case  omeprazole (PRILOSEC) 20 MG capsule [id number] No TAKE ONE CAPSULE BY MOUTH ONCE DAILY Copland, Gay Filler, MD Taking Active   OneTouch Delica Lancets 07P MISC [id number] No Use to check glucose up to 4 times daily. Dx Code E11.9 Copland, Gay Filler, MD Taking Active   propranolol (INDERAL) 10 MG tablet [id number] No TAKE ONE TABLET BY MOUTH EVERY EIGHT TO TWELVE HOURS AS NEEDED FOR tremors Copland, Gay Filler, MD Taking Active   Semaglutide, 2 MG/DOSE, (OZEMPIC, 2 MG/DOSE,) 8 MG/3ML SOPN [id number] No Inject 2 mg into the skin once a week. Copland, Gay Filler, MD Taking Active            Med Note Barbaraann Boys Dec 20, 2021  8:48 AM) Approved thur [employer] patient assistance program thru 04/02/2022  traZODone (DESYREL) 150 MG tablet [id number] No TAKE ONE TABLET BY MOUTH AT bedtime Copland, Gay Filler, MD Taking Active   zinc gluconate 50 MG tablet [id number] No Take 50 mg by mouth daily. [provider] Taking Active             Patient Active Problem List   Diagnosis Date Noted   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/29/2020   Essential hypertension     Gastroesophageal reflux disease without esophagitis    Microscopic hematuria 09/16/2015   Trimalleolar fracture of ankle, closed 11/17/2014   Hyperlipidemia associated with type 2 diabetes mellitus ([place]) 07/24/2013   Chest pain 10/23/2012   Type 2 diabetes mellitus without complication, without long-term current use of insulin ([place]) 10/08/2012   Anxiety 10/08/2012    Immunization History  Administered Date(s) Administered   Fluad Quad(high Dose 65+) 07/01/2019   Influenza Whole 08/08/2013   Influenza, High Dose Seasonal PF 08/03/2016, 08/17/2017   Influenza, Seasonal, Injecte, Preservative Fre 10/08/2012   Influenza,inj,Quad PF,6+ Mos 08/05/2015   Influenza-Unspecified 08/08/2014, 08/13/2020, 07/31/2021   PFIZER(Purple Top)SARS-COV-2 Vaccination 01/13/2020, 02/12/2020, 08/13/2020   Pneumococcal Conjugate-13 11/03/2014   Pneumococcal Polysaccharide-23 02/10/2016   Td 12/06/2021   Zoster Recombinat (Shingrix) 09/13/2018    Conditions to be addressed/monitored: HTN, HLD, DMII, and GERD  Care Plan : General Pharmacy (Adult)  Updates made by Cherre Robins, RPH-CPP since 01/13/2022 12:00 AM     Problem: Chronic Disease Management support, education, and care coordination needs related to Diabetes, HTN, Hyperlipidemia, Chest Pain, Anxiety, Tremor, Insomnia, GERD   Priority: High  Onset Date: 01/25/2021     Long-Range Goal: Management of medicaiton and chronic conditions   Start Date: 01/25/2021  Recent Progress: On track  Priority: High  Note:   Current Barriers:  Unable to independently afford treatment regimen Chronic Disease Management support, education, and care coordination needs related to Diabetes, Hypertension, Hyperlipidemia, Chest Pain, Anxiety, Tremor, Insomnia, GERD  Pharmacist Clinical Goal(s):  Over the next 180 days, patient will achieve adherence to monitoring guidelines and medication adherence to achieve therapeutic efficacy Complete application for [place] and  Ozempic  through collaboration with PharmD and provider.   Interventions: 1:1 collaboration with Copland, Gay Filler, MD regarding development and update of comprehensive plan of care as evidenced by provider attestation and co-signature Inter-disciplinary care team collaboration (see longitudinal plan of care) Comprehensive medication review performed; medication  list updated in electronic medical record   Hypertension Controlled; BP goal <140/90 Current regimen:  Losartan 80m daily Reviewed refill history - last filled 90 days supply 01/07/2022 Pharmacist Interventions: (addressed at previous visit)  Maintain hypertension medication regimen Discussed blood pressure goal  Hyperlipidemia At goal of LDL < 100 Current regimen:  Atorvastatin 266mdaily Pharmacist Interventions Maintain current cholesterol medication regimen. Revived refill history - last filled atorvastatin for 90 days 01/07/2022  Diabetes Improving; A1c goal <7% Last A1c was 7.1% (11/2021) Current regimen:  Ozempic 42m56meekly  Farxiga 85m63mily (added by Dr GoldMoshe Cipro blood glucose and kidney function) Patient received Ozempic from [employer] assistance in 2022. She report she currently has about 3 pens on hand. She has been approved for 2023 thru 03/29/2022 but due to shipping delays has not received shipment in 2023 yet. Receiving Farigra form [employer] assistance program - has been re-enrolled in thru 11/06/2022 Recent home BG readings: 130 to 190; Mostly in 140's.  Diet: avoiding sugar containing beverages, limits intake of high carbohydrate foods such as pasta and bread Exercise: walks her dog twice a day (about 30 minutes total)  Pharmacist Interventions: [employer] assistance program. Patient's next OzemRoss Storespending. Unable to speak to representative after holding for over 30 minutes. Left message for return call.  Patient has 3 pens on hand. Verified  she is approved thru 04/02/2022. Recommended she continue to check blood glucose daily, document, and provide at future appointments Continue to limit intake of sugar and food high in carbohydrates.  Continue to exercise daily Reminded to get yearly eye exam ( patient has appointment scheduled for 02/2022 - soonest she could get)   Health Maintenance:  Reviewed vaccination history and discussed benefits of Shingrix  Patient to get the following vaccines in 2023 - Shingrix  Medication management Current pharmacy: [facility] Comprehensive medication review performed. Updated medication list.  Reviewed refill history and assessed adherence Continue to utilize pharmacy for medication synchronization, packaging and delivery  Patient Goals/Self-Care Activities Over the next 180 days, patient will:  take medications as prescribed  Continue to check blood glucose 1 to 2 times per day and record for future visits.  collaborate with provider on medication access solutions  Follow Up Plan: Telephone follow up appointment with clinical pharmacist in 1 month to check on 2023 patient assistance paperwork   Please see past updates related to this goal by clicking on the "Past Updates" button in the selected goal       Medication Assistance:  Farxiga obtained through [place] [facility]  medication [facility].  Enrollment ends 12/339/03/0092lication for 20233300ient assistance program for NovoNordisk / Ozempic has been faxed to [employer] patient assistance program. Expecting shipment in 7 to 10 days.   Patient's preferred pharmacy is:  [facility] [address]: [phone]: [phone] [place] [place]: [phone]: [phone] [address] [address]: [phone]: [phone] [address] [address]ne: [phone]: [address]-[place]  Phone: [phone] Fax: [place], [place]. [place] [address] Phone: [phone] Fax: [phone]    Follow Up:  Patient agrees to Care Plan and Follow-up.  Plan: Telephone follow up appointment with care management team member scheduled for:  1 month  Cherre Robins, PharmD Clinical Pharmacist [facility]

## 2022-02-04 DIAGNOSIS — E119 Type 2 diabetes mellitus without complications: Secondary | ICD-10-CM

## 2022-02-04 DIAGNOSIS — I10 Essential (primary) hypertension: Secondary | ICD-10-CM

## 2022-02-04 DIAGNOSIS — E1169 Type 2 diabetes mellitus with other specified complication: Secondary | ICD-10-CM

## 2022-02-04 DIAGNOSIS — E785 Hyperlipidemia, unspecified: Secondary | ICD-10-CM

## 2022-02-08 DIAGNOSIS — N1832 Chronic kidney disease, stage 3b: Secondary | ICD-10-CM | POA: Diagnosis not present

## 2022-02-08 DIAGNOSIS — E1122 Type 2 diabetes mellitus with diabetic chronic kidney disease: Secondary | ICD-10-CM | POA: Diagnosis not present

## 2022-02-08 DIAGNOSIS — I129 Hypertensive chronic kidney disease with stage 1 through stage 4 chronic kidney disease, or unspecified chronic kidney disease: Secondary | ICD-10-CM | POA: Diagnosis not present

## 2022-03-02 DIAGNOSIS — Z85828 Personal history of other malignant neoplasm of skin: Secondary | ICD-10-CM | POA: Diagnosis not present

## 2022-03-02 DIAGNOSIS — L821 Other seborrheic keratosis: Secondary | ICD-10-CM | POA: Diagnosis not present

## 2022-03-02 DIAGNOSIS — L814 Other melanin hyperpigmentation: Secondary | ICD-10-CM | POA: Diagnosis not present

## 2022-03-02 DIAGNOSIS — L570 Actinic keratosis: Secondary | ICD-10-CM | POA: Diagnosis not present

## 2022-03-02 DIAGNOSIS — L820 Inflamed seborrheic keratosis: Secondary | ICD-10-CM | POA: Diagnosis not present

## 2022-03-03 ENCOUNTER — Telehealth: Payer: PPO

## 2022-03-03 LAB — HM DIABETES EYE EXAM

## 2022-03-04 ENCOUNTER — Encounter: Payer: Self-pay | Admitting: *Deleted

## 2022-03-08 ENCOUNTER — Ambulatory Visit (INDEPENDENT_AMBULATORY_CARE_PROVIDER_SITE_OTHER): Payer: PPO | Admitting: Pharmacist

## 2022-03-08 DIAGNOSIS — I10 Essential (primary) hypertension: Secondary | ICD-10-CM

## 2022-03-08 DIAGNOSIS — E1169 Type 2 diabetes mellitus with other specified complication: Secondary | ICD-10-CM

## 2022-03-08 DIAGNOSIS — E119 Type 2 diabetes mellitus without complications: Secondary | ICD-10-CM

## 2022-03-08 NOTE — Patient Instructions (Signed)
Abigail [name] ?It was a pleasure speaking with you  ?Below is a summary of your health goals and care plan ? ?Patient Goals/Self-Care Activities ?take medications as prescribed  ?Continue to check blood glucose 1 to 2 times per day and record for future visits.  ?collaborate with provider on medication access solutions ? ?Follow Up Plan: Telephone follow up appointment with clinical pharmacist in 3 months ? ?If you have any questions or concerns, please feel free to contact me either at the phone number below or with a MyChart message.  ? ?Keep up the good work! ? ?Cherre Robins, PharmD ?Clinical Pharmacist ?[facility] ?[place] ?[phone] (direct line)  ?[phone] (main office number) ? ? ?Patient verbalizes understanding of instructions and care plan provided today and agrees to view in [place]. Active MyChart status confirmed with patient.    ?

## 2022-03-08 NOTE — Chronic Care Management (AMB) (Signed)
? ? ?Chronic Care Management ?Pharmacy Note ? ?03/08/2022 ?Name:  Abigail [name] MRN:  [id number] DOB:  14-Feb-1951 ? ?Summary:  ?Checked on request from 11/2021 for Ozempic refill form [employer] patient assistance program. Patient approved but only thru 03/29/2022. They have shipped 120day supply and should arrive in 1 to 2 days in our office. Will supply patient with new application.  ?Assisted in requesting refill for fluoxetine.  ?Updated immunization history to include Shingrix vaccination ? ?Subjective: ?Abigail [name] is an 71 y.o. year old female who is a primary patient of Copland, Gay Filler, MD.  The CCM team was consulted for assistance with disease management and care coordination needs.   ? ?Engaged with patient by telephone for follow up visit in response to provider referral for pharmacy case management and/or care coordination services.  ? ?Consent to Services:  ?The patient was given information about Chronic Care Management services, agreed to services, and gave verbal consent prior to initiation of services.  Please see initial visit note for detailed documentation.  ? ?Patient Care Team: ?Copland, Gay Filler, MD as PCP - General (Family Medicine) ?Leonie Man, MD as PCP - Cardiology (Cardiology) ?Cherre Robins, RPH-CPP (Pharmacist) ?Macarthur Critchley, OD as Referring Physician (Optometry) ? ?Recent office visits: ?12/06/2021 - PCP (Dr Lorelei Pont) Follow up DM and right flank pain. No medication changes. Labs checked.  ?07/08/2021 - PCP (Dr Lorelei Pont) no changes in medications ? ?Recent consult visits: ?07/2021 - Nephrologist (Dr Moshe Cipro)  ? ?Hospital visits: ?None in previous 6 months ? ?Objective: ? ?Lab Results  ?Component Value Date  ? CREATININE 1.53 (H) 12/06/2021  ? CREATININE 1.5 (A) 07/28/2021  ? CREATININE 1.6 (A) 03/31/2021  ? ? ?Lab Results  ?Component Value Date  ? HGBA1C 7.1 (H) 12/06/2021  ? ?Last diabetic Eye exam:  ?Lab Results  ?Component Value Date/Time  ? HMDIABEYEEXA No  Retinopathy 03/03/2022 12:00 AM  ?  ?Last diabetic Foot exam: No results found for: HMDIABFOOTEX  ? ?   ?Component Value Date/Time  ? CHOL 148 12/06/2021 1347  ? TRIG 168.0 (H) 12/06/2021 1347  ? HDL 48.40 12/06/2021 1347  ? CHOLHDL 3 12/06/2021 1347  ? VLDL 33.6 12/06/2021 1347  ? [place] 66 12/06/2021 1347  ? ? ? ?  Latest Ref Rng & Units 12/06/2021  ?  1:47 PM 03/31/2021  ? 12:00 AM 12/21/2020  ? 12:00 AM  ?Hepatic Function  ?Total Protein 6.0 - 8.3 g/dL 6.6      ?Albumin 3.5 - 5.2 g/dL 4.2   4.4      4.3       ?AST 0 - 37 U/L 14      ?ALT 0 - 35 U/L 12      ?Alk Phosphatase 39 - 117 U/L 90      ?Total Bilirubin 0.2 - 1.2 mg/dL 0.4      ?  ? This result is from an external source.  ? ? ?Lab Results  ?Component Value Date/Time  ? TSH 1.54 12/06/2021 01:47 PM  ? TSH 2.16 02/24/2020 03:52 PM  ? FREET4 0.91 03/17/2016 11:07 AM  ? ? ? ?  Latest Ref Rng & Units 12/06/2021  ?  1:47 PM 07/28/2021  ? 12:00 AM 03/31/2021  ? 12:00 AM  ?CBC  ?WBC 4.0 - 10.5 K/uL 7.9      ?Hemoglobin 12.0 - 15.0 g/dL 12.7   12.8      12.9       ?Hematocrit 36.0 - 46.0 % 38.6      ?  Platelets 150.0 - 400.0 K/uL 254.0      ?  ? This result is from an external source.  ? ? ?Lab Results  ?Component Value Date/Time  ? VD25OH 47.16 12/06/2021 01:47 PM  ? VD25OH 37.36 02/24/2020 03:52 PM  ? ? ?Clinical ASCVD: No  ?The 10-year ASCVD risk score (Arnett DK, et al., 2019) is: 17.1% ?  Values used to calculate the score: ?    Age: 71 years ?    Sex: Female ?    Is Non-Hispanic African American: No ?    Diabetic: Yes ?    Tobacco smoker: No ?    Systolic Blood Pressure: 353 mmHg ?    Is BP treated: Yes ?    HDL Cholesterol: 48.4 mg/dL ?    Total Cholesterol: 148 mg/dL   ? ? ?Social History  ? ?Tobacco Use  ?Smoking Status Never  ?Smokeless Tobacco Never  ? ?BP Readings from Last 3 Encounters:  ?12/06/21 100/62  ?05/03/21 118/70  ?04/08/21 118/72  ? ?Pulse Readings from Last 3 Encounters:  ?12/06/21 96  ?05/03/21 86  ?04/08/21 82  ? ?Wt Readings from Last 3  Encounters:  ?12/06/21 143 lb (64.9 kg)  ?05/05/21 143 lb (64.9 kg)  ?05/03/21 145 lb (65.8 kg)  ? ? ?Assessment: Review of patient past medical history, allergies, medications, health status, including review of consultants reports, laboratory and other test data, was performed as part of comprehensive evaluation and provision of chronic care management services.  ? ?SDOH:  (Social Determinants of Health) assessments and interventions performed:  ? ? ? ?CCM Care Plan ? ?Allergies  ?Allergen Reactions  ? Pneumovax 23 [Pneumococcal Vac Polyvalent] Other (See Comments)  ?  Pt had severe arm pain  ? Amaryl [Glimepiride] Nausea Only  ? Cephalexin Hives  ? Sulfa Antibiotics Rash  ? ? ?Medications Reviewed Today   ? ? Reviewed by Cherre Robins, RPH-CPP (Pharmacist) on 03/08/22 at 73  Med List Status: <None>  ? ?Medication Order Taking? Sig Documenting Provider Last Dose Status Informant  ?Ascorbic Acid (VITAMIN C) 1000 MG tablet [id number] Yes Take 1,000 mg by mouth daily. [provider] Taking Active   ?atorvastatin (LIPITOR) 20 MG tablet [id number] Yes TAKE ONE TABLET BY MOUTH ONCE DAILY Copland, Gay Filler, MD Taking Active   ?B Complex-C (SUPER B COMPLEX PO) [id number] Yes Take 1 tablet by mouth daily. [provider] Taking Active   ?blood glucose meter kit and supplies [id number] Yes Dispense based on patient and insurance preference. Use up to four times daily as directed. (FOR ICD-10 E10.9, E11.9). Copland, Gay Filler, MD Taking Active   ?Blood Glucose Monitoring Suppl (ONE TOUCH ULTRA 2) w/Device KIT [id number] Yes by Does not apply route. [provider] Taking Active   ?Cholecalciferol (VITAMIN D3) 50 MCG (2000 UT) CAPS [id number] Yes Take 1 capsule by mouth daily. [provider] Taking Active   ?FARXIGA 10 MG TABS tablet [id number] Yes Take 1 tablet (10 mg total) by mouth daily. For [place] and Me assistance program Copland, Gay Filler, MD Taking Active   ?FLUoxetine (PROZAC) 20 MG  capsule [id number] Yes TAKE ONE CAPSULE BY MOUTH DAILY Copland, Gay Filler, MD Taking Active   ?fluticasone (FLONASE) 50 MCG/ACT nasal spray [id number] Yes Place 2 sprays into both nostrils daily. Saguier, Percell Miller, PA-C Taking Active   ?glucose blood test strip [id number] Yes 1 each by Other route as needed for other. Use as instructed [provider] Taking Active   ?losartan (COZAAR) 25  MG tablet [id number] Yes Take 1 tablet (25 mg total) by mouth daily. Copland, Gay Filler, MD Taking Active   ?Multiple Vitamins-Minerals (ALIVE WOMENS 50+) TABS [id number] Yes Take 1 tablet by mouth daily. [provider] Taking Active   ?nitroGLYCERIN (NITROSTAT) 0.4 MG SL tablet [id number] Yes PLACE 1 TABLET UNDER THE TONGUE EVERY 5 MINUTES AS NEEDED FOR CHEST PAIN Copland, Gay Filler, MD Taking Active   ?         ?Med Note De Blanch   Fri Jun 12, 2020  4:22 PM) Has not had to use, but has it just in case  ?omeprazole (PRILOSEC) 20 MG capsule [id number] Yes TAKE ONE CAPSULE BY MOUTH ONCE DAILY Copland, Gay Filler, MD Taking Active   ?OneTouch Delica Lancets 65L MISC [id number] Yes Use to check glucose up to 4 times daily. Dx Code E11.9 Copland, Gay Filler, MD Taking Active   ?propranolol (INDERAL) 10 MG tablet [id number] Yes TAKE ONE TABLET BY MOUTH EVERY EIGHT TO TWELVE HOURS AS NEEDED FOR tremors Copland, Gay Filler, MD Taking Active   ?Semaglutide, 2 MG/DOSE, (OZEMPIC, 2 MG/DOSE,) 8 MG/3ML SOPN [id number] Yes Inject 2 mg into the skin once a week. Copland, Gay Filler, MD Taking Active   ?         ?Med Note Barbaraann Boys Dec 20, 2021  8:48 AM) Approved thur [employer] patient assistance program thru 04/02/2022  ?traZODone (DESYREL) 150 MG tablet [id number] Yes TAKE ONE TABLET BY MOUTH AT bedtime Copland, Gay Filler, MD Taking Active   ?zinc gluconate 50 MG tablet [id number] Yes Take 50 mg by mouth daily. [provider] Taking Active   ? ?  ?  ? ?  ? ? ?Patient Active Problem List  ? Diagnosis Date Noted  ?  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/29/2020  ? Essential hypertension   ? Gastroesophageal reflux disease without esophagitis   ? Microscopic hematuria 09/16/2015  ? Trimalleolar fracture of ankle, closed 11/17/2014  ? Hyperlipidemi

## 2022-03-09 ENCOUNTER — Telehealth: Payer: Self-pay | Admitting: Pharmacist

## 2022-03-09 NOTE — Telephone Encounter (Signed)
Received new application. Does the pt need to submit any financial statements with this? The top of the form says "Program Refill/Reorder/Change Request.   ?

## 2022-03-09 NOTE — Telephone Encounter (Signed)
Received medication assistance program Ozempic from [employer] for patient. Patient was notified that we have medication. She is leaving to go out of town today but will pick up next week.  ?Also reminded that she will need to submit a new application prior to next medication assistance program refill. Application left in bag with Ozempic. ?

## 2022-03-10 NOTE — Telephone Encounter (Signed)
Form given to TE.  ?

## 2022-03-28 ENCOUNTER — Other Ambulatory Visit: Payer: Self-pay | Admitting: Family Medicine

## 2022-03-28 DIAGNOSIS — I10 Essential (primary) hypertension: Secondary | ICD-10-CM

## 2022-03-28 DIAGNOSIS — K219 Gastro-esophageal reflux disease without esophagitis: Secondary | ICD-10-CM

## 2022-04-06 ENCOUNTER — Other Ambulatory Visit: Payer: Self-pay | Admitting: Family Medicine

## 2022-04-06 ENCOUNTER — Telehealth: Payer: Self-pay | Admitting: Family Medicine

## 2022-04-06 DIAGNOSIS — E785 Hyperlipidemia, unspecified: Secondary | ICD-10-CM

## 2022-04-06 DIAGNOSIS — Z7984 Long term (current) use of oral hypoglycemic drugs: Secondary | ICD-10-CM

## 2022-04-06 DIAGNOSIS — E1169 Type 2 diabetes mellitus with other specified complication: Secondary | ICD-10-CM | POA: Diagnosis not present

## 2022-04-06 DIAGNOSIS — Z7985 Long-term (current) use of injectable non-insulin antidiabetic drugs: Secondary | ICD-10-CM

## 2022-04-06 DIAGNOSIS — I10 Essential (primary) hypertension: Secondary | ICD-10-CM | POA: Diagnosis not present

## 2022-04-06 DIAGNOSIS — G250 Essential tremor: Secondary | ICD-10-CM

## 2022-04-06 NOTE — Telephone Encounter (Signed)
Pt dropped off document for pharmacist to verify and submit [employer] 8 pages), pt stated if any extra info is needed to let pt know. Document put at front office tray under pharmacist.

## 2022-04-07 NOTE — Telephone Encounter (Signed)
Reviewed patient assistance program application for Ozempic. Completed application. Forwarded to PCP to review and sign.

## 2022-04-07 NOTE — Telephone Encounter (Signed)
Received patient assistance program application back from Dr Lorelei Pont. Faxed to [employer] patient assistance program.

## 2022-05-09 NOTE — Progress Notes (Unsigned)
Subjective:   Abigail [name] is a 71 y.o. female who presents for Medicare Annual (Subsequent) preventive examination.  Review of Systems           Objective:    There were no vitals filed for this visit. There is no height or weight on file to calculate BMI.     05/05/2021    9:41 AM 12/18/2020   10:11 AM 05/04/2020    2:45 PM 08/17/2017    5:27 PM 08/17/2017   11:19 AM 06/01/2015    2:18 PM 11/18/2014    8:00 AM  Advanced Directives  Does Patient Have a Medical Advance Directive? No No Yes No No No No  Type of Advance Directive   Living will      Does patient want to make changes to medical advance directive?   No - Patient declined      Would patient like information on creating a medical advance directive? Yes (MAU/Ambulatory/Procedural Areas - Information given) No - Patient declined  No - Patient declined  No - patient declined information Yes - Educational materials given    Current Medications (verified) Outpatient Encounter Medications as of 05/11/2022  Medication Sig   Ascorbic Acid (VITAMIN C) 1000 MG tablet Take 1,000 mg by mouth daily.   atorvastatin (LIPITOR) 20 MG tablet TAKE ONE TABLET BY MOUTH ONCE DAILY   B Complex-C (SUPER B COMPLEX PO) Take 1 tablet by mouth daily.   blood glucose meter kit and supplies Dispense based on patient and insurance preference. Use up to four times daily as directed. (FOR ICD-10 E10.9, E11.9).   Blood Glucose Monitoring Suppl (ONE TOUCH ULTRA 2) w/Device KIT by Does not apply route.   Cholecalciferol (VITAMIN D3) 50 MCG (2000 UT) CAPS Take 1 capsule by mouth daily.   FARXIGA 10 MG TABS tablet Take 1 tablet (10 mg total) by mouth daily. For [place] and [place] assistance program   FLUoxetine (PROZAC) 20 MG capsule TAKE ONE CAPSULE BY MOUTH DAILY   fluticasone (FLONASE) 50 MCG/ACT nasal spray Place 2 sprays into both nostrils daily.   glucose blood test strip 1 each by Other route as needed for other. Use as instructed   losartan (COZAAR)  25 MG tablet TAKE ONE TABLET BY MOUTH ONCE DAILY   Multiple Vitamins-Minerals (ALIVE WOMENS 50+) TABS Take 1 tablet by mouth daily.   nitroGLYCERIN (NITROSTAT) 0.4 MG SL tablet PLACE 1 TABLET UNDER THE TONGUE EVERY 5 MINUTES AS NEEDED FOR CHEST PAIN   omeprazole (PRILOSEC) 20 MG capsule TAKE ONE CAPSULE BY MOUTH ONCE DAILY   OneTouch Delica Lancets 74Y MISC Use to check glucose up to 4 times daily. Dx Code E11.9   propranolol (INDERAL) 10 MG tablet TAKE ONE TABLET BY MOUTH every EIGHT TO 12 hours AS NEEDED FOR tremors. Needs appointment for further refills   Semaglutide, 2 MG/DOSE, (OZEMPIC, 2 MG/DOSE,) 8 MG/3ML SOPN Inject 2 mg into the skin once a week.   traZODone (DESYREL) 150 MG tablet TAKE ONE TABLET BY MOUTH AT bedtime   zinc gluconate 50 MG tablet Take 50 mg by mouth daily.   No facility-administered encounter medications on file as of 05/11/2022.    Allergies (verified) Pneumovax 23 [pneumococcal vac polyvalent], Amaryl [glimepiride], Cephalexin, and Sulfa antibiotics   History: Past Medical History:  Diagnosis Date   Anemia, unspecified    Anxiety    CKD (chronic kidney disease), stage III (HCC)    Depression    Diabetes mellitus type II, non insulin dependent (  [place])    On Ozempic and Farxiga   GERD (gastroesophageal reflux disease)    Hematuria    Hyperlipidemia    Hypertension    Mild mitral regurgitation 08/2017   Essentially normal-mild MR on echo.   Normal coronary arteries    Coronary CTA: No evidence of CAD.  Coronary calcium score 0.   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   Past Surgical History:  Procedure Laterality Date   CHOLECYSTECTOMY N/A 06/03/2015   Procedure: LAPAROSCOPIC CHOLECYSTECTOMY WITH INTRAOPERATIVE CHOLANGIOGRAM;  Surgeon: Donnie Mesa, MD;  Location: [facility];  Service: General;  Laterality: N/A;   COLONOSCOPY     HAMMER TOE SURGERY     ORIF ANKLE FRACTURE Left 11/17/2014   Procedure: OPEN REDUCTION INTERNAL FIXATION (ORIF)  LEFT TRIMALLEOLAR ANKLE FRACTURE;  Surgeon: Marianna Payment, MD;  Location: [facility];  Service: Orthopedics;  Laterality: Left;   OVARIAN CYST REMOVAL     Family History  Problem Relation Age of Onset   Diabetes Mother    Hyperlipidemia Mother    Diabetes Father    Heart disease Father 15       AMI x 2; first AMI age 64   Stroke Father    Hypertension Father    Hyperlipidemia Brother    AAA (abdominal aortic aneurysm) Brother    Congestive Heart Failure Brother        s/p ICD   Coronary artery disease Brother        defibrillator; CHF   Kidney cancer Brother        Status post nephrectomy   Other Sister 30       Died in a car accident   Social History   Socioeconomic History   Marital status: Widowed    Spouse name: Not on file   Number of children: 2   Years of education: Not on file   Highest education level: High school graduate  Occupational History   Occupation: Buyer, retail    Comment: Retired-Worked at [employer] Use   Smoking status: Never   Smokeless tobacco: Never  Vaping Use   Vaping Use: Never used  Substance and Sexual Activity   Alcohol use: Yes    Alcohol/week: 1.0 standard drink of alcohol    Types: 1 Glasses of wine per week   Drug use: No   Sexual activity: Yes    Birth control/protection: None  Other Topics Concern   Not on file  Social History Narrative   Marital status: married x 1970. ->  Now widowed      Children:  2 children; 7 grandchildren.      Employment:  [employer]. ->  Retired      Tobacco: none       Alcohol: sporadic       Exercises: walking 7 days per week.  20-30 minutes.  Walks her dog.   Social Determinants of Health   Financial Resource Strain: Medium Risk (11/12/2021)   Overall Financial Resource Strain (CARDIA)    Difficulty of Paying Living Expenses: Somewhat hard  Food Insecurity: No Food Insecurity (05/04/2020)   Hunger Vital Sign    Worried About Running Out of Food in the  Last Year: Never true    Ran Out of Food in the Last Year: Never true  Transportation Needs: No Transportation Needs (09/07/2021)   PRAPARE - Hydrologist (Medical): No    Lack of Transportation (Non-Medical): No  Physical  Activity: Sufficiently Active (05/05/2021)   Exercise Vital Sign    Days of Exercise per Week: 7 days    Minutes of Exercise per Session: 40 min  Stress: No Stress Concern Present (05/05/2021)   [place]    Feeling of Stress : Only a little  Social Connections: Moderately Isolated (05/05/2021)   Social Connection and Isolation Panel [NHANES]    Frequency of Communication with Friends and Family: More than three times a week    Frequency of Social Gatherings with Friends and Family: More than three times a week    Attends Religious Services: More than 4 times per year    Active Member of [employer] or Organizations: No    Attends Archivist Meetings: Never    Marital Status: Widowed    Tobacco Counseling Counseling given: Not Answered   Clinical Intake:                 Diabetic?yes Nutrition Risk Assessment:  Has the patient had any N/V/D within the last 2 months?  {YES/NO:21197} Does the patient have any non-healing wounds?  {YES/NO:21197} Has the patient had any unintentional weight loss or weight gain?  {YES/NO:21197}  Diabetes:  Is the patient diabetic?  {YES/NO:21197} If diabetic, was a CBG obtained today?  {YES/NO:21197} Did the patient bring in their glucometer from home?  {YES/NO:21197} How often do you monitor your CBG's? ***.   Financial Strains and Diabetes Management:  Are you having any financial strains with the device, your supplies or your medication? {YES/NO:21197}.  Does the patient want to be seen by Chronic Care Management for management of their diabetes?  {YES/NO:21197} Would the patient like to be referred to a Nutritionist  or for Diabetic Management?  {YES/NO:21197}  Diabetic Exams:  Diabetic Eye Exam: Completed 03/03/22 Diabetic Foot Exam: Completed 12/06/21           Activities of Daily Living     No data to display          Patient Care Team: Copland, Gay Filler, MD as PCP - General (Family Medicine) Leonie Man, MD as PCP - Cardiology (Cardiology) Cherre Robins, [place] (Pharmacist) Macarthur Critchley, [place] as Referring Physician (Optometry)  Indicate any recent Medical Services you may have received from other than [facility] providers in the past year (date may be approximate).     Assessment:   This is a routine wellness examination for [place].  Hearing/Vision screen No results found.  Dietary issues and exercise activities discussed:     Goals Addressed   None    Depression Screen    12/06/2021    1:28 PM 05/05/2021    9:45 AM 04/08/2021    9:46 AM 10/26/2020    4:57 PM 05/04/2020    2:48 PM 08/17/2017   10:14 AM 02/10/2016   10:15 AM  PHQ 2/9 Scores  PHQ - 2 Score 0 0 2 2 0 0 0  PHQ- 9 Score 0   4       Fall Risk    05/05/2021    9:44 AM 04/08/2021    9:47 AM 05/04/2020    2:48 PM 09/25/2018    9:08 AM 08/17/2017   10:14 AM  Fall Risk   Falls in the past year? 0 0 0 0 No  Comment    [facility] Telephone Survey: data to providers prior to load   Number falls in past yr: 0  0    Injury with  Fall? 0  0    Follow up Falls prevention discussed  Education provided;Falls prevention discussed      FALL RISK PREVENTION PERTAINING TO THE HOME:  Any stairs in or around the home? {YES/NO:21197} If so, are there any without handrails? {YES/NO:21197} Home free of loose throw rugs in walkways, pet beds, electrical cords, etc? {YES/NO:21197} Adequate lighting in your home to reduce risk of falls? {YES/NO:21197}  ASSISTIVE DEVICES UTILIZED TO PREVENT FALLS:  Life alert? {YES/NO:21197} Use of a cane, walker or w/c? {YES/NO:21197} Grab bars in the bathroom? {YES/NO:21197} Shower chair or  bench in shower? {YES/NO:21197} Elevated toilet seat or a handicapped toilet? {YES/NO:21197}  TIMED UP AND GO:  Was the test performed? {YES/NO:21197}.  Length of time to ambulate 10 feet: *** sec.   {Appearance of NGEX:5284132}  Cognitive Function:        Immunizations Immunization History  Administered Date(s) Administered   Fluad Quad(high Dose 65+) 07/01/2019   Influenza Whole 08/08/2013   Influenza, High Dose Seasonal PF 08/03/2016, 08/17/2017   Influenza, Seasonal, Injecte, Preservative Fre 10/08/2012   Influenza,inj,Quad PF,6+ Mos 08/05/2015   Influenza-Unspecified 08/08/2014, 08/13/2020, 07/31/2021   PFIZER(Purple Top)SARS-COV-2 Vaccination 01/13/2020, 02/12/2020, 08/13/2020   Pneumococcal Conjugate-13 11/03/2014   Pneumococcal Polysaccharide-23 02/10/2016   Td 12/06/2021   Zoster Recombinat (Shingrix) 09/13/2018, 10/05/2020    TDAP status: Up to date  Flu Vaccine status: Up to date  Pneumococcal vaccine status: Up to date  {Covid-19 vaccine status:2101808}  Qualifies for Shingles Vaccine? Yes   Zostavax completed No   Shingrix Completed?: Yes  Screening Tests Health Maintenance  Topic Date Due   COVID-19 Vaccine (4 - Booster for Pfizer series) 10/08/2020   HEMOGLOBIN A1C  06/05/2022   INFLUENZA VACCINE  06/07/2022   FOOT EXAM  12/06/2022   OPHTHALMOLOGY EXAM  03/04/2023   COLONOSCOPY (Pts 45-71yrs Insurance coverage will need to be confirmed)  06/20/2023   MAMMOGRAM  08/10/2023   TETANUS/TDAP  12/07/2031   Pneumonia Vaccine 22+ Years old  Completed   DEXA SCAN  Completed   Hepatitis C Screening  Completed   Zoster Vaccines- Shingrix  Completed   HPV VACCINES  Aged Out    Health Maintenance  Health Maintenance Due  Topic Date Due   COVID-19 Vaccine (4 - Booster for [place] series) 10/08/2020    Colorectal cancer screening: Type of screening: Colonoscopy. Completed 06/19/18. Repeat every 5 years  Mammogram status: Completed 08/09/21. Repeat  every year  {Bone Density status:21018021}  Lung Cancer Screening: (Low Dose CT Chest recommended if Age 89-80 years, 30 pack-year currently smoking OR have quit w/in 15years.) does not qualify.   Lung Cancer Screening Referral: n/a  Additional Screening:  Hepatitis C Screening: does qualify; Completed 08/05/15  Vision Screening: Recommended annual ophthalmology exams for early detection of glaucoma and other disorders of the eye. Is the patient up to date with their annual eye exam?  {YES/NO:21197} Who is the provider or what is the name of the office in which the patient attends annual eye exams? *** If pt is not established with a provider, would they like to be referred to a provider to establish care? {YES/NO:21197}.   Dental Screening: Recommended annual dental exams for proper oral hygiene  Community Resource Referral / Chronic Care Management: CRR required this visit?  {YES/NO:21197}  CCM required this visit?  {YES/NO:21197}     Plan:     I have personally reviewed and noted the following in the patient's chart:   Medical and social history  Use of alcohol, tobacco or illicit drugs  Current medications and supplements including opioid prescriptions.  Functional ability and status Nutritional status Physical activity Advanced directives List of other physicians Hospitalizations, surgeries, and ER visits in previous 12 months Vitals Screenings to include cognitive, depression, and falls Referrals and appointments  In addition, I have reviewed and discussed with patient certain preventive protocols, quality metrics, and best practice recommendations. A written personalized care plan for preventive services as well as general preventive health recommendations were provided to patient.     Duard Brady Avian [place], [place]   05/09/2022   Nurse Notes: ***

## 2022-05-09 NOTE — Progress Notes (Unsigned)
[facility] at [facility] [address] [place], [place] [address] [phone]  Date:  05/16/2022   Name:  Abigail [name]   DOB:  May 20, 1951   MRN:  [id number]  PCP:  Darreld Mclean, MD    Chief Complaint: No chief complaint on file.   History of Present Illness:  Abigail [name] is a 71 y.o. very pleasant female patient who presents with the following:  Seen today for follow-up A1c  Last seen by myself in January History of diabetes, hyperlipidemia, hypertension, chronic renal insufficiency with nephrology follow-up  Most recent nephrology visit was in [place] note her ARB was stopped due to low blood pressure, continue [place].  Propranolol for palpitations  Abigail [name] is a widow (her husband died in fall of 2020), she is also involved in taking care of her elderly mother who is suffering more from dementia with age  Lab Results  Component Value Date   HGBA1C 7.1 (H) 12/06/2021      Patient Active Problem List   Diagnosis Date Noted   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/29/2020   Essential hypertension    Gastroesophageal reflux disease without esophagitis    Microscopic hematuria 09/16/2015   Trimalleolar fracture of ankle, closed 11/17/2014   Hyperlipidemia associated with type 2 diabetes mellitus ([place]) 07/24/2013   Chest pain 10/23/2012   Type 2 diabetes mellitus without complication, without long-term current use of insulin ([place]) 10/08/2012   Anxiety 10/08/2012    Past Medical History:  Diagnosis Date   Anemia, unspecified    Anxiety    CKD (chronic kidney disease), stage III (HCC)    Depression    Diabetes mellitus type II, non insulin dependent ([place])    On Ozempic and Farxiga   GERD (gastroesophageal reflux disease)    Hematuria    Hyperlipidemia    Hypertension    Mild mitral regurgitation 08/2017   Essentially normal-mild MR on echo.   Normal coronary arteries    Coronary CTA: No evidence of CAD.  Coronary  calcium score 0.   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    Past Surgical History:  Procedure Laterality Date   CHOLECYSTECTOMY N/A 06/03/2015   Procedure: LAPAROSCOPIC CHOLECYSTECTOMY WITH INTRAOPERATIVE CHOLANGIOGRAM;  Surgeon: Donnie Mesa, MD;  Location: [facility];  Service: General;  Laterality: N/A;   COLONOSCOPY     HAMMER TOE SURGERY     ORIF ANKLE FRACTURE Left 11/17/2014   Procedure: OPEN REDUCTION INTERNAL FIXATION (ORIF) LEFT TRIMALLEOLAR ANKLE FRACTURE;  Surgeon: Marianna Payment, MD;  Location: [facility];  Service: Orthopedics;  Laterality: Left;   OVARIAN CYST REMOVAL      Social History   Tobacco Use   Smoking status: Never   Smokeless tobacco: Never  Vaping Use   Vaping Use: Never used  Substance Use Topics   Alcohol use: Yes    Alcohol/week: 1.0 standard drink of alcohol    Types: 1 Glasses of wine per week   Drug use: No    Family History  Problem Relation Age of Onset   Diabetes Mother    Hyperlipidemia Mother    Diabetes Father    Heart disease Father 59       AMI x 2; first AMI age 9   Stroke Father    Hypertension Father    Hyperlipidemia Brother    AAA (abdominal aortic aneurysm) Brother    Congestive Heart Failure Brother  s/p ICD   Coronary artery disease Brother        defibrillator; CHF   Kidney cancer Brother        Status post nephrectomy   Other Sister 2       Died in a car accident    Allergies  Allergen Reactions   Pneumovax 23 [Pneumococcal Vac Polyvalent] Other (See Comments)    Pt had severe arm pain   Amaryl [Glimepiride] Nausea Only   Cephalexin Hives   Sulfa Antibiotics Rash    Medication list has been reviewed and updated.  Current Outpatient Medications on File Prior to Visit  Medication Sig Dispense Refill   Ascorbic Acid (VITAMIN C) 1000 MG tablet Take 1,000 mg by mouth daily.     atorvastatin (LIPITOR) 20 MG tablet TAKE ONE TABLET BY MOUTH ONCE DAILY 90 tablet 3   B Complex-C (SUPER B  COMPLEX PO) Take 1 tablet by mouth daily.     blood glucose meter kit and supplies Dispense based on patient and insurance preference. Use up to four times daily as directed. (FOR ICD-10 E10.9, E11.9). 1 each 0   Blood Glucose Monitoring Suppl (ONE TOUCH ULTRA 2) w/Device KIT by Does not apply route.     Cholecalciferol (VITAMIN D3) 50 MCG (2000 UT) CAPS Take 1 capsule by mouth daily.     FARXIGA 10 MG TABS tablet Take 1 tablet (10 mg total) by mouth daily. For [place] and [place] assistance program 90 tablet 3   FLUoxetine (PROZAC) 20 MG capsule TAKE ONE CAPSULE BY MOUTH DAILY 90 capsule 3   fluticasone (FLONASE) 50 MCG/ACT nasal spray Place 2 sprays into both nostrils daily. 16 g 1   glucose blood test strip 1 each by Other route as needed for other. Use as instructed     losartan (COZAAR) 25 MG tablet TAKE ONE TABLET BY MOUTH ONCE DAILY 90 tablet 3   Multiple Vitamins-Minerals (ALIVE WOMENS 50+) TABS Take 1 tablet by mouth daily.     nitroGLYCERIN (NITROSTAT) 0.4 MG SL tablet PLACE 1 TABLET UNDER THE TONGUE EVERY 5 MINUTES AS NEEDED FOR CHEST PAIN 25 tablet 2   omeprazole (PRILOSEC) 20 MG capsule TAKE ONE CAPSULE BY MOUTH ONCE DAILY 90 capsule 3   OneTouch Delica Lancets 09T MISC Use to check glucose up to 4 times daily. Dx Code E11.9 400 each 1   propranolol (INDERAL) 10 MG tablet TAKE ONE TABLET BY MOUTH every EIGHT TO 12 hours AS NEEDED FOR tremors. Needs appointment for further refills 270 tablet 0   Semaglutide, 2 MG/DOSE, (OZEMPIC, 2 MG/DOSE,) 8 MG/3ML SOPN Inject 2 mg into the skin once a week. 24 mL 3   traZODone (DESYREL) 150 MG tablet TAKE ONE TABLET BY MOUTH AT bedtime 90 tablet 3   zinc gluconate 50 MG tablet Take 50 mg by mouth daily.     No current facility-administered medications on file prior to visit.    Review of Systems:  As per HPI- otherwise negative.   Physical Examination: There were no vitals filed for this visit. There were no vitals filed for this visit. There is no  height or weight on file to calculate BMI. Ideal Body Weight:    GEN: no acute distress. HEENT: Atraumatic, Normocephalic.  Ears and Nose: No external deformity. CV: RRR, No M/G/R. No JVD. No thrill. No extra heart sounds. PULM: CTA B, no wheezes, crackles, rhonchi. No retractions. No resp. distress. No accessory muscle use. ABD: S, NT, ND, +BS. No rebound.  No HSM. EXTR: No c/c/e PSYCH: Normally interactive. Conversant.    Assessment and Plan: ***  Signed Lamar Blinks, MD

## 2022-05-11 ENCOUNTER — Ambulatory Visit (INDEPENDENT_AMBULATORY_CARE_PROVIDER_SITE_OTHER): Payer: PPO

## 2022-05-11 DIAGNOSIS — Z780 Asymptomatic menopausal state: Secondary | ICD-10-CM | POA: Diagnosis not present

## 2022-05-11 DIAGNOSIS — Z0000 Encounter for general adult medical examination without abnormal findings: Secondary | ICD-10-CM

## 2022-05-11 NOTE — Patient Instructions (Signed)
Abigail [name] , Thank you for taking time to come for your Medicare Wellness Visit. I appreciate your ongoing commitment to your health goals. Please review the following plan we discussed and let me know if I can assist you in the future.   Screening recommendations/referrals: Colonoscopy: 06/19/18 due 06/20/23 Mammogram: 08/09/21 due 08/09/22 Bone Density: ordered 05/11/22 Recommended yearly ophthalmology/optometry visit for glaucoma screening and checkup Recommended yearly dental visit for hygiene and checkup  Vaccinations: Influenza vaccine: up to date Pneumococcal vaccine: up to date Tdap vaccine: up to date Shingles vaccine: up to date   Covid-19:declined  Advanced directives: no, packet sent  Conditions/risks identified: see problem list   Next appointment: Follow up in one year for your annual wellness visit    Preventive Care 39 Years and Older, Female Preventive care refers to lifestyle choices and visits with your health care provider that can promote health and wellness. What does preventive care include? A yearly physical exam. This is also called an annual well check. Dental exams once or twice a year. Routine eye exams. Ask your health care provider how often you should have your eyes checked. Personal lifestyle choices, including: Daily care of your teeth and gums. Regular physical activity. Eating a healthy diet. Avoiding tobacco and drug use. Limiting alcohol use. Practicing safe sex. Taking low-dose aspirin every day. Taking vitamin and mineral supplements as recommended by your health care provider. What happens during an annual well check? The services and screenings done by your health care provider during your annual well check will depend on your age, overall health, lifestyle risk factors, and family history of disease. Counseling  Your health care provider may ask you questions about your: Alcohol use. Tobacco use. Drug use. Emotional  well-being. Home and relationship well-being. Sexual activity. Eating habits. History of falls. Memory and ability to understand (cognition). Work and work Statistician. Reproductive health. Screening  You may have the following tests or measurements: Height, weight, and BMI. Blood pressure. Lipid and cholesterol levels. These may be checked every 5 years, or more frequently if you are over 62 years old. Skin check. Lung cancer screening. You may have this screening every year starting at age 38 if you have a 30-pack-year history of smoking and currently smoke or have quit within the past 15 years. Fecal occult blood test (FOBT) of the stool. You may have this test every year starting at age 24. Flexible sigmoidoscopy or colonoscopy. You may have a sigmoidoscopy every 5 years or a colonoscopy every 10 years starting at age 76. Hepatitis C blood test. Hepatitis B blood test. Sexually transmitted disease (STD) testing. Diabetes screening. This is done by checking your blood sugar (glucose) after you have not eaten for a while (fasting). You may have this done every 1-3 years. Bone density scan. This is done to screen for osteoporosis. You may have this done starting at age 43. Mammogram. This may be done every 1-2 years. Talk to your health care provider about how often you should have regular mammograms. Talk with your health care provider about your test results, treatment options, and if necessary, the need for more tests. Vaccines  Your health care provider may recommend certain vaccines, such as: Influenza vaccine. This is recommended every year. Tetanus, diphtheria, and acellular pertussis (Tdap, Td) vaccine. You may need a Td booster every 10 years. Zoster vaccine. You may need this after age 28. Pneumococcal 13-valent conjugate (PCV13) vaccine. One dose is recommended after age 65. Pneumococcal polysaccharide (PPSV23) vaccine. One  dose is recommended after age 47. Talk to your  health care provider about which screenings and vaccines you need and how often you need them. This information is not intended to replace advice given to you by your health care provider. Make sure you discuss any questions you have with your health care provider. Document Released: 11/20/2015 Document Revised: 07/13/2016 Document Reviewed: 08/25/2015 Elsevier Interactive Patient Education  2017 [place] Prevention in the Home Falls can cause injuries. They can happen to people of all ages. There are many things you can do to make your home safe and to help prevent falls. What can I do on the outside of my home? Regularly fix the edges of walkways and driveways and fix any cracks. Remove anything that might make you trip as you walk through a door, such as a raised step or threshold. Trim any bushes or trees on the path to your home. Use bright outdoor lighting. Clear any walking paths of anything that might make someone trip, such as rocks or tools. Regularly check to see if handrails are loose or broken. Make sure that both sides of any steps have handrails. Any raised decks and porches should have guardrails on the edges. Have any leaves, snow, or ice cleared regularly. Use sand or salt on walking paths during winter. Clean up any spills in your garage right away. This includes oil or grease spills. What can I do in the bathroom? Use night lights. Install grab bars by the toilet and in the tub and shower. Do not use towel bars as grab bars. Use non-skid mats or decals in the tub or shower. If you need to sit down in the shower, use a plastic, non-slip stool. Keep the floor dry. Clean up any water that spills on the floor as soon as it happens. Remove soap buildup in the tub or shower regularly. Attach bath mats securely with double-sided non-slip rug tape. Do not have throw rugs and other things on the floor that can make you trip. What can I do in the bedroom? Use night  lights. Make sure that you have a light by your bed that is easy to reach. Do not use any sheets or blankets that are too big for your bed. They should not hang down onto the floor. Have a firm chair that has side arms. You can use this for support while you get dressed. Do not have throw rugs and other things on the floor that can make you trip. What can I do in the kitchen? Clean up any spills right away. Avoid walking on wet floors. Keep items that you use a lot in easy-to-reach places. If you need to reach something above you, use a strong step stool that has a grab bar. Keep electrical cords out of the way. Do not use floor polish or wax that makes floors slippery. If you must use wax, use non-skid floor wax. Do not have throw rugs and other things on the floor that can make you trip. What can I do with my stairs? Do not leave any items on the stairs. Make sure that there are handrails on both sides of the stairs and use them. Fix handrails that are broken or loose. Make sure that handrails are as long as the stairways. Check any carpeting to make sure that it is firmly attached to the stairs. Fix any carpet that is loose or worn. Avoid having throw rugs at the top or bottom of the  stairs. If you do have throw rugs, attach them to the floor with carpet tape. Make sure that you have a light switch at the top of the stairs and the bottom of the stairs. If you do not have them, ask someone to add them for you. What else can I do to help prevent falls? Wear shoes that: Do not have high heels. Have rubber bottoms. Are comfortable and fit you well. Are closed at the toe. Do not wear sandals. If you use a stepladder: Make sure that it is fully opened. Do not climb a closed stepladder. Make sure that both sides of the stepladder are locked into place. Ask someone to hold it for you, if possible. Clearly mark and make sure that you can see: Any grab bars or handrails. First and last  steps. Where the edge of each step is. Use tools that help you move around (mobility aids) if they are needed. These include: Canes. Walkers. Scooters. Crutches. Turn on the lights when you go into a dark area. Replace any light bulbs as soon as they burn out. Set up your furniture so you have a clear path. Avoid moving your furniture around. If any of your floors are uneven, fix them. If there are any pets around you, be aware of where they are. Review your medicines with your doctor. Some medicines can make you feel dizzy. This can increase your chance of falling. Ask your doctor what other things that you can do to help prevent falls. This information is not intended to replace advice given to you by your health care provider. Make sure you discuss any questions you have with your health care provider. Document Released: 08/20/2009 Document Revised: 03/31/2016 Document Reviewed: 11/28/2014 Elsevier Interactive Patient Education  2017 [employer].

## 2022-05-13 NOTE — Patient Instructions (Incomplete)
It was good to see you today, as always!  Assuming all is well, please see me in about 6 months I will be in touch with your labs   As we discussed, taking fluoxetine and trazodone may increase your risk of serotonin syndrome, a rare condition but still important to watch for! Signs and symptoms include:  Agitation or restlessness Insomnia Confusion Rapid heart rate and high blood pressure Dilated pupils Loss of muscle coordination or twitching muscles High blood pressure Muscle rigidity Heavy sweating Diarrhea Headache Shivering Goose bumps

## 2022-05-16 ENCOUNTER — Ambulatory Visit (INDEPENDENT_AMBULATORY_CARE_PROVIDER_SITE_OTHER): Payer: PPO | Admitting: Family Medicine

## 2022-05-16 VITALS — BP 110/64 | HR 81 | Temp 97.6°F | Resp 18 | Ht 65.0 in | Wt 142.2 lb

## 2022-05-16 DIAGNOSIS — E2839 Other primary ovarian failure: Secondary | ICD-10-CM

## 2022-05-16 DIAGNOSIS — Z636 Dependent relative needing care at home: Secondary | ICD-10-CM

## 2022-05-16 DIAGNOSIS — E119 Type 2 diabetes mellitus without complications: Secondary | ICD-10-CM

## 2022-05-16 DIAGNOSIS — E1169 Type 2 diabetes mellitus with other specified complication: Secondary | ICD-10-CM | POA: Diagnosis not present

## 2022-05-16 DIAGNOSIS — F4323 Adjustment disorder with mixed anxiety and depressed mood: Secondary | ICD-10-CM | POA: Diagnosis not present

## 2022-05-16 DIAGNOSIS — E785 Hyperlipidemia, unspecified: Secondary | ICD-10-CM

## 2022-05-16 DIAGNOSIS — I10 Essential (primary) hypertension: Secondary | ICD-10-CM

## 2022-05-16 DIAGNOSIS — N1832 Chronic kidney disease, stage 3b: Secondary | ICD-10-CM

## 2022-05-16 MED ORDER — FLUOXETINE HCL 40 MG PO CAPS: 40.0000 mg | ORAL_CAPSULE | Freq: Every day | ORAL | 3 refills | Status: DC

## 2022-05-17 ENCOUNTER — Encounter: Payer: Self-pay | Admitting: Family Medicine

## 2022-05-17 LAB — BASIC METABOLIC PANEL
BUN: 21 mg/dL (ref 6–23)
CO2: 29 mEq/L (ref 19–32)
Calcium: 9.2 mg/dL (ref 8.4–10.5)
Chloride: 99 mEq/L (ref 96–112)
Creatinine, Ser: 1.58 mg/dL — ABNORMAL HIGH (ref 0.40–1.20)
GFR: 32.46 mL/min — ABNORMAL LOW (ref >60.00)
Glucose, Bld: 109 mg/dL — ABNORMAL HIGH (ref 70–99)
Potassium: 4.5 mEq/L (ref 3.5–5.1)
Sodium: 136 mEq/L (ref 135–145)

## 2022-05-17 LAB — HEMOGLOBIN A1C: Hgb A1c MFr Bld: 7.4 % — ABNORMAL HIGH (ref 4.6–6.5)

## 2022-05-25 MED ORDER — GLUCOSE BLOOD VI STRP: 1.0000 | ORAL_STRIP | 3 refills | Status: DC | PRN

## 2022-05-26 ENCOUNTER — Encounter: Payer: Self-pay | Admitting: Family Medicine

## 2022-05-26 ENCOUNTER — Ambulatory Visit (HOSPITAL_BASED_OUTPATIENT_CLINIC_OR_DEPARTMENT_OTHER)
Admission: RE | Admit: 2022-05-26 | Discharge: 2022-05-26 | Disposition: A | Discharge status: Home / Self Care | Payer: PPO | Source: Ambulatory Visit | Attending: Family Medicine | Admitting: Family Medicine

## 2022-05-26 DIAGNOSIS — E2839 Other primary ovarian failure: Secondary | ICD-10-CM | POA: Diagnosis not present

## 2022-05-26 DIAGNOSIS — Z780 Asymptomatic menopausal state: Secondary | ICD-10-CM | POA: Diagnosis not present

## 2022-05-26 DIAGNOSIS — M85852 Other specified disorders of bone density and structure, left thigh: Secondary | ICD-10-CM | POA: Diagnosis not present

## 2022-06-08 ENCOUNTER — Ambulatory Visit (INDEPENDENT_AMBULATORY_CARE_PROVIDER_SITE_OTHER): Payer: PPO | Admitting: Pharmacist

## 2022-06-08 DIAGNOSIS — E119 Type 2 diabetes mellitus without complications: Secondary | ICD-10-CM

## 2022-06-08 DIAGNOSIS — M859 Disorder of bone density and structure, unspecified: Secondary | ICD-10-CM

## 2022-06-09 NOTE — Patient Instructions (Addendum)
Abigail [name],  It was a pleasure  to speak with you today.  Below is a summary of your health goals and our recent visit. You can also view your updated Chronic Care Management Care plan through your MyChart account.   Patient Goals/Self-Care Activities Over the next 180 days, patient will:  take medications as prescribed  Continue to check blood glucose 1 to 2 times per day and record for future visits.  collaborate with provider on medication access solutions Recommended she increase calcium intake to 1231m daily thru diet and supplementation if needed Continue to take vitamin D 2000 units daily.  Continue to walk 30 to 40 minutes daily.    As always if you have any questions or concerns especially regarding medications, please feel free to contact me either at the phone number below or with a MyChart message.   Keep up the good work!  TCherre Robins PharmD Clinical Pharmacist [facility] [phone](direct line)  [phone](main office number)   Patient verbalizes understanding of instructions and care plan provided today and agrees to view in [facility] MyChart status and patient understanding of how to access instructions and care plan via MyChart confirmed with patient.     Steps to Estimate Your Calcium Intake  The chart below will help you estimate the amount of calcium you get from food on a typical day. You'll also see how much more calcium you need to get each day from other food sources or supplements.  Calcium Calculator How to Estimate Your Daily Calcium Intake Step 1: Estimate the number of servings you have on a typical day for each type of food. One serving is equal to approximately:  8 oz. or one cup of milk 6 oz. of yogurt 1 oz. or 1 cubic inch of cheese The amount of calcium in fortified foods and juices ranges from 80 - 1,000 mg. Some examples are juices, soymilk and cereals.  Step 2: List the estimated number  of servings of each food item under "Servings Per Day."  Step 3: Multiply the number of "Servings Per Day" by the number of milligrams (mg) under "Calcium." For example: if you have about two servings of milk per day, multiply 2 x 300 to get a total of 600 mg of calcium from milk.  Step 4: After you have calculated the total amount of calcium for each product, add these totals in the right hand column to get your Total Daily Calcium Intake. Note: 250 mg of calcium is automatically added under "Estimated total from other foods." Most of [place] about this amount of calcium each day from other foods like broccoli.  Step 5: Subtract your final total daily calcium intake from the recommended amount of calcium you need each day. This number is the additional calcium you need each day. You can get this additional calcium by adding calcium-rich foods to your diet and/or by taking a calcium supplement.                Exercise for Strong Bones  Exercise is important to build and maintain strong bones / bone density.  There are 2 types of exercises that are important to building and maintaining strong bones:  Weight- bearing and muscle-stregthening.  Weight-bearing Exercises  These exercises include activities that make you move against gravity while staying upright. Weight-bearing exercises can be high-impact or low-impact.  High-impact weight-bearing exercises help build bones and keep them strong. If you have broken  a bone due to osteoporosis or are at risk of breaking a bone, you may need to avoid high-impact exercises. If you're not sure, you should check with your healthcare provider.  Examples of high-impact weight-bearing exercises are: Dancing  Doing high-impact aerobics  Hiking  Jogging/running  Jumping Rope  Stair climbing  Tennis  Low-impact weight-bearing exercises can also help keep bones strong and are a safe alternative if you cannot do high-impact exercises.   Examples of  low-impact weight-bearing exercises are: Using elliptical training machines  Doing low-impact aerobics  Using stair-step machines  Fast walking on a treadmill or outside   Muscle-Strengthening Exercises These exercises include activities where you move your body, a weight or some other resistance against gravity. They are also known as resistance exercises and include: Lifting weights  Using elastic exercise bands  Using weight machines  Lifting your own body weight  Functional movements, such as standing and rising up on your toes  Yoga and Pilates can also improve strength, balance and flexibility. However, certain positions may not be safe for people with osteoporosis or those at increased risk of broken bones. For example, exercises that have you bend forward may increase the chance of breaking a bone in the spine.   Non-Impact Exercises There are other types of exercises that can help prevent falls.  Non-impact exercises can help you to improve balance, posture and how well you move in everyday activities. Some of these exercises include: Balance exercises that strengthen your legs and test your balance, such as Tai Chi, can decrease your risk of falls.  Posture exercises that improve your posture and reduce rounded or "sloping" shoulders can help you decrease the chance of breaking a bone, especially in the spine.  Functional exercises that improve how well you move can help you with everyday activities and decrease your chance of falling and breaking a bone. For example, if you have trouble getting up from a chair or climbing stairs, you should do these activities as exercises.   **A physical therapist can teach you balance, posture and functional exercises. He/she can also help you learn which exercises are safe and appropriate for you.  Abigail [name] has a physical therapy office in [place] in front of our office and referrals can be made for assessments and treatment as needed and  strength and balance training.  If you would like to have an assessment with Abigail [name] and our physical therapy team please let a nurse or provider know.

## 2022-06-09 NOTE — Chronic Care Management (AMB) (Signed)
Chronic Care Management Pharmacy Note  06/08/2022 Name:  Abigail [name] MRN:  [id number] DOB:  10-Mar-1951  Summary:  Coordinated with patient assistance program today. Submitted patient assistance program application in July 7948 for Ozempic. [employer] today to check application. Patient assistance program has been approved now thru 10/06/2022. Ozempic 64m / mL pen is in process of being filled but has not shipped yet.  Verified with patient she has at least 1 month of Ozempic on hand.  Patient had questions about recent DEXA results.  Discussed DEXA results and answered patients questions about results. She has low BMD but that risk did not indicate need for pharmacotherapy at this time Recommended she increase calcium intake to 12056mdaily thru diet and supplementation if needed. Continue to take vitamin D 2000 units daily. Continue to walk 30 to 40 minutes daily. Discussed fall prevention   Health Maintenance:  Reviewed vaccination history and updated records to reflect Shingrix doses   Subjective: Abigail [name] an [age] year old female who is a primary patient of Abigail [name].  The CCM team was consulted for assistance with disease management and care coordination needs.    Collaboration with patient and Novo Nordisk PAP  for  Ozempic assistance and for Chronic Care Management follow up  in response to provider referral for pharmacy case management and/or care coordination services. 71   Consent to Services:  The patient was given information about Chronic Care Management services, agreed to services, and gave verbal consent prior to initiation of services.  Please see initial visit note for detailed documentation.   Patient Care Team: Abigail [name] as PCP - General (Family Medicine) HaLeonie ManMD as PCP - Cardiology (Cardiology) EcCherre RobinsRPEast NassauPharmacist) ScMacarthur CritchleyODPicketts Referring Physician (Optometry)  Recent office  visits: 05/16/2022 - Fam Med (Dr CoLorelei PontFollow up visit. Refilled fluoxetine 4097maily. Ordered DEXA. F/U 6 months. 12/06/2021 - PCP (Dr CopLorelei Pontollow up DM and right flank pain. No medication changes. Labs checked.  07/08/2021 - PCP (Dr CopLorelei Ponto changes in medications  Recent consult visits: 07/2021 - Nephrologist (Dr GolMoshe Cipro Hospital visits: None in previous 6 months  Objective:  Lab Results  Component Value Date   CREATININE 1.58 (H) 05/16/2022   CREATININE 1.53 (H) 12/06/2021   CREATININE 1.5 (A) 07/28/2021    Lab Results  Component Value Date   HGBA1C 7.4 (H) 05/16/2022   Last diabetic Eye exam:  Lab Results  Component Value Date/Time   HMDIABEYEEXA No Retinopathy 03/03/2022 12:00 AM    Last diabetic Foot exam: No results found for: "HMDIABFOOTEX"      Component Value Date/Time   CHOL 148 12/06/2021 1347   TRIG 168.0 (H) 12/06/2021 1347   HDL 48.40 12/06/2021 1347   CHOLHDL 3 12/06/2021 1347   VLDL 33.6 12/06/2021 1347   LDLCALC 66 12/06/2021 1347       Latest Ref Rng & Units 12/06/2021    1:47 PM 03/31/2021   12:00 AM 12/21/2020   12:00 AM  Hepatic Function  Total Protein 6.0 - 8.3 g/dL 6.6     Albumin 3.5 - 5.2 g/dL 4.2  4.4     4.3      AST 0 - 37 U/L 14     ALT 0 - 35 U/L 12     Alk Phosphatase 39 - 117 U/L 90     Total Bilirubin 0.2 - 1.2 mg/dL 0.4  This result is from an external source.    Lab Results  Component Value Date/Time   TSH 1.54 12/06/2021 01:47 PM   TSH 2.16 02/24/2020 03:52 PM   FREET4 0.91 03/17/2016 11:07 AM       Latest Ref Rng & Units 12/06/2021    1:47 PM 07/28/2021   12:00 AM 03/31/2021   12:00 AM  CBC  WBC 4.0 - 10.5 K/uL 7.9     Hemoglobin 12.0 - 15.0 g/dL 12.7  12.8     12.9      Hematocrit 36.0 - 46.0 % 38.6     Platelets 150.0 - 400.0 K/uL 254.0        This result is from an external source.    Lab Results  Component Value Date/Time   VD25OH 47.16 12/06/2021 01:47 PM   VD25OH 37.36  02/24/2020 03:52 PM    Clinical ASCVD: No  The 10-year ASCVD risk score (Arnett DK, et al., 2019) is: 20.4%   Values used to calculate the score:     Age: 71 years     Sex: Female     Is Non-Hispanic African American: No     Diabetic: Yes     Tobacco smoker: No     Systolic Blood Pressure: 220 mmHg     Is BP treated: Yes     HDL Cholesterol: 48.4 mg/dL     Total Cholesterol: 148 mg/dL     Social History   Tobacco Use  Smoking Status Never  Smokeless Tobacco Never   BP Readings from Last 3 Encounters:  05/16/22 110/64  12/06/21 100/62  05/03/21 118/70   Pulse Readings from Last 3 Encounters:  05/16/22 81  12/06/21 96  05/03/21 86   Wt Readings from Last 3 Encounters:  05/16/22 142 lb 3.2 oz (64.5 kg)  12/06/21 143 lb (64.9 kg)  05/05/21 143 lb (64.9 kg)    Assessment: Review of patient past medical history, allergies, medications, health status, including review of consultants reports, laboratory and other test data, was performed as part of comprehensive evaluation and provision of chronic care management services.   SDOH:  (Social Determinants of Health) assessments and interventions performed:     CCM Care Plan  Allergies  Allergen Reactions   Pneumovax 23 [Pneumococcal Vac Polyvalent] Other (See Comments)    Pt had severe arm pain   Amaryl [Glimepiride] Nausea Only   Cephalexin Hives   Sulfa Antibiotics Rash    Medications Reviewed Today     Reviewed by Cherre Robins, RPH-CPP (Pharmacist) on 03/08/22 at [place] List Status: <None>   Medication Order Taking? Sig Documenting Provider Last Dose Status Informant  Ascorbic Acid (VITAMIN C) 1000 MG tablet [id number] Yes Take 1,000 mg by mouth daily. [provider] Taking Active   atorvastatin (LIPITOR) 20 MG tablet [id number] Yes TAKE ONE TABLET BY MOUTH ONCE DAILY Copland, Gay Filler, MD Taking Active   B Complex-C (SUPER B COMPLEX PO) [id number] Yes Take 1 tablet by mouth daily. [provider] Taking Active   blood glucose meter kit and supplies [id number] Yes Dispense based on patient and insurance preference. Use up to four times daily as directed. (FOR ICD-10 E10.9, E11.9). Copland, Gay Filler, MD Taking Active   Blood Glucose Monitoring Suppl (ONE TOUCH ULTRA 2) w/Device KIT [id number] Yes by Does not apply route. [provider] Taking Active   Cholecalciferol (VITAMIN D3) 50 MCG (2000 UT) CAPS [id number] Yes Take 1 capsule by mouth daily. [provider] Taking Active   FARXIGA 10 MG TABS tablet [id number] Yes Take 1 tablet (10 mg total) by mouth daily. For [place] and Me assistance program Copland, Gay Filler, MD Taking Active   FLUoxetine (PROZAC) 20 MG capsule [id number] Yes TAKE ONE CAPSULE BY MOUTH DAILY Copland, Gay Filler, MD Taking Active   fluticasone (FLONASE) 50 MCG/ACT nasal spray [id number] Yes Place 2 sprays into both nostrils daily. Saguier, Percell Miller, PA-C Taking Active   glucose blood test strip [id number] Yes 1 each by Other route as needed for other. Use as instructed [provider] Taking Active   losartan (COZAAR) 25 MG tablet [id number] Yes Take 1 tablet (25 mg total) by mouth daily. Copland, Gay Filler, MD Taking Active   Multiple Vitamins-Minerals (ALIVE WOMENS 50+) Sheral Flow [id number] Yes Take 1 tablet by mouth daily. [provider] Taking Active   nitroGLYCERIN (NITROSTAT) 0.4 MG SL tablet [id number] Yes PLACE 1 TABLET UNDER THE TONGUE EVERY 5 MINUTES AS NEEDED FOR CHEST PAIN Copland, Gay Filler, MD Taking Active            Med Note [place]   Fri Jun 12, 2020  4:22 PM) Has not had to use, but has it just in case  omeprazole (PRILOSEC) 20 MG capsule [id number] Yes TAKE ONE CAPSULE BY MOUTH ONCE DAILY Copland, Gay Filler, MD Taking Active   OneTouch Delica Lancets 37J MISC [id number] Yes Use to check glucose up to 4 times daily. Dx Code E11.9 Copland, Gay Filler, MD Taking Active   propranolol (INDERAL) 10 MG tablet [id number] Yes TAKE  ONE TABLET BY MOUTH EVERY EIGHT TO TWELVE HOURS AS NEEDED FOR tremors Copland, Gay Filler, MD Taking Active   Semaglutide, 2 MG/DOSE, (OZEMPIC, 2 MG/DOSE,) 8 MG/3ML SOPN [id number] Yes Inject 2 mg into the skin once a week. Copland, Gay Filler, MD Taking Active            Med Note Barbaraann Boys Dec 20, 2021  8:48 AM) Approved thur [employer] patient assistance program thru 04/02/2022  traZODone (DESYREL) 150 MG tablet [id number] Yes TAKE ONE TABLET BY MOUTH AT bedtime Copland, Gay Filler, MD Taking Active   zinc gluconate 50 MG tablet [id number] Yes Take 50 mg by mouth daily. [provider] Taking Active             Patient Active Problem List   Diagnosis Date Noted   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/29/2020   Essential hypertension    Gastroesophageal reflux disease without esophagitis    Microscopic hematuria 09/16/2015   Trimalleolar fracture of ankle, closed 11/17/2014   Hyperlipidemia associated with type 2 diabetes mellitus ([place]) 07/24/2013   Chest pain 10/23/2012   Type 2 diabetes mellitus without complication, without long-term current use of insulin ([place]) 10/08/2012   Anxiety 10/08/2012    Immunization History  Administered Date(s) Administered   Fluad Quad(high Dose 65+) 07/01/2019   Influenza Whole 08/08/2013   Influenza, High Dose Seasonal PF 08/03/2016, 08/17/2017   Influenza, Seasonal, Injecte, Preservative Fre 10/08/2012   Influenza,inj,Quad PF,6+ Mos 08/05/2015   Influenza-Unspecified 08/08/2014, 08/13/2020, 07/31/2021   PFIZER(Purple Top)SARS-COV-2 Vaccination 01/13/2020, 02/12/2020, 08/13/2020   Pneumococcal Conjugate-13 11/03/2014   Pneumococcal Polysaccharide-23 02/10/2016   Td 12/06/2021   Zoster Recombinat (Shingrix) 09/13/2018, 10/05/2020    Conditions to be addressed/monitored: HTN, HLD, DMII, and GERD  There are no care plans that you recently modified to display for this patient.    Medication Assistance:  Farxiga obtained through [facility] and Me  medication assistance program.  Enrollment ends 32/54/9826 Application for 4158 patient assistance program for NovoNordisk / Ozempic has been faxed to Novo patient assistance program. Expecting shipment in 7 to 10 days.   Patient's preferred pharmacy is:  [facility] [place], [place] - [address] [place] [address] Phone: [phone] Fax: [phone]  [facility] - [place], [place] - [address] [place] [address] Phone: [phone] Fax: [phone]  [facility] [place] [address] Phone: [phone] Fax: [phone]  [facility] [place], [place] - [address] [place] [facility] [address] Phone: [phone] Fax: [place] [address], [place] [address] Phone: [phone] Fax: [place], [place] [address] [place] [address] Phone: [phone] Fax: [phone]    Follow Up:  Patient agrees to Care Plan and Follow-up.  Plan: Telephone follow up appointment with care management team member scheduled for:  3 months  Cherre Robins, PharmD Clinical Pharmacist [facility]

## 2022-06-27 ENCOUNTER — Telehealth: Payer: Self-pay

## 2022-06-27 NOTE — Telephone Encounter (Signed)
Called and left VM to let the pt know that her pt assistance- Ozempic is here in the office.

## 2022-06-30 ENCOUNTER — Other Ambulatory Visit: Payer: Self-pay | Admitting: Family Medicine

## 2022-06-30 DIAGNOSIS — G250 Essential tremor: Secondary | ICD-10-CM

## 2022-06-30 DIAGNOSIS — F329 Major depressive disorder, single episode, unspecified: Secondary | ICD-10-CM

## 2022-06-30 DIAGNOSIS — E785 Hyperlipidemia, unspecified: Secondary | ICD-10-CM

## 2022-06-30 DIAGNOSIS — E119 Type 2 diabetes mellitus without complications: Secondary | ICD-10-CM

## 2022-07-01 ENCOUNTER — Telehealth: Payer: Self-pay

## 2022-07-01 NOTE — Telephone Encounter (Signed)
PA initiated via Covermymeds; KEY: [id number]. Awaiting determination.

## 2022-07-04 NOTE — Telephone Encounter (Signed)
PA approved.   25-AUG-23:31-DEC-23 Ozempic (2 MG/DOSE) '8MG'$ /3ML [place] SOPN Quantity:3;

## 2022-07-07 DIAGNOSIS — E119 Type 2 diabetes mellitus without complications: Secondary | ICD-10-CM

## 2022-07-27 ENCOUNTER — Encounter: Payer: Self-pay | Admitting: Family Medicine

## 2022-07-27 ENCOUNTER — Ambulatory Visit (INDEPENDENT_AMBULATORY_CARE_PROVIDER_SITE_OTHER): Payer: PPO | Admitting: Family Medicine

## 2022-07-27 VITALS — BP 118/78 | HR 101 | Temp 99.0°F | Resp 18 | Ht 65.0 in | Wt 141.6 lb

## 2022-07-27 DIAGNOSIS — J069 Acute upper respiratory infection, unspecified: Secondary | ICD-10-CM | POA: Diagnosis not present

## 2022-07-27 LAB — POC INFLUENZA A&B (BINAX/QUICKVUE)
Influenza A, POC: NEGATIVE
Influenza B, POC: NEGATIVE

## 2022-07-27 LAB — POC COVID19 BINAXNOW: SARS Coronavirus 2 Ag: NEGATIVE

## 2022-07-27 MED ORDER — PROMETHAZINE-DM 6.25-15 MG/5ML PO SYRP: 5.0000 mL | ORAL_SOLUTION | Freq: Four times a day (QID) | ORAL | 0 refills | Status: DC | PRN

## 2022-07-27 MED ORDER — PREDNISONE 20 MG PO TABS: 40.0000 mg | ORAL_TABLET | Freq: Every day | ORAL | 0 refills | Status: AC

## 2022-07-27 NOTE — Progress Notes (Signed)
Chief Complaint  Patient presents with   Sore Throat    Pt states sxs started Monday. Pt states having nasal congestion, sore throat, cough, headache. Pt reports chest pain with cough. No COVID test.     Abigail [name] here for URI complaints.  Duration: 2 days  Associated symptoms: sinus congestion, rhinorrhea, sore throat, myalgia, and coughing Denies: sinus pain, itchy watery eyes, ear pain, ear drainage, wheezing, shortness of breath, N/V/D and fevers Treatment to date: Coricidin, Zyrtec, INCS Sick contacts: No Has not tested for covid.   Past Medical History:  Diagnosis Date   Anemia, unspecified    Anxiety    CKD (chronic kidney disease), stage III ([place])    Depression    Diabetes mellitus type II, non insulin dependent ([place])    On Ozempic and Farxiga   GERD (gastroesophageal reflux disease)    Hematuria    Hyperlipidemia    Hypertension    Mild mitral regurgitation 08/2017   Essentially normal-mild MR on echo.   Normal coronary arteries    Coronary CTA: No evidence of CAD.  Coronary calcium score 0.   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, [facility]    Objective BP 118/78 (BP Location: Left Arm, Patient Position: Sitting, Cuff Size: Normal)   Pulse (!) 101   Temp 99 F (37.2 C) (Oral)   Resp 18   Ht '5\' 5"'$  (1.651 m)   Wt 141 lb 9.6 oz (64.2 kg)   SpO2 96%   BMI 23.56 kg/m  General: Awake, alert, appears stated age HEENT: AT, [place], ears patent b/l and TM's neg, nares patent w/o discharge, pharynx pink and without exudates, MMM Neck: No masses or asymmetry Heart: RRR Lungs: CTAB, no accessory muscle use Psych: Age appropriate judgment and insight, normal mood and affect  Viral URI with cough - Plan: predniSONE (DELTASONE) 20 MG tablet, promethazine-dextromethorphan (PROMETHAZINE-DM) 6.25-15 MG/5ML syrup, POC COVID-19, POC Influenza A&B (Binax test)  COVID and flu tests are negative.  Cough syrup as needed, warned about drowsiness.  Prednisone for sinus  congestion/chest wall pain.  She will send me a message if things worsen or fail to improve. Continue to push fluids, practice good hand hygiene, cover mouth when coughing. F/u prn. If starting to experience fevers, shaking, or shortness of breath, seek immediate care. Pt voiced understanding and agreement to the plan.  [place], DO 07/27/22 4:29 PM

## 2022-07-27 NOTE — Patient Instructions (Signed)
Continue to push fluids, practice good hand hygiene, and cover your mouth if you cough. ? ?If you start having fevers, shaking or shortness of breath, seek immediate care. ? ?OK to take Tylenol 1000 mg (2 extra strength tabs) or 975 mg (3 regular strength tabs) every 6 hours as needed. ? ?Let us know if you need anything. ?

## 2022-08-01 ENCOUNTER — Encounter: Payer: Self-pay | Admitting: Family Medicine

## 2022-08-01 DIAGNOSIS — U071 COVID-19: Secondary | ICD-10-CM

## 2022-08-01 MED ORDER — NIRMATRELVIR/RITONAVIR (PAXLOVID) TABLET (RENAL DOSING): 2.0000 | ORAL_TABLET | Freq: Two times a day (BID) | ORAL | 0 refills | Status: AC

## 2022-08-01 NOTE — Telephone Encounter (Signed)
Called patient, she is positive for COVID.  Called Paxlovid at [facility]. I asked her to let me know if not improving, seek emergency care if not okay.  She states understanding.  Went over Radiographer, therapeutic

## 2022-08-03 ENCOUNTER — Other Ambulatory Visit: Payer: Self-pay

## 2022-08-03 ENCOUNTER — Emergency Department (HOSPITAL_BASED_OUTPATIENT_CLINIC_OR_DEPARTMENT_OTHER): Payer: PPO

## 2022-08-03 ENCOUNTER — Encounter (HOSPITAL_BASED_OUTPATIENT_CLINIC_OR_DEPARTMENT_OTHER): Payer: Self-pay

## 2022-08-03 ENCOUNTER — Emergency Department (HOSPITAL_BASED_OUTPATIENT_CLINIC_OR_DEPARTMENT_OTHER): Payer: PPO | Admitting: Radiology

## 2022-08-03 ENCOUNTER — Emergency Department (HOSPITAL_BASED_OUTPATIENT_CLINIC_OR_DEPARTMENT_OTHER)
Admission: EM | Admit: 2022-08-03 | Discharge: 2022-08-03 | Disposition: A | Discharge status: Home / Self Care | Payer: PPO | Attending: Emergency Medicine | Admitting: Emergency Medicine

## 2022-08-03 DIAGNOSIS — E119 Type 2 diabetes mellitus without complications: Secondary | ICD-10-CM | POA: Diagnosis not present

## 2022-08-03 DIAGNOSIS — U071 COVID-19: Secondary | ICD-10-CM | POA: Diagnosis not present

## 2022-08-03 DIAGNOSIS — R42 Dizziness and giddiness: Secondary | ICD-10-CM | POA: Insufficient documentation

## 2022-08-03 DIAGNOSIS — Z7984 Long term (current) use of oral hypoglycemic drugs: Secondary | ICD-10-CM | POA: Insufficient documentation

## 2022-08-03 DIAGNOSIS — R079 Chest pain, unspecified: Secondary | ICD-10-CM | POA: Diagnosis not present

## 2022-08-03 DIAGNOSIS — R519 Headache, unspecified: Secondary | ICD-10-CM | POA: Diagnosis not present

## 2022-08-03 DIAGNOSIS — Z794 Long term (current) use of insulin: Secondary | ICD-10-CM | POA: Insufficient documentation

## 2022-08-03 DIAGNOSIS — R0602 Shortness of breath: Secondary | ICD-10-CM

## 2022-08-03 DIAGNOSIS — Z79899 Other long term (current) drug therapy: Secondary | ICD-10-CM | POA: Insufficient documentation

## 2022-08-03 DIAGNOSIS — I10 Essential (primary) hypertension: Secondary | ICD-10-CM | POA: Insufficient documentation

## 2022-08-03 LAB — BASIC METABOLIC PANEL
Anion gap: 10 (ref 5–15)
BUN: 25 mg/dL — ABNORMAL HIGH (ref 8–23)
CO2: 27 mmol/L (ref 22–32)
Calcium: 9.4 mg/dL (ref 8.9–10.3)
Chloride: 101 mmol/L (ref 98–111)
Creatinine, Ser: 1.3 mg/dL — ABNORMAL HIGH (ref 0.44–1.00)
GFR, Estimated: 44 mL/min — ABNORMAL LOW (ref >60)
Glucose, Bld: 146 mg/dL — ABNORMAL HIGH (ref 70–99)
Potassium: 3.9 mmol/L (ref 3.5–5.1)
Sodium: 138 mmol/L (ref 135–145)

## 2022-08-03 LAB — TROPONIN I (HIGH SENSITIVITY)
Troponin I (High Sensitivity): 8 ng/L (ref <18)
Troponin I (High Sensitivity): 16 ng/L (ref <18)

## 2022-08-03 LAB — CBC WITH DIFFERENTIAL/PLATELET
Abs Immature Granulocytes: 0.23 10*3/uL — ABNORMAL HIGH (ref 0.00–0.07)
Basophils Absolute: 0.1 10*3/uL (ref 0.0–0.1)
Basophils Relative: 1 %
Eosinophils Absolute: 0.2 10*3/uL (ref 0.0–0.5)
Eosinophils Relative: 2 %
HCT: 42.8 % (ref 36.0–46.0)
Hemoglobin: 13.8 g/dL (ref 12.0–15.0)
Immature Granulocytes: 2 %
Lymphocytes Relative: 25 %
Lymphs Abs: 2.5 10*3/uL (ref 0.7–4.0)
MCH: 28.1 pg (ref 26.0–34.0)
MCHC: 32.2 g/dL (ref 30.0–36.0)
MCV: 87.2 fL (ref 80.0–100.0)
Monocytes Absolute: 0.8 10*3/uL (ref 0.1–1.0)
Monocytes Relative: 8 %
Neutro Abs: 6.3 10*3/uL (ref 1.7–7.7)
Neutrophils Relative %: 62 %
Platelets: 235 10*3/uL (ref 150–400)
RBC: 4.91 MIL/uL (ref 3.87–5.11)
RDW: 12.9 % (ref 11.5–15.5)
WBC: 10.2 10*3/uL (ref 4.0–10.5)
nRBC: 0 % (ref 0.0–0.2)

## 2022-08-03 LAB — SARS CORONAVIRUS 2 BY RT PCR: SARS Coronavirus 2 by RT PCR: NEGATIVE

## 2022-08-03 MED ORDER — SODIUM CHLORIDE 0.9 % IV BOLUS
1000.0000 mL | Freq: Once | INTRAVENOUS | Status: AC
  Administered 2022-08-03: 1000 mL via INTRAVENOUS

## 2022-08-03 MED ORDER — IOHEXOL 350 MG/ML SOLN
100.0000 mL | Freq: Once | INTRAVENOUS | Status: AC | PRN
  Administered 2022-08-03: 75 mL via INTRAVENOUS

## 2022-08-03 NOTE — Discharge Instructions (Signed)
Make sure to drink plenty fluids and rest.  Return for new or worsening symptoms otherwise follow-up primary care provider

## 2022-08-03 NOTE — ED Notes (Signed)
Discharge paperwork given and verbally understood. 

## 2022-08-03 NOTE — Telephone Encounter (Signed)
Called pt back- she is feeling weak and ill She will come in to the ER here- she has a friend to drive her, declines EMS

## 2022-08-03 NOTE — ED Provider Notes (Signed)
[facility] Provider Note   CSN: [id number] Arrival date & time: 08/03/22  1016     History  Chief Complaint  Patient presents with   Covid Positive    Chest pain    Abigail [name] is a 71 y.o. female history of type 2 diabetes, hypertension, hyperlipidemia here for evaluation of feeling unwell.  Symptoms started approximately 1 week ago.  Was seen by her PCP and diagnosed with viral URI and started on steroids.  Started feeling worse over the weekend.  She took a home COVID test on Monday which is positive.  She let her PCP know subsequently was prescribed Paxlovid.  Since then she has had continued to have some headaches, lightheadedness and dizziness when she goes from sitting to standing as well as some palpitations.  Denies any overt chest pain however states she gets a "odd sensation" across her chest when she gets to the palpitations.  No pain or swelling to her legs.  No history of PE or DVT.  Pain is nonexertional in nature.  No syncope, numbness.  She feels generally weak however no focal weakness.  No slurred speech, vision changes.  She has been eating and drinking as normal.  No abdominal pain, dysuria or hematuria  HPI     Home Medications Prior to Admission medications   Medication Sig Start Date End Date Taking? Authorizing Provider  Ascorbic Acid (VITAMIN C) 1000 MG tablet Take 1,000 mg by mouth daily.    [provider]  atorvastatin (LIPITOR) 20 MG tablet TAKE ONE TABLET BY MOUTH ONCE DAILY 06/30/22   Copland, Gay Filler, MD  B Complex-C (SUPER B COMPLEX PO) Take 1 tablet by mouth daily.    [provider]  blood glucose meter kit and supplies Dispense based on patient and insurance preference. Use up to four times daily as directed. (FOR ICD-10 E10.9, E11.9). 07/02/19   Copland, Gay Filler, MD  Blood Glucose Monitoring Suppl (ONE TOUCH ULTRA 2) w/Device KIT by Does not apply route.    [provider]  calcium  carbonate (TUMS - DOSED IN MG ELEMENTAL CALCIUM) 500 MG chewable tablet Chew 1 tablet by mouth daily.    [provider]  Cholecalciferol (VITAMIN D3) 50 MCG (2000 UT) CAPS Take 1 capsule by mouth daily.    [provider]  FARXIGA 10 MG TABS tablet Take 1 tablet (10 mg total) by mouth daily. For [place] and [place] assistance program 10/13/21   Copland, Gay Filler, MD  FLUoxetine (PROZAC) 40 MG capsule Take 1 capsule (40 mg total) by mouth daily. 05/16/22   Copland, Gay Filler, MD  fluticasone (FLONASE) 50 MCG/ACT nasal spray Place 2 sprays into both nostrils daily. 03/15/21   Saguier, Percell Miller, PA-C  glucose blood test strip 1 each by Other route as needed for other. Use as instructed 05/25/22   Copland, Gay Filler, MD  Multiple Vitamins-Minerals (ALIVE WOMENS 50+) TABS Take 1 tablet by mouth daily.    [provider]  nirmatrelvir/ritonavir EUA, renal dosing, (PAXLOVID) 10 x 150 MG & 10 x 100MG TABS Take 2 tablets by mouth 2 (two) times daily for 5 days. (Take nirmatrelvir 150 mg one tablet twice daily for 5 days and ritonavir 100 mg one tablet twice daily for 5 days) Patient GFR is 32 08/01/22 08/06/22  Copland, Gay Filler, MD  nitroGLYCERIN (NITROSTAT) 0.4 MG SL tablet PLACE 1 TABLET UNDER THE TONGUE EVERY 5 MINUTES AS NEEDED FOR CHEST PAIN 05/05/20   Copland, Janett Billow  C, MD  omeprazole (PRILOSEC) 20 MG capsule TAKE ONE CAPSULE BY MOUTH ONCE DAILY 03/28/22   Copland, Gay Filler, MD  OneTouch Delica Lancets 82N MISC Use to check glucose up to 4 times daily. Dx Code E11.9 07/16/20   Copland, Gay Filler, MD  promethazine-dextromethorphan (PROMETHAZINE-DM) 6.25-15 MG/5ML syrup Take 5 mLs by mouth 4 (four) times daily as needed for cough. 07/27/22   Shelda Pal, DO  propranolol (INDERAL) 10 MG tablet TAKE ONE TABLET BY MOUTH every eight TO twelve hours AS NEEDED FOR tremors. 06/30/22   Copland, Gay Filler, MD  Semaglutide, 2 MG/DOSE, (OZEMPIC, 2 MG/DOSE,) 8 MG/3ML SOPN Inject 2 mg into the skin  once a week. 06/30/22   Copland, Gay Filler, MD  traZODone (DESYREL) 150 MG tablet Take 1 tablet (150 mg total) by mouth at bedtime. 06/30/22   Copland, Gay Filler, MD  zinc gluconate 50 MG tablet Take 50 mg by mouth daily.    [provider]      Allergies    Pneumovax 23 [pneumococcal vac polyvalent], Amaryl [glimepiride], Cephalexin, and Sulfa antibiotics    Review of Systems   Review of Systems  Constitutional: Negative.   HENT:  Positive for congestion, postnasal drip and rhinorrhea.   Respiratory:  Positive for cough and shortness of breath. Negative for apnea, choking, chest tightness, wheezing and stridor.   Cardiovascular:  Positive for chest pain (odd sensation to chest).  Gastrointestinal: Negative.   Genitourinary: Negative.   Musculoskeletal:  Positive for myalgias.  Skin: Negative.   Neurological:  Positive for dizziness, weakness (generalized), light-headedness and headaches. Negative for tremors, seizures, syncope, facial asymmetry, speech difficulty and numbness.  All other systems reviewed and are negative.   Physical Exam Updated Vital Signs BP (!) 161/73   Pulse 89   Temp 98.3 F (36.8 C) (Oral)   Resp 19   Ht 5' 5" (1.651 m)   Wt 64 kg   SpO2 98%   BMI 23.46 kg/m  Physical Exam Vitals and nursing note reviewed.  Constitutional:      General: She is not in acute distress.    Appearance: She is well-developed. She is not ill-appearing, toxic-appearing or diaphoretic.  HENT:     Head: Normocephalic and atraumatic.     Nose: Congestion and rhinorrhea present.     Mouth/Throat:     Mouth: Mucous membranes are moist.  Eyes:     Pupils: Pupils are equal, round, and reactive to light.  Cardiovascular:     Rate and Rhythm: Tachycardia present.     Pulses: Normal pulses.     Heart sounds: Normal heart sounds.     Comments: Tachycardia low 100's Pulmonary:     Effort: Pulmonary effort is normal. No respiratory distress.     Breath sounds: Normal  breath sounds.     Comments: Clear BIL speaks in full sentences without difficulty Abdominal:     General: Bowel sounds are normal. There is no distension.     Palpations: Abdomen is soft.     Tenderness: There is no abdominal tenderness. There is no right CVA tenderness, left CVA tenderness, guarding or rebound.  Musculoskeletal:        General: No swelling, tenderness, deformity or signs of injury. Normal range of motion.     Cervical back: Normal range of motion.     Right lower leg: No edema.     Left lower leg: No edema.     Comments: No bony tenderness, compartments soft, full  range of motion. Homans neg BIL  Skin:    General: Skin is warm and dry.     Capillary Refill: Capillary refill takes less than 2 seconds.     Comments: No edema, erythema or warmth.  No fluctuance or induration  Neurological:     General: No focal deficit present.     Mental Status: She is alert and oriented to person, place, and time.     Cranial Nerves: No cranial nerve deficit.     Sensory: No sensory deficit.     Motor: No weakness.     Coordination: Coordination normal.     Gait: Gait normal.     Comments: CN 2-12 grossly intact, equal strength, ambulatory, intact sensation, no drift, normal F2N  Psychiatric:        Mood and Affect: Mood normal.     ED Results / Procedures / Treatments   Labs (all labs ordered are listed, but only abnormal results are displayed) Labs Reviewed  CBC WITH DIFFERENTIAL/PLATELET - Abnormal; Notable for the following components:      Result Value   Abs Immature Granulocytes 0.23 (*)    All other components within normal limits  BASIC METABOLIC PANEL - Abnormal; Notable for the following components:   Glucose, Bld 146 (*)    BUN 25 (*)    Creatinine, Ser 1.30 (*)    GFR, Estimated 44 (*)    All other components within normal limits  SARS CORONAVIRUS 2 BY RT PCR  TROPONIN I (HIGH SENSITIVITY)  TROPONIN I (HIGH SENSITIVITY)    EKG EKG  Interpretation  Date/Time:  Wednesday August 03 2022 12:15:45 EDT Ventricular Rate:  120 PR Interval:  130 QRS Duration: 108 QT Interval:  348 QTC Calculation: 491 R Axis:   79 Text Interpretation: Sinus tachycardia Anteroseptal infarct , age undetermined Abnormal ECG When compared with ECG of 18-Dec-2020 10:14, PREVIOUS ECG IS PRESENT when compared to prior, faster rate and new t wave inversions. No STEMI Confirmed by Antony Blackbird [phone]) on 08/03/2022 3:59:57 PM  Radiology CT Angio Chest PE W and/or Wo Contrast  Result Date: 08/03/2022 CLINICAL DATA:  Dizziness. COVID positive. Denies chest pain or shortness of breath. EXAM: CT ANGIOGRAPHY CHEST WITH CONTRAST TECHNIQUE: Multidetector CT imaging of the chest was performed using the standard protocol during bolus administration of intravenous contrast. Multiplanar CT image reconstructions and MIPs were obtained to evaluate the vascular anatomy. RADIATION DOSE REDUCTION: This exam was performed according to the departmental dose-optimization program which includes automated exposure control, adjustment of the mA and/or kV according to patient size and/or use of iterative reconstruction technique. CONTRAST:  58m OMNIPAQUE IOHEXOL 350 MG/ML SOLN COMPARISON:  Chest x-ray from same day. CT chest dated December 18, 2020. FINDINGS: Cardiovascular: Satisfactory opacification of the pulmonary arteries to the segmental level. No evidence of pulmonary embolism. Normal heart size. No pericardial effusion. No thoracic aortic aneurysm or dissection. Mediastinum/Nodes: No enlarged mediastinal, hilar, or axillary lymph nodes. Thyroid gland, trachea, and esophagus demonstrate no significant findings. Lungs/Pleura: Unchanged 4 mm nodule in the posterior right upper lobe (series 7, image 33), benign. No follow-up imaging is recommended. Slightly increased clustered tree-in-bud nodularity in the posterolateral right upper lobe. No focal consolidation, pleural  effusion, or pneumothorax. Upper Abdomen: No acute abnormality. Musculoskeletal: No chest wall abnormality. No acute or significant osseous findings. Review of the MIP images confirms the above findings. IMPRESSION: 1. No evidence of pulmonary embolism. 2. Slightly increased clustered tree-in-bud nodularity in the posterolateral right upper lobe,  consistent with chronic infectious or inflammatory bronchiolitis. Electronically Signed   By: Titus Dubin M.D.   On: 08/03/2022 15:32   CT Head Wo Contrast  Result Date: 08/03/2022 CLINICAL DATA:  Headache.  Dizziness. EXAM: CT HEAD WITHOUT CONTRAST TECHNIQUE: Contiguous axial images were obtained from the base of the skull through the vertex without intravenous contrast. RADIATION DOSE REDUCTION: This exam was performed according to the departmental dose-optimization program which includes automated exposure control, adjustment of the mA and/or kV according to patient size and/or use of iterative reconstruction technique. COMPARISON:  None Available. FINDINGS: Brain: No evidence of acute infarction, hemorrhage, hydrocephalus, extra-axial collection or mass lesion/mass effect. Vascular: No hyperdense vessel or unexpected calcification. Skull: Normal. Negative for fracture or focal lesion. Sinuses/Orbits: Paranasal sinuses and mastoid air cells are clear. Other: None IMPRESSION: No acute intracranial abnormalities.  Normal brain. Electronically Signed   By: Kerby Moors M.D.   On: 08/03/2022 15:30   DG Chest 2 View  Result Date: 08/03/2022 CLINICAL DATA:  Dizziness, chest pain, headaches, COVID positive EXAM: CHEST - 2 VIEW COMPARISON:  12/06/2021 FINDINGS: Cardiac size is within normal limits. Lung fields are clear of any infiltrates or pulmonary edema. There is no pleural effusion or pneumothorax. IMPRESSION: No active cardiopulmonary disease. Electronically Signed   By: Elmer Picker M.D.   On: 08/03/2022 12:56    Procedures Procedures     Medications Ordered in ED Medications  iohexol (OMNIPAQUE) 350 MG/ML injection 100 mL (75 mLs Intravenous Contrast Given 08/03/22 1513)  sodium chloride 0.9 % bolus 1,000 mL (0 mLs Intravenous Stopped 08/03/22 1832)    ED Course/ Medical Decision Making/ A&P    71 year old history of diabetes, hypertension here for evaluation of feeling unwell.  Tested positive for COVID on Monday, symptoms over the last week.  Has been having headaches, dizziness and lightheadedness with orthostasis, palpitations and some shortness of breath.  No clinical evidence of VTE on exam.  She is tachycardic on arrival.  Heart and lungs clear.  Abdomen soft, nontender.  She has a nonfocal neuro exam without deficits.  Plan on labs, imaging and reassess  Labs and imaging personally viewed and interpreted:  CBC without leukocytosis Metabolic panel glucose 364, creatinine 1.3 Trop 8>>16 COVID neg? Poor specimen sample? Xray chest without cardiomegaly, pulm edema, pneumothorax CT head negative CTA chest with tree-in-bud suspicious for chronic infectious or inflammatory bronchiolitis  Patient reassessed.  Dems improved.  I discussed her labs and imaging here.  Unsure of her negative COVID test here was just due to poor sampling.  Either way she is currently on antiviral medication.  She already did a course of steroids from her PCP.  We will have her treat symptomatically.  Encourage increase fluids, rest.  This time I have low suspicion for acute bacterial etiology, PE, ACS, dissection as her cause shortness of breath.  Does not appear grossly fluid overload to suggest CHF exacerbation.  Has reassuring neurologic exam I have low suspicion for acute dissection, CVA, posterior circulation cause of dizziness.  Her tachycardia has improved with IV fluids.  Will have close follow-up with PCP, return for new or worsening symptoms.  The patient has been appropriately medically screened and/or stabilized in the ED. I have low  suspicion for any other emergent medical condition which would require further screening, evaluation or treatment in the ED or require inpatient management.  Patient is hemodynamically stable and in no acute distress.  Patient able to ambulate in department prior to ED.  Evaluation does not show acute pathology that would require ongoing or additional emergent interventions while in the emergency department or further inpatient treatment.  I have discussed the diagnosis with the patient and answered all questions.  Pain is been managed while in the emergency department and patient has no further complaints prior to discharge.  Patient is comfortable with plan discussed in room and is stable for discharge at this time.  I have discussed strict return precautions for returning to the emergency department.  Patient was encouraged to follow-up with PCP/specialist refer to at discharge.                            Medical Decision Making Amount and/or Complexity of Data Reviewed External Data Reviewed: labs, radiology, ECG and notes. Labs: ordered. Decision-making details documented in ED Course. Radiology: ordered and independent interpretation performed. Decision-making details documented in ED Course. ECG/medicine tests: ordered and independent interpretation performed. Decision-making details documented in ED Course.  Risk OTC drugs. Prescription drug management. Parenteral controlled substances. Decision regarding hospitalization. Diagnosis or treatment significantly limited by social determinants of health.         Final Clinical Impression(s) / ED Diagnoses Final diagnoses:  COVID  Lightheadedness  SOB (shortness of breath)    Rx / DC Orders ED Discharge Orders     None         Henderly, Britni A, PA-C 08/03/22 1856    Tegeler, Gwenyth Allegra, MD 08/04/22 2136

## 2022-08-03 NOTE — ED Triage Notes (Signed)
COVID positive on Monday.  Started on Paxlovid Monday night.  Today having headache and dizziness.  Ambulatory without difficulty.  States had chest pain yesterday across chest   Denies SOB or pain today

## 2022-08-05 ENCOUNTER — Encounter: Payer: Self-pay | Admitting: Family Medicine

## 2022-08-05 ENCOUNTER — Telehealth: Payer: Self-pay | Admitting: Family Medicine

## 2022-08-05 DIAGNOSIS — U071 COVID-19: Secondary | ICD-10-CM

## 2022-08-05 MED ORDER — MECLIZINE HCL 25 MG PO TABS: 25.0000 mg | ORAL_TABLET | Freq: Three times a day (TID) | ORAL | 0 refills | Status: AC | PRN

## 2022-08-05 MED ORDER — MECLIZINE HCL 25 MG PO TABS: 25.0000 mg | ORAL_TABLET | Freq: Three times a day (TID) | ORAL | 0 refills | Status: DC | PRN

## 2022-08-05 NOTE — Telephone Encounter (Signed)
Patient states she went to the ER where they told her she had RSV, she states that since then her vertigo and dizziness has not gotten better and would like medication sent to her pharmacy. Advised pt to set up OV but patient refused. Please advise.

## 2022-08-05 NOTE — Addendum Note (Signed)
Addended by: Jeronimo Greaves on: 08/05/2022 02:22 PM   Modules accepted: Orders

## 2022-08-05 NOTE — Telephone Encounter (Signed)
Are you okay with this? Looks like she has been covid pos as well.

## 2022-08-24 ENCOUNTER — Other Ambulatory Visit (HOSPITAL_BASED_OUTPATIENT_CLINIC_OR_DEPARTMENT_OTHER): Payer: Self-pay | Admitting: Family Medicine

## 2022-08-24 DIAGNOSIS — Z1231 Encounter for screening mammogram for malignant neoplasm of breast: Secondary | ICD-10-CM

## 2022-08-28 NOTE — Progress Notes (Unsigned)
[facility] [place], [place], [place] [address] [phone]  Date:  08/31/2022   Name:  Abigail [name]   DOB:  [birth year]   MRN:  [id number]  PCP:  Darreld Mclean, MD    Chief Complaint: chest heaviness (Last episode x 1 week ago/)   History of Present Illness:  Abigail [name] is a 71 y.o. very pleasant female patient who presents with the following:  Patient seen today with concern of back pain- History of diabetes, hyperlipidemia, hypertension, chronic renal insufficiency with nephrology follow-up Most recent visit with myself in July for routine recheck That time she was experiencing some caregiver stress as she took care of her elderly mother  She was ill with COVID-19  and RSV last month-seen in the ER on 9/27  About 10 days ago when she was out of town at [employer] she noted a heaviness in her chest which radiated to her back. She thought it might be reflux It bothered her more when she was walking such as walking her dog Might last 5-7 minutes  It got better but continues to bother her some It will come and go  She may have the pain at rest or with exertion  She has a bit of pain right now and feels like it can be hard for her to get a full breath    Lab Results  Component Value Date   HGBA1C 7.4 (H) 05/16/2022     Patient Active Problem List   Diagnosis Date Noted   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/29/2020   Essential hypertension    Gastroesophageal reflux disease without esophagitis    Microscopic hematuria 09/16/2015   Trimalleolar fracture of ankle, closed 11/17/2014   Hyperlipidemia associated with type 2 diabetes mellitus ([facility]) 07/24/2013   Chest pain 10/23/2012   Type 2 diabetes mellitus without complication, without long-term current use of insulin ([facility]) 10/08/2012   Anxiety 10/08/2012    Past Medical History:  Diagnosis Date   Anemia, unspecified    Anxiety    CKD (chronic kidney  disease), stage III (HCC)    Depression    Diabetes mellitus type II, non insulin dependent ([facility])    On Ozempic and Farxiga   GERD (gastroesophageal reflux disease)    Hematuria    Hyperlipidemia    Hypertension    Mild mitral regurgitation 08/2017   Essentially normal-mild MR on echo.   Normal coronary arteries    Coronary CTA: No evidence of CAD.  Coronary calcium score 0.   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    Past Surgical History:  Procedure Laterality Date   CHOLECYSTECTOMY N/A 06/03/2015   Procedure: LAPAROSCOPIC CHOLECYSTECTOMY WITH INTRAOPERATIVE CHOLANGIOGRAM;  Surgeon: Donnie Mesa, MD;  Location: [facility];  Service: General;  Laterality: N/A;   COLONOSCOPY     HAMMER TOE SURGERY     ORIF ANKLE FRACTURE Left 11/17/2014   Procedure: OPEN REDUCTION INTERNAL FIXATION (ORIF) LEFT TRIMALLEOLAR ANKLE FRACTURE;  Surgeon: Marianna Payment, MD;  Location: [facility];  Service: Orthopedics;  Laterality: Left;   OVARIAN CYST REMOVAL      Social History   Tobacco Use   Smoking status: Never   Smokeless tobacco: Never  Vaping Use   Vaping Use: Never used  Substance Use Topics   Alcohol use: Yes    Alcohol/week: 1.0 standard drink of alcohol    Types: 1 Glasses of wine per week  Drug use: No    Family History  Problem Relation Age of Onset   Diabetes Mother    Hyperlipidemia Mother    Diabetes Father    Heart disease Father 29       AMI x 2; first AMI age 61   Stroke Father    Hypertension Father    Hyperlipidemia Brother    AAA (abdominal aortic aneurysm) Brother    Congestive Heart Failure Brother        s/p ICD   Coronary artery disease Brother        defibrillator; CHF   Kidney cancer Brother        Status post nephrectomy   Other Sister 42       Died in a car accident    Allergies  Allergen Reactions   Pneumovax 23 [Pneumococcal Vac Polyvalent] Other (See Comments)    Pt had severe arm pain   Amaryl [Glimepiride] Nausea Only    Cephalexin Hives   Sulfa Antibiotics Rash    Medication list has been reviewed and updated.  Current Outpatient Medications on File Prior to Visit  Medication Sig Dispense Refill   Ascorbic Acid (VITAMIN C) 1000 MG tablet Take 1,000 mg by mouth daily.     atorvastatin (LIPITOR) 20 MG tablet TAKE ONE TABLET BY MOUTH ONCE DAILY 90 tablet 0   B Complex-C (SUPER B COMPLEX PO) Take 1 tablet by mouth daily.     blood glucose meter kit and supplies Dispense based on patient and insurance preference. Use up to four times daily as directed. (FOR ICD-10 E10.9, E11.9). 1 each 0   Blood Glucose Monitoring Suppl (ONE TOUCH ULTRA 2) w/Device KIT by Does not apply route.     calcium carbonate (TUMS - DOSED IN MG ELEMENTAL CALCIUM) 500 MG chewable tablet Chew 1 tablet by mouth daily.     Cholecalciferol (VITAMIN D3) 50 MCG (2000 UT) CAPS Take 1 capsule by mouth daily.     FARXIGA 10 MG TABS tablet Take 1 tablet (10 mg total) by mouth daily. For [place] and [place] assistance program 90 tablet 3   FLUoxetine (PROZAC) 40 MG capsule Take 1 capsule (40 mg total) by mouth daily. 90 capsule 3   fluticasone (FLONASE) 50 MCG/ACT nasal spray Place 2 sprays into both nostrils daily. 16 g 1   glucose blood test strip 1 each by Other route as needed for other. Use as instructed 100 each 3   meclizine (ANTIVERT) 25 MG tablet Take 1 tablet (25 mg total) by mouth 3 (three) times daily as needed for dizziness. 30 tablet 0   Multiple Vitamins-Minerals (ALIVE WOMENS 50+) TABS Take 1 tablet by mouth daily.     nitroGLYCERIN (NITROSTAT) 0.4 MG SL tablet PLACE 1 TABLET UNDER THE TONGUE EVERY 5 MINUTES AS NEEDED FOR CHEST PAIN 25 tablet 2   omeprazole (PRILOSEC) 20 MG capsule TAKE ONE CAPSULE BY MOUTH ONCE DAILY 90 capsule 3   OneTouch Delica Lancets 23J MISC Use to check glucose up to 4 times daily. Dx Code E11.9 400 each 1   promethazine-dextromethorphan (PROMETHAZINE-DM) 6.25-15 MG/5ML syrup Take 5 mLs by mouth 4 (four) times daily as  needed for cough. 118 mL 0   propranolol (INDERAL) 10 MG tablet TAKE ONE TABLET BY MOUTH every eight TO twelve hours AS NEEDED FOR tremors. 270 tablet 0   Semaglutide, 2 MG/DOSE, (OZEMPIC, 2 MG/DOSE,) 8 MG/3ML SOPN Inject 2 mg into the skin once a week. 3 mL 3   traZODone (  DESYREL) 150 MG tablet Take 1 tablet (150 mg total) by mouth at bedtime. 90 tablet 0   zinc gluconate 50 MG tablet Take 50 mg by mouth daily.     No current facility-administered medications on file prior to visit.    Review of Systems:  As per HPI- otherwise negative.   Physical Examination: Vitals:   08/31/22 1440  BP: 110/60  Pulse: 97  Resp: 18  Temp: 97.7 F (36.5 C)  SpO2: 97%   Vitals:   08/31/22 1440  Weight: 140 lb 6.4 oz (63.7 kg)   Body mass index is 23.36 kg/m. Ideal Body Weight:    GEN: no acute distress. Normal weight, looks well  HEENT: Atraumatic, Normocephalic.  Ears and Nose: No external deformity. CV: RRR, No M/G/R. No JVD. No thrill. No extra heart sounds. PULM: CTA B, no wheezes, crackles, rhonchi. No retractions. No resp. distress. No accessory muscle use. ABD: S, NT, ND, +BS. No rebound. No HSM. EXTR: No c/c/e PSYCH: Normally interactive. Conversant.   EKG:  SR with abnormality - possible lateral ischemia Assessment and Plan: Chest pain, unspecified type - Plan: EKG 12-Lead Pt seen today with suspicious chest pain.  Sent to the ER here at the [facility] for further evaluation - accompanied to staff to the front desk, confirmed check in and gave report to Taylor, MD

## 2022-08-28 NOTE — Patient Instructions (Signed)
It was great to see you again today as always Consider a dose of RSV this fall.  Can wait about 2 months since COVID-19 infection before you get the next vaccine

## 2022-08-29 ENCOUNTER — Ambulatory Visit: Payer: PPO | Admitting: Pharmacist

## 2022-08-29 DIAGNOSIS — E1122 Type 2 diabetes mellitus with diabetic chronic kidney disease: Secondary | ICD-10-CM | POA: Diagnosis not present

## 2022-08-29 DIAGNOSIS — N1832 Chronic kidney disease, stage 3b: Secondary | ICD-10-CM | POA: Diagnosis not present

## 2022-08-29 DIAGNOSIS — E119 Type 2 diabetes mellitus without complications: Secondary | ICD-10-CM

## 2022-08-29 DIAGNOSIS — I129 Hypertensive chronic kidney disease with stage 1 through stage 4 chronic kidney disease, or unspecified chronic kidney disease: Secondary | ICD-10-CM | POA: Diagnosis not present

## 2022-08-29 NOTE — Progress Notes (Signed)
Pharmacy Note  08/29/2022 Name: Abigail [name] MRN: [id number] DOB: September 13, 1951  Subjective: Abigail [name] is a 71 y.o. year old female who is a primary care patient of Copland, Gay Filler, MD. Clinical Pharmacist Practitioner referral was placed to assist with medication management and assistance with medication assistance program for Ozempic.  Engaged with patient by telephone for follow up visit today.  Our office received mailing with 0601 application for [employer] medication assistance program. Completed provider portion and faxed to [employer]. Called patient to see if she has received mailing with patient portion of application but she had not yet.  Patient will be in office Wednesday October 25th.   SDOH (Social Determinants of Health) assessments and interventions performed:  SDOH Interventions    Flowsheet Row Chronic Care Management from 11/12/2021 in [facility] at [place] Management from 09/07/2021 in [place] at [place] Management from 06/07/2021 in [place] at [place] Management from 03/01/2021 in [place] at [place] Management from 01/25/2021 in [place] at [place] Interventions       Housing Interventions -- -- Intervention Not Indicated -- --  Transportation Interventions -- Intervention Not Indicated -- -- --  Financial Strain Interventions Other (Comment)  [assisting with application for PAP for 2023 for Ozempic and Farxiga] Other (Comment)  [getting Ozempic and Wilder Glade from MAP] Other (Comment)  [Clinical pharmacist has assisted patient with getting Wilder Glade and Ozempic.] Other (Comment)  [Assisting patient with applying for manufacturer assistance programs for brand medications - Farxiga and Ozempic] Other (Comment)  [Will apply for MAP for  Farxiga and Ozempic]        Objective: Review of patient status, including review of consultants reports, laboratory and other test data, was performed as part of comprehensive evaluation and provision of chronic care management services.   Lab Results  Component Value Date   CREATININE 1.30 (H) 08/03/2022   CREATININE 1.58 (H) 05/16/2022   CREATININE 1.53 (H) 12/06/2021    Lab Results  Component Value Date   HGBA1C 7.4 (H) 05/16/2022       Component Value Date/Time   CHOL 148 12/06/2021 1347   TRIG 168.0 (H) 12/06/2021 1347   HDL 48.40 12/06/2021 1347   CHOLHDL 3 12/06/2021 1347   VLDL 33.6 12/06/2021 1347   LDLCALC 66 12/06/2021 1347      BP Readings from Last 3 Encounters:  08/03/22 (!) 161/73  07/27/22 118/78  05/16/22 110/64     Allergies  Allergen Reactions   Pneumovax 23 [Pneumococcal Vac Polyvalent] Other (See Comments)    Pt had severe arm pain   Amaryl [Glimepiride] Nausea Only   Cephalexin Hives   Sulfa Antibiotics Rash    Medications Reviewed Today     Reviewed by Cherre Robins, RPH-CPP (Pharmacist) on 08/29/22 at [place] List Status: <None>   Medication Order Taking? Sig Documenting Provider Last Dose Status Informant  Ascorbic Acid (VITAMIN C) 1000 MG tablet [id number] No Take 1,000 mg by mouth daily. [provider] Taking Active   atorvastatin (LIPITOR) 20 MG tablet [id number] No TAKE ONE TABLET BY MOUTH ONCE DAILY Copland, Gay Filler, MD Taking Active   B Complex-C (SUPER B COMPLEX PO) [id number] No Take 1 tablet by mouth daily. [provider] Taking Active   blood glucose meter kit and supplies [id number] No Dispense based  on patient and insurance preference. Use up to four times daily as directed. (FOR ICD-10 E10.9, E11.9). Copland, Gay Filler, MD Taking Active   Blood Glucose Monitoring Suppl (ONE TOUCH ULTRA 2) w/Device KIT [id number] No by Does not apply route. [provider] Taking Active   calcium carbonate  (TUMS - DOSED IN MG ELEMENTAL CALCIUM) 500 MG chewable tablet [id number] No Chew 1 tablet by mouth daily. [provider] Taking Active   Cholecalciferol (VITAMIN D3) 50 MCG (2000 UT) CAPS [id number] No Take 1 capsule by mouth daily. [provider] Taking Active   FARXIGA 10 MG TABS tablet [id number] No Take 1 tablet (10 mg total) by mouth daily. For [place] and [place] assistance program Copland, Gay Filler, MD Taking Active   FLUoxetine (PROZAC) 40 MG capsule [id number] No Take 1 capsule (40 mg total) by mouth daily. Copland, Gay Filler, MD Taking Active   fluticasone (FLONASE) 50 MCG/ACT nasal spray [id number] No Place 2 sprays into both nostrils daily. Saguier, Percell Miller, PA-C Taking Active   glucose blood test strip [id number] No 1 each by Other route as needed for other. Use as instructed Copland, Gay Filler, MD Taking Active   meclizine (ANTIVERT) 25 MG tablet [id number]  Take 1 tablet (25 mg total) by mouth 3 (three) times daily as needed for dizziness. Copland, Gay Filler, MD  Active   Multiple Vitamins-Minerals (ALIVE WOMENS 50+) TABS [id number] No Take 1 tablet by mouth daily. [provider] Taking Active   nitroGLYCERIN (NITROSTAT) 0.4 MG SL tablet [id number] No PLACE 1 TABLET UNDER THE TONGUE EVERY 5 MINUTES AS NEEDED FOR CHEST PAIN Copland, Gay Filler, MD Taking Active            Med Note [place]   Fri Jun 12, 2020  4:22 PM) Has not had to use, but has it just in case  omeprazole (PRILOSEC) 20 MG capsule [id number] No TAKE ONE CAPSULE BY MOUTH ONCE DAILY Copland, Gay Filler, MD Taking Active   OneTouch Delica Lancets 73A MISC [id number] No Use to check glucose up to 4 times daily. Dx Code E11.9 Copland, Gay Filler, MD Taking Active   promethazine-dextromethorphan (PROMETHAZINE-DM) 6.25-15 MG/5ML syrup [id number]  Take 5 mLs by mouth 4 (four) times daily as needed for cough. Shelda Pal, DO  Active   propranolol (INDERAL) 10 MG tablet [id number] No TAKE ONE TABLET BY MOUTH  every eight TO twelve hours AS NEEDED FOR tremors. Copland, Gay Filler, MD Taking Active   Semaglutide, 2 MG/DOSE, (OZEMPIC, 2 MG/DOSE,) 8 MG/3ML SOPN [id number] No Inject 2 mg into the skin once a week. Copland, Gay Filler, MD Taking Active   traZODone (DESYREL) 150 MG tablet [id number] No Take 1 tablet (150 mg total) by mouth at bedtime. Copland, Gay Filler, MD Taking Active   zinc gluconate 50 MG tablet [id number] No Take 50 mg by mouth daily. [provider] Taking Active             Patient Active Problem List   Diagnosis Date Noted   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/29/2020   Essential hypertension    Gastroesophageal reflux disease without esophagitis    Microscopic hematuria 09/16/2015   Trimalleolar fracture of ankle, closed 11/17/2014   Hyperlipidemia associated with type 2 diabetes mellitus ([place]) 07/24/2013   Chest pain 10/23/2012   Type 2 diabetes mellitus without complication, without long-term current use of insulin ([place]) 10/08/2012   Anxiety 10/08/2012     Medication Assistance:  Application for [employer] / [employer] 2024  medication assistance program. in process.  Anticipated assistance start date 11/07/2022.  See plan of care for additional detail.   Assessment / Plan: Medication management Faxed provider portion of 2024 medication assistance program to [employer]. Patient is due to be in office 08/31/2022. She will sign patient portion.  Continue Ozempic 53m weekly Continue Farxiga daily - [facility] [place] [place]   Follow Up:  Telephone follow up appointment with care management team member scheduled for:  2 weeks.    TCherre Robins PharmD Clinical Pharmacist [place] [phone]

## 2022-08-31 ENCOUNTER — Other Ambulatory Visit: Payer: Self-pay

## 2022-08-31 ENCOUNTER — Encounter (HOSPITAL_BASED_OUTPATIENT_CLINIC_OR_DEPARTMENT_OTHER): Payer: Self-pay | Admitting: Emergency Medicine

## 2022-08-31 ENCOUNTER — Ambulatory Visit (HOSPITAL_BASED_OUTPATIENT_CLINIC_OR_DEPARTMENT_OTHER)
Admission: RE | Admit: 2022-08-31 | Discharge: 2022-08-31 | Disposition: A | Discharge status: Home / Self Care | Payer: PPO | Source: Ambulatory Visit | Attending: Family Medicine | Admitting: Family Medicine

## 2022-08-31 ENCOUNTER — Emergency Department (HOSPITAL_BASED_OUTPATIENT_CLINIC_OR_DEPARTMENT_OTHER)
Admission: EM | Admit: 2022-08-31 | Discharge: 2022-09-01 | Disposition: A | Discharge status: Home / Self Care | Payer: PPO | Attending: Emergency Medicine | Admitting: Emergency Medicine

## 2022-08-31 ENCOUNTER — Ambulatory Visit (INDEPENDENT_AMBULATORY_CARE_PROVIDER_SITE_OTHER): Payer: PPO | Admitting: Family Medicine

## 2022-08-31 ENCOUNTER — Emergency Department (HOSPITAL_BASED_OUTPATIENT_CLINIC_OR_DEPARTMENT_OTHER): Payer: PPO

## 2022-08-31 ENCOUNTER — Encounter (HOSPITAL_BASED_OUTPATIENT_CLINIC_OR_DEPARTMENT_OTHER): Payer: Self-pay

## 2022-08-31 VITALS — BP 110/60 | HR 97 | Temp 97.7°F | Resp 18 | Wt 140.4 lb

## 2022-08-31 DIAGNOSIS — Z1231 Encounter for screening mammogram for malignant neoplasm of breast: Secondary | ICD-10-CM | POA: Insufficient documentation

## 2022-08-31 DIAGNOSIS — D696 Thrombocytopenia, unspecified: Secondary | ICD-10-CM | POA: Insufficient documentation

## 2022-08-31 DIAGNOSIS — E119 Type 2 diabetes mellitus without complications: Secondary | ICD-10-CM | POA: Insufficient documentation

## 2022-08-31 DIAGNOSIS — R079 Chest pain, unspecified: Secondary | ICD-10-CM | POA: Diagnosis not present

## 2022-08-31 DIAGNOSIS — Z79899 Other long term (current) drug therapy: Secondary | ICD-10-CM | POA: Insufficient documentation

## 2022-08-31 DIAGNOSIS — I10 Essential (primary) hypertension: Secondary | ICD-10-CM | POA: Diagnosis not present

## 2022-08-31 DIAGNOSIS — R0789 Other chest pain: Secondary | ICD-10-CM | POA: Diagnosis not present

## 2022-08-31 DIAGNOSIS — D649 Anemia, unspecified: Secondary | ICD-10-CM | POA: Diagnosis not present

## 2022-08-31 LAB — CBC
HCT: 33.6 % — ABNORMAL LOW (ref 36.0–46.0)
Hemoglobin: 11 g/dL — ABNORMAL LOW (ref 12.0–15.0)
MCH: 28.6 pg (ref 26.0–34.0)
MCHC: 32.7 g/dL (ref 30.0–36.0)
MCV: 87.3 fL (ref 80.0–100.0)
Platelets: 45 10*3/uL — ABNORMAL LOW (ref 150–400)
RBC: 3.85 MIL/uL — ABNORMAL LOW (ref 3.87–5.11)
RDW: 12.6 % (ref 11.5–15.5)
WBC: 4 10*3/uL (ref 4.0–10.5)
nRBC: 0 % (ref 0.0–0.2)

## 2022-08-31 LAB — TROPONIN I (HIGH SENSITIVITY)
Troponin I (High Sensitivity): 4 ng/L (ref <18)
Troponin I (High Sensitivity): 5 ng/L (ref <18)

## 2022-08-31 LAB — BASIC METABOLIC PANEL
Anion gap: 11 (ref 5–15)
BUN: 22 mg/dL (ref 8–23)
CO2: 27 mmol/L (ref 22–32)
Calcium: 9.1 mg/dL (ref 8.9–10.3)
Chloride: 100 mmol/L (ref 98–111)
Creatinine, Ser: 1.63 mg/dL — ABNORMAL HIGH (ref 0.44–1.00)
GFR, Estimated: 33 mL/min — ABNORMAL LOW (ref >60)
Glucose, Bld: 137 mg/dL — ABNORMAL HIGH (ref 70–99)
Potassium: 4.5 mmol/L (ref 3.5–5.1)
Sodium: 138 mmol/L (ref 135–145)

## 2022-08-31 LAB — OCCULT BLOOD X 1 CARD TO LAB, STOOL: Fecal Occult Bld: NEGATIVE

## 2022-08-31 MED ORDER — ASPIRIN 81 MG PO CHEW
324.0000 mg | CHEWABLE_TABLET | Freq: Once | ORAL | Status: AC
  Administered 2022-08-31: 324 mg via ORAL
  Filled 2022-08-31: qty 4

## 2022-08-31 NOTE — ED Provider Notes (Signed)
[facility] EMERGENCY DEPARTMENT Provider Note   CSN: [id number] Arrival date & time: 08/31/22  1532     History  Chief Complaint  Patient presents with   Chest Pain    Abigail [name] is a 71 y.o. female.  71 y/o female with hx of HTN, HLD, DM, GERD presents to the ED for evaluation of chest pain x 1 week. Pain described as a "pressure" which radiates to her back. Pain is brought on by activity. She noticed it first when she was walking her dog. Over the past 4 days, pain has been occurring at rest as well as during activity. She feels like she needs to take a deep breath when her chest pressure is present, but denies overtly being SOB. She further denies fever, syncope/near syncope, leg swelling, jaw pain, diaphoresis, vomiting, extremity numbness or paresthesias, extremity weakness. She has not taken any medications for her symptoms.  Has hx of CAD in father; patient believes in his 66's. No personal hx of ACS/MI or VTE. Nonsmoker. 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 Lost her husband 3 years ago.  The history is provided by the patient. No language interpreter was used.  Chest Pain      Home Medications Prior to Admission medications   Medication Sig Start Date End Date Taking? Authorizing Provider  Ascorbic Acid (VITAMIN C) 1000 MG tablet Take 1,000 mg by mouth daily.    [provider]  atorvastatin (LIPITOR) 20 MG tablet TAKE ONE TABLET BY MOUTH ONCE DAILY 06/30/22   Copland, Gay Filler, MD  B Complex-C (SUPER B COMPLEX PO) Take 1 tablet by mouth daily.    [provider]  blood glucose meter kit and supplies Dispense based on patient and insurance preference. Use up to four times daily as directed. (FOR ICD-10 E10.9, E11.9). 07/02/19   Copland, Gay Filler, MD  Blood Glucose Monitoring Suppl (ONE TOUCH ULTRA 2) w/Device KIT by Does not apply route.    [provider]  calcium carbonate (TUMS - DOSED IN MG ELEMENTAL CALCIUM) 500 MG chewable tablet Chew 1 tablet by mouth daily.    [provider]  Cholecalciferol (VITAMIN D3) 50 MCG (2000 UT) CAPS Take 1 capsule by mouth daily.    [provider]  FARXIGA 10 MG TABS tablet Take 1 tablet (10 mg total) by mouth daily. For [place] and [place] assistance program 10/13/21   Copland, Gay Filler, MD  FLUoxetine (PROZAC) 40 MG capsule Take 1 capsule (40 mg total) by mouth daily. 05/16/22   Copland, Gay Filler, MD  fluticasone (FLONASE) 50 MCG/ACT nasal spray Place 2 sprays into both nostrils daily. 03/15/21   Saguier, Percell Miller, PA-C  glucose blood test strip 1 each by Other route as needed for other. Use as instructed 05/25/22   Copland, Gay Filler, MD  meclizine (ANTIVERT) 25 MG tablet Take 1 tablet (25 mg total) by mouth 3 (three) times daily as needed for dizziness. 08/05/22   Copland, Gay Filler, MD  Multiple Vitamins-Minerals (ALIVE WOMENS 50+) TABS Take 1 tablet by mouth daily.    [provider]  nitroGLYCERIN (NITROSTAT) 0.4 MG SL tablet PLACE 1 TABLET UNDER THE TONGUE EVERY 5 MINUTES AS NEEDED FOR CHEST PAIN 05/05/20   Copland, Gay Filler, MD  omeprazole (PRILOSEC) 20 MG capsule TAKE ONE CAPSULE BY MOUTH ONCE DAILY 03/28/22   Copland, Gay Filler, MD  OneTouch Delica Lancets 02D  MISC Use to check glucose up to 4 times daily. Dx Code E11.9 07/16/20   Copland, Gay Filler, MD  promethazine-dextromethorphan (PROMETHAZINE-DM) 6.25-15 MG/5ML syrup Take 5 mLs by mouth 4 (four) times daily as needed for cough. 07/27/22   Shelda Pal, DO  propranolol (INDERAL) 10 MG tablet TAKE ONE TABLET BY MOUTH every eight TO twelve hours AS NEEDED FOR tremors. 06/30/22   Copland, Gay Filler, MD  Semaglutide, 2 MG/DOSE, (OZEMPIC, 2 MG/DOSE,) 8 MG/3ML SOPN Inject 2 mg into the skin once a week. 06/30/22   Copland, Gay Filler, MD  traZODone (DESYREL) 150 MG tablet Take 1 tablet (150 mg total) by mouth at bedtime. 06/30/22    Copland, Gay Filler, MD  zinc gluconate 50 MG tablet Take 50 mg by mouth daily.    [provider]      Allergies    Pneumovax 23 [pneumococcal vac polyvalent], Amaryl [glimepiride], Cephalexin, and Sulfa antibiotics    Review of Systems   Review of Systems  Cardiovascular:  Positive for chest pain.  Ten systems reviewed and are negative for acute change, except as noted in the HPI.    Physical Exam Updated Vital Signs BP (!) 140/78 (BP Location: Left Arm)   Pulse 86   Temp 98.7 F (37.1 C) (Oral)   Resp 20   Ht 5' 5"  (1.651 m)   Wt 59 kg   SpO2 97%   BMI 21.63 kg/m   Physical Exam Vitals and nursing note reviewed.  Constitutional:      General: She is not in acute distress.    Appearance: She is well-developed. She is not toxic-appearing or diaphoretic.     Comments: Alert, pleasant and in NAD  HENT:     Head: Normocephalic and atraumatic.  Eyes:     General: No scleral icterus.    Conjunctiva/sclera: Conjunctivae normal.  Cardiovascular:     Rate and Rhythm: Normal rate and regular rhythm.     Pulses: Normal pulses.  Pulmonary:     Effort: Pulmonary effort is normal. No respiratory distress.     Breath sounds: No stridor. No wheezing or rales.     Comments: Lungs CTAB Musculoskeletal:        General: Normal range of motion.     Cervical back: Normal range of motion.     Comments: No BLE edema  Skin:    General: Skin is warm and dry.     Coloration: Skin is not pale.     Findings: No erythema or rash.  Neurological:     Mental Status: She is alert and oriented to person, place, and time.     Coordination: Coordination normal.  Psychiatric:        Behavior: Behavior normal.     ED Results / Procedures / Treatments   Labs (all labs ordered are listed, but only abnormal results are displayed) Labs Reviewed  BASIC METABOLIC PANEL - Abnormal; Notable for the following components:      Result Value   Glucose, Bld 137 (*)    Creatinine, Ser 1.63  (*)    GFR, Estimated 33 (*)    All other components within normal limits  CBC - Abnormal; Notable for the following components:   RBC 3.85 (*)    Hemoglobin 11.0 (*)    HCT 33.6 (*)    Platelets 45 (*)    All other components within normal limits  OCCULT BLOOD X 1 CARD TO LAB, STOOL  TROPONIN I (HIGH SENSITIVITY)  TROPONIN I (HIGH SENSITIVITY)    EKG EKG Interpretation  Date/Time:  Wednesday August 31 2022 15:47:15 EDT Ventricular Rate:  87 PR Interval:  156 QRS Duration: 126 QT Interval:  409 QTC Calculation: 493 R Axis:   13 Text Interpretation: Sinus rhythm Ventricular premature complex IVCD, consider atypical LBBB No significant change since prior 2/22 Confirmed by Aletta Edouard [id number]) on 08/31/2022 3:51:53 PM  Radiology DG Chest 2 View  Result Date: 08/31/2022 CLINICAL DATA:  Chest pain EXAM: CHEST - 2 VIEW COMPARISON:  Chest 08/03/2022 FINDINGS: The heart size and mediastinal contours are within normal limits. Both lungs are clear. The visualized skeletal structures are unremarkable. IMPRESSION: No active cardiopulmonary disease. Electronically Signed   By: Franchot Gallo M.D.   On: 08/31/2022 16:19    Procedures Procedures    Medications Ordered in ED Medications  aspirin chewable tablet 324 mg (324 mg Oral Given 08/31/22 1603)    ED Course/ Medical Decision Making/ A&P Clinical Course as of 09/01/22 1626  Wed Aug 31, 2022  1600 Patient's chart reviewed. She had a Coronary CT in 2018 with a calcium score of 0. Echo during this admission was reassuring with LVEF of 55-60%.   Also more recently underwent CT PE study in September 2023 which was negative. From chart review, appears that patient's prior presentations for chest pain/pressure have included radiation to the back. CT dissection study in 2022 during ED assessment for similar chest pain was negative. [BP]  1121 EKG reviewed, showing LBBB which patient has had previously. [KK]  4469 Heart score is 6  c/w moderate risk of ACS. [KH]  1816 Hemoccult completed given new anemia, thrombocytopenia. No gross blood or melena on DRE. Patient denies changes in bruising/bleeding, melena, hematochezia, hematuria, hematemesis.  [KH]  6853 71 year old female sent down from primary care doctor's office for evaluation of some heaviness in her chest and pain in her upper back.  EKG showing bundle branch block and initial troponin negative.  She is pending a second [place].  She does have a new thrombocytopenia.  Hemoglobin also slightly lower.  No evidence of active bleeding and she is very well-appearing.  Likely discharge after second troponin can follow-up with outpatient to further evaluate this. [MB]    Clinical Course User Index [KH] Antonietta Breach, PA-C [MB] Hayden Rasmussen, MD                           Medical Decision Making Amount and/or Complexity of Data Reviewed Labs: ordered. Radiology: ordered.  Risk OTC drugs.   This patient presents to the ED for concern of chest pain, this involves an extensive number of treatment options, and is a complaint that carries with it a high risk of complications and morbidity.  The differential diagnosis includes stable angina vs ACS vs stress reaction vs CHF vs PNA vs PTX vs dissection vs PUD/gastritis vs esophageal spasm vs Boerhaave's.    Co morbidities that complicate the patient evaluation  DM HTN HLD   Additional history obtained:  Additional history obtained from PCP External records from outside source obtained and reviewed including CT PE study from 07/2022 and CT dissection study from 12/2020. Echo and coronary CT from 2018 also reviewed.   Lab Tests:  I Ordered, and personally interpreted labs.  The pertinent results include:  New anemia of 11 (baseline 12-13), new thrombocytopenia of 45 (baseline ~250). Hemoccult negative. Creatinine stable at 1.63. Negative troponin x 2.   Imaging  Studies ordered:  I ordered imaging studies including  CXR  I independently visualized and interpreted imaging which showed no acute cardiopulmonary abnormality. No mediastinal widening to suggest dissection I agree with the radiologist interpretation   Cardiac Monitoring:  The patient was maintained on a cardiac monitor.  I personally viewed and interpreted the cardiac monitored which showed an underlying rhythm of: NSR   Medicines ordered and prescription drug management:  I ordered medication including ASA for chest pain  Reevaluation of the patient after these medicines showed that the patient stayed the same I have reviewed the patients home medicines and have made adjustments as needed   Test Considered:  CTA   Problem List / ED Course:  As above   Reevaluation:  After the interventions noted above, I reevaluated the patient and found that they have :improved   Social Determinants of Health:  Reliable for follow up Insured patient Caregiver of mother with dementia   Dispostion:  After consideration of the diagnostic results and the patients response to treatment, I feel that the patent would benefit from close outpatient cardiology follow up. Have also advised repeat CBC with her PCP in 1-2 weeks to reassess anemia, thrombocytopenia. No physical exam findings or history to suggest acute/emergent blood loss. Return precautions discussed and provided. Patient discharged in stable condition with no unaddressed concerns.          Final Clinical Impression(s) / ED Diagnoses Final diagnoses:  Nonspecific chest pain  Acute anemia  Thrombocytopenia [facility])    Rx / DC Orders ED Discharge Orders     None         Antonietta Breach, PA-C 09/01/22 1626    Hayden Rasmussen, MD 09/02/22 1810

## 2022-08-31 NOTE — ED Notes (Signed)
Patient transported to X-ray 

## 2022-08-31 NOTE — ED Triage Notes (Signed)
Chest pressure x 10 days , pain radiating to back , also reports Hx GERD . Denies shortness of breath , sent from PCP for further evaluation

## 2022-09-01 NOTE — Discharge Instructions (Signed)
Your evaluation in the ED today was reassuring.  We do recommend close follow-up with your primary care doctor as well as a cardiologist.  Specifically, you were found to have mild anemia and a lower platelet count from your typical baseline.  This should be rechecked by your primary care doctor in 1 to 2 weeks to ensure improvement.  If these abnormal values persist, you may require referral to a hematologist for further evaluation.  We do not believe that these results are responsible for your chest discomfort today.  Return to the ED for new or concerning symptoms.

## 2022-09-09 ENCOUNTER — Telehealth: Payer: PPO

## 2022-09-16 ENCOUNTER — Telehealth: Payer: PPO

## 2022-09-23 ENCOUNTER — Telehealth: Payer: Self-pay

## 2022-09-23 NOTE — Telephone Encounter (Signed)
Mailed [place] renewal application to patient home.   Please have office staff fax to [phone] ATTENTION Abigail [name]. Patient will be returning to office.   Marlin Brys L. CPhT Rx Patient Advocate 

## 2022-10-04 ENCOUNTER — Telehealth: Payer: Self-pay

## 2022-10-04 NOTE — Telephone Encounter (Signed)
Called to let pt know that pt assistance Ozempic is here in the office.   Left vm to return call.

## 2022-10-05 ENCOUNTER — Telehealth: Payer: Self-pay | Admitting: Pharmacist

## 2022-10-05 NOTE — Telephone Encounter (Signed)
Patient dropped off signed application for [employer] '2mg'$  for 2024. Provider had already signed. Reviewed application and faxed to [employer] medication assistance program.

## 2022-10-10 ENCOUNTER — Other Ambulatory Visit: Payer: Self-pay | Admitting: Family Medicine

## 2022-10-10 DIAGNOSIS — E785 Hyperlipidemia, unspecified: Secondary | ICD-10-CM

## 2022-10-10 DIAGNOSIS — G250 Essential tremor: Secondary | ICD-10-CM

## 2022-10-10 DIAGNOSIS — F329 Major depressive disorder, single episode, unspecified: Secondary | ICD-10-CM

## 2022-10-11 NOTE — Telephone Encounter (Signed)
I have received patient portion AZ&ME for [facility]   FAXED PCP page(s) to office. Please complete and sign and fax  to [phone] ATTENTION Yazmeen Woolf.  Sandre Kitty Rx Patient Advocate

## 2022-10-12 NOTE — Telephone Encounter (Signed)
Pt called to follow up on refill status for trazodone. Advised pt that we had received the request to refill from the pharmacy and a note would be sent back to look into this matter. Pt acknowledged understanding and asked if it could be sent in before 10a today to see about having it delivered today

## 2022-10-27 DIAGNOSIS — R059 Cough, unspecified: Secondary | ICD-10-CM | POA: Diagnosis not present

## 2022-10-27 DIAGNOSIS — J209 Acute bronchitis, unspecified: Secondary | ICD-10-CM | POA: Diagnosis not present

## 2022-10-27 DIAGNOSIS — R0981 Nasal congestion: Secondary | ICD-10-CM | POA: Diagnosis not present

## 2022-10-29 ENCOUNTER — Other Ambulatory Visit: Payer: Self-pay | Admitting: Family Medicine

## 2022-10-29 DIAGNOSIS — F329 Major depressive disorder, single episode, unspecified: Secondary | ICD-10-CM

## 2022-11-07 HISTORY — PX: GUM SURGERY: SHX658

## 2022-11-08 ENCOUNTER — Ambulatory Visit (INDEPENDENT_AMBULATORY_CARE_PROVIDER_SITE_OTHER): Payer: PPO | Admitting: Family Medicine

## 2022-11-08 ENCOUNTER — Encounter: Payer: Self-pay | Admitting: Family Medicine

## 2022-11-08 VITALS — BP 128/80 | HR 111 | Temp 98.0°F | Ht 65.0 in | Wt 143.1 lb

## 2022-11-08 DIAGNOSIS — J0100 Acute maxillary sinusitis, unspecified: Secondary | ICD-10-CM

## 2022-11-08 MED ORDER — DOXYCYCLINE HYCLATE 100 MG PO TABS: 100.0000 mg | ORAL_TABLET | Freq: Two times a day (BID) | ORAL | 0 refills | Status: DC

## 2022-11-08 MED ORDER — FLUCONAZOLE 150 MG PO TABS: ORAL_TABLET | ORAL | 0 refills | Status: DC

## 2022-11-08 MED ORDER — PROMETHAZINE-DM 6.25-15 MG/5ML PO SYRP: 5.0000 mL | ORAL_SOLUTION | Freq: Four times a day (QID) | ORAL | 0 refills | Status: DC | PRN

## 2022-11-08 NOTE — Progress Notes (Signed)
Chief Complaint  Patient presents with   Facial Pain    Ear pain    Cough    Abigail [name] here for URI complaints.  Duration: 2 weeks  Associated symptoms: sinus congestion, sinus pain, rhinorrhea, ear pain, sore throat, and coughing Denies: itchy watery eyes, ear drainage, wheezing, shortness of breath, myalgia, and fevers Treatment to date: Prednisone, erythromycin?, Zyrtec, Mucinex Sick contacts: No  Past Medical History:  Diagnosis Date   Anemia, unspecified    Anxiety    CKD (chronic kidney disease), stage III ([place])    Depression    Diabetes mellitus type II, non insulin dependent ([place])    On Ozempic and Farxiga   GERD (gastroesophageal reflux disease)    Hematuria    Hyperlipidemia    Hypertension    Mild mitral regurgitation 08/2017   Essentially normal-mild MR on echo.   Normal coronary arteries    Coronary CTA: No evidence of CAD.  Coronary calcium score 0.   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, [facility]    Objective BP 128/80 (BP Location: Left Arm, Patient Position: Sitting, Cuff Size: Normal)   Pulse (!) 111   Temp 98 F (36.7 C) (Oral)   Ht '5\' 5"'$  (1.651 m)   Wt 143 lb 2 oz (64.9 kg)   SpO2 98%   BMI 23.82 kg/m  General: Awake, alert, appears stated age HEENT: AT, [place], ears patent b/l and TM's neg, nares patent w/o discharge, pharynx pink and without exudates, MMM, +tt over max sinuses b/l Neck: No masses or asymmetry Heart: tachycardic, reg rhythm Lungs: CTAB, no accessory muscle use Psych: Age appropriate judgment and insight, normal mood and affect  Acute maxillary sinusitis, recurrence not specified - Plan: doxycycline (VIBRA-TABS) 100 MG tablet, fluconazole (DIFLUCAN) 150 MG tablet, promethazine-dextromethorphan (PROMETHAZINE-DM) 6.25-15 MG/5ML syrup  Given duration, will tx w abx. Syrup for coughing which she has tolerated well in past. Diflucan should she develop a yeast infection from abx. Continue to push fluids, practice good  hand hygiene, cover mouth when coughing. F/u prn. If starting to experience fevers, shaking, or shortness of breath, seek immediate care. Pt voiced understanding and agreement to the plan.  [place], DO 11/08/22 10:44 AM

## 2022-11-08 NOTE — Patient Instructions (Signed)
Continue to push fluids, practice good hand hygiene, and cover your mouth if you cough. ? ?If you start having fevers, shaking or shortness of breath, seek immediate care. ? ?OK to take Tylenol 1000 mg (2 extra strength tabs) or 975 mg (3 regular strength tabs) every 6 hours as needed. ? ?Let us know if you need anything. ?

## 2022-11-10 NOTE — Telephone Encounter (Signed)
FAXED PCP page(s) to office on 10/11/2022 and 2nd fax on 11/10/2022. Please complete and sign and fax  to [phone] ATTENTION Pegah Segel.   Sandre Kitty Rx Patient Advocate

## 2022-11-24 NOTE — Telephone Encounter (Signed)
Submitted application on 19/47/1252  for FARXIGA to [place] for patient assistance.   Phone: [phone]

## 2022-12-12 NOTE — Progress Notes (Addendum)
[place] [facility] [address], [place], [place] [address] [phone]  Date:  12/15/2022   Name:  Abigail [name]   DOB:  01/26/1951   MRN:  [id number]  PCP:  Darreld Mclean, MD    Chief Complaint: Annual Exam (Pt states not fasting )   History of Present Illness:  Abigail [name] is a 72 y.o. very pleasant female patient who presents with the following:  Patient is seen today for physical exam Most recent visit with myself was in [place] that time she was having chest pain I had to refer her to the ER.  She was evaluated and released to home She lost her mom on 11/17- this has been a hard loss for her  History of diabetes, hyperlipidemia, hypertension, chronic renal insufficiency with nephrology follow-up  Most recent nephrology note from November Due for urine microalbumin Recommend COVID booster A1c is due- update today  Foot exam is due today  Flu vaccine- give today   Atorvastatin 20 Farxiga 10 Fluoxetine Ozempic 2 mg Trazodone at bedtime  She has been using propranolol 10 once or twice daily for tremor- however it did not seem to help.  She also wonders if this could be contributing to some vertigo she has noted  This may occur maybe once a week or so May last for 5 minutes or so  She would like to see neurology to discuss her tremor   BMP, CBC done October Patient Active Problem List   Diagnosis Date Noted   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/29/2020   Essential hypertension    Gastroesophageal reflux disease without esophagitis    Microscopic hematuria 09/16/2015   Trimalleolar fracture of ankle, closed 11/17/2014   Hyperlipidemia associated with type 2 diabetes mellitus ([place]) 07/24/2013   Chest pain 10/23/2012   Type 2 diabetes mellitus without complication, without long-term current use of insulin ([place]) 10/08/2012   Anxiety 10/08/2012    Past Medical History:  Diagnosis Date   Anemia, unspecified     Anxiety    CKD (chronic kidney disease), stage III ([place])    Depression    Diabetes mellitus type II, non insulin dependent ([place])    On Ozempic and Farxiga   GERD (gastroesophageal reflux disease)    Hematuria    Hyperlipidemia    Hypertension    Mild mitral regurgitation 08/2017   Essentially normal-mild MR on echo.   Normal coronary arteries    Coronary CTA: No evidence of CAD.  Coronary calcium score 0.   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    Past Surgical History:  Procedure Laterality Date   CHOLECYSTECTOMY N/A 06/03/2015   Procedure: LAPAROSCOPIC CHOLECYSTECTOMY WITH INTRAOPERATIVE CHOLANGIOGRAM;  Surgeon: Donnie Mesa, MD;  Location: [facility];  Service: General;  Laterality: N/A;   COLONOSCOPY     HAMMER TOE SURGERY     ORIF ANKLE FRACTURE Left 11/17/2014   Procedure: OPEN REDUCTION INTERNAL FIXATION (ORIF) LEFT TRIMALLEOLAR ANKLE FRACTURE;  Surgeon: Marianna Payment, MD;  Location: [facility];  Service: Orthopedics;  Laterality: Left;   OVARIAN CYST REMOVAL      Social History   Tobacco Use   Smoking status: Never   Smokeless tobacco: Never  Vaping Use   Vaping Use: Never used  Substance Use Topics   Alcohol use: Yes    Alcohol/week: 1.0 standard drink of alcohol    Types: 1 Glasses of wine per week   Drug use:  No    Family History  Problem Relation Age of Onset   Diabetes Mother    Hyperlipidemia Mother    Diabetes Father    Heart disease Father 68       AMI x 2; first AMI age 42   Stroke Father    Hypertension Father    Hyperlipidemia Brother    AAA (abdominal aortic aneurysm) Brother    Congestive Heart Failure Brother        s/p ICD   Coronary artery disease Brother        defibrillator; CHF   Kidney cancer Brother        Status post nephrectomy   Other Sister 25       Died in a car accident    Allergies  Allergen Reactions   Pneumovax 23 [Pneumococcal Vac Polyvalent] Other (See Comments)    Pt had severe arm pain   Amaryl  [Glimepiride] Nausea Only   Cephalexin Hives   Sulfa Antibiotics Rash    Medication list has been reviewed and updated.  Current Outpatient Medications on File Prior to Visit  Medication Sig Dispense Refill   Ascorbic Acid (VITAMIN C) 1000 MG tablet Take 1,000 mg by mouth daily.     atorvastatin (LIPITOR) 20 MG tablet TAKE ONE TABLET BY MOUTH ONCE DAILY 90 tablet 0   B Complex-C (SUPER B COMPLEX PO) Take 1 tablet by mouth daily.     blood glucose meter kit and supplies Dispense based on patient and insurance preference. Use up to four times daily as directed. (FOR ICD-10 E10.9, E11.9). 1 each 0   Blood Glucose Monitoring Suppl (ONE TOUCH ULTRA 2) w/Device KIT by Does not apply route.     calcium carbonate (TUMS - DOSED IN MG ELEMENTAL CALCIUM) 500 MG chewable tablet Chew 1 tablet by mouth daily.     Cholecalciferol (VITAMIN D3) 50 MCG (2000 UT) CAPS Take 1 capsule by mouth daily.     FARXIGA 10 MG TABS tablet Take 1 tablet (10 mg total) by mouth daily. For [place] and [place] 90 tablet 3   fluconazole (DIFLUCAN) 150 MG tablet Take 1 tab, repeat in 72 hours if no improvement. 2 tablet 0   FLUoxetine (PROZAC) 40 MG capsule Take 1 capsule (40 mg total) by mouth daily. 90 capsule 3   fluticasone (FLONASE) 50 MCG/ACT nasal spray Place 2 sprays into both nostrils daily. 16 g 1   glucose blood test strip 1 each by Other route as needed for other. Use as instructed 100 each 3   meclizine (ANTIVERT) 25 MG tablet Take 1 tablet (25 mg total) by mouth 3 (three) times daily as needed for dizziness. 30 tablet 0   Multiple Vitamins-Minerals (ALIVE WOMENS 50+) TABS Take 1 tablet by mouth daily.     nitroGLYCERIN (NITROSTAT) 0.4 MG SL tablet PLACE 1 TABLET UNDER THE TONGUE EVERY 5 MINUTES AS NEEDED FOR CHEST PAIN 25 tablet 2   omeprazole (PRILOSEC) 20 MG capsule TAKE ONE CAPSULE BY MOUTH ONCE DAILY 90 capsule 3   OneTouch Delica Lancets 99991111 MISC Use to check glucose up to 4 times daily. Dx Code  E11.9 400 each 1   promethazine-dextromethorphan (PROMETHAZINE-DM) 6.25-15 MG/5ML syrup Take 5 mLs by mouth 4 (four) times daily as needed for cough. 118 mL 0   propranolol (INDERAL) 10 MG tablet TAKE ONE TABLET BY MOUTH EVERY EIGHT TO 12 HOURS AS NEEDED FOR tremors 270 tablet 0   Semaglutide, 2 MG/DOSE, (OZEMPIC, 2 MG/DOSE,)  8 MG/3ML SOPN Inject 2 mg into the skin once a week. 3 mL 3   traZODone (DESYREL) 150 MG tablet TAKE ONE TABLET BY MOUTH EVERYDAY AT BEDTIME 90 tablet 0   zinc gluconate 50 MG tablet Take 50 mg by mouth daily.     No current facility-administered medications on file prior to visit.    Review of Systems:  As per HPI- otherwise negative.   Physical Examination: Vitals:   12/15/22 1302  BP: 128/70  Pulse: 90  Resp: 18  Temp: 97.9 F (36.6 C)  SpO2: 96%   Vitals:   12/15/22 1302  Weight: 145 lb 6.4 oz (66 kg)  Height: 5' 5"$  (1.651 m)   Body mass index is 24.2 kg/m. Ideal Body Weight: Weight in (lb) to have BMI = 25: 149.9  GEN: no acute distress. Normal weight, looks well  HEENT: Atraumatic, Normocephalic.  Bilateral TM wnl, oropharynx normal.  PEERL,EOMI.   Ears and Nose: No external deformity. CV: RRR, No M/G/R. No JVD. No thrill. No extra heart sounds. PULM: CTA B, no wheezes, crackles, rhonchi. No retractions. No resp. distress. No accessory muscle use. ABD: S, NT, ND, +BS. No rebound. No HSM. EXTR: No c/c/e PSYCH: Normally interactive. Conversant.  Foot exam is normal today  Romberg normal Normal strength, sensation and DTR of all extremities Normal facial movement  Assessment and Plan: Physical exam  Hyperlipidemia associated with type 2 diabetes mellitus ([place]) - Plan: Lipid panel  Benign essential HTN - Plan: Comprehensive metabolic panel, CBC, TSH  Type 2 diabetes mellitus without complication, without long-term current use of insulin ([facility]) - Plan: Hemoglobin A1c, Microalbumin / creatinine urine ratio  Tremor - Plan: Ambulatory referral  to Neurology  Chronic pain of both knees - Plan: DG Knee Complete 4 Views Right, DG Knee Complete 4 Views Left  Physical exam today.  Encouraged healthy diet and exercise routine Will plan further follow- up pending labs. She has noted bilateral knee pain for a few months- will obtain x-rays of her knees  Referral to neurology to discuss your tremor Please go to lab and then to the ground floor to have x-rays of your knees Flu shot today If the propranolol is not helping with your tremor you do not have to continue it- taper off by taking a 1/2 tablet daily for 3-4 days, then you can stop   Signed Lamar Blinks, MD Addendum 2/9 Received labs as below, message to pt Results for orders placed or performed in visit on 12/15/22  Comprehensive metabolic panel  Result Value Ref Range   Sodium 137 135 - 145 mEq/L   Potassium 3.9 3.5 - 5.1 mEq/L   Chloride 97 96 - 112 mEq/L   CO2 28 19 - 32 mEq/L   Glucose, Bld 109 (H) 70 - 99 mg/dL   BUN 18 6 - 23 mg/dL   Creatinine, Ser 1.46 (H) 0.40 - 1.20 mg/dL   Total Bilirubin 0.4 0.2 - 1.2 mg/dL   Alkaline Phosphatase 72 39 - 117 U/L   AST 15 0 - 37 U/L   ALT 16 0 - 35 U/L   Total Protein 6.2 6.0 - 8.3 g/dL   Albumin 4.0 3.5 - 5.2 g/dL   GFR 35.55 (L) >60.00 mL/min   Calcium 8.8 8.4 - 10.5 mg/dL  CBC  Result Value Ref Range   WBC 7.5 4.0 - 10.5 K/uL   RBC 4.34 3.87 - 5.11 Mil/uL   Platelets 193.0 150.0 - 400.0 K/uL   Hemoglobin  12.5 12.0 - 15.0 g/dL   HCT 37.2 36.0 - 46.0 %   MCV 85.7 78.0 - 100.0 fl   MCHC 33.5 30.0 - 36.0 g/dL   RDW 13.9 11.5 - 15.5 %  Hemoglobin A1c  Result Value Ref Range   Hgb A1c MFr Bld 7.6 (H) 4.6 - 6.5 %  Lipid panel  Result Value Ref Range   Cholesterol 172 0 - 200 mg/dL   Triglycerides 109.0 0.0 - 149.0 mg/dL   HDL 65.20 >39.00 mg/dL   VLDL 21.8 0.0 - 40.0 mg/dL   LDL Cholesterol 85 0 - 99 mg/dL   Total CHOL/HDL Ratio 3    NonHDL 107.22   TSH  Result Value Ref Range   TSH 1.03 0.35 - 5.50 uIU/mL   Microalbumin / creatinine urine ratio  Result Value Ref Range   Microalb, Ur 3.8 (H) 0.0 - 1.9 mg/dL   Creatinine,U 254.2 mg/dL   Microalb Creat Ratio 1.5 0.0 - 30.0 mg/g

## 2022-12-12 NOTE — Patient Instructions (Incomplete)
It was great to see you again today!  I will be in touch with your labs Recommend latest COVID booster, dose of RSV if not done already  [facility] to taper off the propranolol if not helping with your tremor- take a 1/2 tablet daily for 4-5 days, then stop Referral to neurology to discuss your tremor Flu shot today  Please let me know if your mood is not getting back to normal soon- Sooner if worse.

## 2022-12-15 ENCOUNTER — Ambulatory Visit (HOSPITAL_BASED_OUTPATIENT_CLINIC_OR_DEPARTMENT_OTHER)
Admission: RE | Admit: 2022-12-15 | Discharge: 2022-12-15 | Disposition: A | Discharge status: Home / Self Care | Payer: PPO | Source: Ambulatory Visit | Attending: Family Medicine | Admitting: Family Medicine

## 2022-12-15 ENCOUNTER — Ambulatory Visit (INDEPENDENT_AMBULATORY_CARE_PROVIDER_SITE_OTHER): Payer: PPO | Admitting: Family Medicine

## 2022-12-15 ENCOUNTER — Encounter: Payer: Self-pay | Admitting: Family Medicine

## 2022-12-15 VITALS — BP 128/70 | HR 90 | Temp 97.9°F | Resp 18 | Ht 65.0 in | Wt 145.4 lb

## 2022-12-15 DIAGNOSIS — Z0000 Encounter for general adult medical examination without abnormal findings: Secondary | ICD-10-CM | POA: Diagnosis not present

## 2022-12-15 DIAGNOSIS — E1169 Type 2 diabetes mellitus with other specified complication: Secondary | ICD-10-CM

## 2022-12-15 DIAGNOSIS — M25562 Pain in left knee: Secondary | ICD-10-CM | POA: Diagnosis not present

## 2022-12-15 DIAGNOSIS — M25561 Pain in right knee: Secondary | ICD-10-CM

## 2022-12-15 DIAGNOSIS — I10 Essential (primary) hypertension: Secondary | ICD-10-CM | POA: Diagnosis not present

## 2022-12-15 DIAGNOSIS — E785 Hyperlipidemia, unspecified: Secondary | ICD-10-CM | POA: Diagnosis not present

## 2022-12-15 DIAGNOSIS — G8929 Other chronic pain: Secondary | ICD-10-CM | POA: Insufficient documentation

## 2022-12-15 DIAGNOSIS — E119 Type 2 diabetes mellitus without complications: Secondary | ICD-10-CM | POA: Diagnosis not present

## 2022-12-15 DIAGNOSIS — R251 Tremor, unspecified: Secondary | ICD-10-CM

## 2022-12-15 DIAGNOSIS — M1712 Unilateral primary osteoarthritis, left knee: Secondary | ICD-10-CM | POA: Diagnosis not present

## 2022-12-15 LAB — MICROALBUMIN / CREATININE URINE RATIO
Creatinine,U: 254.2 mg/dL
Microalb Creat Ratio: 1.5 mg/g (ref 0.0–30.0)
Microalb, Ur: 3.8 mg/dL — ABNORMAL HIGH (ref 0.0–1.9)

## 2022-12-15 NOTE — Telephone Encounter (Signed)
Received notification from [place] regarding approval for [facility]. Patient assistance approved from 11/07/2022 to 11/07/2023. SCANNED IN MEDIA (APPROVAL LETTER)   Phone: [facility] Patient Advocate

## 2022-12-16 ENCOUNTER — Encounter: Payer: Self-pay | Admitting: Family Medicine

## 2022-12-16 LAB — COMPREHENSIVE METABOLIC PANEL
ALT: 16 U/L (ref 0–35)
AST: 15 U/L (ref 0–37)
Albumin: 4 g/dL (ref 3.5–5.2)
Alkaline Phosphatase: 72 U/L (ref 39–117)
BUN: 18 mg/dL (ref 6–23)
CO2: 28 mEq/L (ref 19–32)
Calcium: 8.8 mg/dL (ref 8.4–10.5)
Chloride: 97 mEq/L (ref 96–112)
Creatinine, Ser: 1.46 mg/dL — ABNORMAL HIGH (ref 0.40–1.20)
GFR: 35.55 mL/min — ABNORMAL LOW (ref >60.00)
Glucose, Bld: 109 mg/dL — ABNORMAL HIGH (ref 70–99)
Potassium: 3.9 mEq/L (ref 3.5–5.1)
Sodium: 137 mEq/L (ref 135–145)
Total Bilirubin: 0.4 mg/dL (ref 0.2–1.2)
Total Protein: 6.2 g/dL (ref 6.0–8.3)

## 2022-12-16 LAB — LIPID PANEL
Cholesterol: 172 mg/dL (ref 0–200)
HDL: 65.2 mg/dL (ref >39.00)
LDL Cholesterol: 85 mg/dL (ref 0–99)
NonHDL: 107.22
Total CHOL/HDL Ratio: 3
Triglycerides: 109 mg/dL (ref 0.0–149.0)
VLDL: 21.8 mg/dL (ref 0.0–40.0)

## 2022-12-16 LAB — CBC
HCT: 37.2 % (ref 36.0–46.0)
Hemoglobin: 12.5 g/dL (ref 12.0–15.0)
MCHC: 33.5 g/dL (ref 30.0–36.0)
MCV: 85.7 fl (ref 78.0–100.0)
Platelets: 193 10*3/uL (ref 150.0–400.0)
RBC: 4.34 Mil/uL (ref 3.87–5.11)
RDW: 13.9 % (ref 11.5–15.5)
WBC: 7.5 10*3/uL (ref 4.0–10.5)

## 2022-12-16 LAB — TSH: TSH: 1.03 u[IU]/mL (ref 0.35–5.50)

## 2022-12-16 LAB — HEMOGLOBIN A1C: Hgb A1c MFr Bld: 7.6 % — ABNORMAL HIGH (ref 4.6–6.5)

## 2022-12-17 ENCOUNTER — Encounter: Payer: Self-pay | Admitting: Family Medicine

## 2022-12-29 ENCOUNTER — Telehealth: Payer: Self-pay

## 2022-12-29 NOTE — Telephone Encounter (Signed)
Pt made aware that Ozempic has arrived. Medication placed in the back frig.

## 2023-01-27 ENCOUNTER — Other Ambulatory Visit: Payer: Self-pay | Admitting: Family Medicine

## 2023-01-27 DIAGNOSIS — E785 Hyperlipidemia, unspecified: Secondary | ICD-10-CM

## 2023-01-27 DIAGNOSIS — F329 Major depressive disorder, single episode, unspecified: Secondary | ICD-10-CM

## 2023-01-27 DIAGNOSIS — G250 Essential tremor: Secondary | ICD-10-CM

## 2023-01-28 ENCOUNTER — Other Ambulatory Visit: Payer: Self-pay | Admitting: Family Medicine

## 2023-01-28 DIAGNOSIS — K219 Gastro-esophageal reflux disease without esophagitis: Secondary | ICD-10-CM

## 2023-01-31 ENCOUNTER — Encounter: Payer: Self-pay | Admitting: Neurology

## 2023-01-31 ENCOUNTER — Ambulatory Visit: Payer: PPO | Admitting: Neurology

## 2023-01-31 VITALS — BP 135/79 | HR 107 | Ht 65.0 in | Wt 144.0 lb

## 2023-01-31 DIAGNOSIS — G250 Essential tremor: Secondary | ICD-10-CM

## 2023-01-31 MED ORDER — PROPRANOLOL HCL ER 60 MG PO CP24: 60.0000 mg | ORAL_CAPSULE | Freq: Every day | ORAL | 5 refills | Status: DC

## 2023-01-31 NOTE — Patient Instructions (Signed)
You likely have essential tremor.  I do not see any signs or symptoms of parkinson's like disease or what we call parkinsonism.  For your tremor, I would not recommend any new medications at this time.  I would like for you to restart Inderal (generic name: propranolol) 10 mg strength: take 1 pill twice daily for 1 week, then 2 pills twice daily for 1 week.   Then you can start the new propranolol 60 mg long-acting, take 1 pill once daily and stay on that thereafter.   Common side effect reported are: lethargy, sedation, low blood pressure and low pulse rate. Please monitor your BP and Pulse every few days, if your pulse drops lower than 55 you may feel bad and we may have to adjust your dose. Same with your BP below 110/55.   We can see you in 4-6 mo with the nurse practitioner.   I am reluctant to consider another medication for tremor, called primidone, due to the risk of dizziness with it and due to your impaired kidney function.   Please try to hydrate better with water, 3-4 bottles of water are recommended, 16.9 oz size each.   Please remember, that any kind of tremor may be exacerbated by anxiety, anger, nervousness, excitement, dehydration, sleep deprivation, thyroid dysfunction, by caffeine, and low blood sugar values or blood sugar fluctuations. Some medications can exacerbate tremors, this includes certain asthma or COPD medications and certain antidepressants.

## 2023-01-31 NOTE — Progress Notes (Signed)
Subjective:    Patient ID: Abigail [name] is a 72 y.o. female.  HPI    Star Age, MD, PhD [facility] [address] [place], [place] [address]  Dear Dr. Lorelei Pont,  I saw your patient, Abigail [name], upon your kind request in my neurologic clinic today for initial consultation of her tremors.  The patient is unaccompanied today.  As you know, Abigail [name] is a 72 year old female with an underlying medical history of diabetes, hypertension, hyperlipidemia, mitral valve regurgitation, skin cancer, anemia, anxiety, depression, and chronic kidney disease, who reports an approximately 10 year Hx of bilateral hand tremors.  Her right hand seems to be worse than the left.  She is right-handed.  She noticed the tremor more so when she was still working as she needed to use the computer a lot and she would make mistakes typing.  It has affected her handwriting and her fine motor skills.  She has recently tapered off propranolol, she did not find it effective, she admits that she only took a low-dose, she took up to 20 mg once daily, usually only 10 mg once daily.  She has not had much in the way of side effects from propranolol, she has been off of it for a few weeks. Stress can be a trigger she admits.  She does not sleep well, she has been on trazodone for many years, essentially since she lost her sister and brother-in-law in an accident.  She had to increase the trazodone dose over time.  She lost her husband about 3-1/2 years ago, she lost her mom recently in November 2023.  She recalls that her mom had a tremor in her hands but passed away from complications of congestive heart failure.  She has 1 son and 1 daughter, neither 1 has tremors, both have 2 kids each.  Her son lives in town, her daughter lives at [employer] and is planning to visit her for a week next week.   She is a non-smoker, she does not currently drink any alcohol. She does not drink  caffeine daily.  She admits that she does not drink much in the way of water, she drinks altogether probably 32 ounces of water per day.  I reviewed your office note from 12/15/2022.  She had blood work through your office at the time, including TSH, lipid panel, A1c, CBC, and CMP.  I reviewed the results: Labs were benign or stable, TSH was 1.03, CMP showed blood sugar of 109, creatinine elevated at 1.46, CBC with platelets without differential benign, but A1c was elevated at 7.6 which has been increasing gradually since essentially September 2022.  Lipid panel showed total cholesterol of 172, triglycerides 109, HDL 65, LDL 85.    She has been on Prozac for years, she is currently on 40 mg daily. Of note, she had a head CT without contrast through [facility] health emergency room department at [place] on 08/03/2022 with indication of headache and dizziness, I reviewed the results: Impression: No acute intracranial abnormalities.  Normal brain.  She had a bout of vertigo in October 2023.  She reports that symptoms lasted for about a week and she used meclizine at the time.  She recalls that she was sick at the time with a acute illness.  She has not had any recurrence.  Her Past Medical History Is Significant For: Past Medical History:  Diagnosis Date   Anemia, unspecified    Anxiety    CKD (  chronic kidney disease), stage III ([place])    Depression    Diabetes mellitus type II, non insulin dependent ([place])    On Ozempic and Farxiga   GERD (gastroesophageal reflux disease)    Hematuria    Hyperlipidemia    Hypertension    Mild mitral regurgitation 08/2017   Essentially normal-mild MR on echo.   Normal coronary arteries    Coronary CTA: No evidence of CAD.  Coronary calcium score 0.   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, [facility]    Her Past Surgical History Is Significant For: Past Surgical History:  Procedure Laterality Date   CHOLECYSTECTOMY N/A 06/03/2015   Procedure:  LAPAROSCOPIC CHOLECYSTECTOMY WITH INTRAOPERATIVE CHOLANGIOGRAM;  Surgeon: Donnie Mesa, MD;  Location: [facility];  Service: General;  Laterality: N/A;   COLONOSCOPY     GUM SURGERY  2024   gum transplant for recession   HAMMER TOE SURGERY     ORIF ANKLE FRACTURE Left 11/17/2014   Procedure: OPEN REDUCTION INTERNAL FIXATION (ORIF) LEFT TRIMALLEOLAR ANKLE FRACTURE;  Surgeon: Marianna Payment, MD;  Location: [facility];  Service: Orthopedics;  Laterality: Left;   OVARIAN CYST REMOVAL      Her Family History Is Significant For: Family History  Problem Relation Age of Onset   Tremor Mother    Diabetes Mother    Hyperlipidemia Mother    Diabetes Father    Heart disease Father 92       AMI x 2; first AMI age 75   Stroke Father    Hypertension Father    Other Sister 22       Died in a car accident   Hyperlipidemia Brother    AAA (abdominal aortic aneurysm) Brother    Congestive Heart Failure Brother        s/p ICD   Coronary artery disease Brother        defibrillator; CHF   Kidney cancer Brother        Status post nephrectomy    Her Social History Is Significant For: Social History   Socioeconomic History   Marital status: Widowed    Spouse name: Not on file   Number of children: 2   Years of education: Not on file   Highest education level: High school graduate  Occupational History   Occupation: Buyer, retail    Comment: Retired-Worked at [employer] Use   Smoking status: Never   Smokeless tobacco: Never  Vaping Use   Vaping Use: Never used  Substance and Sexual Activity   Alcohol use: Yes    Alcohol/week: 1.0 standard drink of alcohol    Types: 1 Glasses of wine per week   Drug use: No   Sexual activity: Yes    Birth control/protection: None  Other Topics Concern   Not on file  Social History Narrative   Marital status: married x 1970. ->  Now widowed      Children:  2 children; 7 grandchildren.      Employment:  [employer].  ->  Retired      Tobacco: none       Alcohol: sporadic       Exercises: walking 7 days per week.  20-30 minutes.  Walks her dog.   Social Determinants of Health   Financial Resource Strain: Medium Risk (11/12/2021)   Overall Financial Resource Strain (CARDIA)    Difficulty of Paying Living Expenses: Somewhat hard  Food Insecurity: No Food Insecurity (05/04/2020)   Hunger Vital  Sign    Worried About Charity fundraiser in the Last Year: Never true    [place] in the Last Year: Never true  Transportation Needs: No Transportation Needs (09/07/2021)   PRAPARE - Hydrologist (Medical): No    Lack of Transportation (Non-Medical): No  Physical Activity: Sufficiently Active (05/05/2021)   Exercise Vital Sign    Days of Exercise per Week: 7 days    Minutes of Exercise per Session: 40 min  Stress: No Stress Concern Present (05/05/2021)   [place]    Feeling of Stress : Only a little  Social Connections: Moderately Isolated (05/05/2021)   Social Connection and Isolation Panel [NHANES]    Frequency of Communication with Friends and Family: More than three times a week    Frequency of Social Gatherings with Friends and Family: More than three times a week    Attends Religious Services: More than 4 times per year    Active Member of [employer] or Organizations: No    Attends Archivist Meetings: Never    Marital Status: Widowed    Her Allergies Are:  Allergies  Allergen Reactions   Pneumovax 23 [Pneumococcal Vac Polyvalent] Other (See Comments)    Pt had severe arm pain   Amaryl [Glimepiride] Nausea Only   Cephalexin Hives   Sulfa Antibiotics Rash  :   Her Current Medications Are:  Outpatient Encounter Medications as of 01/31/2023  Medication Sig   Ascorbic Acid (VITAMIN C) 1000 MG tablet Take 1,000 mg by mouth daily.   atorvastatin (LIPITOR) 20 MG tablet Take 1 tablet (20 mg total)  by mouth daily.   B Complex-C (SUPER B COMPLEX PO) Take 1 tablet by mouth daily.   blood glucose meter kit and supplies Dispense based on patient and insurance preference. Use up to four times daily as directed. (FOR ICD-10 E10.9, E11.9).   Blood Glucose Monitoring Suppl (ONE TOUCH ULTRA 2) w/Device KIT by Does not apply route.   calcium carbonate (TUMS - DOSED IN MG ELEMENTAL CALCIUM) 500 MG chewable tablet Chew 1 tablet by mouth daily.   Cholecalciferol (VITAMIN D3) 50 MCG (2000 UT) CAPS Take 1 capsule by mouth daily.   FARXIGA 10 MG TABS tablet Take 1 tablet (10 mg total) by mouth daily. For [place] and Me assistance program   FLUoxetine (PROZAC) 40 MG capsule Take 1 capsule (40 mg total) by mouth daily.   fluticasone (FLONASE) 50 MCG/ACT nasal spray Place 2 sprays into both nostrils daily.   glucose blood test strip 1 each by Other route as needed for other. Use as instructed   Multiple Vitamins-Minerals (ALIVE WOMENS 50+) TABS Take 1 tablet by mouth daily.   nitroGLYCERIN (NITROSTAT) 0.4 MG SL tablet PLACE 1 TABLET UNDER THE TONGUE EVERY 5 MINUTES AS NEEDED FOR CHEST PAIN   omeprazole (PRILOSEC) 20 MG capsule Take 1 capsule (20 mg total) by mouth daily.   OneTouch Delica Lancets 99991111 MISC Use to check glucose up to 4 times daily. Dx Code E11.9   Semaglutide, 2 MG/DOSE, (OZEMPIC, 2 MG/DOSE,) 8 MG/3ML SOPN Inject 2 mg into the skin once a week.   traZODone (DESYREL) 150 MG tablet Take 1 tablet (150 mg total) by mouth at bedtime.   zinc gluconate 50 MG tablet Take 50 mg by mouth daily.   meclizine (ANTIVERT) 25 MG tablet Take 1 tablet (25 mg total) by mouth 3 (three) times daily  as needed for dizziness. (Patient not taking: Reported on 01/31/2023)   propranolol (INDERAL) 10 MG tablet TAKE ONE TABLET BY MOUTH EVERY EIGHT TO TWELVE HOURS AS NEEDED FOR tremors (Patient not taking: Reported on 01/31/2023)   [DISCONTINUED] fluconazole (DIFLUCAN) 150 MG tablet Take 1 tab, repeat in 72 hours if no improvement.  (Patient not taking: Reported on 01/31/2023)   [DISCONTINUED] promethazine-dextromethorphan (PROMETHAZINE-DM) 6.25-15 MG/5ML syrup Take 5 mLs by mouth 4 (four) times daily as needed for cough. (Patient not taking: Reported on 01/31/2023)   No facility-administered encounter medications on file as of 01/31/2023.  :   Review of Systems:  Out of a complete 14 point review of systems, all are reviewed and negative with the exception of these symptoms as listed below:  Review of Systems  Neurological:        Patient is here alone to be evaluated for tremor and vertigo. Patient states the tremor is more bothersome to her. She notes vertigo to be a brief episode twice when she was sick, most recently around September/October 2023. She does have Meclizine to take as needed however. Regarding tremors, they are in both hands but right hand is worse. She has difficult writing at times. She tried Propranolol for 2 years but did not find it beneficial so it was discontinued and she was referred to neurology for further evaluation and management.     Objective:  Neurological Exam  Physical Exam Physical Examination:   Vitals:   01/31/23 1524  BP: 135/79  Pulse: (!) 107    General Examination: The patient is a very pleasant 72 y.o. female in no acute distress. She appears well-developed and well-nourished and well groomed.   HEENT: Normocephalic, atraumatic, pupils are equal, round and reactive to light, extraocular tracking is good without limitation to gaze excursion or nystagmus noted.  Corrective eyeglasses in place.  She has an intermittent lower jaw and lip tremor.  She has no voice tremor.  Hearing is grossly intact.  Face is symmetric with normal facial animation, neck is supple with full range of motion, no carotid bruits.  Airway examination reveals moderate mouth dryness, otherwise benign findings, tongue protrudes centrally and palate elevates symmetrically.  No head tremor.  Chest: Clear to  auscultation without wheezing, rhonchi or crackles noted.  Heart: S1+S2+0, regular and normal without murmurs, rubs or gallops noted.   Abdomen: Soft, non-tender and non-distended.  Extremities: There is no pitting edema in the distal lower extremities bilaterally.   Skin: Warm and dry without trophic changes noted.   Musculoskeletal: exam reveals no obvious joint deformities.   Neurologically:  Mental status: The patient is awake, alert and oriented in all 4 spheres. Her immediate and remote memory, attention, language skills and fund of knowledge are appropriate. There is no evidence of aphasia, agnosia, apraxia or anomia. Speech is clear with normal prosody and enunciation. Thought process is linear. Mood is normal and affect is normal.  Cranial nerves II - XII are as described above under HEENT exam.  Motor exam: Normal bulk, strength and tone is noted. There is no obvious resting tremor. She has a mild to moderate postural tremor in both upper extremities, mild action tremor in both upper extremities, no intention tremor.  Handwriting is tremulous but legible, not micrographic.    On 01/31/2023: On Archimedes spiral drawing she has coarse trembling with the left hand, mild to moderate trembling with the right hand.  Fine motor skills and coordination: Intact finger taps, hand movements and rapid  alternating patting bilaterally, no decrement in amplitude but slightly slow and deliberate with her movements.  Foot taps bilaterally unremarkable.    Cerebellar testing: No dysmetria or intention tremor. There is no truncal or gait ataxia.  Normal finger-to-nose, normal heel-to-shin bilaterally. Sensory exam: intact to light touch in the upper and lower extremities.  Gait, station and balance: She stands easily. No veering to one side is noted. No leaning to one side is noted. Posture is age-appropriate and stance is narrow based. Gait shows normal stride length and normal pace. No problems  turning are noted.  No walking aid.  Assessment and plan:  In summary, Abigail [name] is a very pleasant 72 y.o.-year old female with an underlying medical history of diabetes, hypertension, hyperlipidemia, mitral valve regurgitation, skin cancer, anemia, anxiety, depression, and chronic kidney disease, who presents for evaluation of her tremor disorder of at least 10 years duration.  History and examination are in keeping with essential tremor, she also reports a family history of tremor affecting her mom who recently passed away.  She is reassured that there are no signs of parkinsonism on exam.  We talked about tremor triggers quite a bit and alleviating factors.  I think contributing to her tremors are stress and suboptimal hydration.  We talked about treatment options, first-line treatment options include beta-blocker treatment or the consideration of primidone.  I do not believe we should add primidone at this time or trial it because of her impaired kidney function and her history of vertigo, primidone can exacerbate dizziness and balance issues in susceptible patients.  I would rather she try to hydrate better and restart taking the beta-blocker at 10 mg twice daily and then increase after a week propranolol to 20 mg twice daily.  She is advised to then switch to 60 mg long-acting propranolol, take 1 pill daily. We talked about side effects and expectations of this medication.  She is advised that beta-blocker medications can exacerbate depression and also lower heart rate and blood pressure.  When she came off of it she did not have any repercussions, she did not have any issues with worsening depression while she was on the propranolol in the recent past.  She was given detailed instructions in her MyChart after visit summary and also verbally.  She is advised to follow-up routinely in our clinic to see one of our nurse practitioners in 4 to 6 months, sooner if needed.  She is encouraged to keep [place]  posted in the interim by phone call or through [employer].  I answered all her questions today and she was in agreement with our plan.  Thank you very much for allowing me to participate in the care of this nice patient. If I can be of any further assistance to you please do not hesitate to call me at [phone].  Sincerely,   Star Age, MD, PhD

## 2023-03-02 LAB — HM DIABETES EYE EXAM

## 2023-04-06 ENCOUNTER — Telehealth: Payer: Self-pay | Admitting: *Deleted

## 2023-04-06 NOTE — Telephone Encounter (Signed)
Left detailed message that Ozempic 2mg  is here and ready for pickup.  Medication is fridge at nurses station.

## 2023-04-06 NOTE — Telephone Encounter (Signed)
RECEIVED A LETTER  REGARDING [place]&ME FOR (FARXIGA) ABOUT NEXT SHIPMENT  LETTER IN MEDIA OR CHART   Melanee Spry CPhT [facility] [phone]) [phone]

## 2023-04-06 NOTE — Telephone Encounter (Signed)
Patient called and stated she will come pick it up next week.

## 2023-04-16 ENCOUNTER — Other Ambulatory Visit: Payer: Self-pay | Admitting: Family Medicine

## 2023-04-16 DIAGNOSIS — K219 Gastro-esophageal reflux disease without esophagitis: Secondary | ICD-10-CM

## 2023-04-17 MED ORDER — OMEPRAZOLE 20 MG PO CPDR: 20.0000 mg | DELAYED_RELEASE_CAPSULE | Freq: Every day | ORAL | 0 refills | Status: DC

## 2023-04-18 ENCOUNTER — Encounter: Payer: Self-pay | Admitting: Family Medicine

## 2023-04-18 ENCOUNTER — Telehealth: Payer: Self-pay

## 2023-04-18 NOTE — Telephone Encounter (Signed)
Called received from pharmacy about Omeprazole 20 mg directions on rx 1 daily #90 Per pharmacist patient reports she is taking 1 to 3 times a day "depending on how bad acid reflux is" Pharmacy asking for directions and quantity change if indicated.

## 2023-04-22 ENCOUNTER — Other Ambulatory Visit: Payer: Self-pay | Admitting: Family Medicine

## 2023-04-22 DIAGNOSIS — I10 Essential (primary) hypertension: Secondary | ICD-10-CM

## 2023-04-22 DIAGNOSIS — F4323 Adjustment disorder with mixed anxiety and depressed mood: Secondary | ICD-10-CM

## 2023-04-25 ENCOUNTER — Other Ambulatory Visit: Payer: Self-pay | Admitting: Family Medicine

## 2023-04-25 DIAGNOSIS — I10 Essential (primary) hypertension: Secondary | ICD-10-CM

## 2023-04-25 DIAGNOSIS — F329 Major depressive disorder, single episode, unspecified: Secondary | ICD-10-CM

## 2023-04-25 DIAGNOSIS — E785 Hyperlipidemia, unspecified: Secondary | ICD-10-CM

## 2023-05-02 ENCOUNTER — Other Ambulatory Visit: Payer: Self-pay | Admitting: Family Medicine

## 2023-05-02 DIAGNOSIS — I10 Essential (primary) hypertension: Secondary | ICD-10-CM

## 2023-05-17 ENCOUNTER — Ambulatory Visit (INDEPENDENT_AMBULATORY_CARE_PROVIDER_SITE_OTHER): Payer: PPO | Admitting: *Deleted

## 2023-05-17 DIAGNOSIS — Z0000 Encounter for general adult medical examination without abnormal findings: Secondary | ICD-10-CM | POA: Diagnosis not present

## 2023-05-17 DIAGNOSIS — E119 Type 2 diabetes mellitus without complications: Secondary | ICD-10-CM | POA: Diagnosis not present

## 2023-05-17 MED ORDER — GLUCOSE BLOOD VI STRP: 1.0000 | ORAL_STRIP | 3 refills | Status: DC | PRN

## 2023-05-17 NOTE — Progress Notes (Signed)
Subjective:   Abigail [name] is a 72 y.o. female who presents for Medicare Annual (Subsequent) preventive examination.  Visit Complete: Virtual  I connected with  Abigail [name] on 05/17/23 by a audio enabled telemedicine application and verified that I am speaking with the correct person using two identifiers.  Patient Location: Home  Provider Location: Office/Clinic  I discussed the limitations of evaluation and management by telemedicine. The patient expressed understanding and agreed to proceed.   Review of Systems     Cardiac Risk Factors include: advanced age (>60men, >97 women);diabetes mellitus;dyslipidemia;hypertension     Objective:    Today's Vitals   There is no height or weight on file to calculate BMI.     05/17/2023    3:15 PM 08/31/2022    3:41 PM 08/03/2022   12:17 PM 05/11/2022    9:47 AM 05/05/2021    9:41 AM 12/18/2020   10:11 AM 05/04/2020    2:45 PM  Advanced Directives  Does Patient Have a Medical Advance Directive? Yes Yes No No No No Yes  Type of Advance Directive Living will Living will;Healthcare Power of Attorney     Living will  Does patient want to make changes to medical advance directive?       No - Patient declined  Would patient like information on creating a medical advance directive?    No - Patient declined Yes (MAU/Ambulatory/Procedural Areas - Information given) No - Patient declined     Current Medications (verified) Outpatient Encounter Medications as of 05/17/2023  Medication Sig   Ascorbic Acid (VITAMIN C) 1000 MG tablet Take 1,000 mg by mouth daily.   atorvastatin (LIPITOR) 20 MG tablet Take 1 tablet (20 mg total) by mouth daily.   B Complex-C (SUPER B COMPLEX PO) Take 1 tablet by mouth daily.   blood glucose meter kit and supplies Dispense based on patient and insurance preference. Use up to four times daily as directed. (FOR ICD-10 E10.9, E11.9).   Blood Glucose Monitoring Suppl (ONE TOUCH ULTRA 2) w/Device KIT by Does not  apply route.   calcium carbonate (TUMS - DOSED IN MG ELEMENTAL CALCIUM) 500 MG chewable tablet Chew 1 tablet by mouth daily.   Cholecalciferol (VITAMIN D3) 50 MCG (2000 UT) CAPS Take 1 capsule by mouth daily.   FARXIGA 10 MG TABS tablet Take 1 tablet (10 mg total) by mouth daily. For [place] and Me assistance program   FLUoxetine (PROZAC) 40 MG capsule Take 1 capsule (40 mg total) by mouth daily.   fluticasone (FLONASE) 50 MCG/ACT nasal spray Place 2 sprays into both nostrils daily.   meclizine (ANTIVERT) 25 MG tablet Take 1 tablet (25 mg total) by mouth 3 (three) times daily as needed for dizziness.   Multiple Vitamins-Minerals (ALIVE WOMENS 50+) TABS Take 1 tablet by mouth daily.   nitroGLYCERIN (NITROSTAT) 0.4 MG SL tablet PLACE 1 TABLET UNDER THE TONGUE EVERY 5 MINUTES AS NEEDED FOR CHEST PAIN   omeprazole (PRILOSEC) 20 MG capsule Take 1 capsule (20 mg total) by mouth daily.   OneTouch Delica Lancets 33G MISC Use to check glucose up to 4 times daily. Dx Code E11.9   propranolol ER (INDERAL LA) 60 MG 24 hr capsule Take 1 capsule (60 mg total) by mouth daily.   Semaglutide, 2 MG/DOSE, (OZEMPIC, 2 MG/DOSE,) 8 MG/3ML SOPN Inject 2 mg into the skin once a week.   traZODone (DESYREL) 150 MG tablet Take 1 tablet (150 mg total) by mouth at bedtime.   zinc gluconate  50 MG tablet Take 50 mg by mouth daily.   [DISCONTINUED] glucose blood test strip 1 each by Other route as needed for other. Use as instructed   glucose blood test strip 1 each by Other route as needed for other. Use as instructed. One Touch Ultra 2   No facility-administered encounter medications on file as of 05/17/2023.    Allergies (verified) Pneumovax 23 [pneumococcal vac polyvalent], Amaryl [glimepiride], Cephalexin, and Sulfa antibiotics   History: Past Medical History:  Diagnosis Date   Anemia, unspecified    Anxiety    CKD (chronic kidney disease), stage III (HCC)    Depression    Diabetes mellitus type II, non insulin  dependent (HCC)    On Ozempic and Farxiga   GERD (gastroesophageal reflux disease)    Hematuria    Hyperlipidemia    Hypertension    Mild mitral regurgitation 08/2017   Essentially normal-mild MR on echo.   Normal coronary arteries    Coronary CTA: No evidence of CAD.  Coronary calcium score 0.   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, Abigail [name]   Past Surgical History:  Procedure Laterality Date   CHOLECYSTECTOMY N/A 06/03/2015   Procedure: LAPAROSCOPIC CHOLECYSTECTOMY WITH INTRAOPERATIVE CHOLANGIOGRAM;  Surgeon: Manus Rudd, MD;  Location: [facility] OR;  Service: General;  Laterality: N/A;   COLONOSCOPY     GUM SURGERY  2024   gum transplant for recession   HAMMER TOE SURGERY     ORIF ANKLE FRACTURE Left 11/17/2014   Procedure: OPEN REDUCTION INTERNAL FIXATION (ORIF) LEFT TRIMALLEOLAR ANKLE FRACTURE;  Surgeon: Cheral Almas, MD;  Location: [facility] OR;  Service: Orthopedics;  Laterality: Left;   OVARIAN CYST REMOVAL     Family History  Problem Relation Age of Onset   Tremor Mother    Diabetes Mother    Hyperlipidemia Mother    Diabetes Father    Heart disease Father 54       AMI x 2; first AMI age 34   Stroke Father    Hypertension Father    Other Sister 74       Died in a car accident   Hyperlipidemia Brother    AAA (abdominal aortic aneurysm) Brother    Congestive Heart Failure Brother        s/p ICD   Coronary artery disease Brother        defibrillator; CHF   Kidney cancer Brother        Status post nephrectomy   Social History   Socioeconomic History   Marital status: Widowed    Spouse name: Not on file   Number of children: 2   Years of education: Not on file   Highest education level: High school graduate  Occupational History   Occupation: Surveyor, mining    Comment: Retired-Worked at [employer]  Tobacco Use   Smoking status: Never   Smokeless tobacco: Never  Vaping Use   Vaping Use: Never used  Substance and Sexual Activity    Alcohol use: Yes    Alcohol/week: 1.0 standard drink of alcohol    Types: 1 Glasses of wine per week   Drug use: No   Sexual activity: Yes    Birth control/protection: None  Other Topics Concern   Not on file  Social History Narrative   Marital status: married x 1970. ->  Now widowed      Children:  2 children; 7 grandchildren.      Employment:  [employer]. ->  Retired  Tobacco: none       Alcohol: sporadic       Exercises: walking 7 days per week.  20-30 minutes.  Walks her dog.   Social Determinants of Health   Financial Resource Strain: Low Risk  (05/17/2023)   Overall Financial Resource Strain (CARDIA)    Difficulty of Paying Living Expenses: Not hard at all  Food Insecurity: No Food Insecurity (05/17/2023)   Hunger Vital Sign    Worried About Running Out of Food in the Last Year: Never true    Ran Out of Food in the Last Year: Never true  Transportation Needs: No Transportation Needs (05/17/2023)   PRAPARE - Administrator, Civil Service (Medical): No    Lack of Transportation (Non-Medical): No  Physical Activity: Sufficiently Active (05/17/2023)   Exercise Vital Sign    Days of Exercise per Week: 7 days    Minutes of Exercise per Session: 40 min  Stress: No Stress Concern Present (05/17/2023)   [employer] of Occupational Health - Occupational Stress Questionnaire    Feeling of Stress : Only a little  Social Connections: Moderately Integrated (05/17/2023)   Social Connection and Isolation Panel [NHANES]    Frequency of Communication with Friends and Family: More than three times a week    Frequency of Social Gatherings with Friends and Family: More than three times a week    Attends Religious Services: More than 4 times per year    Active Member of [employer] or Organizations: Yes    Attends Banker Meetings: More than 4 times per year    Marital Status: Widowed    Tobacco Counseling Counseling given: Not  Answered   Clinical Intake:  Pre-visit preparation completed: Yes  Pain : No/denies pain  Nutritional Risks: None Diabetes: Yes CBG done?: No Did pt. bring in CBG monitor from home?: No  How often do you need to have someone help you when you read instructions, pamphlets, or other written materials from your doctor or pharmacy?: 1 - Never  Interpreter Needed?: No  Information entered by :: Donne Anon, CMA   Activities of Daily Living    05/17/2023    3:08 PM  In your present state of health, do you have any difficulty performing the following activities:  Hearing? 0  Vision? 0  Difficulty concentrating or making decisions? 0  Walking or climbing stairs? 0  Dressing or bathing? 0  Doing errands, shopping? 0  Preparing Food and eating ? N  Using the Toilet? N  In the past six months, have you accidently leaked urine? N  Do you have problems with loss of bowel control? N  Managing your Medications? N  Managing your Finances? N  Housekeeping or managing your Housekeeping? N    Patient Care Team: Copland, Gwenlyn Found, MD as PCP - General (Family Medicine) Marykay Lex, MD as PCP - Cardiology (Cardiology) Henrene Pastor, RPH-CPP (Pharmacist) Fredrich Birks, OD as Referring Physician (Optometry)  Indicate any recent Medical Services you may have received from other than [facility] providers in the past year (date may be approximate).     Assessment:   This is a routine wellness examination for Nakita.  Hearing/Vision screen No results found.  Dietary issues and exercise activities discussed:     Goals Addressed   None    Depression Screen    05/17/2023    3:14 PM 12/15/2022    1:06 PM 05/16/2022    3:29 PM 05/11/2022    9:48  AM 12/06/2021    1:28 PM 05/05/2021    9:45 AM 04/08/2021    9:46 AM  PHQ 2/9 Scores  PHQ - 2 Score 0 0 0 0 0 0 2  PHQ- 9 Score   0  0      Fall Risk    05/17/2023    3:15 PM 12/15/2022    1:06 PM 05/11/2022    9:47 AM 05/05/2021    9:44 AM  04/08/2021    9:47 AM  Fall Risk   Falls in the past year? 0 0 0 0 0  Number falls in past yr: 0 0 0 0   Injury with Fall? 0 0 0 0   Risk for fall due to : No Fall Risks  No Fall Risks    Follow up Falls evaluation completed Falls evaluation completed Falls evaluation completed Falls prevention discussed     MEDICARE RISK AT HOME:  Medicare Risk at Home - 05/17/23 1509     Any stairs in or around the home? No    If so, are there any without handrails? No    Home free of loose throw rugs in walkways, pet beds, electrical cords, etc? Yes    Adequate lighting in your home to reduce risk of falls? Yes    Life alert? No    Use of a cane, walker or w/c? No    Grab bars in the bathroom? No    Shower chair or bench in shower? No    Elevated toilet seat or a handicapped toilet? No             TIMED UP AND GO:  Was the test performed?  No    Cognitive Function:        05/17/2023    3:18 PM 05/11/2022    9:53 AM  6CIT Screen  What Year? 0 points 0 points  What month? 0 points 0 points  What time? 0 points 0 points  Count back from 20 0 points 0 points  Months in reverse 0 points 0 points  Repeat phrase 2 points 10 points  Total Score 2 points 10 points    Immunizations Immunization History  Administered Date(s) Administered   Fluad Quad(high Dose 65+) 07/01/2019   Influenza Whole 08/08/2013   Influenza, High Dose Seasonal PF 08/03/2016, 08/17/2017   Influenza, Seasonal, Injecte, Preservative Fre 10/08/2012   Influenza,inj,Quad PF,6+ Mos 08/05/2015   Influenza-Unspecified 08/08/2014, 08/13/2020, 07/31/2021   PFIZER(Purple Top)SARS-COV-2 Vaccination 01/13/2020, 02/12/2020, 08/13/2020   Pneumococcal Conjugate-13 11/03/2014   Pneumococcal Polysaccharide-23 02/10/2016   Td 12/06/2021   Zoster Recombinant(Shingrix) 09/13/2018, 10/05/2020    TDAP status: Up to date  Flu Vaccine status: Up to date  Pneumococcal vaccine status: Up to date  Covid-19 vaccine status:  Information provided on how to obtain vaccines.   Qualifies for Shingles Vaccine? Yes   Zostavax completed No   Shingrix Completed?: Yes  Screening Tests Health Maintenance  Topic Date Due   COVID-19 Vaccine (4 - 2023-24 season) 07/08/2022   Medicare Annual Wellness (AWV)  05/12/2023   Colonoscopy  06/20/2023   INFLUENZA VACCINE  06/08/2023   HEMOGLOBIN A1C  06/15/2023   Diabetic kidney evaluation - eGFR measurement  12/16/2023   Diabetic kidney evaluation - Urine ACR  12/16/2023   FOOT EXAM  12/16/2023   OPHTHALMOLOGY EXAM  03/01/2024   MAMMOGRAM  08/31/2024   DTaP/Tdap/Td (2 - Tdap) 12/07/2031   Pneumonia Vaccine 20+ Years old  Completed   DEXA SCAN  Completed   Hepatitis C Screening  Completed   Zoster Vaccines- Shingrix  Completed   HPV VACCINES  Aged Out    Health Maintenance  Health Maintenance Due  Topic Date Due   COVID-19 Vaccine (4 - 2023-24 season) 07/08/2022   Medicare Annual Wellness (AWV)  05/12/2023   Colonoscopy  06/20/2023    Colorectal cancer screening: Type of screening: Colonoscopy. Completed 06/19/18. Repeat every 5 years  Mammogram status: Completed 08/31/22. Repeat every year  Bone Density status: Completed 05/26/22. Results reflect: Bone density results: OSTEOPENIA. Repeat every 2 years.  Lung Cancer Screening: (Low Dose CT Chest recommended if Age 60-80 years, 20 pack-year currently smoking OR have quit w/in 15years.) does not qualify.   Additional Screening:  Hepatitis C Screening: does qualify; Completed 08/05/15  Vision Screening: Recommended annual ophthalmology exams for early detection of glaucoma and other disorders of the eye. Is the patient up to date with their annual eye exam?  Yes  Who is the provider or what is the name of the office in which the patient attends annual eye exams? [facility] If pt is not established with a provider, would they like to be referred to a provider to establish care? No .   Dental Screening:  Recommended annual dental exams for proper oral hygiene  Diabetic Foot Exam: Diabetic Foot Exam: Completed 12/15/22  Community Resource Referral / Chronic Care Management: CRR required this visit?  No   CCM required this visit?  No     Plan:     I have personally reviewed and noted the following in the patient's chart:   Medical and social history Use of alcohol, tobacco or illicit drugs  Current medications and supplements including opioid prescriptions. Patient is not currently taking opioid prescriptions. Functional ability and status Nutritional status Physical activity Advanced directives List of other physicians Hospitalizations, surgeries, and ER visits in previous 12 months Vitals Screenings to include cognitive, depression, and falls Referrals and appointments  In addition, I have reviewed and discussed with patient certain preventive protocols, quality metrics, and best practice recommendations. A written personalized care plan for preventive services as well as general preventive health recommendations were provided to patient.     Donne Anon, CMA   05/17/2023   After Visit Summary: (MyChart) Due to this being a telephonic visit, the after visit summary with patients personalized plan was offered to patient via MyChart   Nurse Notes: None

## 2023-05-17 NOTE — Patient Instructions (Signed)
Abigail [name] , Thank you for taking time to come for your Medicare Wellness Visit. I appreciate your ongoing commitment to your health goals. Please review the following plan we discussed and let me know if I can assist you in the future.     This is a list of the screening recommended for you and due dates:  Health Maintenance  Topic Date Due   COVID-19 Vaccine (4 - 2023-24 season) 07/08/2022   Colon Cancer Screening  06/20/2023   Flu Shot  06/08/2023   Hemoglobin A1C  06/15/2023   Yearly kidney function blood test for diabetes  12/16/2023   Yearly kidney health urinalysis for diabetes  12/16/2023   Complete foot exam   12/16/2023   Eye exam for diabetics  03/01/2024   Medicare Annual Wellness Visit  05/16/2024   Mammogram  08/31/2024   DTaP/Tdap/Td vaccine (2 - Tdap) 12/07/2031   Pneumonia Vaccine  Completed   DEXA scan (bone density measurement)  Completed   Hepatitis C Screening  Completed   Zoster (Shingles) Vaccine  Completed   HPV Vaccine  Aged Out    Next appointment: Follow up in one year for your annual wellness visit.   Preventive Care 16 Years and Older, Female Preventive care refers to lifestyle choices and visits with your health care provider that can promote health and wellness. What does preventive care include? A yearly physical exam. This is also called an annual well check. Dental exams once or twice a year. Routine eye exams. Ask your health care provider how often you should have your eyes checked. Personal lifestyle choices, including: Daily care of your teeth and gums. Regular physical activity. Eating a healthy diet. Avoiding tobacco and drug use. Limiting alcohol use. Practicing safe sex. Taking low-dose aspirin every day. Taking vitamin and mineral supplements as recommended by your health care provider. What happens during an annual well check? The services and screenings done by your health care provider during your annual well check will  depend on your age, overall health, lifestyle risk factors, and family history of disease. Counseling  Your health care provider may ask you questions about your: Alcohol use. Tobacco use. Drug use. Emotional well-being. Home and relationship well-being. Sexual activity. Eating habits. History of falls. Memory and ability to understand (cognition). Work and work Astronomer. Reproductive health. Screening  You may have the following tests or measurements: Height, weight, and BMI. Blood pressure. Lipid and cholesterol levels. These may be checked every 5 years, or more frequently if you are over 17 years old. Skin check. Lung cancer screening. You may have this screening every year starting at age 29 if you have a 30-pack-year history of smoking and currently smoke or have quit within the past 15 years. Fecal occult blood test (FOBT) of the stool. You may have this test every year starting at age 37. Flexible sigmoidoscopy or colonoscopy. You may have a sigmoidoscopy every 5 years or a colonoscopy every 10 years starting at age 59. Hepatitis C blood test. Hepatitis B blood test. Sexually transmitted disease (STD) testing. Diabetes screening. This is done by checking your blood sugar (glucose) after you have not eaten for a while (fasting). You may have this done every 1-3 years. Bone density scan. This is done to screen for osteoporosis. You may have this done starting at age 75. Mammogram. This may be done every 1-2 years. Talk to your health care provider about how often you should have regular mammograms. Talk with your health care provider  about your test results, treatment options, and if necessary, the need for more tests. Vaccines  Your health care provider may recommend certain vaccines, such as: Influenza vaccine. This is recommended every year. Tetanus, diphtheria, and acellular pertussis (Tdap, Td) vaccine. You may need a Td booster every 10 years. Zoster vaccine. You may  need this after age 80. Pneumococcal 13-valent conjugate (PCV13) vaccine. One dose is recommended after age 7. Pneumococcal polysaccharide (PPSV23) vaccine. One dose is recommended after age 32. Talk to your health care provider about which screenings and vaccines you need and how often you need them. This information is not intended to replace advice given to you by your health care provider. Make sure you discuss any questions you have with your health care provider. Document Released: 11/20/2015 Document Revised: 07/13/2016 Document Reviewed: 08/25/2015 Elsevier Interactive Patient Education  2017 [employer].  Fall Prevention in the Home Falls can cause injuries. They can happen to people of all ages. There are many things you can do to make your home safe and to help prevent falls. What can I do on the outside of my home? Regularly fix the edges of walkways and driveways and fix any cracks. Remove anything that might make you trip as you walk through a door, such as a raised step or threshold. Trim any bushes or trees on the path to your home. Use bright outdoor lighting. Clear any walking paths of anything that might make someone trip, such as rocks or tools. Regularly check to see if handrails are loose or broken. Make sure that both sides of any steps have handrails. Any raised decks and porches should have guardrails on the edges. Have any leaves, snow, or ice cleared regularly. Use sand or salt on walking paths during winter. Clean up any spills in your garage right away. This includes oil or grease spills. What can I do in the bathroom? Use night lights. Install grab bars by the toilet and in the tub and shower. Do not use towel bars as grab bars. Use non-skid mats or decals in the tub or shower. If you need to sit down in the shower, use a plastic, non-slip stool. Keep the floor dry. Clean up any water that spills on the floor as soon as it happens. Remove soap buildup in  the tub or shower regularly. Attach bath mats securely with double-sided non-slip rug tape. Do not have throw rugs and other things on the floor that can make you trip. What can I do in the bedroom? Use night lights. Make sure that you have a light by your bed that is easy to reach. Do not use any sheets or blankets that are too big for your bed. They should not hang down onto the floor. Have a firm chair that has side arms. You can use this for support while you get dressed. Do not have throw rugs and other things on the floor that can make you trip. What can I do in the kitchen? Clean up any spills right away. Avoid walking on wet floors. Keep items that you use a lot in easy-to-reach places. If you need to reach something above you, use a strong step stool that has a grab bar. Keep electrical cords out of the way. Do not use floor polish or wax that makes floors slippery. If you must use wax, use non-skid floor wax. Do not have throw rugs and other things on the floor that can make you trip. What can I do  with my stairs? Do not leave any items on the stairs. Make sure that there are handrails on both sides of the stairs and use them. Fix handrails that are broken or loose. Make sure that handrails are as long as the stairways. Check any carpeting to make sure that it is firmly attached to the stairs. Fix any carpet that is loose or worn. Avoid having throw rugs at the top or bottom of the stairs. If you do have throw rugs, attach them to the floor with carpet tape. Make sure that you have a light switch at the top of the stairs and the bottom of the stairs. If you do not have them, ask someone to add them for you. What else can I do to help prevent falls? Wear shoes that: Do not have high heels. Have rubber bottoms. Are comfortable and fit you well. Are closed at the toe. Do not wear sandals. If you use a stepladder: Make sure that it is fully opened. Do not climb a closed  stepladder. Make sure that both sides of the stepladder are locked into place. Ask someone to hold it for you, if possible. Clearly mark and make sure that you can see: Any grab bars or handrails. First and last steps. Where the edge of each step is. Use tools that help you move around (mobility aids) if they are needed. These include: Canes. Walkers. Scooters. Crutches. Turn on the lights when you go into a dark area. Replace any light bulbs as soon as they burn out. Set up your furniture so you have a clear path. Avoid moving your furniture around. If any of your floors are uneven, fix them. If there are any pets around you, be aware of where they are. Review your medicines with your doctor. Some medicines can make you feel dizzy. This can increase your chance of falling. Ask your doctor what other things that you can do to help prevent falls. This information is not intended to replace advice given to you by your health care provider. Make sure you discuss any questions you have with your health care provider. Document Released: 08/20/2009 Document Revised: 03/31/2016 Document Reviewed: 11/28/2014 Elsevier Interactive Patient Education  2017 [employer].

## 2023-05-30 ENCOUNTER — Other Ambulatory Visit: Payer: Self-pay | Admitting: Family Medicine

## 2023-05-30 DIAGNOSIS — F329 Major depressive disorder, single episode, unspecified: Secondary | ICD-10-CM

## 2023-05-30 DIAGNOSIS — E785 Hyperlipidemia, unspecified: Secondary | ICD-10-CM

## 2023-05-30 DIAGNOSIS — F4323 Adjustment disorder with mixed anxiety and depressed mood: Secondary | ICD-10-CM

## 2023-06-01 NOTE — Telephone Encounter (Signed)
Received a letter regarding shipment for FARXIGA(AZ&ME) 06/01/2023   LETTER IN MEDIA OF CHART

## 2023-06-04 NOTE — Progress Notes (Unsigned)
[facility] at [facility] [address], [address] [place], [place] [address] [phone]  Date:  06/07/2023   Name:  Abigail [name]   DOB:  09-Dec-1950   MRN:  [id number]  PCP:  Pearline Cables, MD    Chief Complaint: No chief complaint on file.   History of Present Illness:  Abigail [name] is a 72 y.o. very pleasant female patient who presents with the following:  Patient seen today for periodic follow-up Most recent visit with myself was in February History of diabetes, hyperlipidemia, hypertension, chronic renal insufficiency with nephrology follow-up   At her last visit she was concerned about tremor, I referred her to neurology and she was seen by Dr. Frances Furbish in [place] like they thought essential tremor, family history of same.  No evidence of parkinsonism  Colon cancer screening is coming due this August  Lipitor 20 Farxiga 10 Prozac 40 Omeprazole 20 Inderal LA 60 daily Ozempic 2 mg Trazodone  Lab Results  Component Value Date   HGBA1C 7.6 (H) 12/15/2022    Patient Active Problem List   Diagnosis Date Noted   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/29/2020   Essential hypertension    Gastroesophageal reflux disease without esophagitis    Microscopic hematuria 09/16/2015   Trimalleolar fracture of ankle, closed 11/17/2014   Hyperlipidemia associated with type 2 diabetes mellitus (HCC) 07/24/2013   Chest pain 10/23/2012   Type 2 diabetes mellitus without complication, without long-term current use of insulin (HCC) 10/08/2012   Anxiety 10/08/2012    Past Medical History:  Diagnosis Date   Anemia, unspecified    Anxiety    CKD (chronic kidney disease), stage III (HCC)    Depression    Diabetes mellitus type II, non insulin dependent (HCC)    On Ozempic and Farxiga   GERD (gastroesophageal reflux disease)    Hematuria    Hyperlipidemia    Hypertension    Mild mitral regurgitation 08/2017   Essentially normal-mild MR on  echo.   Normal coronary arteries    Coronary CTA: No evidence of CAD.  Coronary calcium score 0.   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   HAMMER TOE SURGERY     ORIF ANKLE FRACTURE Left 11/17/2014   Procedure: OPEN REDUCTION INTERNAL FIXATION (ORIF) LEFT TRIMALLEOLAR ANKLE FRACTURE;  Surgeon: Cheral Almas, MD;  Location: [facility] OR;  Service: Orthopedics;  Laterality: Left;   OVARIAN CYST REMOVAL      Social History   Tobacco Use   Smoking status: Never   Smokeless tobacco: Never  Vaping Use   Vaping status: Never Used  Substance Use Topics   Alcohol use: Yes    Alcohol/week: 1.0 standard drink of alcohol    Types: 1 Glasses of wine per week   Drug use: No    Family History  Problem Relation Age of Onset   Tremor Mother    Diabetes Mother    Hyperlipidemia Mother    Diabetes Father    Heart disease Father 18       AMI x 2; first AMI age 1   Stroke Father    Hypertension Father  Other Sister 18       Died in a car accident   Hyperlipidemia Brother    AAA (abdominal aortic aneurysm) Brother    Congestive Heart Failure Brother        s/p ICD   Coronary artery disease Brother        defibrillator; CHF   Kidney cancer Brother        Status post nephrectomy    Allergies  Allergen Reactions   Pneumovax 23 [Pneumococcal Vac Polyvalent] Other (See Comments)    Pt had severe arm pain   Amaryl [Glimepiride] Nausea Only   Cephalexin Hives   Sulfa Antibiotics Rash    Medication list has been reviewed and updated.  Current Outpatient Medications on File Prior to Visit  Medication Sig Dispense Refill   Ascorbic Acid (VITAMIN C) 1000  MG tablet Take 1,000 mg by mouth daily.     atorvastatin (LIPITOR) 20 MG tablet TAKE ONE TABLET BY MOUTH ONCE DAILY Dr only approved 30DS, please call and schedule appt for further refills 30 tablet 2   B Complex-C (SUPER B COMPLEX PO) Take 1 tablet by mouth daily.     blood glucose meter kit and supplies Dispense based on patient and insurance preference. Use up to four times daily as directed. (FOR ICD-10 E10.9, E11.9). 1 each 0   Blood Glucose Monitoring Suppl (ONE TOUCH ULTRA 2) w/Device KIT by Does not apply route.     calcium carbonate (TUMS - DOSED IN MG ELEMENTAL CALCIUM) 500 MG chewable tablet Chew 1 tablet by mouth daily.     Cholecalciferol (VITAMIN D3) 50 MCG (2000 UT) CAPS Take 1 capsule by mouth daily.     FARXIGA 10 MG TABS tablet Take 1 tablet (10 mg total) by mouth daily. For [place] 90 tablet 3   FLUoxetine (PROZAC) 40 MG capsule TAKE ONE CAPSULE BY MOUTH ONCE DAILY Dr only approved 30DS due to patient needing appt, please call and schedule 30 capsule 2   fluticasone (FLONASE) 50 MCG/ACT nasal spray Place 2 sprays into both nostrils daily. 16 g 1   glucose blood test strip 1 each by Other route as needed for other. Use as instructed. One Touch Ultra 2 100 each 3   meclizine (ANTIVERT) 25 MG tablet Take 1 tablet (25 mg total) by mouth 3 (three) times daily as needed for dizziness. 30 tablet 0   Multiple Vitamins-Minerals (ALIVE WOMENS 50+) TABS Take 1 tablet by mouth daily.     nitroGLYCERIN (NITROSTAT) 0.4 MG SL tablet PLACE 1 TABLET UNDER THE TONGUE EVERY 5 MINUTES AS NEEDED FOR CHEST PAIN 25 tablet 2   omeprazole (PRILOSEC) 20 MG capsule Take 1 capsule (20 mg total) by mouth daily. 90 capsule 0   OneTouch Delica Lancets 33G MISC Use to check glucose up to 4 times daily. Dx Code E11.9 400 each 1   propranolol ER (INDERAL LA) 60 MG 24 hr capsule Take 1 capsule (60 mg total) by mouth daily. 30 capsule 5   Semaglutide, 2 MG/DOSE, (OZEMPIC, 2 MG/DOSE,) 8 MG/3ML  SOPN Inject 2 mg into the skin once a week. 3 mL 3   traZODone (DESYREL) 150 MG tablet TAKE ONE TABLET BY MOUTH EVERYDAY AT BEDTIME Dr only approved 30DS, please call and schedule appt for further refills 30 tablet 2   zinc gluconate 50 MG tablet Take 50 mg by mouth daily.     No current facility-administered medications on file prior to visit.  Review of Systems:  As per HPI- otherwise negative.   Physical Examination: There were no vitals filed for this visit. There were no vitals filed for this visit. There is no height or weight on file to calculate BMI. Ideal Body Weight:    GEN: no acute distress. HEENT: Atraumatic, Normocephalic.  Ears and Nose: No external deformity. CV: RRR, No M/G/R. No JVD. No thrill. No extra heart sounds. PULM: CTA B, no wheezes, crackles, rhonchi. No retractions. No resp. distress. No accessory muscle use. ABD: S, NT, ND, +BS. No rebound. No HSM. EXTR: No c/c/e PSYCH: Normally interactive. Conversant.    Assessment and Plan: ***  Signed Abbe Amsterdam, MD

## 2023-06-05 DIAGNOSIS — N1832 Chronic kidney disease, stage 3b: Secondary | ICD-10-CM | POA: Diagnosis not present

## 2023-06-05 DIAGNOSIS — I129 Hypertensive chronic kidney disease with stage 1 through stage 4 chronic kidney disease, or unspecified chronic kidney disease: Secondary | ICD-10-CM | POA: Diagnosis not present

## 2023-06-05 DIAGNOSIS — E1122 Type 2 diabetes mellitus with diabetic chronic kidney disease: Secondary | ICD-10-CM | POA: Diagnosis not present

## 2023-06-07 ENCOUNTER — Ambulatory Visit (INDEPENDENT_AMBULATORY_CARE_PROVIDER_SITE_OTHER): Payer: PPO | Admitting: Family Medicine

## 2023-06-07 ENCOUNTER — Encounter: Payer: Self-pay | Admitting: Family Medicine

## 2023-06-07 VITALS — BP 122/80 | HR 83 | Temp 97.6°F | Resp 18 | Ht 65.0 in | Wt 142.6 lb

## 2023-06-07 DIAGNOSIS — Z7984 Long term (current) use of oral hypoglycemic drugs: Secondary | ICD-10-CM | POA: Diagnosis not present

## 2023-06-07 DIAGNOSIS — R251 Tremor, unspecified: Secondary | ICD-10-CM

## 2023-06-07 DIAGNOSIS — R5383 Other fatigue: Secondary | ICD-10-CM

## 2023-06-07 DIAGNOSIS — E1169 Type 2 diabetes mellitus with other specified complication: Secondary | ICD-10-CM | POA: Diagnosis not present

## 2023-06-07 DIAGNOSIS — R1011 Right upper quadrant pain: Secondary | ICD-10-CM

## 2023-06-07 DIAGNOSIS — E119 Type 2 diabetes mellitus without complications: Secondary | ICD-10-CM

## 2023-06-07 DIAGNOSIS — R197 Diarrhea, unspecified: Secondary | ICD-10-CM

## 2023-06-07 DIAGNOSIS — Z7985 Long-term (current) use of injectable non-insulin antidiabetic drugs: Secondary | ICD-10-CM | POA: Diagnosis not present

## 2023-06-07 DIAGNOSIS — I10 Essential (primary) hypertension: Secondary | ICD-10-CM | POA: Diagnosis not present

## 2023-06-07 DIAGNOSIS — E785 Hyperlipidemia, unspecified: Secondary | ICD-10-CM | POA: Diagnosis not present

## 2023-06-07 LAB — COMPREHENSIVE METABOLIC PANEL
ALT: 15 U/L (ref 0–35)
AST: 15 U/L (ref 0–37)
Albumin: 4.5 g/dL (ref 3.5–5.2)
Alkaline Phosphatase: 84 U/L (ref 39–117)
BUN: 26 mg/dL — ABNORMAL HIGH (ref 6–23)
CO2: 29 mEq/L (ref 19–32)
Calcium: 9.5 mg/dL (ref 8.4–10.5)
Chloride: 97 mEq/L (ref 96–112)
Creatinine, Ser: 1.53 mg/dL — ABNORMAL HIGH (ref 0.40–1.20)
GFR: 33.49 mL/min — ABNORMAL LOW (ref >60.00)
Glucose, Bld: 140 mg/dL — ABNORMAL HIGH (ref 70–99)
Potassium: 4.8 mEq/L (ref 3.5–5.1)
Sodium: 136 mEq/L (ref 135–145)
Total Bilirubin: 0.4 mg/dL (ref 0.2–1.2)
Total Protein: 7 g/dL (ref 6.0–8.3)

## 2023-06-07 LAB — CBC
HCT: 44 % (ref 36.0–46.0)
Hemoglobin: 14 g/dL (ref 12.0–15.0)
MCHC: 31.9 g/dL (ref 30.0–36.0)
MCV: 85.9 fl (ref 78.0–100.0)
Platelets: 223 10*3/uL (ref 150.0–400.0)
RBC: 5.13 Mil/uL — ABNORMAL HIGH (ref 3.87–5.11)
RDW: 14.2 % (ref 11.5–15.5)
WBC: 6.6 10*3/uL (ref 4.0–10.5)

## 2023-06-07 LAB — VITAMIN B12: Vitamin B-12: 366 pg/mL (ref 211–911)

## 2023-06-07 LAB — FERRITIN: Ferritin: 10 ng/mL (ref 10.0–291.0)

## 2023-06-07 LAB — HEMOGLOBIN A1C: Hgb A1c MFr Bld: 7.5 % — ABNORMAL HIGH (ref 4.6–6.5)

## 2023-06-07 MED ORDER — CHOLESTYRAMINE 4 G PO PACK: 4.0000 g | PACK | Freq: Three times a day (TID) | ORAL | 12 refills | Status: DC

## 2023-06-07 NOTE — Patient Instructions (Signed)
It was good to see you today, I hope you have a great time on your trip.  After labs, stop by imaging on the ground floor and schedule your right upper quadrant ultrasound  I will do some additional lab work to see if we can find an explanation for fatigue  I suspect the diarrhea you experience after eating is due to difficulty digesting fats since your gallbladder was removed  I gave you medication called cholestyramine to use as needed, try taking this prior to a heavier meal and see if it helps you.  You can take it up to 3 times a day as needed

## 2023-06-08 ENCOUNTER — Encounter: Payer: Self-pay | Admitting: Family Medicine

## 2023-06-08 ENCOUNTER — Ambulatory Visit (HOSPITAL_BASED_OUTPATIENT_CLINIC_OR_DEPARTMENT_OTHER)
Admission: RE | Admit: 2023-06-08 | Discharge: 2023-06-08 | Disposition: A | Discharge status: Home / Self Care | Payer: PPO | Source: Ambulatory Visit | Attending: Family Medicine | Admitting: Family Medicine

## 2023-06-08 DIAGNOSIS — R1011 Right upper quadrant pain: Secondary | ICD-10-CM | POA: Insufficient documentation

## 2023-06-23 DIAGNOSIS — A09 Infectious gastroenteritis and colitis, unspecified: Secondary | ICD-10-CM | POA: Diagnosis not present

## 2023-06-27 ENCOUNTER — Telehealth: Payer: Self-pay | Admitting: Family Medicine

## 2023-06-27 DIAGNOSIS — R1011 Right upper quadrant pain: Secondary | ICD-10-CM

## 2023-06-27 DIAGNOSIS — R197 Diarrhea, unspecified: Secondary | ICD-10-CM

## 2023-06-27 NOTE — Telephone Encounter (Signed)
Pt called & stated she saw Dr. Patsy Lager for an ultrasound on her right hip & requested to receive a referral for a gastrologist regarding the same issues. Please advise pt if her request can be accommodated.

## 2023-06-27 NOTE — Telephone Encounter (Signed)
I do not see in her note where she requested to see GI. Are you ok with the referral?

## 2023-06-27 NOTE — Addendum Note (Signed)
Addended by: Abbe Amsterdam C on: 06/27/2023 02:30 PM   Modules accepted: Orders

## 2023-06-27 NOTE — Telephone Encounter (Signed)
Called patient back, she is still having the same right upper quadrant discomfort she was having at the end of July.  She is status postcholecystectomy, I did an ultrasound at that time which was noncontributory  She is in [facility] patient, most recent colonoscopy 2019.  I will place a referral for her to see them.  However, I asked her to please let me know if her symptoms are changing or getting worse in which case I can recheck her in the meantime

## 2023-07-24 ENCOUNTER — Telehealth: Payer: Self-pay | Admitting: Neurology

## 2023-07-24 NOTE — Telephone Encounter (Signed)
Pt rescheduled appt due to having insurance issues. Insurance with [employer] was cancelled by mistake and changed to [facility]. Which was not requested. Working on [employer].

## 2023-08-03 ENCOUNTER — Ambulatory Visit: Payer: Medicare Other | Admitting: Neurology

## 2023-08-11 ENCOUNTER — Telehealth: Payer: Self-pay | Admitting: Neurology

## 2023-08-11 ENCOUNTER — Encounter: Payer: Self-pay | Admitting: Family Medicine

## 2023-08-11 DIAGNOSIS — K219 Gastro-esophageal reflux disease without esophagitis: Secondary | ICD-10-CM

## 2023-08-11 MED ORDER — OMEPRAZOLE 20 MG PO CPDR: 20.0000 mg | DELAYED_RELEASE_CAPSULE | Freq: Every day | ORAL | 0 refills | Status: DC

## 2023-08-11 MED ORDER — PROPRANOLOL HCL ER 60 MG PO CP24: 60.0000 mg | ORAL_CAPSULE | Freq: Every day | ORAL | 0 refills | Status: DC

## 2023-08-11 NOTE — Telephone Encounter (Signed)
Needs propanolol refilled at [facility] pharmacy .  Friday 14.11, 10 -02-2023

## 2023-08-15 ENCOUNTER — Encounter: Payer: Self-pay | Admitting: Family Medicine

## 2023-08-15 ENCOUNTER — Other Ambulatory Visit (HOSPITAL_BASED_OUTPATIENT_CLINIC_OR_DEPARTMENT_OTHER): Payer: Self-pay | Admitting: Family Medicine

## 2023-08-15 ENCOUNTER — Telehealth: Payer: Self-pay

## 2023-08-15 DIAGNOSIS — E119 Type 2 diabetes mellitus without complications: Secondary | ICD-10-CM

## 2023-08-15 DIAGNOSIS — Z1231 Encounter for screening mammogram for malignant neoplasm of breast: Secondary | ICD-10-CM

## 2023-08-15 MED ORDER — OZEMPIC (2 MG/DOSE) 8 MG/3ML ~~LOC~~ SOPN: 2.0000 mg | PEN_INJECTOR | SUBCUTANEOUS | 3 refills | Status: DC

## 2023-08-15 NOTE — Telephone Encounter (Signed)
Your request has been approved 08-OCT-24:08-OCT-25 Ozempic (2 MG/DOSE) 8MG /3ML [place] SOPN Quantity:3;

## 2023-08-15 NOTE — Telephone Encounter (Signed)
PA initiated.  Abigail [name] (Key: [id number]) Ozempic

## 2023-08-16 ENCOUNTER — Telehealth: Payer: Self-pay | Admitting: Family Medicine

## 2023-08-16 ENCOUNTER — Encounter: Payer: Self-pay | Admitting: Pharmacist

## 2023-08-16 NOTE — Telephone Encounter (Signed)
Patient called back. She verified that she does not have any other coverage for medications other than her Medicare [employer] plan.

## 2023-08-16 NOTE — Progress Notes (Signed)
08/16/2023 Name: Abigail [name] MRN: [id number] DOB: [birth year]  Chief Complaint  Patient presents with   Medication Problem    Ozempic    [name] is a 72 y.o. year old female who is requesting a refill thru [employer] medication assistance program for [employer]. Spoke with [employer] today.    They were referred to the pharmacist by their PCP for assistance in managing medication access.    Subjective:  Medication Access/Adherence;  Patient reports that she needs to request shipment of Ozempic. She has been receiving Ozmepic through [employer] medication assistance program. Her last shipment was received in 04/06/2023.   Spoke with representative the [employer] medication assistance program. They state that they need a copay of patient's Express Scripts card - front and back. I do not see that the patient has Express Scripts and we do not have a copy of an Express Scripts care on file.  Verified that representative was sure that she was looking at correct patient. And she verified she was.    Objective:  Lab Results  Component Value Date   HGBA1C 7.5 (H) 06/07/2023    Lab Results  Component Value Date   CREATININE 1.53 (H) 06/07/2023   BUN 26 (H) 06/07/2023   NA 136 06/07/2023   K 4.8 06/07/2023   CL 97 06/07/2023   CO2 29 06/07/2023      Assessment/Plan:  Medication Access:  - Left message with patient to call our office regarding her Ozempic and insurance. Per [employer] we will need to provide a termination letter showing that she no longer has Express Scripts. They will not be able to provide Ozempic until resolved.  Fax to [phone] with patient ID: [phone] and include patient DOB and Name or initials - I also asked [employer] rep to resent letter asking for insurance verification.    Henrene Pastor, PharmD Clinical Pharmacist [facility]

## 2023-08-16 NOTE — Telephone Encounter (Signed)
With Pt assistance how does refills get ordered?

## 2023-08-16 NOTE — Telephone Encounter (Signed)
Pt returned pharmacist's call. Please return call to discuss

## 2023-08-16 NOTE — Telephone Encounter (Addendum)
I also called her pharmacy and had them check their records. They also performed a check using her Medicare number and confirmed that patient does not have Express Scripts.  [employer] and spoke to a different representative. This time I was told that they just needed a copy of the front and back of patient's insurance care.  Fax sent to [employer] with pt name, DOB, [employer] ID #.  Patient has 1 dose of Ozempic 2mg  left. It will likely be 10 to 14 days before she receives her next Ozempic delivery. Instructed patient to inject Ozempic 1mg  weekly for the next 2 weeks. Will restart Ozempic 2mg  once [employer] medication assistance program delivery is received.

## 2023-08-21 ENCOUNTER — Ambulatory Visit (INDEPENDENT_AMBULATORY_CARE_PROVIDER_SITE_OTHER): Payer: Self-pay | Admitting: Pharmacist

## 2023-08-21 DIAGNOSIS — E119 Type 2 diabetes mellitus without complications: Secondary | ICD-10-CM

## 2023-08-21 DIAGNOSIS — Z7985 Long-term (current) use of injectable non-insulin antidiabetic drugs: Secondary | ICD-10-CM

## 2023-08-21 NOTE — Progress Notes (Signed)
08/21/2023 Name: Abigail [name] MRN: [id number] DOB: 02/01/1951  Chief Complaint  Patient presents with   Medication Management    Ozempic MAP    Abigail [name] is a 72 y.o. year old female who is requesting a refill thru [employer] medication assistance program for [employer]. Spoke with [employer] today.    They were referred to the pharmacist by their PCP for assistance in managing medication access.    Subjective:  Medication Access/Adherence;  Patient  has been receiving Ozmepic through [employer] medication assistance program. Her last shipment was received in 04/06/2023. Last week we had to [employer] about her next shipment and they requested [place] to send copay of of patient's insurance card. This has been submitted but when I called [employer]'s automated system today, her Ozempic refill had not been processed yet.    Objective:  Lab Results  Component Value Date   HGBA1C 7.5 (H) 06/07/2023    Lab Results  Component Value Date   CREATININE 1.53 (H) 06/07/2023   BUN 26 (H) 06/07/2023   NA 136 06/07/2023   K 4.8 06/07/2023   CL 97 06/07/2023   CO2 29 06/07/2023      Assessment/Plan:  Medication Access:  - [employer] - representative I spoke with today, sent Ozempic to be processed today. Should receive in our office in 10 to 14 business days.  Patient notified - LM on her VM.  - Also will request [employer] to mail out 2025 application for Ozempic and [place].  Henrene Pastor, PharmD Clinical Pharmacist [facility]

## 2023-08-31 ENCOUNTER — Telehealth: Payer: Self-pay

## 2023-08-31 NOTE — Telephone Encounter (Signed)
LMOM informing Pt that Pt assistance supply of Ozempic is here.

## 2023-09-02 ENCOUNTER — Other Ambulatory Visit: Payer: Self-pay | Admitting: Neurology

## 2023-09-04 ENCOUNTER — Telehealth: Payer: PPO

## 2023-09-05 ENCOUNTER — Telehealth: Payer: Self-pay | Admitting: Family Medicine

## 2023-09-05 ENCOUNTER — Ambulatory Visit (HOSPITAL_BASED_OUTPATIENT_CLINIC_OR_DEPARTMENT_OTHER)
Admission: RE | Admit: 2023-09-05 | Discharge: 2023-09-05 | Disposition: A | Discharge status: Home / Self Care | Payer: PPO | Source: Ambulatory Visit | Attending: Family Medicine | Admitting: Family Medicine

## 2023-09-05 ENCOUNTER — Encounter (HOSPITAL_BASED_OUTPATIENT_CLINIC_OR_DEPARTMENT_OTHER): Payer: Self-pay

## 2023-09-05 DIAGNOSIS — Z1231 Encounter for screening mammogram for malignant neoplasm of breast: Secondary | ICD-10-CM | POA: Diagnosis not present

## 2023-09-05 NOTE — Telephone Encounter (Signed)
Pt dropped off paperwork for you. Placed in your tray.

## 2023-09-12 ENCOUNTER — Telehealth: Payer: Self-pay

## 2023-09-12 NOTE — Telephone Encounter (Signed)
-----   Message from Henrene Pastor sent at 08/21/2023 11:08 AM EDT ----- Please mail patient applications for 2025 renewals for Ozempic / [employer] and [place] / [place] [employer] [place]

## 2023-09-12 NOTE — Telephone Encounter (Signed)
Forms for [employer] received. Reviewed and updated needed information. Forwarded to PCP for review and signature.

## 2023-09-13 ENCOUNTER — Telehealth: Payer: Self-pay | Admitting: Family Medicine

## 2023-09-13 NOTE — Telephone Encounter (Signed)
PAP: PAP application for [place] and Ozempic, Publishing rights manager ([place]) and [employer]) has been mailed to [employer] address on file. Will fax provider portion of application to provider's office when pt's portion is received.  PLEASE BE ADVISED I SENT A MYCHART MESSAGE

## 2023-09-13 NOTE — Telephone Encounter (Signed)
Pt requesting refills on Fluoxetine and Atorvastatin. She said her bottles show she needs ov for refills but pt said provider doesn't always make her come in for appts. Advised that it's been close to 6 mths and provider does like to have patients come in every 6 mths. [facility] [address] [facility]. Please let pt know if she needs to be seen or if we can cancel appt.

## 2023-09-13 NOTE — Telephone Encounter (Signed)
PAP: PAP application for Ozempic, [employer]) has been mailed to pt's home address on file. Will fax provider portion of application to provider's office when pt's portion is received.

## 2023-09-14 ENCOUNTER — Other Ambulatory Visit: Payer: Self-pay

## 2023-09-14 DIAGNOSIS — E785 Hyperlipidemia, unspecified: Secondary | ICD-10-CM

## 2023-09-14 DIAGNOSIS — F4323 Adjustment disorder with mixed anxiety and depressed mood: Secondary | ICD-10-CM

## 2023-09-14 MED ORDER — ATORVASTATIN CALCIUM 20 MG PO TABS: 20.0000 mg | ORAL_TABLET | Freq: Every day | ORAL | 0 refills | Status: DC

## 2023-09-14 MED ORDER — FLUOXETINE HCL 40 MG PO CAPS: 40.0000 mg | ORAL_CAPSULE | Freq: Every day | ORAL | 0 refills | Status: DC

## 2023-09-14 NOTE — Telephone Encounter (Signed)
Refills have been sent. Pt was last seen this July- advised by PCP to rtn for 6 month f/u. Which would be around January.

## 2023-09-17 NOTE — Progress Notes (Unsigned)
[facility] at [facility] [address], [address] [place], [place] [address] [phone]  Date:  09/18/2023   Name:  Abigail [name]   DOB:  February 19, 1951   MRN:  [id number]  PCP:  Abigail Cables, MD    Chief Complaint: No chief complaint on file.   History of Present Illness:  Abigail [name] is a 72 y.o. very pleasant female patient who presents with the following:  Pt seen today for follow-up, refills and flu shot Last seen by myself in August   History of diabetes, hyperlipidemia, hypertension, chronic renal insufficiency with nephrology follow-up, essential tremor seen by neurology   Labs done in July Lab Results  Component Value Date   HGBA1C 7.5 (H) 06/07/2023     Seen by Dr Abigail [name] 7/29  Flu  Colon cancer screening Covid booster   Lab Results  Component Value Date   HGBA1C 7.5 (H) 06/07/2023     Patient Active Problem List   Diagnosis Date Noted   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/29/2020   Essential hypertension    Gastroesophageal reflux disease without esophagitis    Microscopic hematuria 09/16/2015   Trimalleolar fracture of ankle, closed 11/17/2014   Hyperlipidemia associated with type 2 diabetes mellitus (HCC) 07/24/2013   Chest pain 10/23/2012   Type 2 diabetes mellitus without complication, without long-term current use of insulin (HCC) 10/08/2012   Anxiety 10/08/2012    Past Medical History:  Diagnosis Date   Anemia, unspecified    Anxiety    CKD (chronic kidney disease), stage III (HCC)    Depression    Diabetes mellitus type II, non insulin dependent (HCC)    On Ozempic and Farxiga   GERD (gastroesophageal reflux disease)    Hematuria    Hyperlipidemia    Hypertension    Mild mitral regurgitation 08/2017   Essentially normal-mild MR on echo.   Normal coronary arteries    Coronary CTA: No evidence of CAD.  Coronary calcium score 0.   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, Abigail [name]     Past Surgical History:  Procedure Laterality Date   CHOLECYSTECTOMY N/A 06/03/2015   Procedure: LAPAROSCOPIC CHOLECYSTECTOMY WITH INTRAOPERATIVE CHOLANGIOGRAM;  Surgeon: Manus Rudd, MD;  Location: [facility] OR;  Service: General;  Laterality: N/A;   COLONOSCOPY     GUM SURGERY  2024   gum transplant for recession   HAMMER TOE SURGERY     ORIF ANKLE FRACTURE Left 11/17/2014   Procedure: OPEN REDUCTION INTERNAL FIXATION (ORIF) LEFT TRIMALLEOLAR ANKLE FRACTURE;  Surgeon: Cheral Almas, MD;  Location: [facility] OR;  Service: Orthopedics;  Laterality: Left;   OVARIAN CYST REMOVAL      Social History   Tobacco Use   Smoking status: Never   Smokeless tobacco: Never  Vaping Use   Vaping status: Never Used  Substance Use Topics   Alcohol use: Yes    Alcohol/week: 1.0 standard drink of alcohol    Types: 1 Glasses of wine per week   Drug use: No    Family History  Problem Relation Age of Onset   Tremor Mother    Diabetes Mother    Hyperlipidemia Mother    Diabetes Father    Heart disease Father 15       AMI x 2; first AMI age 83   Stroke Father    Hypertension Father    Other Sister 57       Died in a car accident   Hyperlipidemia  Brother    AAA (abdominal aortic aneurysm) Brother    Congestive Heart Failure Brother        s/p ICD   Coronary artery disease Brother        defibrillator; CHF   Kidney cancer Brother        Status post nephrectomy    Allergies  Allergen Reactions   Pneumovax 23 [Pneumococcal Vac Polyvalent] Other (See Comments)    Pt had severe arm pain   Amaryl [Glimepiride] Nausea Only   Cephalexin Hives   Sulfa Antibiotics Rash    Medication list has been reviewed and updated.  Current Outpatient Medications on File Prior to Visit  Medication Sig Dispense Refill   Ascorbic Acid (VITAMIN C) 1000 MG tablet Take 1,000 mg by mouth daily.     atorvastatin (LIPITOR) 20 MG tablet Take 1 tablet (20 mg total) by mouth daily. TAKE ONE TABLET BY MOUTH ONCE  DAILY 90 tablet 0   B Complex-C (SUPER B COMPLEX PO) Take 1 tablet by mouth daily.     blood glucose meter kit and supplies Dispense based on patient and insurance preference. Use up to four times daily as directed. (FOR ICD-10 E10.9, E11.9). 1 each 0   Blood Glucose Monitoring Suppl (ONE TOUCH ULTRA 2) w/Device KIT by Does not apply route.     calcium carbonate (TUMS - DOSED IN MG ELEMENTAL CALCIUM) 500 MG chewable tablet Chew 1 tablet by mouth daily.     Cholecalciferol (VITAMIN D3) 50 MCG (2000 UT) CAPS Take 1 capsule by mouth daily.     cholestyramine (QUESTRAN) 4 g packet Take 1 packet (4 g total) by mouth 3 (three) times daily with meals. Use as needed especially after a heavier meal or high fat food 60 each 12   FARXIGA 10 MG TABS tablet Take 1 tablet (10 mg total) by mouth daily. For [place] and [place] assistance program 90 tablet 3   FLUoxetine (PROZAC) 40 MG capsule Take 1 capsule (40 mg total) by mouth daily. TAKE ONE CAPSULE BY MOUTH ONCE DAILY 90 capsule 0   fluticasone (FLONASE) 50 MCG/ACT nasal spray Place 2 sprays into both nostrils daily. 16 g 1   glucose blood test strip 1 each by Other route as needed for other. Use as instructed. One Touch Ultra 2 100 each 3   meclizine (ANTIVERT) 25 MG tablet Take 1 tablet (25 mg total) by mouth 3 (three) times daily as needed for dizziness. 30 tablet 0   Multiple Vitamins-Minerals (ALIVE WOMENS 50+) TABS Take 1 tablet by mouth daily.     nitroGLYCERIN (NITROSTAT) 0.4 MG SL tablet PLACE 1 TABLET UNDER THE TONGUE EVERY 5 MINUTES AS NEEDED FOR CHEST PAIN 25 tablet 2   omeprazole (PRILOSEC) 20 MG capsule Take 1 capsule (20 mg total) by mouth daily. 90 capsule 0   propranolol ER (INDERAL LA) 60 MG 24 hr capsule TAKE 1 CAPSULE BY MOUTH EVERY DAY 90 capsule 0   Semaglutide, 2 MG/DOSE, (OZEMPIC, 2 MG/DOSE,) 8 MG/3ML SOPN Inject 2 mg into the skin once a week. 3 mL 3   traZODone (DESYREL) 150 MG tablet TAKE ONE TABLET BY MOUTH EVERYDAY AT BEDTIME Dr only  approved 30DS, please call and schedule appt for further refills 30 tablet 2   zinc gluconate 50 MG tablet Take 50 mg by mouth daily.     No current facility-administered medications on file prior to visit.    Review of Systems:  As per HPI- otherwise negative.  Physical Examination: There were no vitals filed for this visit. There were no vitals filed for this visit. There is no height or weight on file to calculate BMI. Ideal Body Weight:    GEN: no acute distress. HEENT: Atraumatic, Normocephalic.  Ears and Nose: No external deformity. CV: RRR, No M/G/R. No JVD. No thrill. No extra heart sounds. PULM: CTA B, no wheezes, crackles, rhonchi. No retractions. No resp. distress. No accessory muscle use. ABD: S, NT, ND, +BS. No rebound. No HSM. EXTR: No c/c/e PSYCH: Normally interactive. Conversant.    Assessment and Plan: ***  Signed Abbe Amsterdam, MD

## 2023-09-18 ENCOUNTER — Ambulatory Visit (INDEPENDENT_AMBULATORY_CARE_PROVIDER_SITE_OTHER): Payer: PPO | Admitting: Family Medicine

## 2023-09-18 VITALS — BP 120/74 | HR 93 | Temp 98.0°F | Resp 18 | Ht 65.0 in | Wt 141.0 lb

## 2023-09-18 DIAGNOSIS — G8929 Other chronic pain: Secondary | ICD-10-CM | POA: Diagnosis not present

## 2023-09-18 DIAGNOSIS — M25561 Pain in right knee: Secondary | ICD-10-CM

## 2023-09-18 DIAGNOSIS — Z23 Encounter for immunization: Secondary | ICD-10-CM

## 2023-09-18 DIAGNOSIS — I10 Essential (primary) hypertension: Secondary | ICD-10-CM

## 2023-09-18 DIAGNOSIS — E1169 Type 2 diabetes mellitus with other specified complication: Secondary | ICD-10-CM

## 2023-09-18 DIAGNOSIS — E785 Hyperlipidemia, unspecified: Secondary | ICD-10-CM

## 2023-09-18 DIAGNOSIS — E119 Type 2 diabetes mellitus without complications: Secondary | ICD-10-CM

## 2023-09-18 DIAGNOSIS — M25562 Pain in left knee: Secondary | ICD-10-CM

## 2023-09-18 NOTE — Patient Instructions (Addendum)
It was good to see you today- flu shot given today Recommend covid booster Try holding your atorvastatin for about 2 weeks; let me know if this helps with your joint pain. If NOT helpful start back on it and we will have you see ortho. If it DOES help we will try a lower dose of statin for you instead I will be in touch with your labs asap  Assuming all is well please see me in about 6 months  Try some voltaren gel for your knee pain

## 2023-09-19 ENCOUNTER — Encounter: Payer: Self-pay | Admitting: Family Medicine

## 2023-09-19 DIAGNOSIS — F329 Major depressive disorder, single episode, unspecified: Secondary | ICD-10-CM

## 2023-09-19 LAB — BASIC METABOLIC PANEL
BUN: 22 mg/dL (ref 6–23)
CO2: 27 meq/L (ref 19–32)
Calcium: 8.8 mg/dL (ref 8.4–10.5)
Chloride: 101 meq/L (ref 96–112)
Creatinine, Ser: 1.56 mg/dL — ABNORMAL HIGH (ref 0.40–1.20)
GFR: 32.66 mL/min — ABNORMAL LOW (ref >60.00)
Glucose, Bld: 140 mg/dL — ABNORMAL HIGH (ref 70–99)
Potassium: 4.1 meq/L (ref 3.5–5.1)
Sodium: 139 meq/L (ref 135–145)

## 2023-09-19 LAB — HEMOGLOBIN A1C: Hgb A1c MFr Bld: 7.6 % — ABNORMAL HIGH (ref 4.6–6.5)

## 2023-09-20 MED ORDER — TRAZODONE HCL 150 MG PO TABS: 150.0000 mg | ORAL_TABLET | Freq: Every day | ORAL | 2 refills | Status: DC

## 2023-09-20 NOTE — Telephone Encounter (Signed)
Received Conditional approval for patient from [employer] for 2025:

## 2023-09-28 ENCOUNTER — Encounter: Payer: Self-pay | Admitting: Neurology

## 2023-09-28 ENCOUNTER — Ambulatory Visit: Payer: PPO | Admitting: Neurology

## 2023-09-28 VITALS — BP 121/73 | HR 92 | Ht 65.0 in | Wt 142.0 lb

## 2023-09-28 DIAGNOSIS — G250 Essential tremor: Secondary | ICD-10-CM

## 2023-09-28 NOTE — Progress Notes (Signed)
Subjective:    Patient ID: Abigail [name] is a 72 y.o. female.  HPI    Interim history:   Abigail [name] is a 71 year old female with an underlying medical history of diabetes, hypertension, hyperlipidemia, mitral valve regurgitation, skin cancer, anemia, anxiety, depression, and chronic kidney disease, who presents for follow-up consultation of her essential tremor.  The patient is unaccompanied today.  I first met her at the request of her primary care physician on 01/31/2023, at which time she reported a longstanding history of hand tremors.  She was advised to restart Inderal, with gradual titration, she was advised to switch to long-acting propranolol 60 mg once daily eventually.  Today, 09/28/2023: She reports feeling more or less stable, sometimes her tremor flares up and other days are better.  She has particular issues when holding something to drink.  She feels that the right hand is worse than the left.  She has been on Inderal long-acting 60 mg once daily and tolerates it.  She does not always hydrate well enough with water, estimates that she drinks about 5 servings of 8 ounce bottles of water per day.  She drinks 1 cup of coffee, 1 cup of tea and 1 soda per day, or decaf.  She has not fallen.  No new symptoms.  She has chronic knee pain and is currently off of her statin to see if that helps.  The patient's allergies, current medications, family history, past medical history, past social history, past surgical history and problem list were reviewed and updated as appropriate.    Previously:   01/31/2023: (She) reports an approximately 10 year Hx of bilateral hand tremors.  Her right hand seems to be worse than the left.  She is right-handed.  She noticed the tremor more so when she was still working as she needed to use the computer a lot and she would make mistakes typing.  It has affected her handwriting and her fine motor skills.  She has recently tapered off propranolol, she did  not find it effective, she admits that she only took a low-dose, she took up to 20 mg once daily, usually only 10 mg once daily.  She has not had much in the way of side effects from propranolol, she has been off of it for a few weeks. Stress can be a trigger she admits.  She does not sleep well, she has been on trazodone for many years, essentially since she lost her sister and brother-in-law in an accident.  She had to increase the trazodone dose over time.  She lost her husband about 3-1/2 years ago, she lost her mom recently in November 2023.  She recalls that her mom had a tremor in her hands but passed away from complications of congestive heart failure.  She has 1 son and 1 daughter, neither 1 has tremors, both have 2 kids each.  Her son lives in town, her daughter lives at [place] and is planning to visit her for a week next week.    She is a non-smoker, she does not currently drink any alcohol. She does not drink caffeine daily.  She admits that she does not drink much in the way of water, she drinks altogether probably 32 ounces of water per day.   I reviewed your office note from 12/15/2022.  She had blood work through your office at the time, including TSH, lipid panel, A1c, CBC, and CMP.  I reviewed the results: Labs were benign or stable, TSH was  1.03, CMP showed blood sugar of 109, creatinine elevated at 1.46, CBC with platelets without differential benign, but A1c was elevated at 7.6 which has been increasing gradually since essentially September 2022.  Lipid panel showed total cholesterol of 172, triglycerides 109, HDL 65, LDL 85.     She has been on Prozac for years, she is currently on 40 mg daily. Of note, she had a head CT without contrast through [facility] health emergency room department at [address] on 08/03/2022 with indication of headache and dizziness, I reviewed the results: Impression: No acute intracranial abnormalities.  Normal brain.   She had a bout of vertigo in October  2023.  She reports that symptoms lasted for about a week and she used meclizine at the time.  She recalls that she was sick at the time with a acute illness.  She has not had any recurrence.   Her Past Medical History Is Significant For: Past Medical History:  Diagnosis Date   Anemia, unspecified    Anxiety    CKD (chronic kidney disease), stage III (HCC)    Depression    Diabetes mellitus type II, non insulin dependent (HCC)    On Ozempic and Farxiga   GERD (gastroesophageal reflux disease)    Hematuria    Hyperlipidemia    Hypertension    Mild mitral regurgitation 08/2017   Essentially normal-mild MR on echo.   Normal coronary arteries    Coronary CTA: No evidence of CAD.  Coronary calcium score 0.   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, [facility]    Her Past Surgical History Is Significant For: Past Surgical History:  Procedure Laterality Date   CHOLECYSTECTOMY N/A 06/03/2015   Procedure: LAPAROSCOPIC CHOLECYSTECTOMY WITH INTRAOPERATIVE CHOLANGIOGRAM;  Surgeon: Manus Rudd, MD;  Location: [facility] OR;  Service: General;  Laterality: N/A;   COLONOSCOPY     GUM SURGERY  2024   gum transplant for recession   HAMMER TOE SURGERY     ORIF ANKLE FRACTURE Left 11/17/2014   Procedure: OPEN REDUCTION INTERNAL FIXATION (ORIF) LEFT TRIMALLEOLAR ANKLE FRACTURE;  Surgeon: Cheral Almas, MD;  Location: [facility] OR;  Service: Orthopedics;  Laterality: Left;   OVARIAN CYST REMOVAL      Her Family History Is Significant For: Family History  Problem Relation Age of Onset   Tremor Mother    Diabetes Mother    Hyperlipidemia Mother    Diabetes Father    Heart disease Father 81       AMI x 2; first AMI age 61   Stroke Father    Hypertension Father    Other Sister 47       Died in a car accident   Hyperlipidemia Brother    AAA (abdominal aortic aneurysm) Brother    Congestive Heart Failure Brother        s/p ICD   Coronary artery disease Brother        defibrillator; CHF    Kidney cancer Brother        Status post nephrectomy   Parkinson's disease Neg Hx     Her Social History Is Significant For: Social History   Socioeconomic History   Marital status: Widowed    Spouse name: Not on file   Number of children: 2   Years of education: Not on file   Highest education level: 12th grade  Occupational History   Occupation: Surveyor, mining    Comment: Retired-Worked at [employer]  Tobacco Use   Smoking status:  Never   Smokeless tobacco: Never  Vaping Use   Vaping status: Never Used  Substance and Sexual Activity   Alcohol use: Yes    Alcohol/week: 1.0 standard drink of alcohol    Types: 1 Glasses of wine per week    Comment: occ   Drug use: No   Sexual activity: Yes    Birth control/protection: None  Other Topics Concern   Not on file  Social History Narrative   Marital status: married x 1970. ->  Now widowed      Children:  2 children; 7 grandchildren.      Employment:  [employer]. ->  Retired      Tobacco: none       Alcohol: sporadic       Exercises: walking 7 days per week.  20-30 minutes.  Walks her dog.   Social Determinants of Health   Financial Resource Strain: Low Risk  (05/31/2023)   Overall Financial Resource Strain (CARDIA)    Difficulty of Paying Living Expenses: Not hard at all  Food Insecurity: No Food Insecurity (05/31/2023)   Hunger Vital Sign    Worried About Running Out of Food in the Last Year: Never true    Ran Out of Food in the Last Year: Never true  Transportation Needs: No Transportation Needs (05/31/2023)   PRAPARE - Administrator, Civil Service (Medical): No    Lack of Transportation (Non-Medical): No  Physical Activity: Insufficiently Active (05/31/2023)   Exercise Vital Sign    Days of Exercise per Week: 1 day    Minutes of Exercise per Session: 20 min  Stress: No Stress Concern Present (05/31/2023)   [employer] of Occupational Health - Occupational Stress  Questionnaire    Feeling of Stress : Only a little  Social Connections: Moderately Integrated (05/31/2023)   Social Connection and Isolation Panel [NHANES]    Frequency of Communication with Friends and Family: More than three times a week    Frequency of Social Gatherings with Friends and Family: Three times a week    Attends Religious Services: More than 4 times per year    Active Member of Clubs or Organizations: Yes    Attends Banker Meetings: More than 4 times per year    Marital Status: Widowed    Her Allergies Are:  Allergies  Allergen Reactions   Pneumovax 23 [Pneumococcal Vac Polyvalent] Other (See Comments)    Pt had severe arm pain   Amaryl [Glimepiride] Nausea Only   Cephalexin Hives   Sulfa Antibiotics Rash  :   Her Current Medications Are:  Outpatient Encounter Medications as of 09/28/2023  Medication Sig   Ascorbic Acid (VITAMIN C) 1000 MG tablet Take 1,000 mg by mouth daily.   B Complex-C (SUPER B COMPLEX PO) Take 1 tablet by mouth daily.   blood glucose meter kit and supplies Dispense based on patient and insurance preference. Use up to four times daily as directed. (FOR ICD-10 E10.9, E11.9).   Blood Glucose Monitoring Suppl (ONE TOUCH ULTRA 2) w/Device KIT by Does not apply route.   calcium carbonate (TUMS - DOSED IN MG ELEMENTAL CALCIUM) 500 MG chewable tablet Chew 1 tablet by mouth daily.   Cholecalciferol (VITAMIN D3) 50 MCG (2000 UT) CAPS Take 1 capsule by mouth daily.   cholestyramine (QUESTRAN) 4 g packet Take 1 packet (4 g total) by mouth 3 (three) times daily with meals. Use as needed especially after a heavier meal or high fat  food   FARXIGA 10 MG TABS tablet Take 1 tablet (10 mg total) by mouth daily. For [place] and [place] assistance program   FLUoxetine (PROZAC) 40 MG capsule Take 1 capsule (40 mg total) by mouth daily. TAKE ONE CAPSULE BY MOUTH ONCE DAILY   fluticasone (FLONASE) 50 MCG/ACT nasal spray Place 2 sprays into both nostrils daily.    glucose blood test strip 1 each by Other route as needed for other. Use as instructed. One Touch Ultra 2   meclizine (ANTIVERT) 25 MG tablet Take 1 tablet (25 mg total) by mouth 3 (three) times daily as needed for dizziness.   Multiple Vitamins-Minerals (ALIVE WOMENS 50+) TABS Take 1 tablet by mouth daily.   nitroGLYCERIN (NITROSTAT) 0.4 MG SL tablet PLACE 1 TABLET UNDER THE TONGUE EVERY 5 MINUTES AS NEEDED FOR CHEST PAIN   omeprazole (PRILOSEC) 20 MG capsule Take 1 capsule (20 mg total) by mouth daily.   propranolol ER (INDERAL LA) 60 MG 24 hr capsule TAKE 1 CAPSULE BY MOUTH EVERY DAY   Semaglutide, 2 MG/DOSE, (OZEMPIC, 2 MG/DOSE,) 8 MG/3ML SOPN Inject 2 mg into the skin once a week.   traZODone (DESYREL) 150 MG tablet Take 1 tablet (150 mg total) by mouth at bedtime.   zinc gluconate 50 MG tablet Take 50 mg by mouth daily.   atorvastatin (LIPITOR) 20 MG tablet Take 1 tablet (20 mg total) by mouth daily. TAKE ONE TABLET BY MOUTH ONCE DAILY (Patient not taking: Reported on 09/28/2023)   No facility-administered encounter medications on file as of 09/28/2023.  :  Review of Systems:  Out of a complete 14 point review of systems, all are reviewed and negative with the exception of these symptoms as listed below: Review of Systems  Neurological:        Pt here for tremors Pt states tremors in right hand is worse      Objective:  Neurological Exam  Physical Exam Physical Examination:   Vitals:   09/28/23 0731  BP: 121/73  Pulse: 92    General Examination: The patient is a very pleasant 72 y.o. female in no acute distress. She appears well-developed and well-nourished and well groomed.   HEENT: Normocephalic, atraumatic, pupils are equal, round and reactive to light, extraocular tracking is good without limitation to gaze excursion or nystagmus noted.  Corrective eyeglasses in place.  She has an intermittent lower jaw and lip tremor, stable.  She has no voice tremor.  Hearing is grossly  intact.  Face is symmetric with normal facial animation, neck is supple with full range of motion, no carotid bruits.  Airway examination reveals mild mouth dryness, otherwise benign findings, tongue protrudes centrally and palate elevates symmetrically.  No head tremor.   Chest: Clear to auscultation without wheezing, rhonchi or crackles noted.   Heart: S1+S2+0, regular and normal without murmurs, rubs or gallops noted.    Abdomen: Soft, non-tender and non-distended.   Extremities: There is no obvious swelling in the distal lower extremities bilaterally.    Skin: Warm and dry without trophic changes noted.    Musculoskeletal: exam reveals no obvious joint deformities.  Reports bilateral knee pain, left more than right.   Neurologically:  Mental status: The patient is awake, alert and oriented in all 4 spheres. Her immediate and remote memory, attention, language skills and fund of knowledge are appropriate. There is no evidence of aphasia, agnosia, apraxia or anomia. Speech is clear with normal prosody and enunciation. Thought process is linear. Mood is normal  and affect is normal.  Cranial nerves II - XII are as described above under HEENT exam.  Motor exam: Normal bulk, strength and tone is noted. There is no obvious resting tremor. She has a mild to moderate postural tremor in both upper extremities, mild action tremor in both upper extremities, no intention tremor.  Postural tremor slightly worse on the right.       (On 01/31/2023: On Archimedes spiral drawing she has coarse trembling with the left hand, mild to moderate trembling with the right hand. Handwriting is tremulous but legible, not micrographic.)   Fine motor skills and coordination: Grossly intact.  Cerebellar testing: No dysmetria or intention tremor. There is no truncal or gait ataxia.  Normal finger-to-nose, normal heel-to-shin bilaterally. Sensory exam: intact to light touch in the upper  and lower extremities.  Reflexes  about 1+ throughout. Gait, station and balance: She stands easily. No veering to one side is noted. No leaning to one side is noted. Posture is age-appropriate and stance is narrow based. Gait shows normal stride length and normal pace. No problems turning are noted.  No walking aid.   Assessment and plan:  In summary, Abigail [name] is a very pleasant 72 year old female with an underlying medical history of diabetes, hypertension, hyperlipidemia, mitral valve regurgitation, skin cancer, anemia, anxiety, depression, knee pain, and chronic kidney disease, who presents for follow-up consultation of her longstanding history of tremors.  History and examination are in keeping with essential tremor.  She is tolerating Inderal LA generic 60 mg once daily.  She has noticed some benefit from the medication.  She is advised to continue with it at the current dose.  I would be cautious in increasing it, she asked about doubling it when needed.  I would advise against it as she could have side effects including lethargy, dizziness, lightheadedness.  She has had vertigo in the past.  She is advised to follow-up in this office as needed at this time and request prescription refills from her primary care physician who had started her on Inderal generic immediate release in the past.  The patient is comfortable with this plan.  She was advised to increase her water intake and continue to limit her caffeine intake.  I answered all her questions today and she was in agreement.   I spent 20 minutes in total face-to-face time and in reviewing records during pre-charting, more than 50% of which was spent in counseling and coordination of care, reviewing test results, reviewing medications and treatment regimen and/or in discussing or reviewing the diagnosis of ET, the prognosis and treatment options. Pertinent laboratory and imaging test results that were available during this visit with the patient were reviewed by me and  considered in my medical decision making (see chart for details).

## 2023-09-28 NOTE — Patient Instructions (Signed)
Please increase your water intake, we recommend about 8 servings of water per day, 8 ounce size each. Continue to limit your caffeine intake. Continue with Inderal LA generic, 60 mg once daily for tremor control, as discussed, you can request further refills from your primary care physician. You can follow-up in our clinic as needed.

## 2023-10-04 NOTE — Telephone Encounter (Signed)
RECEIVED PT PAGES  AND I HAVE  FAXED PROVIDER PAGES OF THE PAP  application for OZEMPIC(NOVO NORDISK) [place]( FARXIGA)  TO OFFICE OF  DR. Shanda Bumps [place]   PLEASE BE ADVISED

## 2023-10-04 NOTE — Progress Notes (Signed)
Pharmacy Medication Assistance Program Note    10/04/2023  Patient ID: Abigail [name], female   DOB: 06-22-1951, 72 y.o.   MRN: [id number]     09/12/2023  Outreach Medication One  Initial Outreach Date (Medication One) 09/12/2023  Manufacturer Medication One Astra Zeneca  [employer] [facility] Farxiga  Dose of Farxiga 10 MG  Type of Assistance Manufacturer Assistance  Date Application Sent to Patient 09/13/2023  Application Items Requested Application;Proof of Income  Name of Prescriber JESSICA COPELAND       Signature    10/04/2023  Patient ID: Abigail [name], female  DOB: 04-17-1951, 72 y.o.  MRN:  [id number]     09/12/2023  Outreach Medication Two  Initial Outreach Date (Medication Two) 09/12/2023  Manufacturer Medication Two Novo Nordisk  Nordisk Drugs Ozempic  Dose of Ozempic 2 MG  Type of Radiographer, therapeutic Assistance  Date Application Sent to Patient 09/13/2023  Name of Prescriber JESSICA, [employer]       Signature

## 2023-10-04 NOTE — Telephone Encounter (Signed)
RECEIVED PT PAGES  AND I HAVE FAXED PROVIDER PAGES OF THE PAP application for OZEMPIC(NOVO NORDISK) [place]( FARXIGA) TO OFFICE OF  DR. Shanda Bumps [place]  PLEASE BE ADVISED

## 2023-10-16 ENCOUNTER — Other Ambulatory Visit: Payer: Self-pay | Admitting: Family Medicine

## 2023-10-16 DIAGNOSIS — F329 Major depressive disorder, single episode, unspecified: Secondary | ICD-10-CM

## 2023-10-17 NOTE — Telephone Encounter (Signed)
PAP: Application for Ozempic has been submitted to PAP Companies: NovoNordisk, via fax   PLEASE BE ADVISED

## 2023-10-19 NOTE — Telephone Encounter (Signed)
PAP: Application for Ozempic has been submitted to PAP Companies: NovoNordisk, via fax   PLEASE BE ADVISED

## 2023-10-19 NOTE — Telephone Encounter (Signed)
PAP: Application for Marcelline Deist has been submitted to PAP Companies: AZ&ME, via fax    PLEASE BE ADVISED

## 2023-10-26 NOTE — Telephone Encounter (Signed)
PAP: RESUBMITTED SECTION  C OF Application for Ozempic has been NovoNordisk, via fax MISSING SIGNATURE    PLEASE BE ADVISED APP IS PENDING DUE TO THIS

## 2023-11-06 ENCOUNTER — Other Ambulatory Visit: Payer: Self-pay | Admitting: Family Medicine

## 2023-11-06 ENCOUNTER — Other Ambulatory Visit: Payer: Self-pay | Admitting: Neurology

## 2023-11-06 DIAGNOSIS — K219 Gastro-esophageal reflux disease without esophagitis: Secondary | ICD-10-CM

## 2023-11-06 DIAGNOSIS — F329 Major depressive disorder, single episode, unspecified: Secondary | ICD-10-CM

## 2023-11-06 DIAGNOSIS — F4323 Adjustment disorder with mixed anxiety and depressed mood: Secondary | ICD-10-CM

## 2023-11-06 DIAGNOSIS — E785 Hyperlipidemia, unspecified: Secondary | ICD-10-CM

## 2023-11-15 NOTE — Telephone Encounter (Signed)
 PLEASE BE ADVISE PT NEED A SCRIPT SENT IN THROUGH [facility] FOR FARXIGA (AZ&ME) I HAVE ADDED IT TO PT PREFERRED PHARMACY LIST  IN SNAPSHOT

## 2023-11-15 NOTE — Telephone Encounter (Signed)
 PAP: Patient assistance application Ozempic  for has been approved by PAP Companies: NovoNordisk from 11/09/2023 to 11/02/2024. Medication should be delivered to PAP Delivery: Provider's office For further shipping updates, please contact Novo Nordisk at [phone] Pt ID is: [id number]  PLEASE BE ADVISED

## 2023-11-16 ENCOUNTER — Other Ambulatory Visit: Payer: Self-pay

## 2023-11-16 MED ORDER — FARXIGA 10 MG PO TABS: 10.0000 mg | ORAL_TABLET | Freq: Every day | ORAL | 3 refills | Status: DC

## 2023-11-16 NOTE — Telephone Encounter (Signed)
 Rx was sent to the [facility] pharmacy.

## 2023-12-08 NOTE — Telephone Encounter (Signed)
PAP: Patient assistance application for Abigail [name] has been approved by PAP Companies: [place] from 11/08/2023 to 11/06/2024. Medication should be delivered to PAP Delivery: Home. For further shipping updates, please contact A[employer]Zen[employer] (AZ&Me) at [phone]. Patient ID is: NO ID    PLEASE BE ADVISED SHIPMENT WAS 11/23/2023 AND PT SHOULD HAVE RECEIVED ON 11/29/2023 PER AZ&ME

## 2023-12-08 NOTE — Telephone Encounter (Signed)
Verified thru [employer] medication assistance program that patient has been approved to receive Ozempic 2mg  thru 11/02/2024 - first fill for 2025 is in process.

## 2023-12-19 ENCOUNTER — Telehealth: Payer: Self-pay

## 2023-12-19 ENCOUNTER — Other Ambulatory Visit: Payer: Self-pay | Admitting: Neurology

## 2023-12-19 NOTE — Telephone Encounter (Signed)
Spoke w/ Pt- made her aware that Ozempic is here.

## 2023-12-20 ENCOUNTER — Other Ambulatory Visit: Payer: Self-pay | Admitting: Family Medicine

## 2023-12-20 MED ORDER — PROPRANOLOL HCL ER 60 MG PO CP24: 60.0000 mg | ORAL_CAPSULE | Freq: Every day | ORAL | 1 refills | Status: DC

## 2023-12-20 NOTE — Telephone Encounter (Signed)
Last seen on 09/28/23   Per note " Continue with Inderal LA generic, 60 mg once daily for tremor control, as discussed, you can request further refills from your primary care physician "

## 2023-12-20 NOTE — Telephone Encounter (Signed)
Copied from CRM [id number]. Topic: Clinical - Medication Refill >> Dec 20, 2023 10:47 AM [place] J wrote: Most Recent Primary Care Visit:  Provider: Pearline Cables  Department: [facility]  Visit Type: OFFICE VISIT  Date: 09/18/2023  Medication: propranolol ER (INDERAL LA) 60 MG  Has the patient contacted their pharmacy? Yes (Agent: If no, request that the patient contact the pharmacy for the refill. If patient does not wish to contact the pharmacy document the reason why and proceed with request.) (Agent: If yes, when and what did the pharmacy advise?)  Is this the correct pharmacy for this prescription? Yes If no, delete pharmacy and type the correct one.  This is the patient's preferred pharmacy:  [id number] - [place], [place] - [address] [place] [address] Phone: [phone] Fax: [phone]  [facility] - [place], [place] - [address] [place] [address] Phone: [phone] Fax: [phone]   Has the prescription been filled recently? No  Is the patient out of the medication? Yes  Has the patient been seen for an appointment in the last year OR does the patient have an upcoming appointment? Yes  Can we respond through MyChart? Yes  Agent: Please be advised that Rx refills may take up to 3 business days. We ask that you follow-up with your pharmacy.

## 2023-12-20 NOTE — Telephone Encounter (Signed)
Last Fill: 09/11/23  Last OV: 09/18/23 Next OV: None Scheduled  Routing to provider for review/authorization.

## 2023-12-21 DIAGNOSIS — L814 Other melanin hyperpigmentation: Secondary | ICD-10-CM | POA: Diagnosis not present

## 2023-12-21 DIAGNOSIS — C44729 Squamous cell carcinoma of skin of left lower limb, including hip: Secondary | ICD-10-CM | POA: Diagnosis not present

## 2023-12-21 DIAGNOSIS — L821 Other seborrheic keratosis: Secondary | ICD-10-CM | POA: Diagnosis not present

## 2023-12-21 DIAGNOSIS — Z85828 Personal history of other malignant neoplasm of skin: Secondary | ICD-10-CM | POA: Diagnosis not present

## 2023-12-21 DIAGNOSIS — D485 Neoplasm of uncertain behavior of skin: Secondary | ICD-10-CM | POA: Diagnosis not present

## 2023-12-31 ENCOUNTER — Other Ambulatory Visit: Payer: Self-pay | Admitting: Family Medicine

## 2023-12-31 DIAGNOSIS — E119 Type 2 diabetes mellitus without complications: Secondary | ICD-10-CM

## 2024-01-09 DIAGNOSIS — Z860100 Personal history of colon polyps, unspecified: Secondary | ICD-10-CM | POA: Diagnosis not present

## 2024-01-09 DIAGNOSIS — D124 Benign neoplasm of descending colon: Secondary | ICD-10-CM | POA: Diagnosis not present

## 2024-01-09 DIAGNOSIS — Z09 Encounter for follow-up examination after completed treatment for conditions other than malignant neoplasm: Secondary | ICD-10-CM | POA: Diagnosis not present

## 2024-01-09 DIAGNOSIS — K5730 Diverticulosis of large intestine without perforation or abscess without bleeding: Secondary | ICD-10-CM | POA: Diagnosis not present

## 2024-01-09 LAB — HM COLONOSCOPY

## 2024-01-30 DIAGNOSIS — Z85828 Personal history of other malignant neoplasm of skin: Secondary | ICD-10-CM | POA: Diagnosis not present

## 2024-01-30 DIAGNOSIS — C44729 Squamous cell carcinoma of skin of left lower limb, including hip: Secondary | ICD-10-CM | POA: Diagnosis not present

## 2024-02-27 DIAGNOSIS — I129 Hypertensive chronic kidney disease with stage 1 through stage 4 chronic kidney disease, or unspecified chronic kidney disease: Secondary | ICD-10-CM | POA: Diagnosis not present

## 2024-02-27 DIAGNOSIS — E1122 Type 2 diabetes mellitus with diabetic chronic kidney disease: Secondary | ICD-10-CM | POA: Diagnosis not present

## 2024-02-27 DIAGNOSIS — N1832 Chronic kidney disease, stage 3b: Secondary | ICD-10-CM | POA: Diagnosis not present

## 2024-02-29 NOTE — Progress Notes (Signed)
 [facility] at [facility] [address] [place], [place] [address] [phone]  Date:  03/04/2024   Name:  Abigail [name]   DOB:  09/26/1951   MRN:  [id number]  PCP:  Kaylee Partridge, MD    Chief Complaint: Gastroesophageal Reflux (Pt noticed pain in the center of her chest that radiates into her back x 2-3 weeks. She started taking an extra Omeprazole  and this has helped. )   History of Present Illness:  Abigail [name] is a 73 y.o. very pleasant female patient who presents with the following:  Patient seen today with concern of ?gastric reflux- chest pain Most recent visit with myself was in November History of diabetes, hyperlipidemia, hypertension, chronic renal insufficiency with nephrology follow-up, essential tremor seen by neurology   We did some labs in November-BMP, A1c She has noted pain in her chest "through to my back" for 2-3 weeks The pain will come and go- it may be worse after she eats but not always.  She has tried to avoid greasy foods She is feeling more SOB with exercise than she typically would- she has noticed this for 3 weeks or so. She is getting winded walking her dog which is not typical for her  She notes that they recently went to the airport and had to run from one end to the other and this caused SOB and some CP as well.  This occurred about 3 weeks ago   She went up on her omeprazole  to 2 pills and this did seem to help somewhat  Lab Results  Component Value Date   HGBA1C 7.6 (H) 09/18/2023   Lipitor 20 Farxiga  10  fluoxetine  40 Ozempic  2 mg Omeprazole  20 daily Propranolol  Trazodone  at bedtime  CT coronary morphology done 2018 IMPRESSION: 1. Coronary calcium  score of 0. This was 0 percentile for age and sex matched control. 2. Normal coronary origin with right dominance. 3. No evidence of CAD.  Consider non-cardiac sources of chest pain.  She also had an echo in 2018: Left  ventricle: The cavity size was normal. Systolic function was    normal. The estimated ejection fraction was in the range of 55%    to 60%. Wall motion was normal; there were no regional wall    motion abnormalities. Left ventricular diastolic function    parameters were normal.  - Mitral valve: There was mild regurgitation.     As above she had a cardiac work up in 2018 but none more recently  Patient Active Problem List   Diagnosis Date Noted   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/29/2020   Essential hypertension    Gastroesophageal reflux disease without esophagitis    Microscopic hematuria 09/16/2015   Trimalleolar fracture of ankle, closed 11/17/2014   Hyperlipidemia associated with type 2 diabetes mellitus (HCC) 07/24/2013   Chest pain 10/23/2012   Type 2 diabetes mellitus without complication, without long-term current use of insulin  (HCC) 10/08/2012   Anxiety 10/08/2012    Past Medical History:  Diagnosis Date   Anemia, unspecified    Anxiety    CKD (chronic kidney disease), stage III (HCC)    Depression    Diabetes mellitus type II, non insulin  dependent (HCC)    On Ozempic  and Farxiga    GERD (gastroesophageal reflux disease)    Hematuria    Hyperlipidemia    Hypertension    Mild mitral regurgitation 08/2017   Essentially normal-mild MR on echo.   Normal coronary arteries  Coronary CTA: No evidence of CAD.  Coronary calcium  score 0.   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   HAMMER TOE SURGERY     ORIF ANKLE FRACTURE Left 11/17/2014   Procedure: OPEN REDUCTION INTERNAL FIXATION (ORIF) LEFT TRIMALLEOLAR ANKLE FRACTURE;  Surgeon: Edison Gore, MD;   Location: [facility] OR;  Service: Orthopedics;  Laterality: Left;   OVARIAN CYST REMOVAL      Social History   Tobacco Use   Smoking status: Never   Smokeless tobacco: Never  Vaping Use   Vaping status: Never Used  Substance Use Topics   Alcohol use: Yes    Alcohol/week: 1.0 standard drink of alcohol    Types: 1 Glasses of wine per week    Comment: occ   Drug use: No    Family History  Problem Relation Age of Onset   Tremor Mother    Diabetes Mother    Hyperlipidemia Mother    Diabetes Father    Heart disease Father 80       AMI x 2; first AMI age 14   Stroke Father    Hypertension Father    Other Sister 30       Died in a car accident   Hyperlipidemia Brother    AAA (abdominal aortic aneurysm) Brother    Congestive Heart Failure Brother        s/p ICD   Coronary artery disease Brother        defibrillator; CHF   Kidney cancer Brother        Status post nephrectomy   Parkinson's disease Neg Hx     Allergies  Allergen Reactions   Pneumovax 23 [Pneumococcal Vac Polyvalent] Other (See Comments)    Pt had severe arm pain   Amaryl  [Glimepiride ] Nausea Only   Cephalexin Hives   Sulfa Antibiotics Rash    Medication list has been reviewed and updated.  Current Outpatient Medications on File Prior to Visit  Medication Sig Dispense Refill   Ascorbic Acid (VITAMIN C) 1000 MG tablet Take 1,000 mg by mouth daily.     atorvastatin  (LIPITOR) 20 MG tablet TAKE 1 TABLET (20 MG TOTAL) BY MOUTH DAILY. 90 tablet 0   B Complex-C (SUPER B COMPLEX PO) Take 1 tablet by mouth daily.     blood glucose meter kit and supplies Dispense based on patient and insurance preference. Use up to four times daily as directed. (FOR ICD-10 E10.9, E11.9). 1 each 0   Blood Glucose Monitoring Suppl (ONE TOUCH ULTRA 2) w/Device KIT by Does not apply route.     Cholecalciferol (VITAMIN D3) 50 MCG (2000 UT) CAPS Take 1 capsule by mouth daily.     cholestyramine  (QUESTRAN ) 4 g packet Take 1 packet (4 g total)  by mouth 3 (three) times daily with meals. Use as needed especially after a heavier meal or high fat food 60 each 12   FARXIGA  10 MG TABS tablet Take 1 tablet (10 mg total) by mouth daily. For [place] and [place] assistance program 90 tablet 3   FLUoxetine  (PROZAC ) 40 MG capsule TAKE 1 CAPSULE (40 MG TOTAL) BY MOUTH DAILY. 90 capsule 0  fluticasone  (FLONASE ) 50 MCG/ACT nasal spray Place 2 sprays into both nostrils daily. 16 g 1   glucose blood test strip 1 each by Other route as needed for other. Use as instructed. One Touch Ultra 2 100 each 3   meclizine  (ANTIVERT ) 25 MG tablet Take 1 tablet (25 mg total) by mouth 3 (three) times daily as needed for dizziness. 30 tablet 0   Multiple Vitamins-Minerals (ALIVE WOMENS 50+) TABS Take 1 tablet by mouth daily.     nitroGLYCERIN  (NITROSTAT ) 0.4 MG SL tablet PLACE 1 TABLET UNDER THE TONGUE EVERY 5 MINUTES AS NEEDED FOR CHEST PAIN 25 tablet 2   omeprazole  (PRILOSEC) 20 MG capsule Take 1 capsule (20 mg total) by mouth daily. 90 capsule 1   propranolol  ER (INDERAL  LA) 60 MG 24 hr capsule Take 1 capsule (60 mg total) by mouth daily. 90 capsule 1   Semaglutide , 2 MG/DOSE, (OZEMPIC , 2 MG/DOSE,) 8 MG/3ML SOPN INJECT 2 MG INTO THE SKIN ONCE A WEEK. 3 mL 3   traZODone  (DESYREL ) 150 MG tablet TAKE 1 TABLET BY MOUTH AT BEDTIME. 90 tablet 0   zinc gluconate 50 MG tablet Take 50 mg by mouth daily.     No current facility-administered medications on file prior to visit.    Review of Systems:  As per HPI- otherwise negative.   Physical Examination: Vitals:   03/04/24 1545  BP: 124/80  Pulse: 83  Resp: 18  Temp: 97.8 F (36.6 C)  SpO2: 99%   Vitals:   03/04/24 1545  Weight: 136 lb (61.7 kg)  Height: 5\' 5"  (1.651 m)   Body mass index is 22.63 kg/m. Ideal Body Weight: Weight in (lb) to have BMI = 25: 149.9  GEN: no acute distress. Normal weight, looks well  HEENT: Atraumatic, Normocephalic.  Ears and Nose: No external deformity. CV: RRR, No M/G/R. No JVD.  No thrill. No extra heart sounds. PULM: CTA B, no wheezes, crackles, rhonchi. No retractions. No resp. distress. No accessory muscle use. ABD: S, NT, ND, +BS. No rebound. No HSM. EXTR: No c/c/e PSYCH: Normally interactive. Conversant.  I am able to cause discomfort by pressing on her chest wall, but pt is not sure if this is the pain she has been experiencing   EKG: new/ progressed left BBB which was not present 10/23  Assessment and Plan: SOB (shortness of breath) - Plan: Hemoglobin A1c, EKG 12-Lead, CANCELED: CBC, CANCELED: Comprehensive metabolic panel with GFR, CANCELED: Troponin I (High Sensitivity), CANCELED: D-Dimer, Quantitative, CANCELED: Troponin I  Pt seen today with chest pain and exertional SOB for 2-3 weeks.  May be non- cardiac/ GERD but her story is concerning Sent to ER here at the [facility] for further evaluation now- appreciate ER care of this nice patient I canceled labs except for her A1c    Signed Gates Kasal, MD  Addendum/29, received her lab work.  Patient was able to be discharged home from the ER.  Message to patient Results for orders placed or performed in visit on 03/04/24  Hemoglobin A1c   Collection Time: 03/04/24  4:18 PM  Result Value Ref Range   Hgb A1c MFr Bld 7.9 (H) 4.6 - 6.5 %

## 2024-03-04 ENCOUNTER — Encounter (HOSPITAL_BASED_OUTPATIENT_CLINIC_OR_DEPARTMENT_OTHER): Payer: Self-pay

## 2024-03-04 ENCOUNTER — Other Ambulatory Visit: Payer: Self-pay

## 2024-03-04 ENCOUNTER — Emergency Department (HOSPITAL_BASED_OUTPATIENT_CLINIC_OR_DEPARTMENT_OTHER)
Admission: EM | Admit: 2024-03-04 | Discharge: 2024-03-04 | Disposition: A | Discharge status: Home / Self Care | Attending: Emergency Medicine | Admitting: Emergency Medicine

## 2024-03-04 ENCOUNTER — Ambulatory Visit (INDEPENDENT_AMBULATORY_CARE_PROVIDER_SITE_OTHER): Admitting: Family Medicine

## 2024-03-04 ENCOUNTER — Emergency Department (HOSPITAL_BASED_OUTPATIENT_CLINIC_OR_DEPARTMENT_OTHER)

## 2024-03-04 VITALS — BP 124/80 | HR 83 | Temp 97.8°F | Resp 18 | Ht 65.0 in | Wt 136.0 lb

## 2024-03-04 DIAGNOSIS — R0602 Shortness of breath: Secondary | ICD-10-CM | POA: Diagnosis not present

## 2024-03-04 DIAGNOSIS — Z85828 Personal history of other malignant neoplasm of skin: Secondary | ICD-10-CM | POA: Insufficient documentation

## 2024-03-04 DIAGNOSIS — I129 Hypertensive chronic kidney disease with stage 1 through stage 4 chronic kidney disease, or unspecified chronic kidney disease: Secondary | ICD-10-CM | POA: Insufficient documentation

## 2024-03-04 DIAGNOSIS — R110 Nausea: Secondary | ICD-10-CM | POA: Insufficient documentation

## 2024-03-04 DIAGNOSIS — E1122 Type 2 diabetes mellitus with diabetic chronic kidney disease: Secondary | ICD-10-CM | POA: Diagnosis not present

## 2024-03-04 DIAGNOSIS — R079 Chest pain, unspecified: Secondary | ICD-10-CM | POA: Diagnosis not present

## 2024-03-04 DIAGNOSIS — N183 Chronic kidney disease, stage 3 unspecified: Secondary | ICD-10-CM | POA: Insufficient documentation

## 2024-03-04 DIAGNOSIS — R0789 Other chest pain: Secondary | ICD-10-CM | POA: Insufficient documentation

## 2024-03-04 DIAGNOSIS — R9431 Abnormal electrocardiogram [ECG] [EKG]: Secondary | ICD-10-CM | POA: Diagnosis not present

## 2024-03-04 LAB — BASIC METABOLIC PANEL WITH GFR
Anion gap: 16 — ABNORMAL HIGH (ref 5–15)
BUN: 23 mg/dL (ref 8–23)
CO2: 20 mmol/L — ABNORMAL LOW (ref 22–32)
Calcium: 9.2 mg/dL (ref 8.9–10.3)
Chloride: 100 mmol/L (ref 98–111)
Creatinine, Ser: 1.43 mg/dL — ABNORMAL HIGH (ref 0.44–1.00)
GFR, Estimated: 38 mL/min — ABNORMAL LOW (ref >60)
Glucose, Bld: 110 mg/dL — ABNORMAL HIGH (ref 70–99)
Potassium: 4.1 mmol/L (ref 3.5–5.1)
Sodium: 135 mmol/L (ref 135–145)

## 2024-03-04 LAB — TROPONIN T, HIGH SENSITIVITY: Troponin T High Sensitivity: <15 ng/L (ref <19)

## 2024-03-04 LAB — CBC
HCT: 41.6 % (ref 36.0–46.0)
Hemoglobin: 13.2 g/dL (ref 12.0–15.0)
MCH: 27.4 pg (ref 26.0–34.0)
MCHC: 31.7 g/dL (ref 30.0–36.0)
MCV: 86.5 fL (ref 80.0–100.0)
Platelets: 180 10*3/uL (ref 150–400)
RBC: 4.81 MIL/uL (ref 3.87–5.11)
RDW: 13.2 % (ref 11.5–15.5)
WBC: 7 10*3/uL (ref 4.0–10.5)
nRBC: 0 % (ref 0.0–0.2)

## 2024-03-04 MED ORDER — ALUM & MAG HYDROXIDE-SIMETH 200-200-20 MG/5ML PO SUSP
30.0000 mL | Freq: Once | ORAL | Status: AC
  Administered 2024-03-04: 30 mL via ORAL
  Filled 2024-03-04: qty 30

## 2024-03-04 MED ORDER — LIDOCAINE VISCOUS HCL 2 % MT SOLN
15.0000 mL | Freq: Once | OROMUCOSAL | Status: AC
Start: 2024-03-04 — End: 2024-03-04
  Administered 2024-03-04: 15 mL via ORAL
  Filled 2024-03-04: qty 15

## 2024-03-04 NOTE — ED Provider Notes (Signed)
 [place] [facility] AT [facility] Provider Note  CSN: [id number] Arrival date & time: 03/04/24 1651  Chief Complaint(s) Chest Pain  HPI Abigail [name] is a 73 y.o. female history of diabetes, CKD, hypertension, hyperlipidemia presenting to the emergency department chest pain.  Patient reports intermittent chest pain for the past 2 or 3 weeks.  Comes and goes.  No specific trigger.  Sometimes goes into her back.  No pain in the arms or legs.  No headache.  No shortness of breath.  No fevers or chills.  She reports some mild nausea occasionally.  She felt this was possibly related to her chronic GERD so she started taking extra dose of omeprazole  which may have helped some.   Past Medical History Past Medical History:  Diagnosis Date   Anemia, unspecified    Anxiety    CKD (chronic kidney disease), stage III (HCC)    Depression    Diabetes mellitus type II, non insulin  dependent (HCC)    On Ozempic  and Farxiga    GERD (gastroesophageal reflux disease)    Hematuria    Hyperlipidemia    Hypertension    Mild mitral regurgitation 08/2017   Essentially normal-mild MR on echo.   Normal coronary arteries    Coronary CTA: No evidence of CAD.  Coronary calcium  score 0.   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, [employer]   Patient Active Problem List   Diagnosis Date Noted   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/29/2020   Essential hypertension    Gastroesophageal reflux disease without esophagitis    Microscopic hematuria 09/16/2015   Trimalleolar fracture of ankle, closed 11/17/2014   Hyperlipidemia associated with type 2 diabetes mellitus (HCC) 07/24/2013   Chest pain 10/23/2012   Type 2 diabetes mellitus without complication, without long-term current use of insulin  (HCC) 10/08/2012   Anxiety 10/08/2012   Home Medication(s) Prior to Admission medications   Medication Sig Start Date End Date Taking? Authorizing Provider  Ascorbic Acid (VITAMIN C) 1000 MG tablet Take  1,000 mg by mouth daily.    [provider]  atorvastatin  (LIPITOR) 20 MG tablet TAKE 1 TABLET (20 MG TOTAL) BY MOUTH DAILY. 11/06/23   Copland, Jessica C, MD  B Complex-C (SUPER B COMPLEX PO) Take 1 tablet by mouth daily.    [provider]  blood glucose meter kit and supplies Dispense based on patient and insurance preference. Use up to four times daily as directed. (FOR ICD-10 E10.9, E11.9). 07/02/19   Copland, Jessica C, MD  Blood Glucose Monitoring Suppl (ONE TOUCH ULTRA 2) w/Device KIT by Does not apply route.    [provider]  Cholecalciferol (VITAMIN D3) 50 MCG (2000 UT) CAPS Take 1 capsule by mouth daily.    [provider]  cholestyramine  (QUESTRAN ) 4 g packet Take 1 packet (4 g total) by mouth 3 (three) times daily with meals. Use as needed especially after a heavier meal or high fat food 06/07/23   Copland, Skipper Dumas, MD  FARXIGA  10 MG TABS tablet Take 1 tablet (10 mg total) by mouth daily. For [place] and Me assistance program 11/16/23   Copland, Skipper Dumas, MD  FLUoxetine  (PROZAC ) 40 MG capsule TAKE 1 CAPSULE (40 MG TOTAL) BY MOUTH DAILY. 11/06/23   Copland, Skipper Dumas, MD  fluticasone  (FLONASE ) 50 MCG/ACT nasal spray Place 2 sprays into both nostrils daily. 03/15/21   Saguier, Gaylin Ke, PA-C  glucose blood test strip 1 each by Other route as needed for other. Use as instructed. One  Touch Ultra 2 05/17/23   Copland, Skipper Dumas, MD  meclizine  (ANTIVERT ) 25 MG tablet Take 1 tablet (25 mg total) by mouth 3 (three) times daily as needed for dizziness. 08/05/22   Copland, Skipper Dumas, MD  Multiple Vitamins-Minerals (ALIVE WOMENS 50+) TABS Take 1 tablet by mouth daily.    [provider]  nitroGLYCERIN  (NITROSTAT ) 0.4 MG SL tablet PLACE 1 TABLET UNDER THE TONGUE EVERY 5 MINUTES AS NEEDED FOR CHEST PAIN 05/05/20   Copland, Skipper Dumas, MD  omeprazole  (PRILOSEC) 20 MG capsule Take 1 capsule (20 mg total) by mouth daily. 11/06/23   Copland, Skipper Dumas, MD  propranolol  ER  (INDERAL  LA) 60 MG 24 hr capsule Take 1 capsule (60 mg total) by mouth daily. 12/20/23   Copland, Skipper Dumas, MD  Semaglutide , 2 MG/DOSE, (OZEMPIC , 2 MG/DOSE,) 8 MG/3ML SOPN INJECT 2 MG INTO THE SKIN ONCE A WEEK. 01/02/24   Copland, Skipper Dumas, MD  traZODone  (DESYREL ) 150 MG tablet TAKE 1 TABLET BY MOUTH AT BEDTIME. 11/06/23   Copland, Jessica C, MD  zinc gluconate 50 MG tablet Take 50 mg by mouth daily.    [provider]                                                                                                                                    Past Surgical History Past Surgical History:  Procedure Laterality Date   CHOLECYSTECTOMY N/A 06/03/2015   Procedure: LAPAROSCOPIC CHOLECYSTECTOMY WITH INTRAOPERATIVE CHOLANGIOGRAM;  Surgeon: Dareen Ebbing, MD;  Location: [facility] OR;  Service: General;  Laterality: N/A;   COLONOSCOPY     GUM SURGERY  2024   gum transplant for recession   HAMMER TOE SURGERY     ORIF ANKLE FRACTURE Left 11/17/2014   Procedure: OPEN REDUCTION INTERNAL FIXATION (ORIF) LEFT TRIMALLEOLAR ANKLE FRACTURE;  Surgeon: Edison Gore, MD;  Location: [facility] OR;  Service: Orthopedics;  Laterality: Left;   OVARIAN CYST REMOVAL     Family History Family History  Problem Relation Age of Onset   Tremor Mother    Diabetes Mother    Hyperlipidemia Mother    Diabetes Father    Heart disease Father 65       AMI x 2; first AMI age 39   Stroke Father    Hypertension Father    Other Sister 57       Died in a car accident   Hyperlipidemia Brother    AAA (abdominal aortic aneurysm) Brother    Congestive Heart Failure Brother        s/p ICD   Coronary artery disease Brother        defibrillator; CHF   Kidney cancer Brother        Status post nephrectomy   Parkinson's disease Neg Hx     Social History Social History   Tobacco Use   Smoking status: Never   Smokeless tobacco: Never  Vaping Use  Vaping status: Never Used  Substance Use Topics   Alcohol use: Yes     Alcohol/week: 1.0 standard drink of alcohol    Types: 1 Glasses of wine per week    Comment: occ   Drug use: No   Allergies Pneumovax 23 [pneumococcal vac polyvalent], Amaryl  [glimepiride ], Cephalexin, and Sulfa antibiotics  Review of Systems Review of Systems  All other systems reviewed and are negative.   Physical Exam Vital Signs  I have reviewed the triage vital signs BP (!) 148/93 (BP Location: Left Arm)   Pulse 81   Temp 98.4 F (36.9 C)   Resp 20   Ht 5\' 5"  (1.651 m)   Wt 61.7 kg   SpO2 99%   BMI 22.63 kg/m  Physical Exam Vitals and nursing note reviewed.  Constitutional:      General: She is not in acute distress.    Appearance: She is well-developed.  HENT:     Head: Normocephalic and atraumatic.     Mouth/Throat:     Mouth: Mucous membranes are moist.  Eyes:     Pupils: Pupils are equal, round, and reactive to light.  Cardiovascular:     Rate and Rhythm: Normal rate and regular rhythm.     Pulses:          Radial pulses are 2+ on the right side and 2+ on the left side.       Dorsalis pedis pulses are 2+ on the right side and 2+ on the left side.     Heart sounds: No murmur heard. Pulmonary:     Effort: Pulmonary effort is normal. No respiratory distress.     Breath sounds: Normal breath sounds.  Abdominal:     General: Abdomen is flat.     Palpations: Abdomen is soft.     Tenderness: There is no abdominal tenderness.  Musculoskeletal:        General: No tenderness.     Right lower leg: No edema.     Left lower leg: No edema.  Skin:    General: Skin is warm and dry.  Neurological:     General: No focal deficit present.     Mental Status: She is alert. Mental status is at baseline.  Psychiatric:        Mood and Affect: Mood normal.        Behavior: Behavior normal.     ED Results and Treatments Labs (all labs ordered are listed, but only abnormal results are displayed) Labs Reviewed  BASIC METABOLIC PANEL WITH GFR - Abnormal; Notable for  the following components:      Result Value   CO2 20 (*)    Glucose, Bld 110 (*)    Creatinine, Ser 1.43 (*)    GFR, Estimated 38 (*)    Anion gap 16 (*)    All other components within normal limits  CBC  TROPONIN T, HIGH SENSITIVITY  Radiology DG Chest 2 View Result Date: 03/04/2024 CLINICAL DATA:  Chest pain radiating to the back.  EKG changes. EXAM: CHEST - 2 VIEW COMPARISON:  08/31/2022 FINDINGS: Normal heart size and pulmonary vascularity. No focal airspace disease or consolidation in the lungs. No blunting of costophrenic angles. No pneumothorax. Mediastinal contours appear intact. Surgical clips in the right upper quadrant. IMPRESSION: No active cardiopulmonary disease. Electronically Signed   By: Boyce Byes M.D.   On: 03/04/2024 18:10    Pertinent labs & imaging results that were available during my care of the patient were reviewed by me and considered in my medical decision making (see MDM for details).  Medications Ordered in ED Medications  alum & mag hydroxide-simeth (MAALOX/MYLANTA) 200-200-20 MG/5ML suspension 30 mL (30 mLs Oral Given 03/04/24 1851)    And  lidocaine  (XYLOCAINE ) 2 % viscous mouth solution 15 mL (15 mLs Oral Given 03/04/24 1851)                                                                                                                                     Procedures Procedures  (including critical care time)  Medical Decision Making / ED Course   MDM:  73 year old presenting to the emergency department with chest pain.  Patient overall well-appearing, physical examination with no focal abnormality.  Pulses normal.  Unclear cause of chest pain, could be GERD related.  Did report some improvement after receiving omeprazole .  Considered alternative cause, such as ACS, patient had prior coronary CT scan which was negative  although this was around 7 years ago, will check troponin.  EKG shows chronic left bundle branch block on my interpretation no change from prior.  Considered other process such as pulmonary embolism, no pleuritic pain, no hypoxia, no shortness of breath.  Also considered dissection given radiation of the back however symptoms are extremely atypical having been going on for 3 weeks and being mild and intermittent, and she has normal pulses, no mediastinal widening.  If troponin is negative, anticipate discharge with outpatient follow-up, can place referral to cardiology given age  Clinical Course as of 03/04/24 1948  Mon Mar 04, 2024  1946 Patient reports that Maalox improved her symptoms.  Suspect likely GERD related.  Workup is otherwise reassuring with normal troponin.  Labs with stable CKD.  Will place cardiology referral. Will discharge patient to home. All questions answered. Patient comfortable with plan of discharge. Return precautions discussed with patient and specified on the after visit summary.  [WS]    Clinical Course User Index [WS] Mordecai Applebaum, MD     Additional history obtained:  -External records from outside source obtained and reviewed including: Chart review including previous notes, labs, imaging, consultation notes including prior labs and PMD note    Lab Tests: -I ordered, reviewed, and interpreted labs.   The pertinent results include:   Labs Reviewed  BASIC METABOLIC PANEL WITH GFR -  Abnormal; Notable for the following components:      Result Value   CO2 20 (*)    Glucose, Bld 110 (*)    Creatinine, Ser 1.43 (*)    GFR, Estimated 38 (*)    Anion gap 16 (*)    All other components within normal limits  CBC  TROPONIN T, HIGH SENSITIVITY    Notable for stable CKD   EKG   EKG Interpretation Date/Time:  Monday March 04 2024 17:08:52 EDT Ventricular Rate:  74 PR Interval:  142 QRS Duration:  128 QT Interval:  442 QTC Calculation: 490 R  Axis:   96  Text Interpretation: Normal sinus rhythm Rightward axis Non-specific intra-ventricular conduction block Abnormal ECG When compared with ECG of 31-Aug-2022 15:47, No significant change was found Confirmed by Hiawatha Lout ([id number]) on 03/04/2024 5:15:33 PM         Imaging Studies ordered: I ordered imaging studies including CXR On my interpretation imaging demonstrates no acute process I independently visualized and interpreted imaging. I agree with the radiologist interpretation   Medicines ordered and prescription drug management: Meds ordered this encounter  Medications   AND Linked Order Group    alum & mag hydroxide-simeth (MAALOX/MYLANTA) 200-200-20 MG/5ML suspension 30 mL    lidocaine  (XYLOCAINE ) 2 % viscous mouth solution 15 mL    -I have reviewed the patients home medicines and have made adjustments as needed   Reevaluation: After the interventions noted above, I reevaluated the patient and found that their symptoms have improved  Co morbidities that complicate the patient evaluation  Past Medical History:  Diagnosis Date   Anemia, unspecified    Anxiety    CKD (chronic kidney disease), stage III (HCC)    Depression    Diabetes mellitus type II, non insulin  dependent (HCC)    On Ozempic  and Farxiga    GERD (gastroesophageal reflux disease)    Hematuria    Hyperlipidemia    Hypertension    Mild mitral regurgitation 08/2017   Essentially normal-mild MR on echo.   Normal coronary arteries    Coronary CTA: No evidence of CAD.  Coronary calcium  score 0.   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, [facility]      Dispostion: Disposition decision including need for hospitalization was considered, and patient discharged from emergency department.    Final Clinical Impression(s) / ED Diagnoses Final diagnoses:  Atypical chest pain     This chart was dictated using voice recognition software.  Despite best efforts to proofread,  errors can  occur which can change the documentation meaning.    Mordecai Applebaum, MD 03/04/24 [phone]

## 2024-03-04 NOTE — Discharge Instructions (Addendum)
 We evaluated you for your chest pain.  Your testing in the emergency department was reassuring.  We did not see any signs of any dangerous cause of your chest pain such as a heart attack or pneumonia.  Your chest pain may be due to reflux.  Please continue to take your omeprazole  and you can try Mylanta which is available over-the-counter.  Please schedule an appointment with your GI physician.  We do not think your chest pain is heart related, but since you have not had follow-up with the cardiologist, we will place a referral.  They should call you to schedule a follow-up with cardiology.  Please return if you have any new or worsening symptoms such as severe pain, lightheadedness or dizziness, fainting, vomiting, difficulty breathing, or any other new concerning symptoms.

## 2024-03-04 NOTE — ED Triage Notes (Signed)
 Pt presents to ED from home C/O chest pain radiating to back. Pt reports she was at PCP and was told to come to ED d/t changes on EKG.

## 2024-03-05 ENCOUNTER — Encounter: Payer: Self-pay | Admitting: Family Medicine

## 2024-03-05 LAB — HEMOGLOBIN A1C: Hgb A1c MFr Bld: 7.9 % — ABNORMAL HIGH (ref 4.6–6.5)

## 2024-03-06 ENCOUNTER — Telehealth: Payer: Self-pay

## 2024-03-06 MED ORDER — TIRZEPATIDE 5 MG/0.5ML ~~LOC~~ SOAJ: 5.0000 mg | SUBCUTANEOUS | 1 refills | Status: DC

## 2024-03-06 NOTE — Telephone Encounter (Signed)
 Pharmacy Patient Advocate Encounter  Received notification from [facility] [employer] that Prior Authorization for Mounjaro 5mg /0.34ml has been APPROVED from 03/06/2024 to 03/06/2025   PA #/Case ID/Reference #: [id number]

## 2024-03-06 NOTE — Telephone Encounter (Signed)
 Pharmacy Patient Advocate Encounter   Received notification from CoverMyMeds that prior authorization for Mounjaro 5MG /0.5ML auto-injectors is required/requested.   Insurance verification completed.   The patient is insured through [facility] ADVANTAGE/RX ADVANCE .   Per test claim: PA required; PA submitted to above mentioned insurance via CoverMyMeds Key/confirmation #/EOC [facility] Status is pending

## 2024-03-07 ENCOUNTER — Other Ambulatory Visit: Payer: Self-pay | Admitting: Family Medicine

## 2024-03-07 DIAGNOSIS — E785 Hyperlipidemia, unspecified: Secondary | ICD-10-CM

## 2024-03-07 DIAGNOSIS — F4323 Adjustment disorder with mixed anxiety and depressed mood: Secondary | ICD-10-CM

## 2024-03-26 ENCOUNTER — Telehealth: Payer: Self-pay

## 2024-03-26 NOTE — Telephone Encounter (Signed)
 Called pt to advise her we have her Ozempic  here in office ready for pick up. Pt stated her PCP has started her on Wegovy  and she doesn't know what to do with the Ozempic . Pt requested a message be sent back to PCP.

## 2024-03-27 ENCOUNTER — Encounter: Payer: Self-pay | Admitting: Pharmacist

## 2024-03-27 ENCOUNTER — Other Ambulatory Visit: Payer: Self-pay | Admitting: Pharmacist

## 2024-03-27 MED ORDER — TIRZEPATIDE 7.5 MG/0.5ML ~~LOC~~ SOAJ: 7.5000 mg | SUBCUTANEOUS | 1 refills | Status: DC

## 2024-03-27 NOTE — Telephone Encounter (Signed)
 Increased dose of Mounjaro to 7.5mg  weekly

## 2024-03-27 NOTE — Progress Notes (Signed)
 03/27/2024 Name: Abigail [name] Needs MRN: [id number] DOB: 1951/08/02  Chief Complaint  Patient presents with   Medication Management   Diabetes    Abigail [name] is a 73 y.o. year old female who presented for a telephone visit.   They were referred to the pharmacist by their PCP for assistance in managing diabetes and medication access.    Subjective:  Care Team: Primary Care Provider: Kaylee Partridge, MD ; Next Scheduled Visit: not currently scheduled  Medication Access/Adherence  [id number] [place], [place] - [address] [place] [address] Phone: [phone] Fax: [phone]   Patient reports affordability concerns with their medications: Yes  Patient reports access/transportation concerns to their pharmacy: No  Patient reports adherence concerns with their medications:  No      Diabetes:  Current medications:  Mounaro 5mg  weekly on Thursdays - has had 2 doses and next dose if tomorrow Farxiga  10mg  once daily - [place] thru 11/06/2024  Medications tried in the past: Ozempic  - did not get A1c to goal; Victoza  - changed to more convenient to administer GLP1; metformin  - stopped due to decrease in renal function.  Current glucose readings: 140 to 150's     Objective:  Lab Results  Component Value Date   HGBA1C 7.9 (H) 03/04/2024    Lab Results  Component Value Date   CREATININE 1.43 (H) 03/04/2024   BUN 23 03/04/2024   NA 135 03/04/2024   K 4.1 03/04/2024   CL 100 03/04/2024   CO2 20 (L) 03/04/2024    Lab Results  Component Value Date   CHOL 172 12/15/2022   HDL 65.20 12/15/2022   LDLCALC 85 12/15/2022   TRIG 109.0 12/15/2022   CHOLHDL 3 12/15/2022    Medications Reviewed Today     Reviewed by Cecilie Coffee, RPH-CPP (Pharmacist) on 03/27/24 at 1049  Med List Status: <None>   Medication Order Taking? Sig Documenting Provider Last Dose Status Informant   Ascorbic Acid (VITAMIN C) 1000 MG tablet [id number]  Take 1,000 mg by mouth daily. [provider]  Active   atorvastatin  (LIPITOR) 20 MG tablet [id number]  TAKE 1 TABLET (20 MG TOTAL) BY MOUTH DAILY. Copland, Skipper Dumas, MD  Active   B Complex-C (SUPER B COMPLEX PO) [id number]  Take 1 tablet by mouth daily. [provider]  Active   blood glucose meter kit and supplies [id number]  Dispense based on patient and insurance preference. Use up to four times daily as directed. (FOR ICD-10 E10.9, E11.9). Copland, Skipper Dumas, MD  Active   Blood Glucose Monitoring Suppl (ONE TOUCH ULTRA 2) w/Device KIT [id number]  by Does not apply route. [provider]  Active   Cholecalciferol (VITAMIN D3) 50 MCG (2000 UT) CAPS [id number]  Take 1 capsule by mouth daily. [provider]  Active   cholestyramine  (QUESTRAN ) 4 g packet [id number]  Take 1 packet (4 g total) by mouth 3 (three) times daily with meals. Use as needed especially after a heavier meal or high fat food Copland, Skipper Dumas, MD  Active   FARXIGA  10 MG TABS tablet [id number]  Take 1 tablet (10 mg total) by mouth daily. For [place] Copland, Skipper Dumas, MD  Active            Med Note Alida Ion, Jaquita Merl   Fri Dec 08, 2023  4:42 PM) [place] 11/06/2024  FLUoxetine  (PROZAC ) 40 MG  capsule [id number]  TAKE 1 CAPSULE (40 MG TOTAL) BY MOUTH DAILY. Copland, Skipper Dumas, MD  Active   fluticasone  (FLONASE ) 50 MCG/ACT nasal spray [id number]  Place 2 sprays into both nostrils daily. Saguier, Gaylin Ke, PA-C  Active   glucose blood test strip [id number]  1 each by Other route as needed for other. Use as instructed. One Touch Ultra 2 Copland, Skipper Dumas, MD  Active   meclizine  (ANTIVERT ) 25 MG tablet [id number]  Take 1 tablet (25 mg total) by mouth 3 (three) times daily as needed for dizziness. Copland, Skipper Dumas, MD  Active            Med Note Burns Carwin   Tue Jan 31, 2023  3:20 PM) Has if needed  Multiple  Vitamins-Minerals (ALIVE WOMENS 50+) Filbert Huff [id number]  Take 1 tablet by mouth daily. [provider]  Active   nitroGLYCERIN  (NITROSTAT ) 0.4 MG SL tablet [id number]  PLACE 1 TABLET UNDER THE TONGUE EVERY 5 MINUTES AS NEEDED FOR CHEST PAIN Copland, Skipper Dumas, MD  Active            Med Note Marinus Sic   Fri Jun 12, 2020  4:22 PM) Has not had to use, but has it just in case  omeprazole  (PRILOSEC) 20 MG capsule [id number]  Take 1 capsule (20 mg total) by mouth daily. Copland, Skipper Dumas, MD  Active   propranolol  ER (INDERAL  LA) 60 MG 24 hr capsule [id number]  Take 1 capsule (60 mg total) by mouth daily. Copland, Skipper Dumas, MD  Active   tirzepatide [facility]) 5 MG/0.5ML Pen [id number]  Inject 5 mg into the skin once a week. Copland, Skipper Dumas, MD  Active   traZODone  (DESYREL ) 150 MG tablet [id number]  TAKE 1 TABLET BY MOUTH AT BEDTIME. Copland, Skipper Dumas, MD  Active   zinc gluconate 50 MG tablet [id number]  Take 50 mg by mouth daily. [provider]  Active               Assessment/Plan:   Diabetes:Currently uncontrolled - Reviewed goal A1c, goal fasting, and goal 2 hour post prandial glucose - Recommend to continue next 2 doses of Mounjaro 5mg  weekly, then increase to 7.5mg  weekly  [facility]  - Recommend to check glucose once daily at varying times of day. - Meets financial criteria for Farxiga  patient assistance program through [facility] and Me Program. Approved thru 11/06/2024.    Follow Up Plan: 4 to 6 weeks.   Cecilie Coffee, PharmD Clinical Pharmacist [facility]

## 2024-04-09 ENCOUNTER — Other Ambulatory Visit: Payer: Self-pay | Admitting: Family Medicine

## 2024-04-09 DIAGNOSIS — F329 Major depressive disorder, single episode, unspecified: Secondary | ICD-10-CM

## 2024-04-12 ENCOUNTER — Encounter: Payer: Self-pay | Admitting: Family Medicine

## 2024-05-02 ENCOUNTER — Other Ambulatory Visit: Payer: Self-pay | Admitting: Family Medicine

## 2024-05-02 DIAGNOSIS — L814 Other melanin hyperpigmentation: Secondary | ICD-10-CM | POA: Diagnosis not present

## 2024-05-02 DIAGNOSIS — L570 Actinic keratosis: Secondary | ICD-10-CM | POA: Diagnosis not present

## 2024-05-02 DIAGNOSIS — Z85828 Personal history of other malignant neoplasm of skin: Secondary | ICD-10-CM | POA: Diagnosis not present

## 2024-05-02 DIAGNOSIS — D1801 Hemangioma of skin and subcutaneous tissue: Secondary | ICD-10-CM | POA: Diagnosis not present

## 2024-05-02 DIAGNOSIS — K219 Gastro-esophageal reflux disease without esophagitis: Secondary | ICD-10-CM

## 2024-05-02 DIAGNOSIS — L821 Other seborrheic keratosis: Secondary | ICD-10-CM | POA: Diagnosis not present

## 2024-05-17 ENCOUNTER — Telehealth: Payer: Self-pay

## 2024-05-17 MED ORDER — TIRZEPATIDE 10 MG/0.5ML ~~LOC~~ SOAJ
10.0000 mg | SUBCUTANEOUS | 1 refills | Status: DC
Start: 2024-05-17 — End: 2024-09-13

## 2024-05-17 NOTE — Telephone Encounter (Signed)
 Spoke w/ Pt- her blood sugars have been 210, 220 and 219 the last few days, she has not seen in lower than 190 fasting. She wants to know if she can increase to the 10mg  Mounjaro .

## 2024-05-17 NOTE — Telephone Encounter (Signed)
 Copied from CRM [id number]. Topic: Clinical - Medical Advice >> May 17, 2024 12:52 PM Chasity T wrote: Reason for CRM: Patient is calling in because she is realizing the medication tirzepatide  (MOUNJARO ) 7.5 MG/0.5ML Pen is not getting her sugar down and wants to speak regarding a refill and wants to do an 1.0 MG

## 2024-05-24 MED ORDER — FARXIGA 10 MG PO TABS: 10.0000 mg | ORAL_TABLET | Freq: Every day | ORAL | 3 refills | Status: AC

## 2024-05-24 NOTE — Telephone Encounter (Signed)
 Updated Rx for Farxiga  sent to [facility] / [place].

## 2024-05-24 NOTE — Addendum Note (Signed)
 Addended by: CARLA MILLING B on: 05/24/2024 09:05 AM   Modules accepted: Orders

## 2024-06-09 ENCOUNTER — Other Ambulatory Visit: Payer: Self-pay | Admitting: Family Medicine

## 2024-06-09 DIAGNOSIS — F4323 Adjustment disorder with mixed anxiety and depressed mood: Secondary | ICD-10-CM

## 2024-06-09 DIAGNOSIS — E785 Hyperlipidemia, unspecified: Secondary | ICD-10-CM

## 2024-06-12 ENCOUNTER — Ambulatory Visit: Payer: Self-pay

## 2024-06-12 NOTE — Telephone Encounter (Signed)
 FYI Only or Action Required?: Action required by provider: request for appointment. Pt would like appt sooner than 8/18. Pt only willing to see her PCP  Patient was last seen in primary care on 03/04/2024 by Copland, Harlene BROCKS, MD.  Called Nurse Triage reporting Abdominal Pain.  Symptoms began several weeks ago.  Interventions attempted: Nothing.  Symptoms are: unchanged.  Triage Disposition: See PCP Within 2 Weeks  Patient/caregiver understands and will follow disposition?: Yes, will follow disposition  Copied from CRM [id number]. Topic: Clinical - Red Word Triage >> Jun 12, 2024  4:25 PM Martinique E wrote: [facility] that prompted transfer to Nurse Triage: Back and chest pain. Patient stated she is having sternum pain that radiates to her back, possibly related to reflux. Reason for Disposition  Abdominal pain is a chronic symptom (recurrent or ongoing AND present > 4 weeks)  Answer Assessment - Initial Assessment Questions 1. LOCATION: Where does it hurt?      Upper ABD, back 2. RADIATION: Does the pain shoot anywhere else? (e.g., chest, back)     back 3. ONSET: When did the pain begin? (e.g., minutes, hours or days ago)      Ongoing, has been seen for this in the past, atleast a month 4. SUDDEN: Gradual or sudden onset?     gradual 5. PATTERN Does the pain come and go, or is it constant?     intermittent 6. SEVERITY: How bad is the pain?  (e.g., Scale 1-10; mild, moderate, or severe)     Current 4, worse in the morning 7. RECURRENT SYMPTOM: Have you ever had this type of stomach pain before? If Yes, ask: When was the last time? and What happened that time?      States she has been seen for this, states that her cardiac workup has been okay so far 8. AGGRAVATING FACTORS: Does anything seem to cause this pain? (e.g., foods, stress, alcohol)     Omeprazole  helps 9. CARDIAC SYMPTOMS: Do you have any of the following symptoms: chest pain, difficulty breathing,  sweating, nausea?     denies 10. OTHER SYMPTOMS: Do you have any other symptoms? (e.g., back pain, diarrhea, fever, urination pain, vomiting)       denies  Protocols used: Abdominal Pain - Upper-A-AH

## 2024-06-13 MED ORDER — SUCRALFATE 1 G PO TABS: 1.0000 g | ORAL_TABLET | Freq: Three times a day (TID) | ORAL | 0 refills | Status: DC

## 2024-06-13 NOTE — Telephone Encounter (Signed)
 I called her to check on her - this pain is not new We talked about this back at the end of April and she was seen in the ER for a CP rule out. She does not feel like she needs to go to the ER.  Seeing cardiology on Tuesday.  Her concern today is more if she needs to see GI, she feels like her pain is more likely GERD She is using omeprazole  20 mg; advised she can double up and we will add carafate . If cardiology feels unlikely to be a cardiac issue we will move towards getting her seen by GI asap If she is not doing ok or if getting worse over the weekend she will go to the ER

## 2024-06-13 NOTE — Addendum Note (Signed)
 Addended by: WATT RAISIN C on: 06/13/2024 07:23 PM   Modules accepted: Orders

## 2024-06-23 ENCOUNTER — Other Ambulatory Visit: Payer: Self-pay | Admitting: Family Medicine

## 2024-06-25 ENCOUNTER — Ambulatory Visit: Admitting: Cardiology

## 2024-06-28 ENCOUNTER — Other Ambulatory Visit: Payer: Self-pay | Admitting: Family Medicine

## 2024-06-28 DIAGNOSIS — E119 Type 2 diabetes mellitus without complications: Secondary | ICD-10-CM

## 2024-06-28 MED ORDER — GLUCOSE BLOOD VI STRP: ORAL_STRIP | 12 refills | Status: AC

## 2024-06-28 NOTE — Telephone Encounter (Signed)
 Copied from CRM #[id number]. Topic: Clinical - Medication Refill >> Jun 28, 2024 12:47 PM Jayma L wrote: Medication: one touch ulta test strip   Has the patient contacted their pharmacy? Yes (Agent: If no, request that the patient contact the pharmacy for the refill. If patient does not wish to contact the pharmacy document the reason why and proceed with request.) (Agent: If yes, when and what did the pharmacy advise?)  This is the patient's preferred pharmacy:  [id number] [place], [place] - [address] [place] [address] Phone: [phone] Fax: [phone]  Is this the correct pharmacy for this prescription? Yes If no, delete pharmacy and type the correct one.   Has the prescription been filled recently? No  Is the patient out of the medication? Yes  Has the patient been seen for an appointment in the last year OR does the patient have an upcoming appointment? Yes  Can we respond through MyChart? Yes  Agent: Please be advised that Rx refills may take up to 3 business days. We ask that you follow-up with your pharmacy.

## 2024-07-03 ENCOUNTER — Ambulatory Visit: Attending: Cardiology | Admitting: Cardiology

## 2024-07-04 NOTE — Progress Notes (Signed)
 CARDIOLOGY CONSULT NOTE       Patient ID: Abigail [name] MRN: [id number] DOB/AGE: 1950/12/19 73 y.o.  Referring Physician: Copland Primary Physician: Watt Harlene BROCKS, MD Primary Cardiologist: New Reason for Consultation: Chest Pain   HPI:  73 y.o. referred by Dr Watt for chest pain. History of anemia, Anxiety, CKD, Depression, DM, Gerd, HLD, HTN. Seen in ED 03/04/24 complaining of SSCP for past 2-3 weeks. Not necessarily exertional. Can radiate to back. No associated dyspnea, palpitations or pre syncope. Mild nausea ? Related to her chronic GERD. Taking extra omeprazole  helps at times R/O ECG with chronic LBBB. Given Maalox with improvement.   Lab review Cr 1.43 baseline BUN 23 K 4.1 A1c 7.9. Hct 41.6 Troponin negative x 1. ECG SR IVCD of LBBB type CXR NAD, no CE, normal mediastinum.  She is on statin for HLD  LDL 85 on 12/15/22  She is on Farxiga  and Mounjaro  for DM/weight loss  Seen by Dr Maranda in 2018 Cardiac CTA 08/18/17 with calcium  score of 0 normal right dominant Coronary Arteries Seen again by Dr Anner 2022 for atypical pain and no testing done.   She is widowed with 2 children. Non smoker  Needs to see Dr Magod for EGD as her pains sound like GERD/hiatal hernia. Feels better with addition of carafate  to prilosec  She needs better BS control. And has LBBB on ECG Discussed need to r/o CAD and structural heart dx She is going to beach next week and [place] latter in month and would like any testing done in October  ROS All other systems reviewed and negative except as noted above  Past Medical History:  Diagnosis Date   Anemia, unspecified    Anxiety    CKD (chronic kidney disease), stage III (HCC)    Depression    Diabetes mellitus type II, non insulin  dependent (HCC)    On Ozempic  and Farxiga    GERD (gastroesophageal reflux disease)    Hematuria    Hyperlipidemia    Hypertension    Mild mitral regurgitation 08/2017   Essentially  normal-mild MR on echo.   Normal coronary arteries    Coronary CTA: No evidence of CAD.  Coronary calcium  score 0.   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, [facility]    Family History  Problem Relation Age of Onset   Tremor Mother    Diabetes Mother    Hyperlipidemia Mother    Diabetes Father    Heart disease Father 15       AMI x 2; first AMI age 17   Stroke Father    Hypertension Father    Other Sister 1       Died in a car accident   Hyperlipidemia Brother    AAA (abdominal aortic aneurysm) Brother    Congestive Heart Failure Brother        s/p ICD   Coronary artery disease Brother        defibrillator; CHF   Kidney cancer Brother        Status post nephrectomy   Parkinson's disease Neg Hx     Social History   Socioeconomic History   Marital status: Widowed    Spouse name: Not on file   Number of children: 2   Years of education: Not on file   Highest education level: 12th grade  Occupational History   Occupation: Surveyor, mining    Comment: Retired-Worked at [employer]  Tobacco Use   Smoking status:  Never   Smokeless tobacco: Never  Vaping Use   Vaping status: Never Used  Substance and Sexual Activity   Alcohol use: Yes    Alcohol/week: 1.0 standard drink of alcohol    Types: 1 Glasses of wine per week    Comment: occ   Drug use: No   Sexual activity: Yes    Birth control/protection: None  Other Topics Concern   Not on file  Social History Narrative   Marital status: married x 1970. ->  Now widowed      Children:  2 children; 7 grandchildren.      Employment:  [employer]. ->  Retired      Tobacco: none       Alcohol: sporadic       Exercises: walking 7 days per week.  20-30 minutes.  Walks her dog.   Social Drivers of Corporate investment banker Strain: Low Risk  (02/28/2024)   Overall Financial Resource Strain (CARDIA)    Difficulty of Paying Living Expenses: Not very hard  Food Insecurity: No Food Insecurity  (02/28/2024)   Hunger Vital Sign    Worried About Running Out of Food in the Last Year: Never true    Ran Out of Food in the Last Year: Never true  Transportation Needs: No Transportation Needs (02/28/2024)   PRAPARE - Administrator, Civil Service (Medical): No    Lack of Transportation (Non-Medical): No  Physical Activity: Inactive (02/28/2024)   Exercise Vital Sign    Days of Exercise per Week: 0 days    Minutes of Exercise per Session: 20 min  Stress: No Stress Concern Present (02/28/2024)   [employer] of Occupational Health - Occupational Stress Questionnaire    Feeling of Stress : Only a little  Social Connections: Moderately Integrated (02/28/2024)   Social Connection and Isolation Panel    Frequency of Communication with Friends and Family: Three times a week    Frequency of Social Gatherings with Friends and Family: More than three times a week    Attends Religious Services: More than 4 times per year    Active Member of Clubs or Organizations: Yes    Attends Banker Meetings: More than 4 times per year    Marital Status: Widowed  Intimate Partner Violence: Not At Risk (05/05/2021)   Humiliation, Afraid, Rape, and Kick questionnaire    Fear of Current or Ex-Partner: No    Emotionally Abused: No    Physically Abused: No    Sexually Abused: No    Past Surgical History:  Procedure Laterality Date   CHOLECYSTECTOMY N/A 06/03/2015   Procedure: LAPAROSCOPIC CHOLECYSTECTOMY WITH INTRAOPERATIVE CHOLANGIOGRAM;  Surgeon: Donnice Lima, MD;  Location: [facility] OR;  Service: General;  Laterality: N/A;   COLONOSCOPY     GUM SURGERY  2024   gum transplant for recession   HAMMER TOE SURGERY     ORIF ANKLE FRACTURE Left 11/17/2014   Procedure: OPEN REDUCTION INTERNAL FIXATION (ORIF) LEFT TRIMALLEOLAR ANKLE FRACTURE;  Surgeon: Kay Ozell Cummins, MD;  Location: [facility] OR;  Service: Orthopedics;  Laterality: Left;   OVARIAN CYST REMOVAL        Current Outpatient  Medications:    Ascorbic Acid (VITAMIN C) 1000 MG tablet, Take 1,000 mg by mouth daily., Disp: , Rfl:    atorvastatin  (LIPITOR) 20 MG tablet, TAKE 1 TABLET (20 MG TOTAL) BY MOUTH DAILY., Disp: 90 tablet, Rfl: 0   B Complex-C (SUPER B COMPLEX PO), Take 1  tablet by mouth daily., Disp: , Rfl:    blood glucose meter kit and supplies, Dispense based on patient and insurance preference. Use up to four times daily as directed. (FOR ICD-10 E10.9, E11.9)., Disp: 1 each, Rfl: 0   Blood Glucose Monitoring Suppl (ONE TOUCH ULTRA 2) w/Device KIT, by Does not apply route., Disp: , Rfl:    Cholecalciferol (VITAMIN D3) 50 MCG (2000 UT) CAPS, Take 1 capsule by mouth daily., Disp: , Rfl:    FARXIGA  10 MG TABS tablet, Take 1 tablet (10 mg total) by mouth daily. For [place], Disp: 90 tablet, Rfl: 3   FLUoxetine  (PROZAC ) 40 MG capsule, TAKE 1 CAPSULE (40 MG TOTAL) BY MOUTH DAILY., Disp: 90 capsule, Rfl: 0   fluticasone  (FLONASE ) 50 MCG/ACT nasal spray, Place 2 sprays into both nostrils daily., Disp: 16 g, Rfl: 1   meclizine  (ANTIVERT ) 25 MG tablet, Take 1 tablet (25 mg total) by mouth 3 (three) times daily as needed for dizziness., Disp: 30 tablet, Rfl: 0   Multiple Vitamins-Minerals (MULTIVITAMIN GUMMIES ADULT PO), Take by mouth daily., Disp: , Rfl:    omeprazole  (PRILOSEC) 20 MG capsule, Take 1 capsule (20 mg total) by mouth daily., Disp: 90 capsule, Rfl: 0   propranolol  ER (INDERAL  LA) 60 MG 24 hr capsule, TAKE 1 CAPSULE BY MOUTH EVERY DAY, Disp: 90 capsule, Rfl: 1   tirzepatide  (MOUNJARO ) 10 MG/0.5ML Pen, Inject 10 mg into the skin once a week., Disp: 6 mL, Rfl: 1   traZODone  (DESYREL ) 150 MG tablet, TAKE 1 TABLET BY MOUTH AT BEDTIME., Disp: 90 tablet, Rfl: 0   zinc gluconate 50 MG tablet, Take 50 mg by mouth daily., Disp: , Rfl:    cholestyramine  (QUESTRAN ) 4 g packet, Take 1 packet (4 g total) by mouth 3 (three) times daily with meals. Use as needed especially after a heavier meal or high fat  food, Disp: 60 each, Rfl: 12   glucose blood test strip, Check blood sugars once daily . One Touch Ultra 2, Disp: 100 each, Rfl: 12   nitroGLYCERIN  (NITROSTAT ) 0.4 MG SL tablet, PLACE 1 TABLET UNDER THE TONGUE EVERY 5 MINUTES AS NEEDED FOR CHEST PAIN (Patient not taking: Reported on 07/11/2024), Disp: 25 tablet, Rfl: 2   sucralfate  (CARAFATE ) 1 g tablet, Take 1 tablet (1 g total) by mouth 4 (four) times daily -  with meals and at bedtime. (Patient not taking: Reported on 07/11/2024), Disp: 40 tablet, Rfl: 0    Physical Exam: Blood pressure 106/60, pulse 91, height 5' 5 (1.651 m), weight 142 lb 6 oz (64.6 kg), SpO2 96%.    Affect appropriate Healthy:  appears stated age HEENT: normal Neck supple with no adenopathy JVP normal no bruits no thyromegaly Lungs clear with no wheezing and good diaphragmatic motion Heart:  S1/S2 no murmur, no rub, gallop or click PMI normal Abdomen: benighn, BS positve, no tenderness, no AAA no bruit.  No HSM or HJR prior GB surgery Distal pulses intact with no bruits No edema Neuro non-focal Skin warm and dry No muscular weakness   Labs:   Lab Results  Component Value Date   WBC 7.0 03/04/2024   HGB 13.2 03/04/2024   HCT 41.6 03/04/2024   MCV 86.5 03/04/2024   PLT 180 03/04/2024   No results for input(s): NA, K, CL, CO2, BUN, CREATININE, CALCIUM , PROT, BILITOT, ALKPHOS, ALT, AST, GLUCOSE in the last 168 hours.  Invalid input(s): LABALBU Lab Results  Component Value Date   TROPONINI <  0.03 08/17/2017    Lab Results  Component Value Date   CHOL 172 12/15/2022   CHOL 148 12/06/2021   CHOL 173 02/24/2020   Lab Results  Component Value Date   HDL 65.20 12/15/2022   HDL 48.40 12/06/2021   HDL 56.60 02/24/2020   Lab Results  Component Value Date   LDLCALC 85 12/15/2022   LDLCALC 66 12/06/2021   LDLCALC 76 02/24/2020   Lab Results  Component Value Date   TRIG 109.0 12/15/2022   TRIG 168.0 (H) 12/06/2021   TRIG  199.0 (H) 02/24/2020   Lab Results  Component Value Date   CHOLHDL 3 12/15/2022   CHOLHDL 3 12/06/2021   CHOLHDL 3 02/24/2020   No results found for: LDLDIRECT    Radiology: No results found.  EKG: SR LBBB   ASSESSMENT AND PLAN:   Chest Pain: atypical with GI overtones. Cardiac CTA 2018 calcium  score 0 normal right dominant cors. R/O in ED 02/2024. ECG with IVCD LBBB type. Multiple CRF;s including HTN, HLD, DM with poorly controlled A1c.  CRF with Cr 1.4 Shared decision making favor lexiscan myovue to risk stratify HTN:  continue  inderal  ? Not on ACE/ARB with DM and Cr 1.4  f/u primary HLD:  continue statin update labs DM:  poorly controlled A1c 7.9 03/04/24 on Farxiga  and mounjaro  Discussed low carb diet f/u primary GERD: ? Etiology of chest pain. On omemprazole and better with addition of carafate  LBBB: on ECG IVCD in 2023 echo to r/o DCM and structural heart dx  Lexiscan Myovue Echo  F/U in a year pending testing   Signed: Maude Emmer 07/11/2024, 4:18 PM

## 2024-07-08 ENCOUNTER — Other Ambulatory Visit: Payer: Self-pay | Admitting: Family Medicine

## 2024-07-08 DIAGNOSIS — F329 Major depressive disorder, single episode, unspecified: Secondary | ICD-10-CM

## 2024-07-09 ENCOUNTER — Encounter (HOSPITAL_BASED_OUTPATIENT_CLINIC_OR_DEPARTMENT_OTHER): Payer: Self-pay

## 2024-07-11 ENCOUNTER — Ambulatory Visit: Attending: Cardiovascular Disease | Admitting: Cardiovascular Disease

## 2024-07-11 ENCOUNTER — Encounter: Payer: Self-pay | Admitting: Cardiovascular Disease

## 2024-07-11 VITALS — BP 106/60 | HR 91 | Ht 65.0 in | Wt 142.4 lb

## 2024-07-11 DIAGNOSIS — I10 Essential (primary) hypertension: Secondary | ICD-10-CM | POA: Diagnosis not present

## 2024-07-11 DIAGNOSIS — R072 Precordial pain: Secondary | ICD-10-CM | POA: Diagnosis not present

## 2024-07-11 DIAGNOSIS — I447 Left bundle-branch block, unspecified: Secondary | ICD-10-CM

## 2024-07-11 NOTE — Patient Instructions (Addendum)
 Medication Instructions:  Your physician recommends that you continue on your current medications as directed. Please refer to the Current Medication list given to you today.  *If you need a refill on your cardiac medications before your next appointment, please call your pharmacy*  Lab Work: If you have labs (blood work) drawn today and your tests are completely normal, you will receive your results only by: MyChart Message (if you have MyChart) OR A paper copy in the mail If you have any lab test that is abnormal or we need to change your treatment, we will call you to review the results.  Testing/Procedures: Your physician has requested that you have a lexiscan myoview. For further information please visit [email or web address] Please follow instruction sheet, as given.  Follow-Up: At [facility], you and your health needs are our priority.  As part of our continuing mission to provide you with exceptional heart care, our providers are all part of one team.  This team includes your primary Cardiologist (physician) and Advanced Practice Providers or APPs (Physician Assistants and Nurse Practitioners) who all work together to provide you with the care you need, when you need it.  Your next appointment:   1 year(s)  Provider:   Maude Emmer, MD    We recommend signing up for the patient portal called MyChart.  Sign up information is provided on this After Visit Summary.  MyChart is used to connect with patients for Virtual Visits (Telemedicine).  Patients are able to view lab/test results, encounter notes, upcoming appointments, etc.  Non-urgent messages can be sent to your provider as well.   To learn more about what you can do with MyChart, go to [email or web address].    Your physician has requested that you have a lexiscan myoview. For further information please visit [email or web address]   How to Prepare for Your Myoview Test:        A. Nothing to eat or drink 3 hours  prior to arrival time, except you may drink water       B. No Caffeine/Decaffeinated products or chocolate 12 hours prior to arrival.       C. No Cologne or Lotion       D. Wear comfortable walking shoes       E. Total time is 3 to 4 hours; you may want to bring reading material for the waiting time. If someone comes with you, they will need to remain in the lobby due to           limited space in the testing area.        F. Medication Instructions: You do not need to hold any medications.  After You Arrive:  Once you arrive in the Nuclear Cardiology lab, an IV will be started, then the Technologist will inject a small amount of radioactive tracer. There will be a 1 hour waiting period after this injection. A series of pictures will be taken of your heart following this waiting period. You will be prepped for the stress portion of the test. During the stress portion of your test a small amount of radioactive tracer will be injected in your IV. After the stress portion, there is a short rest period during which time your heart and blood pressure will be monitored. After the short test period the Technologist will begin your second set of pictures. Your doctor will inform you of your test results within 7 days.  In preparation for your appointment, medication, and supplies will be purchased. Appointment  availability is limited, so if you need to cancel or reschedule please call the Nuclear Department at [phone], 24 hours in advance to avoid a cancellation fee of $50.00

## 2024-07-17 ENCOUNTER — Other Ambulatory Visit: Payer: Self-pay | Admitting: Family Medicine

## 2024-07-17 DIAGNOSIS — K219 Gastro-esophageal reflux disease without esophagitis: Secondary | ICD-10-CM

## 2024-07-22 ENCOUNTER — Encounter (HOSPITAL_COMMUNITY): Payer: Self-pay

## 2024-08-09 ENCOUNTER — Telehealth: Payer: Self-pay

## 2024-08-09 ENCOUNTER — Other Ambulatory Visit: Payer: Self-pay | Admitting: Cardiovascular Disease

## 2024-08-09 DIAGNOSIS — R072 Precordial pain: Secondary | ICD-10-CM

## 2024-08-09 DIAGNOSIS — I10 Essential (primary) hypertension: Secondary | ICD-10-CM

## 2024-08-09 NOTE — Telephone Encounter (Signed)
 PAP: Patient assistance application for Farxiga  through [employer] (AZ&Me) has been mailed to pt's home address on file. Provider portion of application will be faxed to provider's office.For renewal 2026.

## 2024-08-13 ENCOUNTER — Ambulatory Visit (HOSPITAL_COMMUNITY)
Admission: RE | Admit: 2024-08-13 | Discharge: 2024-08-13 | Disposition: A | Discharge status: Home / Self Care | Source: Ambulatory Visit | Attending: Student in an Organized Health Care Education/Training Program | Admitting: Student in an Organized Health Care Education/Training Program

## 2024-08-13 ENCOUNTER — Ambulatory Visit: Payer: Self-pay | Admitting: Cardiovascular Disease

## 2024-08-13 DIAGNOSIS — Z9049 Acquired absence of other specified parts of digestive tract: Secondary | ICD-10-CM | POA: Diagnosis not present

## 2024-08-13 DIAGNOSIS — I10 Essential (primary) hypertension: Secondary | ICD-10-CM | POA: Insufficient documentation

## 2024-08-13 DIAGNOSIS — M47814 Spondylosis without myelopathy or radiculopathy, thoracic region: Secondary | ICD-10-CM | POA: Diagnosis not present

## 2024-08-13 DIAGNOSIS — R072 Precordial pain: Secondary | ICD-10-CM | POA: Insufficient documentation

## 2024-08-13 DIAGNOSIS — I700 Atherosclerosis of aorta: Secondary | ICD-10-CM | POA: Diagnosis not present

## 2024-08-13 DIAGNOSIS — R9439 Abnormal result of other cardiovascular function study: Secondary | ICD-10-CM

## 2024-08-13 LAB — MYOCARDIAL PERFUSION IMAGING
LV dias vol: 158 mL (ref 46–106)
LV sys vol: 117 mL (ref 3.8–5.2)
Nuc Stress EF: 26 %
Peak HR: 110 {beats}/min
Rest HR: 75 {beats}/min
Rest Nuclear Isotope Dose: 10.6 mCi
SDS: 2
SRS: 17
SSS: 15
Stress Nuclear Isotope Dose: 32.6 mCi
TID: 1.08

## 2024-08-13 MED ORDER — REGADENOSON 0.4 MG/5ML IV SOLN
0.4000 mg | Freq: Once | INTRAVENOUS | Status: AC
  Administered 2024-08-13: 0.4 mg via INTRAVENOUS

## 2024-08-13 MED ORDER — TECHNETIUM TC 99M TETROFOSMIN IV KIT
32.6000 | PACK | Freq: Once | INTRAVENOUS | Status: AC | PRN
Start: 2024-08-13 — End: 2024-08-13
  Administered 2024-08-13: 32.6 via INTRAVENOUS

## 2024-08-13 MED ORDER — TECHNETIUM TC 99M TETROFOSMIN IV KIT
10.6000 | PACK | Freq: Once | INTRAVENOUS | Status: AC | PRN
Start: 2024-08-13 — End: 2024-08-13
  Administered 2024-08-13: 10.6 via INTRAVENOUS

## 2024-08-13 MED ORDER — REGADENOSON 0.4 MG/5ML IV SOLN
INTRAVENOUS | Status: AC
  Filled 2024-08-13: qty 5

## 2024-08-13 NOTE — Telephone Encounter (Signed)
 Received Provider's portion of application for Farxiga  AZ&ME.

## 2024-08-14 NOTE — Telephone Encounter (Signed)
 Pt called back to confirm she will be at the appt 10/9 at 9:20AM.

## 2024-08-15 ENCOUNTER — Ambulatory Visit: Attending: Cardiovascular Disease | Admitting: Cardiovascular Disease

## 2024-08-15 ENCOUNTER — Encounter: Payer: Self-pay | Admitting: Cardiovascular Disease

## 2024-08-15 ENCOUNTER — Ambulatory Visit: Attending: Cardiovascular Disease

## 2024-08-15 ENCOUNTER — Other Ambulatory Visit: Payer: Self-pay | Admitting: Cardiovascular Disease

## 2024-08-15 VITALS — BP 104/65 | HR 97 | Ht 65.0 in | Wt 143.0 lb

## 2024-08-15 DIAGNOSIS — R9439 Abnormal result of other cardiovascular function study: Secondary | ICD-10-CM

## 2024-08-15 DIAGNOSIS — R079 Chest pain, unspecified: Secondary | ICD-10-CM

## 2024-08-15 LAB — ECHOCARDIOGRAM COMPLETE
AR max vel: 1.99 cm2
AV Area VTI: 2.16 cm2
AV Area mean vel: 1.92 cm2
AV Mean grad: 2 mmHg
AV Peak grad: 4.1 mmHg
Ao pk vel: 1.01 m/s
Area-P 1/2: 7.74 cm2
Calc EF: 45 %
Height: 65 in
S' Lateral: 4.5 cm
Single Plane A2C EF: 11.5 %
Single Plane A4C EF: 35.4 %
Weight: 2288 [oz_av]

## 2024-08-15 LAB — CBC

## 2024-08-15 MED ORDER — PERFLUTREN LIPID MICROSPHERE
1.0000 mL | INTRAVENOUS | Status: AC | PRN
  Administered 2024-08-15: 2 mL via INTRAVENOUS

## 2024-08-15 NOTE — H&P (View-Only) (Signed)
 Chief Complaint  Patient presents with   Follow-up    Chest pain, abnormal stress test    History of Present Illness: 73 yo female with history of anemia, anxiety, depression, CKD, DM, GERD, LBBB, HTN and HLD who is here as an add on for evaluation of chest pain. She was seen in our office most recently by Dr. Delford. She was seen in 2018 by Dr. Maranda for chest pain and had a coronary CTA with no evidence of CAD and calcium  score of zero. When she was seen here on 07/11/24 by Dr. Delford she complained of chest pain that sounded GI related but he ordered a stress test and echo given her risk factors for CAD. Stress myoview 08/13/24 with possible anterior perfusion defect and severely reduced LV function. High risk study. Echo pending.   She is here today for follow up. She tells me that she has had back pain. No discreet chest pain. She denies dyspnea, palpitations, lower extremity edema, orthopnea, PND, dizziness, near syncope or syncope.   Primary Care Physician: Watt Harlene BROCKS, MD   Past Medical History:  Diagnosis Date   Anemia, unspecified    Anxiety    CKD (chronic kidney disease), stage III (HCC)    Depression    Diabetes mellitus type II, non insulin  dependent (HCC)    On Ozempic  and Farxiga    GERD (gastroesophageal reflux disease)    Hematuria    Hyperlipidemia    Hypertension    Mild mitral regurgitation 08/2017   Essentially normal-mild MR on echo.   Normal coronary arteries    Coronary CTA: No evidence of CAD.  Coronary calcium  score 0.   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, Ruthellen Domino    Past Surgical History:  Procedure Laterality Date   CHOLECYSTECTOMY N/A 06/03/2015   Procedure: LAPAROSCOPIC CHOLECYSTECTOMY WITH INTRAOPERATIVE CHOLANGIOGRAM;  Surgeon: Donnice Lima, MD;  Location: [facility] OR;  Service: General;  Laterality: N/A;   COLONOSCOPY     GUM SURGERY  2024   gum transplant for recession   HAMMER TOE SURGERY     ORIF ANKLE FRACTURE Left  11/17/2014   Procedure: OPEN REDUCTION INTERNAL FIXATION (ORIF) LEFT TRIMALLEOLAR ANKLE FRACTURE;  Surgeon: Kay Ozell Cummins, MD;  Location: [facility] OR;  Service: Orthopedics;  Laterality: Left;   OVARIAN CYST REMOVAL      Current Outpatient Medications  Medication Sig Dispense Refill   Ascorbic Acid (VITAMIN C) 1000 MG tablet Take 1,000 mg by mouth daily.     atorvastatin  (LIPITOR) 20 MG tablet TAKE 1 TABLET (20 MG TOTAL) BY MOUTH DAILY. 90 tablet 0   B Complex-C (SUPER B COMPLEX PO) Take 1 tablet by mouth daily.     blood glucose meter kit and supplies Dispense based on patient and insurance preference. Use up to four times daily as directed. (FOR ICD-10 E10.9, E11.9). 1 each 0   Blood Glucose Monitoring Suppl (ONE TOUCH ULTRA 2) w/Device KIT by Does not apply route.     Cholecalciferol (VITAMIN D3) 50 MCG (2000 UT) CAPS Take 1 capsule by mouth daily.     FARXIGA  10 MG TABS tablet Take 1 tablet (10 mg total) by mouth daily. For [place] and [place] assistance program 90 tablet 3   FLUoxetine  (PROZAC ) 40 MG capsule TAKE 1 CAPSULE (40 MG TOTAL) BY MOUTH DAILY. 90 capsule 0   fluticasone  (FLONASE ) 50 MCG/ACT nasal spray Place 2 sprays into both nostrils daily. 16 g 1   glucose blood test strip  Check blood sugars once daily . One Touch Ultra 2 100 each 12   meclizine  (ANTIVERT ) 25 MG tablet Take 1 tablet (25 mg total) by mouth 3 (three) times daily as needed for dizziness. 30 tablet 0   Multiple Vitamins-Minerals (MULTIVITAMIN GUMMIES ADULT PO) Take by mouth daily.     nitroGLYCERIN  (NITROSTAT ) 0.4 MG SL tablet PLACE 1 TABLET UNDER THE TONGUE EVERY 5 MINUTES AS NEEDED FOR CHEST PAIN 25 tablet 2   omeprazole  (PRILOSEC) 20 MG capsule Take 1 capsule (20 mg total) by mouth daily. Needs appt 30 capsule 0   propranolol  ER (INDERAL  LA) 60 MG 24 hr capsule TAKE 1 CAPSULE BY MOUTH EVERY DAY 90 capsule 1   sucralfate  (CARAFATE ) 1 g tablet Take 1 tablet (1 g total) by mouth 4 (four) times daily -  with meals and at  bedtime. 40 tablet 0   tirzepatide  (MOUNJARO ) 10 MG/0.5ML Pen Inject 10 mg into the skin once a week. 6 mL 1   traZODone  (DESYREL ) 150 MG tablet TAKE 1 TABLET BY MOUTH AT BEDTIME. 90 tablet 0   zinc gluconate 50 MG tablet Take 50 mg by mouth daily.     cholestyramine  (QUESTRAN ) 4 g packet Take 1 packet (4 g total) by mouth 3 (three) times daily with meals. Use as needed especially after a heavier meal or high fat food 60 each 12   No current facility-administered medications for this visit.    Allergies  Allergen Reactions   Pneumovax 23 [Pneumococcal Vac Polyvalent] Other (See Comments)    Pt had severe arm pain   Amaryl  [Glimepiride ] Nausea Only   Cephalexin Hives   Sulfa Antibiotics Rash    Social History   Socioeconomic History   Marital status: Widowed    Spouse name: Not on file   Number of children: 2   Years of education: Not on file   Highest education level: 12th grade  Occupational History   Occupation: Surveyor, mining    Comment: Retired-Worked at [employer]  Tobacco Use   Smoking status: Never   Smokeless tobacco: Never  Vaping Use   Vaping status: Never Used  Substance and Sexual Activity   Alcohol use: Yes    Alcohol/week: 1.0 standard drink of alcohol    Types: 1 Glasses of wine per week    Comment: occ   Drug use: No   Sexual activity: Yes    Birth control/protection: None  Other Topics Concern   Not on file  Social History Narrative   Marital status: married x 1970. ->  Now widowed      Children:  2 children; 7 grandchildren.      Employment:  [employer]. ->  Retired      Tobacco: none       Alcohol: sporadic       Exercises: walking 7 days per week.  20-30 minutes.  Walks her dog.   Social Drivers of Corporate investment banker Strain: Low Risk  (02/28/2024)   Overall Financial Resource Strain (CARDIA)    Difficulty of Paying Living Expenses: Not very hard  Food Insecurity: No Food Insecurity (02/28/2024)    Hunger Vital Sign    Worried About Running Out of Food in the Last Year: Never true    Ran Out of Food in the Last Year: Never true  Transportation Needs: No Transportation Needs (02/28/2024)   PRAPARE - Administrator, Civil Service (Medical): No    Lack of Transportation (Non-Medical):  No  Physical Activity: Inactive (02/28/2024)   Exercise Vital Sign    Days of Exercise per Week: 0 days    Minutes of Exercise per Session: 20 min  Stress: No Stress Concern Present (02/28/2024)   [employer] of Occupational Health - Occupational Stress Questionnaire    Feeling of Stress : Only a little  Social Connections: Moderately Integrated (02/28/2024)   Social Connection and Isolation Panel    Frequency of Communication with Friends and Family: Three times a week    Frequency of Social Gatherings with Friends and Family: More than three times a week    Attends Religious Services: More than 4 times per year    Active Member of Clubs or Organizations: Yes    Attends Banker Meetings: More than 4 times per year    Marital Status: Widowed  Intimate Partner Violence: Not At Risk (05/05/2021)   Humiliation, Afraid, Rape, and Kick questionnaire    Fear of Current or Ex-Partner: No    Emotionally Abused: No    Physically Abused: No    Sexually Abused: No    Family History  Problem Relation Age of Onset   Tremor Mother    Diabetes Mother    Hyperlipidemia Mother    Diabetes Father    Heart disease Father 77       AMI x 2; first AMI age 62   Stroke Father    Hypertension Father    Other Sister 28       Died in a car accident   Hyperlipidemia Brother    AAA (abdominal aortic aneurysm) Brother    Congestive Heart Failure Brother        s/p ICD   Coronary artery disease Brother        defibrillator; CHF   Kidney cancer Brother        Status post nephrectomy   Parkinson's disease Neg Hx     Review of Systems:  As stated in the HPI and otherwise negative.   BP  104/65 (BP Location: Left Arm, Patient Position: Sitting, Cuff Size: Normal)   Pulse 97   Ht 5' 5 (1.651 m)   Wt 143 lb (64.9 kg)   SpO2 97%   BMI 23.80 kg/m   Physical Examination: General: Well developed, well nourished, NAD  HEENT: OP clear, mucus membranes moist  SKIN: warm, dry. No rashes. Neuro: No focal deficits  Musculoskeletal: Muscle strength 5/5 all ext  Psychiatric: Mood and affect normal  Neck: No JVD, no carotid bruits, no thyromegaly, no lymphadenopathy.  Lungs:Clear bilaterally, no wheezes, rhonci, crackles Cardiovascular: Regular rate and rhythm. No murmurs, gallops or rubs. Abdomen:Soft. Bowel sounds present. Non-tender.  Extremities: No lower extremity edema. Pulses are 2 + in the bilateral DP/PT.  EKG:  EKG is ordered today. The ekg ordered today demonstrates  EKG Interpretation Date/Time:  Thursday August 15 2024 09:43:21 EDT Ventricular Rate:  86 PR Interval:  140 QRS Duration:  130 QT Interval:  420 QTC Calculation: 502 R Axis:   -36  Text Interpretation: Normal sinus rhythm Left axis deviation Left bundle branch block When compared with ECG of 11-Jul-2024 16:18, No significant change was found Confirmed by Verlin Bruckner [id number]) on 08/15/2024 9:52:39 AM   Recent Labs: 03/04/2024: BUN 23; Creatinine, Ser 1.43; Hemoglobin 13.2; Platelets 180; Potassium 4.1; Sodium 135   Lipid Panel    Component Value Date/Time   CHOL 172 12/15/2022 1331   TRIG 109.0 12/15/2022 1331   HDL 65.20 12/15/2022 1331  CHOLHDL 3 12/15/2022 1331   VLDL 21.8 12/15/2022 1331   LDLCALC 85 12/15/2022 1331     Wt Readings from Last 3 Encounters:  08/15/24 143 lb (64.9 kg)  07/11/24 142 lb 6 oz (64.6 kg)  03/04/24 136 lb (61.7 kg)    Assessment and Plan:   1. Chest pain: Abnormal stress test suggesting anterior wall ischemia with reduced LV systolic function on myoview. Will plan cardiac cath at [facility] 08/22/24 at 10:30am. BMET and CBC today.   I have reviewed the  risks, indications, and alternatives to cardiac catheterization, possible angioplasty, and stenting with the patient. Risks include but are not limited to bleeding, infection, vascular injury, stroke, myocardial infection, arrhythmia, kidney injury, radiation-related injury in the case of prolonged fluoroscopy use, emergency cardiac surgery, and death. The patient understands the risks of serious complication is 1-2 in 1000 with diagnostic cardiac cath and 1-2% or less with angioplasty/stenting. -Expedite echo-plan later today at [address] office  Labs/ tests ordered today include:   Orders Placed This Encounter  Procedures   Basic Metabolic Panel (BMET)   CBC   EKG 12-Lead   EKG 12-Lead   Disposition:   F/U with office APP 3-4 weeks post cath    Signed, Lonni Cash, MD, [facility] 08/15/2024 10:23 AM    [facility] [address] [place], [place], [place]  [address] Phone: [phone]; Fax: [phone]

## 2024-08-15 NOTE — Patient Instructions (Addendum)
 Medication Instructions:  HOLD FARXIGA  3 DAYS PRIOR TO CATH *If you need a refill on your cardiac medications before your next appointment, please call your pharmacy*  Lab Work: TODAY-BMET & CBC If you have labs (blood work) drawn today and your tests are completely normal, you will receive your results only by: MyChart Message (if you have MyChart) OR A paper copy in the mail If you have any lab test that is abnormal or we need to change your treatment, we will call you to review the results.  Testing/Procedures: Your physician has requested that you have a cardiac catheterization. Cardiac catheterization is used to diagnose and/or treat various heart conditions. Doctors may recommend this procedure for a number of different reasons. The most common reason is to evaluate chest pain. Chest pain can be a symptom of coronary artery disease (CAD), and cardiac catheterization can show whether plaque is narrowing or blocking your heart's arteries. This procedure is also used to evaluate the valves, as well as measure the blood flow and oxygen levels in different parts of your heart. For further information please visit [email or web address] Please follow instruction sheet, as given.  Follow-Up: At [facility], you and your health needs are our priority.  As part of our continuing mission to provide you with exceptional heart care, our providers are all part of one team.  This team includes your primary Cardiologist (physician) and Advanced Practice Providers or APPs (Physician Assistants and Nurse Practitioners) who all work together to provide you with the care you need, when you need it.  Your next appointment:   2-4 week(s)  Provider:   ANY APP    We recommend signing up for the patient portal called MyChart.  Sign up information is provided on this After Visit Summary.  MyChart is used to connect with patients for Virtual Visits (Telemedicine).  Patients are able to view lab/test  results, encounter notes, upcoming appointments, etc.  Non-urgent messages can be sent to your provider as well.   To learn more about what you can do with MyChart, go to [email or web address].   Other Instructions          Cardiac Catheterization   You are scheduled for a Cardiac Catheterization on Thursday, October 16 with Dr. Lonni Cash.  1. Please arrive at the [facility] (Main Entrance A) at [facility]: [address] [place], [place] [address] at 8:30 AM (This time is 2 hour(s) before your procedure to ensure your preparation).   Free valet parking service is available. You will check in at ADMITTING. The support person will be asked to wait in the waiting room.  It is OK to have someone drop you off and come back when you are ready to be discharged.        Special note: Every effort is made to have your procedure done on time. Please understand that emergencies sometimes delay scheduled procedures.  2. Diet: Nothing to eat after midnight.  3. Hydration:You need to be well hydrated before your procedure. On October 16, you may drink approved liquids (see below) until 2 hours before the procedure, with 16 oz of water as your last intake.   List of approved liquids water, clear juice, clear tea, black coffee, fruit juices, non-citric and without pulp, carbonated beverages, Gatorade, Kool -Aid, plain Jello-O and plain ice popsicles.  4. Labs: You will need to have blood drawn on Thursday, October 9 at [facility] (  1st Floor), [address], [place], [place] [address]. You do not need to be fasting.  5. Medication instructions in preparation for your procedure:   Contrast Allergy: No  HOLD FARXIGA  (dapagliflozin) 3 DAYS PRIOR TO CATH (Do not take any more after Sunday October 12th, 2025)  On the morning of your procedure, take Aspirin  81 mg and any morning medicines NOT listed above.  You may use sips of  water.  6. Plan to go home the same day, you will only stay overnight if medically necessary. 7. You MUST have a responsible adult to drive you home. 8. An adult MUST be with you the first 24 hours after you arrive home. 9. Bring a current list of your medications, and the last time and date medication taken. 10. Bring ID and current insurance cards. 11.Please wear clothes that are easy to get on and off and wear slip-on shoes.  Thank you for allowing us  to care for you!   -- [facility]

## 2024-08-15 NOTE — Progress Notes (Signed)
 Chief Complaint  Patient presents with   Follow-up    Chest pain, abnormal stress test    History of Present Illness: 73 yo female with history of anemia, anxiety, depression, CKD, DM, GERD, LBBB, HTN and HLD who is here as an add on for evaluation of chest pain. She was seen in our office most recently by Dr. Delford. She was seen in 2018 by Dr. Maranda for chest pain and had a coronary CTA with no evidence of CAD and calcium  score of zero. When she was seen here on 07/11/24 by Dr. Delford she complained of chest pain that sounded GI related but he ordered a stress test and echo given her risk factors for CAD. Stress myoview 08/13/24 with possible anterior perfusion defect and severely reduced LV function. High risk study. Echo pending.   She is here today for follow up. She tells me that she has had back pain. No discreet chest pain. She denies dyspnea, palpitations, lower extremity edema, orthopnea, PND, dizziness, near syncope or syncope.   Primary Care Physician: Watt Harlene BROCKS, MD   Past Medical History:  Diagnosis Date   Anemia, unspecified    Anxiety    CKD (chronic kidney disease), stage III (HCC)    Depression    Diabetes mellitus type II, non insulin  dependent (HCC)    On Ozempic  and Farxiga    GERD (gastroesophageal reflux disease)    Hematuria    Hyperlipidemia    Hypertension    Mild mitral regurgitation 08/2017   Essentially normal-mild MR on echo.   Normal coronary arteries    Coronary CTA: No evidence of CAD.  Coronary calcium  score 0.   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, Ruthellen Domino    Past Surgical History:  Procedure Laterality Date   CHOLECYSTECTOMY N/A 06/03/2015   Procedure: LAPAROSCOPIC CHOLECYSTECTOMY WITH INTRAOPERATIVE CHOLANGIOGRAM;  Surgeon: Donnice Lima, MD;  Location: [facility] OR;  Service: General;  Laterality: N/A;   COLONOSCOPY     GUM SURGERY  2024   gum transplant for recession   HAMMER TOE SURGERY     ORIF ANKLE FRACTURE Left  11/17/2014   Procedure: OPEN REDUCTION INTERNAL FIXATION (ORIF) LEFT TRIMALLEOLAR ANKLE FRACTURE;  Surgeon: Kay Ozell Cummins, MD;  Location: [facility] OR;  Service: Orthopedics;  Laterality: Left;   OVARIAN CYST REMOVAL      Current Outpatient Medications  Medication Sig Dispense Refill   Ascorbic Acid (VITAMIN C) 1000 MG tablet Take 1,000 mg by mouth daily.     atorvastatin  (LIPITOR) 20 MG tablet TAKE 1 TABLET (20 MG TOTAL) BY MOUTH DAILY. 90 tablet 0   B Complex-C (SUPER B COMPLEX PO) Take 1 tablet by mouth daily.     blood glucose meter kit and supplies Dispense based on patient and insurance preference. Use up to four times daily as directed. (FOR ICD-10 E10.9, E11.9). 1 each 0   Blood Glucose Monitoring Suppl (ONE TOUCH ULTRA 2) w/Device KIT by Does not apply route.     Cholecalciferol (VITAMIN D3) 50 MCG (2000 UT) CAPS Take 1 capsule by mouth daily.     FARXIGA  10 MG TABS tablet Take 1 tablet (10 mg total) by mouth daily. For [place] and Me assistance program 90 tablet 3   FLUoxetine  (PROZAC ) 40 MG capsule TAKE 1 CAPSULE (40 MG TOTAL) BY MOUTH DAILY. 90 capsule 0   fluticasone  (FLONASE ) 50 MCG/ACT nasal spray Place 2 sprays into both nostrils daily. 16 g 1   glucose blood test strip  Check blood sugars once daily . One Touch Ultra 2 100 each 12   meclizine  (ANTIVERT ) 25 MG tablet Take 1 tablet (25 mg total) by mouth 3 (three) times daily as needed for dizziness. 30 tablet 0   Multiple Vitamins-Minerals (MULTIVITAMIN GUMMIES ADULT PO) Take by mouth daily.     nitroGLYCERIN  (NITROSTAT ) 0.4 MG SL tablet PLACE 1 TABLET UNDER THE TONGUE EVERY 5 MINUTES AS NEEDED FOR CHEST PAIN 25 tablet 2   omeprazole  (PRILOSEC) 20 MG capsule Take 1 capsule (20 mg total) by mouth daily. Needs appt 30 capsule 0   propranolol  ER (INDERAL  LA) 60 MG 24 hr capsule TAKE 1 CAPSULE BY MOUTH EVERY DAY 90 capsule 1   sucralfate  (CARAFATE ) 1 g tablet Take 1 tablet (1 g total) by mouth 4 (four) times daily -  with meals and at  bedtime. 40 tablet 0   tirzepatide  (MOUNJARO ) 10 MG/0.5ML Pen Inject 10 mg into the skin once a week. 6 mL 1   traZODone  (DESYREL ) 150 MG tablet TAKE 1 TABLET BY MOUTH AT BEDTIME. 90 tablet 0   zinc gluconate 50 MG tablet Take 50 mg by mouth daily.     cholestyramine  (QUESTRAN ) 4 g packet Take 1 packet (4 g total) by mouth 3 (three) times daily with meals. Use as needed especially after a heavier meal or high fat food 60 each 12   No current facility-administered medications for this visit.    Allergies  Allergen Reactions   Pneumovax 23 [Pneumococcal Vac Polyvalent] Other (See Comments)    Pt had severe arm pain   Amaryl  [Glimepiride ] Nausea Only   Cephalexin Hives   Sulfa Antibiotics Rash    Social History   Socioeconomic History   Marital status: Widowed    Spouse name: Not on file   Number of children: 2   Years of education: Not on file   Highest education level: 12th grade  Occupational History   Occupation: Surveyor, mining    Comment: Retired-Worked at [employer]  Tobacco Use   Smoking status: Never   Smokeless tobacco: Never  Vaping Use   Vaping status: Never Used  Substance and Sexual Activity   Alcohol use: Yes    Alcohol/week: 1.0 standard drink of alcohol    Types: 1 Glasses of wine per week    Comment: occ   Drug use: No   Sexual activity: Yes    Birth control/protection: None  Other Topics Concern   Not on file  Social History Narrative   Marital status: married x 1970. ->  Now widowed      Children:  2 children; 7 grandchildren.      Employment:  [employer]. ->  Retired      Tobacco: none       Alcohol: sporadic       Exercises: walking 7 days per week.  20-30 minutes.  Walks her dog.   Social Drivers of Corporate investment banker Strain: Low Risk  (02/28/2024)   Overall Financial Resource Strain (CARDIA)    Difficulty of Paying Living Expenses: Not very hard  Food Insecurity: No Food Insecurity (02/28/2024)    Hunger Vital Sign    Worried About Running Out of Food in the Last Year: Never true    Ran Out of Food in the Last Year: Never true  Transportation Needs: No Transportation Needs (02/28/2024)   PRAPARE - Administrator, Civil Service (Medical): No    Lack of Transportation (Non-Medical):  No  Physical Activity: Inactive (02/28/2024)   Exercise Vital Sign    Days of Exercise per Week: 0 days    Minutes of Exercise per Session: 20 min  Stress: No Stress Concern Present (02/28/2024)   [employer] of Occupational Health - Occupational Stress Questionnaire    Feeling of Stress : Only a little  Social Connections: Moderately Integrated (02/28/2024)   Social Connection and Isolation Panel    Frequency of Communication with Friends and Family: Three times a week    Frequency of Social Gatherings with Friends and Family: More than three times a week    Attends Religious Services: More than 4 times per year    Active Member of Clubs or Organizations: Yes    Attends Banker Meetings: More than 4 times per year    Marital Status: Widowed  Intimate Partner Violence: Not At Risk (05/05/2021)   Humiliation, Afraid, Rape, and Kick questionnaire    Fear of Current or Ex-Partner: No    Emotionally Abused: No    Physically Abused: No    Sexually Abused: No    Family History  Problem Relation Age of Onset   Tremor Mother    Diabetes Mother    Hyperlipidemia Mother    Diabetes Father    Heart disease Father 77       AMI x 2; first AMI age 62   Stroke Father    Hypertension Father    Other Sister 28       Died in a car accident   Hyperlipidemia Brother    AAA (abdominal aortic aneurysm) Brother    Congestive Heart Failure Brother        s/p ICD   Coronary artery disease Brother        defibrillator; CHF   Kidney cancer Brother        Status post nephrectomy   Parkinson's disease Neg Hx     Review of Systems:  As stated in the HPI and otherwise negative.   BP  104/65 (BP Location: Left Arm, Patient Position: Sitting, Cuff Size: Normal)   Pulse 97   Ht 5' 5 (1.651 m)   Wt 143 lb (64.9 kg)   SpO2 97%   BMI 23.80 kg/m   Physical Examination: General: Well developed, well nourished, NAD  HEENT: OP clear, mucus membranes moist  SKIN: warm, dry. No rashes. Neuro: No focal deficits  Musculoskeletal: Muscle strength 5/5 all ext  Psychiatric: Mood and affect normal  Neck: No JVD, no carotid bruits, no thyromegaly, no lymphadenopathy.  Lungs:Clear bilaterally, no wheezes, rhonci, crackles Cardiovascular: Regular rate and rhythm. No murmurs, gallops or rubs. Abdomen:Soft. Bowel sounds present. Non-tender.  Extremities: No lower extremity edema. Pulses are 2 + in the bilateral DP/PT.  EKG:  EKG is ordered today. The ekg ordered today demonstrates  EKG Interpretation Date/Time:  Thursday August 15 2024 09:43:21 EDT Ventricular Rate:  86 PR Interval:  140 QRS Duration:  130 QT Interval:  420 QTC Calculation: 502 R Axis:   -36  Text Interpretation: Normal sinus rhythm Left axis deviation Left bundle branch block When compared with ECG of 11-Jul-2024 16:18, No significant change was found Confirmed by Verlin Bruckner [id number]) on 08/15/2024 9:52:39 AM   Recent Labs: 03/04/2024: BUN 23; Creatinine, Ser 1.43; Hemoglobin 13.2; Platelets 180; Potassium 4.1; Sodium 135   Lipid Panel    Component Value Date/Time   CHOL 172 12/15/2022 1331   TRIG 109.0 12/15/2022 1331   HDL 65.20 12/15/2022 1331  CHOLHDL 3 12/15/2022 1331   VLDL 21.8 12/15/2022 1331   LDLCALC 85 12/15/2022 1331     Wt Readings from Last 3 Encounters:  08/15/24 143 lb (64.9 kg)  07/11/24 142 lb 6 oz (64.6 kg)  03/04/24 136 lb (61.7 kg)    Assessment and Plan:   1. Chest pain: Abnormal stress test suggesting anterior wall ischemia with reduced LV systolic function on myoview. Will plan cardiac cath [facility] 08/22/24 at 10:30am. BMET and CBC today.   I have reviewed the  risks, indications, and alternatives to cardiac catheterization, possible angioplasty, and stenting with the patient. Risks include but are not limited to bleeding, infection, vascular injury, stroke, myocardial infection, arrhythmia, kidney injury, radiation-related injury in the case of prolonged fluoroscopy use, emergency cardiac surgery, and death. The patient understands the risks of serious complication is 1-2 in 1000 with diagnostic cardiac cath and 1-2% or less with angioplasty/stenting. -Expedite echo-plan later today at [address] [facility] office  Labs/ tests ordered today include:   Orders Placed This Encounter  Procedures   Basic Metabolic Panel (BMET)   CBC   EKG 12-Lead   EKG 12-Lead   Disposition:   F/U with office APP 3-4 weeks post cath    Signed, Lonni Cash, MD, [facility] 08/15/2024 10:23 AM    [facility] [address] [place], [place], [place]  [address] Phone: [phone]; Fax: [phone]

## 2024-08-16 ENCOUNTER — Ambulatory Visit: Payer: Self-pay | Admitting: Cardiovascular Disease

## 2024-08-16 LAB — BASIC METABOLIC PANEL WITH GFR
BUN/Creatinine Ratio: 15 (ref 12–28)
BUN: 22 mg/dL (ref 8–27)
CO2: 25 mmol/L (ref 20–29)
Calcium: 9.2 mg/dL (ref 8.7–10.3)
Chloride: 100 mmol/L (ref 96–106)
Creatinine, Ser: 1.46 mg/dL — ABNORMAL HIGH (ref 0.57–1.00)
Glucose: 144 mg/dL — ABNORMAL HIGH (ref 70–99)
Potassium: 4.5 mmol/L (ref 3.5–5.2)
Sodium: 141 mmol/L (ref 134–144)
eGFR: 38 mL/min/1.73 — ABNORMAL LOW (ref >59)

## 2024-08-16 LAB — CBC
Hematocrit: 41.1 % (ref 34.0–46.6)
Hemoglobin: 13.3 g/dL (ref 11.1–15.9)
MCH: 28.4 pg (ref 26.6–33.0)
MCHC: 32.4 g/dL (ref 31.5–35.7)
MCV: 88 fL (ref 79–97)
Platelets: 207 x10E3/uL (ref 150–450)
RBC: 4.69 x10E6/uL (ref 3.77–5.28)
RDW: 12.7 % (ref 11.7–15.4)
WBC: 6.6 x10E3/uL (ref 3.4–10.8)

## 2024-08-16 NOTE — Progress Notes (Signed)
 Attempted to called patient about her echo. No answer left a vm to return the call back.

## 2024-08-19 ENCOUNTER — Other Ambulatory Visit: Payer: Self-pay | Admitting: Family Medicine

## 2024-08-19 DIAGNOSIS — K219 Gastro-esophageal reflux disease without esophagitis: Secondary | ICD-10-CM

## 2024-08-19 NOTE — Telephone Encounter (Signed)
 Pt called me this evening, I went over her echo.  Let her know she does have decreased EF, in combination with her Myoview results I suspect means that she needs coronary intervention of some type- perhaps PCI.  She has a heart cath coming up this week.

## 2024-08-20 ENCOUNTER — Telehealth: Payer: Self-pay | Admitting: *Deleted

## 2024-08-20 ENCOUNTER — Telehealth: Payer: Self-pay | Admitting: Cardiovascular Disease

## 2024-08-20 NOTE — Telephone Encounter (Signed)
 Pt returning call from 10/10 regarding Echo results. Please advise

## 2024-08-20 NOTE — Telephone Encounter (Signed)
 Patient was returning call. Please advise ?

## 2024-08-20 NOTE — Telephone Encounter (Signed)
Left message and call back number.

## 2024-08-20 NOTE — Telephone Encounter (Signed)
 Cardiac Catheterization scheduled at [facility] for: Thursday August 22, 2024 10:30 AM Arrival time [facility] Main Entrance A at: 5:30 AM-pre-procedure hydration Per Dr Brennan flow rate for hydration 50 cc per hour given her EF.  Diet: -Nothing to eat after midnight.  Hydration: -May drink clear liquids until 2 hours before the procedure.  Approved liquids: Water, clear tea, black coffee, fruit juices-non-citric and without pulp,Gatorade, plain Jello/popsicles.  No PO hydration (16 oz water) 2 hours prior to procedure-going in early for IV hydration.  Medication instructions: -Usual morning medications can be taken including aspirin  81 mg.  Plan to go home the same day, you will only stay overnight if medically necessary.  You must have responsible adult to drive you home.  Someone must be with you the first 24 hours after you arrive home.  Reviewed procedure instructions/pre-procedure hydration with patient.

## 2024-08-20 NOTE — Telephone Encounter (Signed)
 Left message to call back.

## 2024-08-21 NOTE — Telephone Encounter (Signed)
 Left message for pt to call.

## 2024-08-22 ENCOUNTER — Ambulatory Visit (HOSPITAL_COMMUNITY)
Admission: RE | Admit: 2024-08-22 | Discharge: 2024-08-22 | Disposition: A | Discharge status: Home / Self Care | Attending: Cardiovascular Disease | Admitting: Cardiovascular Disease

## 2024-08-22 ENCOUNTER — Encounter (HOSPITAL_COMMUNITY)
Admission: RE | Disposition: A | Discharge status: Home / Self Care | Payer: Self-pay | Source: Home / Self Care | Attending: Cardiovascular Disease

## 2024-08-22 ENCOUNTER — Encounter (HOSPITAL_COMMUNITY): Payer: Self-pay | Admitting: Cardiovascular Disease

## 2024-08-22 ENCOUNTER — Other Ambulatory Visit: Payer: Self-pay

## 2024-08-22 DIAGNOSIS — R9439 Abnormal result of other cardiovascular function study: Secondary | ICD-10-CM | POA: Insufficient documentation

## 2024-08-22 DIAGNOSIS — I428 Other cardiomyopathies: Secondary | ICD-10-CM | POA: Diagnosis not present

## 2024-08-22 DIAGNOSIS — E1122 Type 2 diabetes mellitus with diabetic chronic kidney disease: Secondary | ICD-10-CM | POA: Insufficient documentation

## 2024-08-22 DIAGNOSIS — N183 Chronic kidney disease, stage 3 unspecified: Secondary | ICD-10-CM | POA: Insufficient documentation

## 2024-08-22 DIAGNOSIS — R079 Chest pain, unspecified: Secondary | ICD-10-CM

## 2024-08-22 DIAGNOSIS — Z7984 Long term (current) use of oral hypoglycemic drugs: Secondary | ICD-10-CM | POA: Insufficient documentation

## 2024-08-22 DIAGNOSIS — I129 Hypertensive chronic kidney disease with stage 1 through stage 4 chronic kidney disease, or unspecified chronic kidney disease: Secondary | ICD-10-CM | POA: Diagnosis not present

## 2024-08-22 HISTORY — PX: LEFT HEART CATH AND CORONARY ANGIOGRAPHY: CATH118249

## 2024-08-22 LAB — GLUCOSE, CAPILLARY
Glucose-Capillary: 173 mg/dL — ABNORMAL HIGH (ref 70–99)
Glucose-Capillary: 104 mg/dL — ABNORMAL HIGH (ref 70–99)

## 2024-08-22 SURGERY — LEFT HEART CATH AND CORONARY ANGIOGRAPHY
Anesthesia: LOCAL

## 2024-08-22 MED ORDER — SODIUM CHLORIDE 0.9% FLUSH: 3.0000 mL | Freq: Two times a day (BID) | INTRAVENOUS | Status: DC

## 2024-08-22 MED ORDER — SODIUM CHLORIDE 0.9 % IV SOLN: INTRAVENOUS | Status: DC

## 2024-08-22 MED ORDER — HEPARIN SODIUM (PORCINE) 1000 UNIT/ML IJ SOLN
INTRAMUSCULAR | Status: DC | PRN
  Administered 2024-08-22: 3000 [IU] via INTRAVENOUS

## 2024-08-22 MED ORDER — LIDOCAINE HCL (PF) 1 % IJ SOLN
INTRAMUSCULAR | Status: DC | PRN
  Administered 2024-08-22: 2 mL

## 2024-08-22 MED ORDER — HYDRALAZINE HCL 20 MG/ML IJ SOLN: 10.0000 mg | INTRAMUSCULAR | Status: DC | PRN

## 2024-08-22 MED ORDER — ONDANSETRON HCL 4 MG/2ML IJ SOLN: 4.0000 mg | Freq: Four times a day (QID) | INTRAMUSCULAR | Status: DC | PRN

## 2024-08-22 MED ORDER — HEPARIN (PORCINE) IN NACL 1000-0.9 UT/500ML-% IV SOLN
INTRAVENOUS | Status: DC | PRN
  Administered 2024-08-22: 1000 mL

## 2024-08-22 MED ORDER — SODIUM CHLORIDE 0.9 % IV SOLN: 250.0000 mL | INTRAVENOUS | Status: DC | PRN

## 2024-08-22 MED ORDER — MIDAZOLAM HCL 2 MG/2ML IJ SOLN
INTRAMUSCULAR | Status: DC | PRN
  Administered 2024-08-22: 1 mg via INTRAVENOUS

## 2024-08-22 MED ORDER — LABETALOL HCL 5 MG/ML IV SOLN: 10.0000 mg | INTRAVENOUS | Status: DC | PRN

## 2024-08-22 MED ORDER — HEPARIN SODIUM (PORCINE) 1000 UNIT/ML IJ SOLN
INTRAMUSCULAR | Status: AC
  Filled 2024-08-22: qty 10

## 2024-08-22 MED ORDER — SODIUM CHLORIDE 0.9 % IV SOLN: INTRAVENOUS | Status: AC

## 2024-08-22 MED ORDER — ASPIRIN 81 MG PO CHEW: 81.0000 mg | CHEWABLE_TABLET | ORAL | Status: DC

## 2024-08-22 MED ORDER — MIDAZOLAM HCL 2 MG/2ML IJ SOLN
INTRAMUSCULAR | Status: AC
  Filled 2024-08-22: qty 2

## 2024-08-22 MED ORDER — FENTANYL CITRATE (PF) 100 MCG/2ML IJ SOLN
INTRAMUSCULAR | Status: AC
  Filled 2024-08-22: qty 2

## 2024-08-22 MED ORDER — LIDOCAINE HCL (PF) 1 % IJ SOLN
INTRAMUSCULAR | Status: AC
Start: 2024-08-22 — End: 2024-08-22
  Filled 2024-08-22: qty 30

## 2024-08-22 MED ORDER — SODIUM CHLORIDE 0.9% FLUSH: 3.0000 mL | INTRAVENOUS | Status: DC | PRN

## 2024-08-22 MED ORDER — ACETAMINOPHEN 325 MG PO TABS
650.0000 mg | ORAL_TABLET | ORAL | Status: DC | PRN
Start: 2024-08-22 — End: 2024-08-22

## 2024-08-22 MED ORDER — VERAPAMIL HCL 2.5 MG/ML IV SOLN
INTRAVENOUS | Status: AC
  Filled 2024-08-22: qty 2

## 2024-08-22 MED ORDER — IOHEXOL 350 MG/ML SOLN
INTRAVENOUS | Status: DC | PRN
  Administered 2024-08-22: 25 mL

## 2024-08-22 MED ORDER — FENTANYL CITRATE (PF) 100 MCG/2ML IJ SOLN
INTRAMUSCULAR | Status: DC | PRN
  Administered 2024-08-22: 25 ug via INTRAVENOUS

## 2024-08-22 MED ORDER — VERAPAMIL HCL 2.5 MG/ML IV SOLN
INTRAVENOUS | Status: DC | PRN
  Administered 2024-08-22: 10 mL via INTRA_ARTERIAL

## 2024-08-22 SURGICAL SUPPLY — 6 items
CATH 5FR JL3.5 JR4 ANG PIG MP (CATHETERS) IMPLANT
DEVICE RAD COMP TR BAND LRG (VASCULAR PRODUCTS) IMPLANT
GLIDESHEATH SLEND SS 6F .021 (SHEATH) IMPLANT
GUIDEWIRE INQWIRE 1.5J.035X260 (WIRE) IMPLANT
PACK CARDIAC CATHETERIZATION (CUSTOM PROCEDURE TRAY) ×1 IMPLANT
SET ATX-X65L (MISCELLANEOUS) IMPLANT

## 2024-08-22 NOTE — Interval H&P Note (Signed)
 History and Physical Interval Note:  08/22/2024 10:19 AM  Abigail [name]  has presented today for surgery, with the diagnosis of abnormal stress test.  The various methods of treatment have been discussed with the patient and family. After consideration of risks, benefits and other options for treatment, the patient has consented to  Procedure(s): LEFT HEART CATH AND CORONARY ANGIOGRAPHY (N/A) as a surgical intervention.  The patient's history has been reviewed, patient examined, no change in status, stable for surgery.  I have reviewed the patient's chart and labs.  Questions were answered to the patient's satisfaction.    Cath Lab Visit (complete for each Cath Lab visit)  Clinical Evaluation Leading to the Procedure:   ACS: No.  Non-ACS:    Anginal Classification: CCS II  Anti-ischemic medical therapy: Minimal Therapy (1 class of medications)  Non-Invasive Test Results: High-risk stress test findings: cardiac mortality >3%/year  Prior CABG: No previous CABG        Abigail [name]

## 2024-08-22 NOTE — Discharge Instructions (Signed)

## 2024-08-22 NOTE — Progress Notes (Signed)
 Patient and patient daughter given discharge instructions, education provided no further questions at this time. Patient able to ambulate and void before discharge. Able to tolerate PO intake. Patient site is clean, dry, intact and soft upon discharge.

## 2024-08-27 ENCOUNTER — Other Ambulatory Visit (HOSPITAL_BASED_OUTPATIENT_CLINIC_OR_DEPARTMENT_OTHER): Payer: Self-pay | Admitting: Family Medicine

## 2024-08-27 DIAGNOSIS — Z1231 Encounter for screening mammogram for malignant neoplasm of breast: Secondary | ICD-10-CM

## 2024-08-27 NOTE — Progress Notes (Signed)
 Cardiology Office Note   Date:  09/10/2024  ID:  Abigail [name], DOB 06/08/1951, MRN [id number] PCP: Watt Harlene BROCKS, MD  [facility] Providers Cardiologist:  Maude Emmer, MD   History of Present Illness Abigail [name] is a 73 y.o. female with a past medical history of anemia, anxiety, depression, CKD, diabetes, GERD, LBBB, hypertension, hyperlipidemia.  Followed by Dr. Emmer.  Presents today for follow-up post cath.  Patient was previously evaluated in 08/2017 for evaluation of chest pain.  Underwent coronary CTA 08/18/17 that showed a coronary calcium  score of 0, no evidence of CAD.  Echocardiogram 08/18/17 showed EF 55-60%, no regional wall motion abnormalities, mild MR.  Later underwent nuclear stress test 08/13/2024 showed partially reversible apical to mid anteroseptal/anterior perfusion defect, consistent with infarct with peri-infarct ischemia.  Could possibly represent artifact as there were no coronary calcifications noted on CT.  However, LV systolic function was severely reduced.  Patient then underwent echocardiogram on 08/15/2024 that showed EF 25-30%, grade 2 diastolic dysfunction, normal RV systolic function, mild MR.  Patient underwent left heart catheterization 08/22/2024 that showed no angiographic evidence of CAD, normal LV filling pressures.  There was no indication for further ischemic workup.  Today, patient presents for a follow-up appointment.  Reports that she is overall been doing well from a cardiac perspective.  Denies chest pain.  Occasionally will get a bit short of breath if she is walking up a hill.  Able to walk on flat ground, do housework without shortness of breath.  No shortness of breath at rest, no orthopnea.  Denies lower extremity swelling.  No palpitations.  Patient does have a family history of heart problems.  Tells me that her mother died of congestive heart failure.  Her brother has a defibrillator.  Her father also had heart problems  but she is not sure what they are.  Patient drinks alcohol socially, denies history of heavy alcohol use.  No history of drug use.    Patient has been on Farxiga  for her kidneys.  She is currently on propranolol  for history of tremors.  She was on losartan  in the past and cannot remember exactly why it was discontinued.  Studies Reviewed      Cardiac Studies & Procedures   ______________________________________________________________________________________________ CARDIAC CATHETERIZATION  CARDIAC CATHETERIZATION 08/22/2024  Conclusion No angiographic evidence of CAD Normal LV filling pressures  No further ischemic workup.  Findings Coronary Findings Diagnostic  Dominance: Right  Left Anterior Descending Vessel is large.  Left Circumflex Vessel is large.  Right Coronary Artery Vessel is large.  Intervention  No interventions have been documented.   STRESS TESTS  MYOCARDIAL PERFUSION IMAGING 08/13/2024  Interpretation Summary   Partially reversible apical to mid anteroseptal/anterior perfusion defect, consistent with infarct with peri-infarct ischemia.  Could represent artifact, as no coronary calcifcations on CT.  However, LV systolic function is severely reduced, EF 26%.  Overall, while area of ischemia is small, study is high risk due to severe systolic dysfunction   Findings are consistent with infarction with peri-infarct ischemia. The study is high risk.   LV perfusion is abnormal. There is evidence of ischemia. There is evidence of infarction. Defect 1: There is a medium defect with moderate reduction in uptake present in the apical to mid anterior and anteroseptal location(s) that is partially reversible. There is abnormal wall motion in the defect area. Consistent with peri-infarct ischemia.   Left ventricular function is abnormal. Global function is severely reduced. Nuclear stress EF: 26%. The left  ventricular ejection fraction is severely decreased (<30%). End  diastolic cavity size is severely enlarged. End systolic cavity size is severely enlarged.   CT images were obtained for attenuation correction and were examined for the presence of coronary calcium  when appropriate.   Coronary calcium  was absent on the attenuation correction CT images.   Prior study not available for comparison.   ECHOCARDIOGRAM  ECHOCARDIOGRAM COMPLETE 08/15/2024  Narrative ECHOCARDIOGRAM REPORT    Patient Name:   Abigail [name] Date of Exam: 08/15/2024 Medical Rec #:  [id number]         Height:       65.0 in Accession #:    [id number]        Weight:       143.0 lb Date of Birth:  1951-09-18        BSA:          1.715 m Patient Age:    72 years          BP:           104/65 mmHg Patient Gender: F                 HR:           82 bpm. Exam Location:  [facility]  Procedure: 2D Echo, Intracardiac Opacification Agent, Cardiac Doppler and Color Doppler (Both Spectral and Color Flow Doppler were utilized during procedure).  Indications:    R94.39 Abnormal Nuclear Stress Test  History:        Patient has no prior history of Echocardiogram examinations. Arrythmias:LBBB, Signs/Symptoms:Chest Pain; Risk Factors:Hypertension, Diabetes, Dyslipidemia and Non-Smoker.  Sonographer:    Gordon Miyamoto Referring Phys: [id number] MAUDE JAYSON EMMER   Sonographer Comments: Technically difficult study due to poor echo windows. reduced lvef IMPRESSIONS   1. Left ventricular ejection fraction, by estimation, is 25 to 30%. Left ventricular ejection fraction by PLAX is 18 %. The left ventricle has severely decreased function. The left ventricle demonstrates global hypokinesis. Left ventricular diastolic parameters are consistent with Grade II diastolic dysfunction (pseudonormalization). 2. Right ventricular systolic function is normal. The right ventricular size is normal. Tricuspid regurgitation signal is inadequate for assessing PA pressure. 3. The mitral valve is normal in structure. Mild  mitral valve regurgitation. No evidence of mitral stenosis. 4. The aortic valve is tricuspid. Aortic valve regurgitation is not visualized. No aortic stenosis is present. 5. The inferior vena cava is normal in size with greater than 50% respiratory variability, suggesting right atrial pressure of 3 mmHg.  FINDINGS Left Ventricle: Left ventricular ejection fraction, by estimation, is 25 to 30%. Left ventricular ejection fraction by PLAX is 18 %. The left ventricle has severely decreased function. The left ventricle demonstrates global hypokinesis. Definity contrast agent was given IV to delineate the left ventricular endocardial borders. Strain was performed and the global longitudinal strain is indeterminate. The left ventricular internal cavity size was normal in size. There is no left ventricular hypertrophy. Left ventricular diastolic parameters are consistent with Grade II diastolic dysfunction (pseudonormalization).  Right Ventricle: The right ventricular size is normal. No increase in right ventricular wall thickness. Right ventricular systolic function is normal. Tricuspid regurgitation signal is inadequate for assessing PA pressure.  Left Atrium: Left atrial size was normal in size.  Right Atrium: Right atrial size was normal in size.  Pericardium: There is no evidence of pericardial effusion.  Mitral Valve: The mitral valve is normal in structure. Mild mitral valve regurgitation. No evidence of mitral valve stenosis.  Tricuspid  Valve: The tricuspid valve is normal in structure. Tricuspid valve regurgitation is not demonstrated. No evidence of tricuspid stenosis.  Aortic Valve: The aortic valve is tricuspid. Aortic valve regurgitation is not visualized. No aortic stenosis is present. Aortic valve mean gradient measures 2.0 mmHg. Aortic valve peak gradient measures 4.1 mmHg. Aortic valve area, by VTI measures 2.16 cm.  Pulmonic Valve: The pulmonic valve was normal in structure.  Pulmonic valve regurgitation is not visualized. No evidence of pulmonic stenosis.  Aorta: The aortic root is normal in size and structure.  Venous: The inferior vena cava is normal in size with greater than 50% respiratory variability, suggesting right atrial pressure of 3 mmHg.  IAS/Shunts: No atrial level shunt detected by color flow Doppler.  Additional Comments: 3D was performed not requiring image post processing on an independent workstation and was indeterminate.   LEFT VENTRICLE PLAX 2D LV EF:         Left            Diastology ventricular     LV e' medial:    4.90 cm/s ejection        LV E/e' medial:  20.6 fraction by     LV e' lateral:   5.77 cm/s PLAX is 18      LV E/e' lateral: 17.5 %. LVIDd:         4.90 cm LVIDs:         4.50 cm LV PW:         0.80 cm LV IVS:        0.90 cm LVOT diam:     2.00 cm LV SV:         43 LV SV Index:   25 LVOT Area:     3.14 cm  LV Volumes (MOD) LV vol d, MOD    39.3 ml A2C: LV vol d, MOD    96.1 ml A4C: LV vol s, MOD    34.8 ml A2C: LV vol s, MOD    62.1 ml A4C: LV SV MOD A2C:   4.5 ml LV SV MOD A4C:   96.1 ml LV SV MOD BP:    37.8 ml  RIGHT VENTRICLE RV Basal diam:  2.10 cm RV Mid diam:    1.50 cm RV S prime:     12.00 cm/s TAPSE (M-mode): 1.7 cm  LEFT ATRIUM             Index        RIGHT ATRIUM          Index LA diam:        2.70 cm 1.57 cm/m   RA Area:     8.76 cm LA Vol (A2C):   29.4 ml 17.14 ml/m  RA Volume:   13.80 ml 8.05 ml/m LA Vol (A4C):   18.8 ml 10.96 ml/m LA Biplane Vol: 24.5 ml 14.28 ml/m AORTIC VALVE AV Area (Vmax):    1.99 cm AV Area (Vmean):   1.92 cm AV Area (VTI):     2.16 cm AV Vmax:           101.00 cm/s AV Vmean:          67.800 cm/s AV VTI:            0.199 m AV Peak Grad:      4.1 mmHg AV Mean Grad:      2.0 mmHg LVOT Vmax:         64.00 cm/s LVOT Vmean:  41.500 cm/s LVOT VTI:          0.137 m LVOT/AV VTI ratio: 0.69  AORTA Ao Sinus diam: 2.30 cm Ao Asc diam:   2.50  cm  MITRAL VALVE MV Area (PHT): 7.74 cm     SHUNTS MV Decel Time: 98 msec      Systemic VTI:  0.14 m MV E velocity: 101.00 cm/s  Systemic Diam: 2.00 cm MV A velocity: 43.50 cm/s MV E/A ratio:  2.32  Evalene Lunger MD Electronically signed by Evalene Lunger MD Signature Date/Time: 08/15/2024/6:50:20 PM    Final          ______________________________________________________________________________________________      Risk Assessment/Calculations           Physical Exam VS:  BP 114/62   Pulse 83   Ht 5' 5 (1.651 m)   Wt 140 lb 6.4 oz (63.7 kg)   SpO2 95%   BMI 23.36 kg/m        Wt Readings from Last 3 Encounters:  09/10/24 140 lb 6.4 oz (63.7 kg)  08/28/24 140 lb 9.6 oz (63.8 kg)  08/22/24 142 lb (64.4 kg)    GEN: Well nourished, well developed in no acute distress. Sitting comfortably in the chair  NECK: No JVD  CARDIAC:  RRR, no murmurs, rubs, gallops. Radial pulses 2+ bilaterally. Right radial cath site soft, nontender  RESPIRATORY:  Clear to auscultation without rales, wheezing or rhonchi. Normal WOB on room air   ABDOMEN: Soft, non-tender, non-distended EXTREMITIES:  No edema in BLE; No deformity   ASSESSMENT AND PLAN  NICM LBBB - Echocardiogram 10/9 showed EF 25-30%, grade 2 diastolic dysfunction, normal RV systolic function, mild MR - Left heart catheterization 08/22/2024 showed no angiographic evidence of CAD, normal LV filling pressures - Etiology of heart failure currently unknown.  Patient does have LBBB on EKG with QRS duration 130 ms. Possible that this could be contributing - No history of heavy alcohol use or drug use. BP has been well controlled.   - Patient's mother had congestive heart failure and her brother has what sounds like a defibrillator.  Possible genetic component?  I asked patient to reach out to her brother to better determine what his cardiac history is - Patient is euvolemic on exam. Denies DOE, orthopnea, ankle edema.  -  Continue Farxiga  10 mg daily   - Patient currently on propranolol  60 mg daily for tremors. This is prescribed by her PCP. OK to continue for now  - Start losartan  12.5 mg daily  - Creatinine 1.46 on 10/9. Ordered BMP to be drawn in 2 weeks after starting losartan    HTN  - BP well controlled  - Continue propranolol  60 mg daily (on for tremors) - Add losartan  12.5 mg daily as above   HLD  - Lipid panel from 08/2024 showed LDL 59, HDL 54, triglycerides 108, total cholesterol 162  - continue lipitor 20 mg daily   Type 2 DM  - Followed by PCP. A1c 7.5 in 08/2024  - Continue farxiga , mounjaro     Dispo: Follow up in 2-3 months with Dr. Delford   Signed, Rollo FABIENE Louder, PA-C

## 2024-08-27 NOTE — Progress Notes (Addendum)
 [facility] [employer] [address], [address] [place], [place] [address] [phone]  Date:  08/28/2024   Name:  Abigail [name]   DOB:  [birth year]   MRN:  [id number]  PCP:  Watt Harlene BROCKS, MD    Chief Complaint: Follow-up (Discuss lab results /Pt is requesting a ESA letter Florette placard )   History of Present Illness:  Abigail [name] is a 73 y.o. very pleasant female patient who presents with the following:  Most recent visit with myself 4/25-  History of diabetes, hyperlipidemia, hypertension, chronic renal insufficiency with nephrology follow-up, essential tremor seen by neurology  Patient seen today to discuss her cardiovascular procedure.  She was admitted with chest pain and had a left heart cath on 10/16 following an abnormal stress test It looks like her cath was normal- she did not require any intervention  Her Myoview  also showed decreased LV function and she had an echocardiogram 10/9-her EF was significantly reduced.  Previous echo 2018 looked fine 1. Left ventricular ejection fraction, by estimation, is 25 to 30%. Left  ventricular ejection fraction by PLAX is 18 %. The left ventricle has  severely decreased function. The left ventricle demonstrates global  hypokinesis. Left ventricular diastolic  parameters are consistent with Grade II diastolic dysfunction  (pseudonormalization).   Discussed the use of AI scribe software for clinical note transcription with the patient, who gave verbal consent to proceed.  History of Present Illness Abigail [name] is a 73 year old female with diabetes and heart disease who presents for a follow-up visit to discuss recent test results and medication management.  She is concerned about her diabetes management, noting a blood sugar level of 140 mg/dL this morning. She is currently taking Mounjaro  and Farxiga  for diabetes. Her last A1c was in the 7s, and she recently  started Mounjaro , questioning its sufficiency for diabetes control.  She recently underwent a heart catheterization. She reports being told her coronary arteries are in good condition, but that her heart muscle is weak. She is taking Lipitor for cholesterol management.  She is taking omeprazole  for gastrointestinal issues and is concerned about its long-term effects on her heart and kidneys. No gastrointestinal pain in the last couple of weeks.  She is on Prozac  for anxiety and finds her emotional support animal, a dog named Cam, helpful in managing her anxiety. She lives alone and finds comfort in her pet's presence.  Her current medications include Mounjaro , Farxiga , Lipitor, omeprazole , and Prozac . She is due for a flu shot and has not yet received it this year.  Give flu shot today Eye exam is UTD  Mammo is scheduled   Patient Active Problem List   Diagnosis Date Noted   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/29/2020   Essential hypertension    Gastroesophageal reflux disease without esophagitis    Microscopic hematuria 09/16/2015   Trimalleolar fracture of ankle, closed 11/17/2014   Hyperlipidemia associated with type 2 diabetes mellitus (HCC) 07/24/2013   Chest pain 10/23/2012   Type 2 diabetes mellitus without complication, without long-term current use of insulin  (HCC) 10/08/2012   Anxiety 10/08/2012    Past Medical History:  Diagnosis Date   Anemia, unspecified    Anxiety    CKD (chronic kidney disease), stage III (HCC)    Depression    Diabetes mellitus type II, non insulin  dependent (HCC)    On Ozempic  and Farxiga    GERD (gastroesophageal reflux disease)    Hematuria  Hyperlipidemia    Hypertension    Mild mitral regurgitation 08/2017   Essentially normal-mild MR on echo.   Normal coronary arteries    Coronary CTA: No evidence of CAD.  Coronary calcium  score 0.   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   HAMMER TOE SURGERY     LEFT HEART CATH AND CORONARY ANGIOGRAPHY N/A 08/22/2024   Procedure: LEFT HEART CATH AND CORONARY ANGIOGRAPHY;  Surgeon: Verlin Lonni BIRCH, MD;  Location: [facility] INVASIVE CV LAB;  Service: Cardiovascular;  Laterality: N/A;   ORIF ANKLE FRACTURE Left 11/17/2014   Procedure: OPEN REDUCTION INTERNAL FIXATION (ORIF) LEFT TRIMALLEOLAR ANKLE FRACTURE;  Surgeon: Kay Ozell Cummins, MD;  Location: [facility] OR;  Service: Orthopedics;  Laterality: Left;   OVARIAN CYST REMOVAL      Social History   Tobacco Use   Smoking status: Never   Smokeless tobacco: Never  Vaping Use   Vaping status: Never Used  Substance Use Topics   Alcohol use: Yes    Alcohol/week: 1.0 standard drink of alcohol    Types: 1 Glasses of wine per week    Comment: occ   Drug use: No    Family History  Problem Relation Age of Onset   Tremor Mother    Diabetes Mother    Hyperlipidemia Mother    Diabetes Father    Heart disease Father 85       AMI x 2; first AMI age 14   Stroke Father    Hypertension Father    Other Sister 65       Died in a car accident   Hyperlipidemia Brother    AAA (abdominal aortic aneurysm) Brother    Congestive Heart Failure Brother        s/p ICD   Coronary artery disease Brother        defibrillator; CHF   Kidney cancer Brother        Status post nephrectomy   Parkinson's disease Neg Hx     Allergies  Allergen Reactions   Pneumovax 23 [Pneumococcal Vac Polyvalent] Other (See Comments)    Pt had severe arm pain   Amaryl  [Glimepiride ] Nausea Only   Cephalexin Hives   Sulfa Antibiotics Rash    Medication list has been reviewed and updated.  Current Outpatient Medications on File Prior to Visit   Medication Sig Dispense Refill   Ascorbic Acid (VITAMIN C) 1000 MG tablet Take 1,000 mg by mouth daily.     atorvastatin  (LIPITOR) 20 MG tablet TAKE 1 TABLET (20 MG TOTAL) BY MOUTH DAILY. 90 tablet 0   blood glucose meter kit and supplies Dispense based on patient and insurance preference. Use up to four times daily as directed. (FOR ICD-10 E10.9, E11.9). 1 each 0   Blood Glucose Monitoring Suppl (ONE TOUCH ULTRA 2) w/Device KIT by Does not apply route.     Cholecalciferol (VITAMIN D3) 50 MCG (2000 UT) CAPS Take 2,000 Units by mouth daily.     cyanocobalamin  (VITAMIN B12) 1000 MCG tablet Take 1,000 mcg by mouth daily.     FARXIGA  10 MG TABS tablet Take 1 tablet (10 mg total) by mouth daily. For [place]  and [place] 90 tablet 3   fluticasone  (FLONASE ) 50 MCG/ACT nasal spray Place 2 sprays into both nostrils daily. 16 g 1   glucose blood test strip Check blood sugars once daily . One Touch Ultra 2 100 each 12   meclizine  (ANTIVERT ) 25 MG tablet Take 1 tablet (25 mg total) by mouth 3 (three) times daily as needed for dizziness. 30 tablet 0   Multiple Vitamins-Minerals (MULTIVITAMIN GUMMIES ADULT PO) Take 1 tablet by mouth daily.     nitroGLYCERIN  (NITROSTAT ) 0.4 MG SL tablet PLACE 1 TABLET UNDER THE TONGUE EVERY 5 MINUTES AS NEEDED FOR CHEST PAIN 25 tablet 2   omeprazole  (PRILOSEC) 20 MG capsule TAKE 1 CAPSULE BY MOUTH DAILY. NEEDS APPT 90 capsule 1   propranolol  ER (INDERAL  LA) 60 MG 24 hr capsule TAKE 1 CAPSULE BY MOUTH EVERY DAY 90 capsule 1   tirzepatide  (MOUNJARO ) 10 MG/0.5ML Pen Inject 10 mg into the skin once a week. 6 mL 1   traZODone  (DESYREL ) 150 MG tablet TAKE 1 TABLET BY MOUTH AT BEDTIME. 90 tablet 0   zinc gluconate 50 MG tablet Take 50 mg by mouth daily.     sucralfate  (CARAFATE ) 1 g tablet Take 1 tablet (1 g total) by mouth 4 (four) times daily -  with meals and at bedtime. (Patient not taking: Reported on 08/28/2024) 40 tablet 0   No current facility-administered  medications on file prior to visit.    Review of Systems:  As per HPI- otherwise negative.  Wt Readings from Last 3 Encounters:  08/28/24 140 lb 9.6 oz (63.8 kg)  08/22/24 142 lb (64.4 kg)  08/15/24 143 lb (64.9 kg)      Physical Examination: Vitals:   08/28/24 1532  BP: 110/64  Pulse: 96  SpO2: 98%   Vitals:   08/28/24 1532  Weight: 140 lb 9.6 oz (63.8 kg)  Height: 5' 5 (1.651 m)   Body mass index is 23.4 kg/m. Ideal Body Weight: Weight in (lb) to have BMI = 25: 149.9  GEN: no acute distress. Normal weight, looks well  HEENT: Atraumatic, Normocephalic.  Ears and Nose: No external deformity. CV: RRR, No M/G/R. No JVD. No thrill. No extra heart sounds. PULM: CTA B, no wheezes, crackles, rhonchi. No retractions. No resp. distress. No accessory muscle use. ABD: S, NT, ND, +BS. No rebound. No HSM. EXTR: No c/c/e PSYCH: Normally interactive. Conversant.  Foot exam normal   Assessment and Plan: Type 2 diabetes mellitus without complication, without long-term current use of insulin  (HCC) - Plan: Hemoglobin A1c  Adjustment reaction with anxiety and depression - Plan: FLUoxetine  (PROZAC ) 40 MG capsule  Dyslipidemia - Plan: Lipid panel  Need for influenza vaccination - Plan: Flu vaccine HIGH DOSE PF(Fluzone Trivalent)  Assessment & Plan Type 2 Diabetes Mellitus Morning blood sugar 140 mg/dL. Previous A1c acceptable in the 7s. Mounjaro  and Farxiga  appropriate for management. Farxiga  benefits renal and cardiovascular health. - Check A1c today. - Consider increasing Mounjaro  dosage based on A1c results.  Coronary Artery Disease Recent cardiac catheterization normal. Myocardial weakness possibly due to past viral infection. Cardiologist recommended increased cardiovascular exercise.  Gastroesophageal Reflux Disease (GERD) On omeprazole . Concerns about long-term use affecting calcium  absorption and bone density. No cardiac issues with omeprazole . Consider  gastroenterologist referral if symptoms persist. - Continue omeprazole  as needed. - Consider gastroenterologist referral if symptoms persist.  Anxiety Alleviated by emotional support animal. - Provide letter for emotional support animal for DMV.  General Health Maintenance Due for  flu shot. Up to date with eye exams, dental visits, and mammograms. - Administer flu shot today.  Follow-up Managing multiple chronic conditions. No recent lipid profile available. - Check cholesterol levels today. - Refill fluoxetine  prescription.  Signed Harlene Schroeder, MD  Addendum 10/23, received labs as below.  Message to patient Results for orders placed or performed in visit on 08/28/24  Hemoglobin A1c   Collection Time: 08/28/24  4:04 PM  Result Value Ref Range   Hgb A1c MFr Bld 7.5 (H) 4.6 - 6.5 %  Lipid panel   Collection Time: 08/28/24  4:04 PM  Result Value Ref Range   Cholesterol 162 0 - 200 mg/dL   Triglycerides 751.9 (H) 0.0 - 149.0 mg/dL   HDL 45.69 >60.99 mg/dL   VLDL 50.3 (H) 0.0 - 59.9 mg/dL   LDL Cholesterol 59 0 - 99 mg/dL   Total CHOL/HDL Ratio 3    NonHDL 108.18     "

## 2024-08-28 ENCOUNTER — Encounter: Payer: Self-pay | Admitting: Family Medicine

## 2024-08-28 ENCOUNTER — Ambulatory Visit (INDEPENDENT_AMBULATORY_CARE_PROVIDER_SITE_OTHER): Admitting: Family Medicine

## 2024-08-28 VITALS — BP 110/64 | HR 96 | Ht 65.0 in | Wt 140.6 lb

## 2024-08-28 DIAGNOSIS — Z23 Encounter for immunization: Secondary | ICD-10-CM | POA: Diagnosis not present

## 2024-08-28 DIAGNOSIS — E785 Hyperlipidemia, unspecified: Secondary | ICD-10-CM | POA: Diagnosis not present

## 2024-08-28 DIAGNOSIS — F4323 Adjustment disorder with mixed anxiety and depressed mood: Secondary | ICD-10-CM

## 2024-08-28 DIAGNOSIS — E119 Type 2 diabetes mellitus without complications: Secondary | ICD-10-CM

## 2024-08-28 DIAGNOSIS — Z7984 Long term (current) use of oral hypoglycemic drugs: Secondary | ICD-10-CM

## 2024-08-28 DIAGNOSIS — Z7985 Long-term (current) use of injectable non-insulin antidiabetic drugs: Secondary | ICD-10-CM | POA: Diagnosis not present

## 2024-08-28 MED ORDER — FLUOXETINE HCL 40 MG PO CAPS: 40.0000 mg | ORAL_CAPSULE | Freq: Every day | ORAL | 3 refills | Status: AC

## 2024-08-28 NOTE — Patient Instructions (Addendum)
 Good to see you today!  I will be in touch with your labs It might be smart to see your GI doc now that we know you are not having heart pain  Flu shot today Recommend a covid booster this fall Also recommend one dose of RSV vaccine at your pharmacy  Take care!

## 2024-08-29 ENCOUNTER — Encounter: Payer: Self-pay | Admitting: Family Medicine

## 2024-08-29 LAB — LIPID PANEL
Cholesterol: 162 mg/dL (ref 0–200)
HDL: 54.3 mg/dL
LDL Cholesterol: 59 mg/dL (ref 0–99)
NonHDL: 108.18
Total CHOL/HDL Ratio: 3
Triglycerides: 248 mg/dL — ABNORMAL HIGH (ref 0.0–149.0)
VLDL: 49.6 mg/dL — ABNORMAL HIGH (ref 0.0–40.0)

## 2024-08-29 LAB — HEMOGLOBIN A1C: Hgb A1c MFr Bld: 7.5 % — ABNORMAL HIGH (ref 4.6–6.5)

## 2024-09-06 NOTE — Progress Notes (Signed)
 Abigail [name]                                          MRN: [id number]   09/06/2024   The [facility]BCI Quality Team Specialist reviewed this patient medical record for the purposes of chart review for care gap closure. The following were reviewed: chart review for care gap closure-kidney health evaluation for diabetes:eGFR  and uACR.    VBCI Quality Team

## 2024-09-09 ENCOUNTER — Other Ambulatory Visit (HOSPITAL_COMMUNITY): Payer: Self-pay

## 2024-09-09 NOTE — Telephone Encounter (Signed)
 PAP: Application for Marcelline Deist has been submitted to [employer] ([employer]), via fax

## 2024-09-10 ENCOUNTER — Ambulatory Visit (HOSPITAL_BASED_OUTPATIENT_CLINIC_OR_DEPARTMENT_OTHER)
Admission: RE | Admit: 2024-09-10 | Discharge: 2024-09-10 | Disposition: A | Discharge status: Home / Self Care | Source: Ambulatory Visit | Attending: Family Medicine | Admitting: Family Medicine

## 2024-09-10 ENCOUNTER — Encounter (HOSPITAL_BASED_OUTPATIENT_CLINIC_OR_DEPARTMENT_OTHER): Payer: Self-pay

## 2024-09-10 ENCOUNTER — Encounter: Payer: Self-pay | Admitting: Cardiology

## 2024-09-10 ENCOUNTER — Ambulatory Visit: Attending: Cardiology | Admitting: Cardiology

## 2024-09-10 VITALS — BP 114/62 | HR 83 | Ht 65.0 in | Wt 140.4 lb

## 2024-09-10 DIAGNOSIS — Z1231 Encounter for screening mammogram for malignant neoplasm of breast: Secondary | ICD-10-CM | POA: Insufficient documentation

## 2024-09-10 DIAGNOSIS — E119 Type 2 diabetes mellitus without complications: Secondary | ICD-10-CM

## 2024-09-10 DIAGNOSIS — I10 Essential (primary) hypertension: Secondary | ICD-10-CM | POA: Diagnosis not present

## 2024-09-10 DIAGNOSIS — Z79899 Other long term (current) drug therapy: Secondary | ICD-10-CM

## 2024-09-10 DIAGNOSIS — I447 Left bundle-branch block, unspecified: Secondary | ICD-10-CM

## 2024-09-10 DIAGNOSIS — I428 Other cardiomyopathies: Secondary | ICD-10-CM | POA: Diagnosis not present

## 2024-09-10 MED ORDER — LOSARTAN POTASSIUM 25 MG PO TABS: 12.5000 mg | ORAL_TABLET | Freq: Every day | ORAL | 3 refills | Status: AC

## 2024-09-10 NOTE — Telephone Encounter (Signed)
 PAP: Patient assistance application for Farxiga  has been approved by PAP Companies: [employer] from 11/07/2024 to 11/06/2025. Medication should be delivered to PAP Delivery: Home. For further shipping updates, please contact [employer] ([employer]) at [phone]. Patient ID is: letter in media

## 2024-09-10 NOTE — Patient Instructions (Addendum)
 Medication Instructions:  START Losartan  12.5 mg (1/2 tablet) daily *If you need a refill on your cardiac medications before your next appointment, please call your pharmacy*  Lab Work: BMET in 2 weeks If you have labs (blood work) drawn today and your tests are completely normal, you will receive your results only by: MyChart Message (if you have MyChart) OR A paper copy in the mail If you have any lab test that is abnormal or we need to change your treatment, we will call you to review the results.  Testing/Procedures: NONE  Follow-Up: At [facility], you and your health needs are our priority.  As part of our continuing mission to provide you with exceptional heart care, our providers are all part of one team.  This team includes your primary Cardiologist (physician) and Advanced Practice Providers or APPs (Physician Assistants and Nurse Practitioners) who all work together to provide you with the care you need, when you need it.  Your next appointment:   2-3 Months  Provider:   Maude Emmer, MD or Rollo Louder, PA-C

## 2024-09-13 MED ORDER — TIRZEPATIDE 12.5 MG/0.5ML ~~LOC~~ SOAJ: 12.5000 mg | SUBCUTANEOUS | 1 refills | Status: AC

## 2024-09-23 DIAGNOSIS — Z79899 Other long term (current) drug therapy: Secondary | ICD-10-CM | POA: Diagnosis not present

## 2024-09-24 ENCOUNTER — Ambulatory Visit: Payer: Self-pay | Admitting: Cardiology

## 2024-09-24 LAB — BASIC METABOLIC PANEL WITH GFR
BUN/Creatinine Ratio: 17 (ref 12–28)
BUN: 26 mg/dL (ref 8–27)
CO2: 26 mmol/L (ref 20–29)
Calcium: 9.2 mg/dL (ref 8.7–10.3)
Chloride: 100 mmol/L (ref 96–106)
Creatinine, Ser: 1.51 mg/dL — ABNORMAL HIGH (ref 0.57–1.00)
Glucose: 150 mg/dL — ABNORMAL HIGH (ref 70–99)
Potassium: 4.6 mmol/L (ref 3.5–5.2)
Sodium: 140 mmol/L (ref 134–144)
eGFR: 36 mL/min/1.73 — ABNORMAL LOW (ref >59)

## 2024-09-30 ENCOUNTER — Telehealth: Payer: Self-pay | Admitting: Family Medicine

## 2024-09-30 NOTE — Telephone Encounter (Signed)
 Copied from CRM #[id number]. Topic: Medicare AWV >> Sep 30, 2024 11:32 AM Nathanel DEL wrote: Called LVM 09/30/2024 to sched AWV. Please schedule in office or virtual visit.   Nathanel Paschal; Care Guide Ambulatory Clinical Support [place] [facility] Direct Dial: [phone]

## 2024-10-02 ENCOUNTER — Other Ambulatory Visit: Payer: Self-pay | Admitting: Family Medicine

## 2024-10-02 DIAGNOSIS — E785 Hyperlipidemia, unspecified: Secondary | ICD-10-CM

## 2024-10-04 ENCOUNTER — Other Ambulatory Visit: Payer: Self-pay | Admitting: Family Medicine

## 2024-10-04 DIAGNOSIS — F329 Major depressive disorder, single episode, unspecified: Secondary | ICD-10-CM

## 2024-10-05 ENCOUNTER — Encounter: Payer: Self-pay | Admitting: *Deleted

## 2024-10-15 ENCOUNTER — Other Ambulatory Visit: Payer: Self-pay | Admitting: Student

## 2024-10-15 DIAGNOSIS — R1011 Right upper quadrant pain: Secondary | ICD-10-CM | POA: Diagnosis not present

## 2024-10-24 ENCOUNTER — Inpatient Hospital Stay: Admission: RE | Admit: 2024-10-24 | Discharge: 2024-10-24 | Attending: Student

## 2024-10-24 DIAGNOSIS — R1011 Right upper quadrant pain: Secondary | ICD-10-CM

## 2024-10-25 ENCOUNTER — Telehealth: Payer: Self-pay | Admitting: Pharmacist

## 2024-10-25 NOTE — Progress Notes (Signed)
 Pharmacy Quality Measure Review  This patient is appearing on a report for being at risk of failing the adherence measure for cholesterol (statin) and diabetes medications this calendar year.   Medication: atorvastatin   Last fill date: 06/10/2024 for 90 day supply - New Rx was sent to [facility] on 10/05/2024 but was not filled.   Medication: Mounjaro  10mg  Last fill date: 08/21/2024 for 82 day supply Does of Mounjaro  was increased to 12.5mg  10/05/27 - patient picked up 84 day supply   Left message on patients' VM reminding that she is due to refill atorvastatin  and there is a refill available at her pharmacy.   Madelin Ray, PharmD Clinical Pharmacist [facility] Primary Care  Population Health [phone]

## 2024-11-04 NOTE — Progress Notes (Signed)
 " Cardiology Office Note   Date:  11/14/2024  ID:  Abigail [name], DOB December 25, 1950, MRN [id number] PCP: Watt Harlene BROCKS, MD  [facility] Providers Cardiologist:  Maude Emmer, MD  History of Present Illness Abigail [name] is a 73 y.o. female with a past medical history of anemia, anxiety, depression, CKD, diabetes, GERD, LBBB, hypertension, hyperlipidemia.  Followed by Dr. Emmer.  Presents today for follow-up    Patient was previously evaluated in 08/2017 for evaluation of chest pain.  Underwent coronary CTA 08/18/17 that showed a coronary calcium  score of 0, no evidence of CAD.  Echocardiogram 08/18/17 showed EF 55-60%, no regional wall motion abnormalities, mild MR.   Later underwent nuclear stress test 08/13/2024 showed partially reversible apical to mid anteroseptal/anterior perfusion defect, consistent with infarct with peri-infarct ischemia.  Could possibly represent artifact as there were no coronary calcifications noted on CT.  However, LV systolic function was severely reduced.  Patient then underwent echocardiogram on 08/15/2024 that showed EF 25-30%, grade 2 diastolic dysfunction, normal RV systolic function, mild MR.   Patient underwent left heart catheterization 08/22/2024 that showed no angiographic evidence of CAD, normal LV filling pressures.  There was no indication for further ischemic workup.  I saw her in clinic on 09/10/24. She was overall doing well at that time. She was on farxiga  and proranolol (tremors). Started losartan    Today, patient presents for follow-up appointment.  Tells me that she has been doing very well since last being seen.  Denies chest pain, shortness of breath.  Denies syncope or near syncope.  Denies palpitations.  Denies lower extremity swelling or weight gain.  She remains active by walking her dog about 30 minutes/day.  Able to tolerate activity well.  Is planning to start a workout program through [employer].  Her brother had  previously been diagnosed with Brugada syndrome and has had a defibrillator for the past 13 years.   Studies Reviewed Cardiac Studies & Procedures   ______________________________________________________________________________________________ CARDIAC CATHETERIZATION  CARDIAC CATHETERIZATION 08/22/2024  Conclusion No angiographic evidence of CAD Normal LV filling pressures  No further ischemic workup.  Findings Coronary Findings Diagnostic  Dominance: Right  Left Anterior Descending Vessel is large.  Left Circumflex Vessel is large.  Right Coronary Artery Vessel is large.  Intervention  No interventions have been documented.   STRESS TESTS  MYOCARDIAL PERFUSION IMAGING 08/13/2024  Interpretation Summary   Partially reversible apical to mid anteroseptal/anterior perfusion defect, consistent with infarct with peri-infarct ischemia.  Could represent artifact, as no coronary calcifcations on CT.  However, LV systolic function is severely reduced, EF 26%.  Overall, while area of ischemia is small, study is high risk due to severe systolic dysfunction   Findings are consistent with infarction with peri-infarct ischemia. The study is high risk.   LV perfusion is abnormal. There is evidence of ischemia. There is evidence of infarction. Defect 1: There is a medium defect with moderate reduction in uptake present in the apical to mid anterior and anteroseptal location(s) that is partially reversible. There is abnormal wall motion in the defect area. Consistent with peri-infarct ischemia.   Left ventricular function is abnormal. Global function is severely reduced. Nuclear stress EF: 26%. The left ventricular ejection fraction is severely decreased (<30%). End diastolic cavity size is severely enlarged. End systolic cavity size is severely enlarged.   CT images were obtained for attenuation correction and were examined for the presence of coronary calcium  when appropriate.   Coronary  calcium  was absent on the  attenuation correction CT images.   Prior study not available for comparison.   ECHOCARDIOGRAM  ECHOCARDIOGRAM COMPLETE 08/15/2024  Narrative ECHOCARDIOGRAM REPORT    Patient Name:   Abigail [name] Date of Exam: 08/15/2024 Medical Rec #:  [id number]         Height:       65.0 in Accession #:    [id number]        Weight:       143.0 lb Date of Birth:  [birth year]        BSA:          1.715 m Patient Age:    72 years          BP:           104/65 mmHg Patient Gender: F                 HR:           82 bpm. Exam Location:  [place]  Procedure: 2D Echo, Intracardiac Opacification Agent, Cardiac Doppler and Color Doppler (Both Spectral and Color Flow Doppler were utilized during procedure).  Indications:    R94.39 Abnormal Nuclear Stress Test  History:        Patient has no prior history of Echocardiogram examinations. Arrythmias:LBBB, Signs/Symptoms:Chest Pain; Risk Factors:Hypertension, Diabetes, Dyslipidemia and Non-Smoker.  Sonographer:    Gordon Miyamoto Referring Phys: [id number] MAUDE JAYSON EMMER   Sonographer Comments: Technically difficult study due to poor echo windows. reduced lvef IMPRESSIONS   1. Left ventricular ejection fraction, by estimation, is 25 to 30%. Left ventricular ejection fraction by PLAX is 18 %. The left ventricle has severely decreased function. The left ventricle demonstrates global hypokinesis. Left ventricular diastolic parameters are consistent with Grade II diastolic dysfunction (pseudonormalization). 2. Right ventricular systolic function is normal. The right ventricular size is normal. Tricuspid regurgitation signal is inadequate for assessing PA pressure. 3. The mitral valve is normal in structure. Mild mitral valve regurgitation. No evidence of mitral stenosis. 4. The aortic valve is tricuspid. Aortic valve regurgitation is not visualized. No aortic stenosis is present. 5. The inferior vena cava is normal in size with greater  than 50% respiratory variability, suggesting right atrial pressure of 3 mmHg.  FINDINGS Left Ventricle: Left ventricular ejection fraction, by estimation, is 25 to 30%. Left ventricular ejection fraction by PLAX is 18 %. The left ventricle has severely decreased function. The left ventricle demonstrates global hypokinesis. Definity  contrast agent was given IV to delineate the left ventricular endocardial borders. Strain was performed and the global longitudinal strain is indeterminate. The left ventricular internal cavity size was normal in size. There is no left ventricular hypertrophy. Left ventricular diastolic parameters are consistent with Grade II diastolic dysfunction (pseudonormalization).  Right Ventricle: The right ventricular size is normal. No increase in right ventricular wall thickness. Right ventricular systolic function is normal. Tricuspid regurgitation signal is inadequate for assessing PA pressure.  Left Atrium: Left atrial size was normal in size.  Right Atrium: Right atrial size was normal in size.  Pericardium: There is no evidence of pericardial effusion.  Mitral Valve: The mitral valve is normal in structure. Mild mitral valve regurgitation. No evidence of mitral valve stenosis.  Tricuspid Valve: The tricuspid valve is normal in structure. Tricuspid valve regurgitation is not demonstrated. No evidence of tricuspid stenosis.  Aortic Valve: The aortic valve is tricuspid. Aortic valve regurgitation is not visualized. No aortic stenosis is present. Aortic valve mean gradient measures 2.0 mmHg. Aortic valve peak gradient measures  4.1 mmHg. Aortic valve area, by VTI measures 2.16 cm.  Pulmonic Valve: The pulmonic valve was normal in structure. Pulmonic valve regurgitation is not visualized. No evidence of pulmonic stenosis.  Aorta: The aortic root is normal in size and structure.  Venous: The inferior vena cava is normal in size with greater than 50% respiratory  variability, suggesting right atrial pressure of 3 mmHg.  IAS/Shunts: No atrial level shunt detected by color flow Doppler.  Additional Comments: 3D was performed not requiring image post processing on an independent workstation and was indeterminate.   LEFT VENTRICLE PLAX 2D LV EF:         Left            Diastology ventricular     LV e' medial:    4.90 cm/s ejection        LV E/e' medial:  20.6 fraction by     LV e' lateral:   5.77 cm/s PLAX is 18      LV E/e' lateral: 17.5 %. LVIDd:         4.90 cm LVIDs:         4.50 cm LV PW:         0.80 cm LV IVS:        0.90 cm LVOT diam:     2.00 cm LV SV:         43 LV SV Index:   25 LVOT Area:     3.14 cm  LV Volumes (MOD) LV vol d, MOD    39.3 ml A2C: LV vol d, MOD    96.1 ml A4C: LV vol s, MOD    34.8 ml A2C: LV vol s, MOD    62.1 ml A4C: LV SV MOD A2C:   4.5 ml LV SV MOD A4C:   96.1 ml LV SV MOD BP:    37.8 ml  RIGHT VENTRICLE RV Basal diam:  2.10 cm RV Mid diam:    1.50 cm RV S prime:     12.00 cm/s TAPSE (M-mode): 1.7 cm  LEFT ATRIUM             Index        RIGHT ATRIUM          Index LA diam:        2.70 cm 1.57 cm/m   RA Area:     8.76 cm LA Vol (A2C):   29.4 ml 17.14 ml/m  RA Volume:   13.80 ml 8.05 ml/m LA Vol (A4C):   18.8 ml 10.96 ml/m LA Biplane Vol: 24.5 ml 14.28 ml/m AORTIC VALVE AV Area (Vmax):    1.99 cm AV Area (Vmean):   1.92 cm AV Area (VTI):     2.16 cm AV Vmax:           101.00 cm/s AV Vmean:          67.800 cm/s AV VTI:            0.199 m AV Peak Grad:      4.1 mmHg AV Mean Grad:      2.0 mmHg LVOT Vmax:         64.00 cm/s LVOT Vmean:        41.500 cm/s LVOT VTI:          0.137 m LVOT/AV VTI ratio: 0.69  AORTA Ao Sinus diam: 2.30 cm Ao Asc diam:   2.50 cm  MITRAL VALVE MV Area (PHT): 7.74 cm     SHUNTS  MV Decel Time: 98 msec      Systemic VTI:  0.14 m MV E velocity: 101.00 cm/s  Systemic Diam: 2.00 cm MV A velocity: 43.50 cm/s MV E/A ratio:  2.32  Evalene Lunger  MD Electronically signed by Evalene Lunger MD Signature Date/Time: 08/15/2024/6:50:20 PM    Final          ______________________________________________________________________________________________      Risk Assessment/Calculations           Physical Exam VS:  BP 110/68   Pulse 83   Ht 5' 5 (1.651 m)   Wt 143 lb 9.6 oz (65.1 kg)   SpO2 96%   BMI 23.90 kg/m        Wt Readings from Last 3 Encounters:  11/14/24 143 lb 9.6 oz (65.1 kg)  09/10/24 140 lb 6.4 oz (63.7 kg)  08/28/24 140 lb 9.6 oz (63.8 kg)    GEN: Well nourished, well developed in no acute distress. Sitting comfortably in the chair  NECK: No JVD  CARDIAC:  RRR, no murmurs, rubs, gallops. Radial pulses 2+ bilaterally  RESPIRATORY:  Clear to auscultation without rales, wheezing or rhonchi. Normal WOB on room air   ABDOMEN: Soft, non-tender, non-distended EXTREMITIES:  No edema in BLE; No deformity   ASSESSMENT AND PLAN  NICM LBBB - Echocardiogram 10/9 showed EF 25-30%, grade 2 diastolic dysfunction, normal RV systolic function, mild MR - Left heart catheterization 08/22/2024 showed no angiographic evidence of CAD, normal LV filling pressures - Etiology of heart failure currently unknown.  Patient does have LBBB on EKG with QRS duration 130 ms. Possible that this could be contributing. No history of heavy alcohol use or drug use. BP has been well controlled.   - Patient is euvolemic on exam. Denies DOE, orthopnea, ankle edema.  - Continue Farxiga  10 mg daily   - Patient currently on propranolol  60 mg daily for tremors. This is prescribed by her PCP. OK to continue  - Continue losartan  12.5 mg daily  - Start spironolactone 12.5 mg daily  - Creatinine stable at 1.51, K 4.6 on 09/23/24. Ordered BMP in 2 weeks  - After 3 months on GDMT, plan for cMRI. If EF remains reduced, consider EP referral    HTN  - BP well controlled  - Continue propranolol  60 mg daily (on for tremors) and losartan  12.5 mg daily,  and start spironolactone 12.5 mg daily as above    HLD  - Lipid panel from 08/2024 showed LDL 59, HDL 54, triglycerides 108, total cholesterol 162  - continue lipitor 20 mg daily    Type 2 DM  - Followed by PCP. A1c 7.5 in 08/2024  - Continue farxiga , mounjaro     Dispo: Follow up in 4 months with me   Signed, Rollo FABIENE Louder, PA-C   "

## 2024-11-14 ENCOUNTER — Encounter: Payer: Self-pay | Admitting: Cardiology

## 2024-11-14 ENCOUNTER — Ambulatory Visit: Attending: Cardiology | Admitting: Cardiology

## 2024-11-14 VITALS — BP 110/68 | HR 83 | Ht 65.0 in | Wt 143.6 lb

## 2024-11-14 DIAGNOSIS — Z79899 Other long term (current) drug therapy: Secondary | ICD-10-CM

## 2024-11-14 DIAGNOSIS — I447 Left bundle-branch block, unspecified: Secondary | ICD-10-CM | POA: Diagnosis not present

## 2024-11-14 DIAGNOSIS — I428 Other cardiomyopathies: Secondary | ICD-10-CM

## 2024-11-14 DIAGNOSIS — E119 Type 2 diabetes mellitus without complications: Secondary | ICD-10-CM

## 2024-11-14 DIAGNOSIS — I10 Essential (primary) hypertension: Secondary | ICD-10-CM | POA: Diagnosis not present

## 2024-11-14 NOTE — Patient Instructions (Signed)
 Medication Instructions:  Your physician recommends that you continue on your current medications as directed. Please refer to the Current Medication list given to you today.  *If you need a refill on your cardiac medications before your next appointment, please call your pharmacy*  Lab Work: BMP in 2 weeks. If you have labs (blood work) drawn today and your tests are completely normal, you will receive your results only by: MyChart Message (if you have MyChart) OR A paper copy in the mail If you have any lab test that is abnormal or we need to change your treatment, we will call you to review the results.  Testing/Procedures: Cardiac MRI Your physician has requested that you have a cardiac MRI. Cardiac MRI uses a computer to create images of your heart as its beating, producing both still and moving pictures of your heart and major blood vessels. For further information please visit [email or web address]. Please follow the instruction sheet given to you today for more information.   Follow-Up: At [facility], you and your health needs are our priority.  As part of our continuing mission to provide you with exceptional heart care, our providers are all part of one team.  This team includes your primary Cardiologist (physician) and Advanced Practice Providers or APPs (Physician Assistants and Nurse Practitioners) who all work together to provide you with the care you need, when you need it.  Your next appointment:   4 month(s)  Provider:   Rollo Louder, PA-C          We recommend signing up for the patient portal called MyChart.  Sign up information is provided on this After Visit Summary.  MyChart is used to connect with patients for Virtual Visits (Telemedicine).  Patients are able to view lab/test results, encounter notes, upcoming appointments, etc.  Non-urgent messages can be sent to your provider as well.   To learn more about what you can do with MyChart, go to  [email or web address].   Other Instructions None

## 2024-11-28 ENCOUNTER — Encounter (HOSPITAL_COMMUNITY): Payer: Self-pay

## 2024-11-28 LAB — BASIC METABOLIC PANEL WITH GFR
BUN/Creatinine Ratio: 17 (ref 12–28)
BUN: 24 mg/dL (ref 8–27)
CO2: 22 mmol/L (ref 20–29)
Calcium: 9.1 mg/dL (ref 8.7–10.3)
Chloride: 101 mmol/L (ref 96–106)
Creatinine, Ser: 1.41 mg/dL — ABNORMAL HIGH (ref 0.57–1.00)
Glucose: 125 mg/dL — ABNORMAL HIGH (ref 70–99)
Potassium: 4.5 mmol/L (ref 3.5–5.2)
Sodium: 139 mmol/L (ref 134–144)
eGFR: 39 mL/min/1.73 — ABNORMAL LOW

## 2024-11-29 ENCOUNTER — Other Ambulatory Visit: Payer: Self-pay | Admitting: Cardiology

## 2024-11-29 ENCOUNTER — Ambulatory Visit: Payer: Self-pay | Admitting: Cardiology

## 2024-11-29 ENCOUNTER — Ambulatory Visit (HOSPITAL_COMMUNITY)
Admission: RE | Admit: 2024-11-29 | Discharge: 2024-11-29 | Disposition: A | Source: Ambulatory Visit | Attending: Cardiology

## 2024-11-29 DIAGNOSIS — I428 Other cardiomyopathies: Secondary | ICD-10-CM | POA: Diagnosis present

## 2024-11-29 MED ORDER — GADOBUTROL 1 MMOL/ML IV SOLN
9.5000 mL | Freq: Once | INTRAVENOUS | Status: AC | PRN
  Administered 2024-11-29: 9.5 mL via INTRAVENOUS

## 2024-12-03 ENCOUNTER — Ambulatory Visit: Payer: Self-pay | Admitting: Cardiology

## 2024-12-03 DIAGNOSIS — I447 Left bundle-branch block, unspecified: Secondary | ICD-10-CM

## 2024-12-13 ENCOUNTER — Telehealth: Payer: Self-pay | Admitting: Family Medicine

## 2024-12-13 NOTE — Telephone Encounter (Signed)
 Called her and discussed her cardiac MRI She is feeling fine.  She will see EP

## 2024-12-13 NOTE — Telephone Encounter (Signed)
-----   Message from Harlene Schroeder, MD sent at 12/12/2024  4:26 PM EST ----- Call her re her heart issues

## 2025-02-12 ENCOUNTER — Ambulatory Visit: Admitting: Cardiology
